# Patient Record
Sex: Male | Born: 1935 | Race: White | Hispanic: No | Marital: Married | Smoking: Former smoker
Health system: Southern US, Community
[De-identification: ages and names within clinical notes are randomized; demographics above are authoritative.]

## PROBLEM LIST (undated history)

## (undated) DIAGNOSIS — G4730 Sleep apnea, unspecified: Secondary | ICD-10-CM

## (undated) DIAGNOSIS — I219 Acute myocardial infarction, unspecified: Secondary | ICD-10-CM

## (undated) DIAGNOSIS — M1990 Unspecified osteoarthritis, unspecified site: Secondary | ICD-10-CM

## (undated) DIAGNOSIS — E039 Hypothyroidism, unspecified: Secondary | ICD-10-CM

## (undated) DIAGNOSIS — J9610 Chronic respiratory failure, unspecified whether with hypoxia or hypercapnia: Secondary | ICD-10-CM

## (undated) DIAGNOSIS — I5042 Chronic combined systolic (congestive) and diastolic (congestive) heart failure: Secondary | ICD-10-CM

## (undated) DIAGNOSIS — I447 Left bundle-branch block, unspecified: Secondary | ICD-10-CM

## (undated) DIAGNOSIS — Z8719 Personal history of other diseases of the digestive system: Secondary | ICD-10-CM

## (undated) DIAGNOSIS — J45909 Unspecified asthma, uncomplicated: Secondary | ICD-10-CM

## (undated) DIAGNOSIS — E785 Hyperlipidemia, unspecified: Secondary | ICD-10-CM

## (undated) DIAGNOSIS — I2510 Atherosclerotic heart disease of native coronary artery without angina pectoris: Secondary | ICD-10-CM

## (undated) DIAGNOSIS — I472 Ventricular tachycardia: Secondary | ICD-10-CM

## (undated) DIAGNOSIS — J449 Chronic obstructive pulmonary disease, unspecified: Secondary | ICD-10-CM

## (undated) DIAGNOSIS — J439 Emphysema, unspecified: Secondary | ICD-10-CM

## (undated) DIAGNOSIS — I255 Ischemic cardiomyopathy: Secondary | ICD-10-CM

## (undated) DIAGNOSIS — K529 Noninfective gastroenteritis and colitis, unspecified: Secondary | ICD-10-CM

## (undated) DIAGNOSIS — I10 Essential (primary) hypertension: Secondary | ICD-10-CM

## (undated) HISTORY — DX: Unspecified asthma, uncomplicated: J45.909

## (undated) HISTORY — DX: Sleep apnea, unspecified: G47.30

## (undated) HISTORY — PX: CATARACT EXTRACTION: SUR2

## (undated) HISTORY — PX: INSERT / REPLACE / REMOVE PACEMAKER: SUR710

## (undated) HISTORY — DX: Ischemic cardiomyopathy: I25.5

## (undated) HISTORY — DX: Unspecified osteoarthritis, unspecified site: M19.90

## (undated) HISTORY — DX: Hyperlipidemia, unspecified: E78.5

## (undated) HISTORY — DX: Emphysema, unspecified: J43.9

## (undated) HISTORY — PX: CARDIAC CATHETERIZATION: SHX172

## (undated) HISTORY — DX: Acute myocardial infarction, unspecified: I21.9

## (undated) HISTORY — DX: Chronic obstructive pulmonary disease, unspecified: J44.9

## (undated) HISTORY — DX: Essential (primary) hypertension: I10

## (undated) HISTORY — DX: Left bundle-branch block, unspecified: I44.7

---

## 1993-01-17 DIAGNOSIS — I219 Acute myocardial infarction, unspecified: Secondary | ICD-10-CM

## 1993-01-17 HISTORY — DX: Acute myocardial infarction, unspecified: I21.9

## 1993-01-17 HISTORY — PX: CORONARY ARTERY BYPASS GRAFT: SHX141

## 2000-01-18 HISTORY — PX: PACEMAKER INSERTION: SHX728

## 2007-01-18 HISTORY — PX: TOTAL KNEE ARTHROPLASTY: SHX125

## 2011-01-18 HISTORY — PX: REVERSE SHOULDER ARTHROPLASTY: SHX5054

## 2012-01-18 HISTORY — PX: REVERSE TOTAL SHOULDER ARTHROPLASTY: SHX2344

## 2014-06-05 LAB — CBC AND DIFFERENTIAL
HCT: 30 % — AB (ref 41–53)
Hemoglobin: 9.9 g/dL — AB (ref 13.5–17.5)
NEUTROS ABS: 4 /uL
Platelets: 192 10*3/uL (ref 150–399)
WBC: 6.8 10^3/mL

## 2014-06-23 LAB — CBC AND DIFFERENTIAL
HEMATOCRIT: 34 % — AB (ref 41–53)
HEMOGLOBIN: 11.1 g/dL — AB (ref 13.5–17.5)
Neutrophils Absolute: 5 /uL
PLATELETS: 204 10*3/uL (ref 150–399)
WBC: 7.2 10*3/mL

## 2014-08-29 LAB — BASIC METABOLIC PANEL
BUN: 31 mg/dL — AB (ref 4–21)
Creatinine: 1.4 mg/dL — AB (ref 0.6–1.3)
Glucose: 88 mg/dL
Potassium: 4.2 mmol/L (ref 3.4–5.3)
Sodium: 134 mmol/L — AB (ref 137–147)

## 2014-08-29 LAB — CBC AND DIFFERENTIAL
HEMATOCRIT: 28 % — AB (ref 41–53)
HEMOGLOBIN: 9.1 g/dL — AB (ref 13.5–17.5)
Platelets: 123 10*3/uL — AB (ref 150–399)

## 2014-09-05 LAB — BASIC METABOLIC PANEL
BUN: 31 mg/dL — AB (ref 4–21)
CREATININE: 1.4 mg/dL — AB (ref 0.6–1.3)
GLUCOSE: 88 mg/dL
POTASSIUM: 4.2 mmol/L (ref 3.4–5.3)
SODIUM: 134 mmol/L — AB (ref 137–147)

## 2014-10-20 ENCOUNTER — Encounter: Payer: Self-pay | Admitting: Family Medicine

## 2014-10-20 ENCOUNTER — Ambulatory Visit (INDEPENDENT_AMBULATORY_CARE_PROVIDER_SITE_OTHER): Payer: Medicare Other | Admitting: Family Medicine

## 2014-10-20 VITALS — BP 102/60 | HR 75 | Temp 98.0°F | Ht 65.75 in | Wt 165.5 lb

## 2014-10-20 DIAGNOSIS — M069 Rheumatoid arthritis, unspecified: Secondary | ICD-10-CM

## 2014-10-20 DIAGNOSIS — E039 Hypothyroidism, unspecified: Secondary | ICD-10-CM

## 2014-10-20 DIAGNOSIS — K625 Hemorrhage of anus and rectum: Secondary | ICD-10-CM | POA: Diagnosis not present

## 2014-10-20 DIAGNOSIS — Z23 Encounter for immunization: Secondary | ICD-10-CM

## 2014-10-20 DIAGNOSIS — M810 Age-related osteoporosis without current pathological fracture: Secondary | ICD-10-CM

## 2014-10-20 DIAGNOSIS — I10 Essential (primary) hypertension: Secondary | ICD-10-CM

## 2014-10-20 DIAGNOSIS — Z0000 Encounter for general adult medical examination without abnormal findings: Secondary | ICD-10-CM

## 2014-10-20 DIAGNOSIS — G4733 Obstructive sleep apnea (adult) (pediatric): Secondary | ICD-10-CM

## 2014-10-20 DIAGNOSIS — K219 Gastro-esophageal reflux disease without esophagitis: Secondary | ICD-10-CM

## 2014-10-20 DIAGNOSIS — E785 Hyperlipidemia, unspecified: Secondary | ICD-10-CM | POA: Diagnosis not present

## 2014-10-20 DIAGNOSIS — N183 Chronic kidney disease, stage 3 unspecified: Secondary | ICD-10-CM

## 2014-10-20 DIAGNOSIS — I2510 Atherosclerotic heart disease of native coronary artery without angina pectoris: Secondary | ICD-10-CM

## 2014-10-20 DIAGNOSIS — J449 Chronic obstructive pulmonary disease, unspecified: Secondary | ICD-10-CM

## 2014-10-20 DIAGNOSIS — M1000 Idiopathic gout, unspecified site: Secondary | ICD-10-CM

## 2014-10-20 DIAGNOSIS — E663 Overweight: Secondary | ICD-10-CM

## 2014-10-20 LAB — LIPID PANEL
CHOLESTEROL: 91 mg/dL (ref 0–200)
HDL: 40 mg/dL (ref >39.00)
LDL CALC: 28 mg/dL (ref 0–99)
NonHDL: 50.61
TRIGLYCERIDES: 112 mg/dL (ref 0.0–149.0)
Total CHOL/HDL Ratio: 2
VLDL: 22.4 mg/dL (ref 0.0–40.0)

## 2014-10-20 LAB — COMPREHENSIVE METABOLIC PANEL
ALBUMIN: 3.8 g/dL (ref 3.5–5.2)
ALK PHOS: 52 U/L (ref 39–117)
ALT: 23 U/L (ref 0–53)
AST: 29 U/L (ref 0–37)
BUN: 34 mg/dL — ABNORMAL HIGH (ref 6–23)
CO2: 29 mEq/L (ref 19–32)
Calcium: 9.8 mg/dL (ref 8.4–10.5)
Chloride: 101 mEq/L (ref 96–112)
Creatinine, Ser: 1.45 mg/dL (ref 0.40–1.50)
GFR: 49.77 mL/min — AB (ref >60.00)
Glucose, Bld: 106 mg/dL — ABNORMAL HIGH (ref 70–99)
POTASSIUM: 4.4 meq/L (ref 3.5–5.1)
Sodium: 139 mEq/L (ref 135–145)
TOTAL PROTEIN: 7 g/dL (ref 6.0–8.3)
Total Bilirubin: 0.4 mg/dL (ref 0.2–1.2)

## 2014-10-20 LAB — HEMOGLOBIN A1C: HEMOGLOBIN A1C: 5.5 % (ref 4.6–6.5)

## 2014-10-20 LAB — CBC
HCT: 31.3 % — ABNORMAL LOW (ref 39.0–52.0)
Hemoglobin: 10.1 g/dL — ABNORMAL LOW (ref 13.0–17.0)
MCHC: 32.4 g/dL (ref 30.0–36.0)
MCV: 96.7 fl (ref 78.0–100.0)
Platelets: 243 10*3/uL (ref 150.0–400.0)
RBC: 3.24 Mil/uL — AB (ref 4.22–5.81)
RDW: 15 % (ref 11.5–15.5)
WBC: 7.3 10*3/uL (ref 4.0–10.5)

## 2014-10-20 MED ORDER — FLUTICASONE-SALMETEROL 250-50 MCG/DOSE IN AEPB: 1.0000 | INHALATION_SPRAY | Freq: Two times a day (BID) | RESPIRATORY_TRACT | Status: DC

## 2014-10-20 NOTE — Progress Notes (Signed)
Pre visit review using our clinic review tool, if applicable. No additional management support is needed unless otherwise documented below in the visit note. 

## 2014-10-20 NOTE — Patient Instructions (Addendum)
It was nice to see you today.  We will be in contact regarding a referral to pulmonology and cardiology.  Please continue your current medications other than the Flovent. I have changed this to Advair.  Follow up in ~ 1-3 months.  Take care  Dr. Adriana Simas

## 2014-10-21 ENCOUNTER — Encounter: Payer: Self-pay | Admitting: *Deleted

## 2014-10-21 ENCOUNTER — Ambulatory Visit
Admission: RE | Admit: 2014-10-21 | Discharge: 2014-10-21 | Disposition: A | Discharge status: Home / Self Care | Payer: Medicare Other | Source: Ambulatory Visit | Attending: Cardiovascular Disease | Admitting: Cardiovascular Disease

## 2014-10-21 ENCOUNTER — Encounter: Payer: Self-pay | Admitting: Cardiovascular Disease

## 2014-10-21 ENCOUNTER — Ambulatory Visit (INDEPENDENT_AMBULATORY_CARE_PROVIDER_SITE_OTHER): Payer: Medicare Other | Admitting: Cardiovascular Disease

## 2014-10-21 VITALS — BP 84/54 | HR 70 | Ht 66.0 in | Wt 167.5 lb

## 2014-10-21 DIAGNOSIS — Z0000 Encounter for general adult medical examination without abnormal findings: Secondary | ICD-10-CM | POA: Insufficient documentation

## 2014-10-21 DIAGNOSIS — I2510 Atherosclerotic heart disease of native coronary artery without angina pectoris: Secondary | ICD-10-CM | POA: Diagnosis not present

## 2014-10-21 DIAGNOSIS — N183 Chronic kidney disease, stage 3 unspecified: Secondary | ICD-10-CM | POA: Insufficient documentation

## 2014-10-21 DIAGNOSIS — R0602 Shortness of breath: Secondary | ICD-10-CM | POA: Insufficient documentation

## 2014-10-21 DIAGNOSIS — I509 Heart failure, unspecified: Secondary | ICD-10-CM | POA: Insufficient documentation

## 2014-10-21 DIAGNOSIS — I255 Ischemic cardiomyopathy: Secondary | ICD-10-CM | POA: Insufficient documentation

## 2014-10-21 DIAGNOSIS — E785 Hyperlipidemia, unspecified: Secondary | ICD-10-CM

## 2014-10-21 DIAGNOSIS — I10 Essential (primary) hypertension: Secondary | ICD-10-CM | POA: Diagnosis not present

## 2014-10-21 DIAGNOSIS — K219 Gastro-esophageal reflux disease without esophagitis: Secondary | ICD-10-CM | POA: Insufficient documentation

## 2014-10-21 DIAGNOSIS — K625 Hemorrhage of anus and rectum: Secondary | ICD-10-CM | POA: Insufficient documentation

## 2014-10-21 DIAGNOSIS — J449 Chronic obstructive pulmonary disease, unspecified: Secondary | ICD-10-CM

## 2014-10-21 DIAGNOSIS — D500 Iron deficiency anemia secondary to blood loss (chronic): Secondary | ICD-10-CM

## 2014-10-21 DIAGNOSIS — M069 Rheumatoid arthritis, unspecified: Secondary | ICD-10-CM | POA: Insufficient documentation

## 2014-10-21 DIAGNOSIS — E039 Hypothyroidism, unspecified: Secondary | ICD-10-CM | POA: Insufficient documentation

## 2014-10-21 DIAGNOSIS — M109 Gout, unspecified: Secondary | ICD-10-CM | POA: Insufficient documentation

## 2014-10-21 DIAGNOSIS — M810 Age-related osteoporosis without current pathological fracture: Secondary | ICD-10-CM | POA: Insufficient documentation

## 2014-10-21 DIAGNOSIS — I951 Orthostatic hypotension: Secondary | ICD-10-CM

## 2014-10-21 NOTE — Assessment & Plan Note (Addendum)
Lipid Panel     Component Value Date/Time   CHOL 91 10/20/2014 1448   TRIG 112.0 10/20/2014 1448   HDL 40.00 10/20/2014 1448   CHOLHDL 2 10/20/2014 1448   VLDL 22.4 10/20/2014 1448   LDLCALC 28 10/20/2014 1448   Will continue current therapy: Simvastatin, Zetia. Will discuss the escalating therapy at follow-up visit if cholesterol continues to be low.

## 2014-10-21 NOTE — Assessment & Plan Note (Signed)
Patient has had recent hospitalization and workup on 09/25/14. He had flex sig which revealed internal & external hemorrhoids. No other abnormalities.  He did have a bleeding internal hemorrhoid which was cauterized.  Hemoglobin was stable. He was discharged home in stable condition.  He reports he's had one episode of rectal bleeding on Saturday which quickly resolved. CBC was obtained and hemoglobin was stable at 10.1 (was 10.3 per daughter while in Hospital).

## 2014-10-21 NOTE — Assessment & Plan Note (Signed)
Suggested he talk with Dr. Adriana Simas. He may want to start iron supplementation given his chronic blood loss from hemorrhoids. Bleeding has been notable over the past week. Suspect his anemia may be contributing to some of his weakness, shortness of breath

## 2014-10-21 NOTE — Assessment & Plan Note (Signed)
Patient with known COPD. Reports a one-year history of increasing dyspnea particularly with exertion. He appears euvolemic on exam today making underlying heart failure less likely etiology. Advised him to continue Spiriva and Singulair. Changing Flovent to Advair.

## 2014-10-21 NOTE — Progress Notes (Signed)
Patient ID: Tom Nguyen, male    DOB: 29-Jan-1932, 81 y.o.   MRN: [id number]  HPI Comments: Tom Nguyen is a 81 year old gentleman with long history of smoking starting at age 35, stopped in 1995, coronary artery disease, bypass surgery in 1995, ischemic cardiomyopathy, sleep apnea with CPAP, COPD, ICD 3 leads, underlying left bundle branch block with ejection fraction 28% who presents to establish care in the [facility].  He presents with wife and daughter on today's visit. They report he has had increasing shortness of breath since the end of August, beginning of September 79. There report he has no energy to go walking as he used to do. He reports having more shortness of breath, general weakness, fatigue. Also with anorexia, weight loss. Recent problems with hemorrhoids, most recent hematocrit 31. He was placed in the [place] in [place] and had hemorrhoids cauterized. He reports bleeding is back for the past month. He reports bowel movements are generally soft, takes Benefiber. He does report recent symptoms of near syncope while on the toilet having a bowel movement.  He has a [employer], political party. Notes provided shows ejection fraction by echocardiogram October 2010 was 35%. Ejection fraction in 2007 was 40% Stress test July 2006 showed inferior posterior lateral infarction with peri-infarct ischemia, ejection fraction 28%. Akinesis of the inferior myocardial segment and hypokinesis of the inferolateral segment. Prior study July 2004, no change  Notes indicate creatinine 1.65 in November 2015 with GFR 39 BNP 07/28/2014 was 200 EKG on today's visit shows a sense, V pace at 70 bpm  Other past medical history He does report that he was previously on warfarin. He does not take aspirin Family is unaware why he was on the warfarin in the past. They deny any atrial fibrillation or other arrhythmia. He had significant bruising, bleeding. Warfarin was held He reports he has not been  on aspirin for a long time but does not know why. Possibly from bleeding. Notes also indicate GI bleeding dates back to 2011. Perhaps this is why his anticoagulation was held      No Known Allergies  Current Outpatient Prescriptions on File Prior to Visit  Medication Sig Dispense Refill  . acetaminophen (TYLENOL) 500 MG tablet Take 500 mg by mouth every 6 (six) hours as needed.    Marland Kitchen alendronate (FOSAMAX) 70 MG tablet Take 500 mg by mouth.     Marland Kitchen allopurinol (ZYLOPRIM) 100 MG tablet Take 100 mg by mouth daily.     . carvedilol (COREG) 25 MG tablet Take 25 mg by mouth 2 (two) times daily with a meal.     . eplerenone (INSPRA) 50 MG tablet Take 50 mg by mouth daily.     . Fluticasone-Salmeterol (ADVAIR DISKUS) 250-50 MCG/DOSE AEPB Inhale 1 puff into the lungs 2 (two) times daily. 60 each 3  . folic acid (FOLVITE) 1 MG tablet Take 1 mg by mouth daily.     . furosemide (LASIX) 80 MG tablet Take 80 mg by mouth daily.     . hydroxychloroquine (PLAQUENIL) 200 MG tablet Take 200 mg by mouth 2 (two) times daily.     Marland Kitchen leflunomide (ARAVA) 20 MG tablet Take 20 mg by mouth daily.     Marland Kitchen levothyroxine (SYNTHROID, LEVOTHROID) 75 MCG tablet Take 75 mcg by mouth daily before breakfast.     . lisinopril (PRINIVIL,ZESTRIL) 40 MG tablet Take 40 mg by mouth daily.     . montelukast (SINGULAIR) 10 MG tablet Take 10 mg by mouth  at bedtime.     . Multiple Vitamins-Minerals (MULTIVITAMIN ADULT PO) Take by mouth daily.     . nitroGLYCERIN (NITROLINGUAL) 0.4 MG/SPRAY spray Place 1 spray under the tongue every 5 (five) minutes x 3 doses as needed for chest pain.    Marland Kitchen omeprazole (PRILOSEC) 20 MG capsule Take 20 mg by mouth daily.     . simvastatin (ZOCOR) 40 MG tablet Take 40 mg by mouth daily.     Marland Kitchen SPIRIVA HANDIHALER 18 MCG inhalation capsule Place 18 mcg into inhaler and inhale daily.     Marland Kitchen ZETIA 10 MG tablet Take 10 mg by mouth daily.      No current facility-administered medications on file prior to visit.     Past Medical History  Diagnosis Date  . Asthma   . Arthritis   . Blood in stool   . Emphysema of lung (HCC)   . Hypertension   . Hyperlipidemia   . Heart disease   . Thyroid disease   . Ischemic cardiomyopathy   . CHF (congestive heart failure) (HCC)   . LBBB (left bundle branch block)   . COPD (chronic obstructive pulmonary disease) (HCC)   . Sleep apnea   . MI (myocardial infarction) (HCC) 1995    Past Surgical History  Procedure Laterality Date  . Coronary artery bypass graft  1995    5 vessels  . Pacemaker insertion  2002  . Reverse shoulder arthroplasty Right 2013  . Reverse total shoulder arthroplasty Left 2014  . Total knee arthroplasty  2009    both knees  . Cardiac catheterization      Social History  reports that he quit smoking about 21 years ago. He has never used smokeless tobacco. He reports that he drinks alcohol. He reports that he does not use illicit drugs.  Family History family history includes Hyperlipidemia in his father; Hypertension in his father; Kidney disease in his father; Stroke in his father.   Review of Systems  Constitutional: Positive for fatigue and unexpected weight change.  Respiratory: Positive for shortness of breath.   Cardiovascular: Negative.   Gastrointestinal: Negative.   Musculoskeletal: Positive for gait problem.  Neurological: Positive for weakness.  Hematological: Negative.   Psychiatric/Behavioral: Negative.   All other systems reviewed and are negative.   BP 84/54 mmHg  Pulse 70  Ht 5\' 6"  (1.676 m)  Wt 167 lb 8 oz (75.978 kg)  BMI 27.05 kg/m2  SpO2 93%  Physical Exam  Constitutional: He is oriented to person, place, and time. He appears well-developed and well-nourished.  Pale-appearing gentleman in no distress, breathing comfortably  HENT:  Head: Normocephalic.  Nose: Nose normal.  Mouth/Throat: Oropharynx is clear and moist.  Eyes: Conjunctivae are normal. Pupils are equal, round, and reactive to  light.  Neck: Normal range of motion. Neck supple. No JVD present.  Cardiovascular: Normal rate, regular rhythm, normal heart sounds and intact distal pulses.  Exam reveals no gallop and no friction rub.   No murmur heard. Pulmonary/Chest: Effort normal. No respiratory distress. He has decreased breath sounds. He has no wheezes. He has no rales. He exhibits no tenderness.  Abdominal: Soft. Bowel sounds are normal. He exhibits no distension. There is no tenderness.  Musculoskeletal: Normal range of motion. He exhibits no edema or tenderness.  Lymphadenopathy:    He has no cervical adenopathy.  Neurological: He is alert and oriented to person, place, and time. Coordination normal.  Skin: Skin is warm and dry. No rash noted. No  erythema.  Psychiatric: He has a normal mood and affect. His behavior is normal. Judgment and thought content normal.

## 2014-10-21 NOTE — Assessment & Plan Note (Signed)
T dap and flu given today

## 2014-10-21 NOTE — Assessment & Plan Note (Signed)
Bleeding over the past week likely exacerbating his anemia. Mild to moderately low at 31 Will defer management to primary care. Unclear if he needs to be seen by GI for repeat banding Notes indicate GI bleeding has been a chronic issue dating back to 2011 at which time he had EGDs in the hospital

## 2014-10-21 NOTE — Assessment & Plan Note (Signed)
Etiology of his shortness of breath is unclear, likely multifactorial. Symptoms will be made worse by anemia, most recent hematocrit 31 in the setting of hemorrhoids and rectal bleeding over the past week. In the setting of underlying severe COPD, he'll be more symptomatic. Given elevated BUN and creatinine, clinical exam with no edema or abdominal bloating, less likely acute on chronic systolic CHF. With that said, if symptoms get worse, we have recommended he take extra Lasix after lunch on a periodic basis. We have ordered a chest x-ray today to rule out pleural effusion (some concern on the left base on clinical exam).  Recommended follow-up with pulmonology or with Dr. Adriana Simas to consider pulse dose prednisone.  We did ambulate him around the clinic today and he maintained oxygen saturation 93% or greater.   Last a we did discuss his underlying severe coronary artery disease. If symptoms get worse, or do not improve, stress testing could be considered, pharmacological Myoview. Echocardiogram could also  be repeated to confirm ejection fraction in the high 20-30 range.

## 2014-10-21 NOTE — Assessment & Plan Note (Signed)
Well controlled. Continue Coreg, Lisinopril.

## 2014-10-21 NOTE — Assessment & Plan Note (Signed)
Last stress test in 2006? Will review records again but would likely have a low threshold for repeat perfusion imaging to rule out ischemia if symptoms of shortness of breath persist. Blood pressure is low, we'll decrease lisinopril down to 20 mg daily. Recent symptoms of orthostasis while on the toilet. Systolic pressure on my check was 88 in a sitting position

## 2014-10-21 NOTE — Assessment & Plan Note (Addendum)
Recommended he stay on his current Lasix dose. BUN and creatinine above normal.  This would likely worsen with overdiuresis

## 2014-10-21 NOTE — Assessment & Plan Note (Signed)
No desaturations with walking. Maintain 93% on room air. Suggested he stay on his inhalers. Family wondering about prednisone for his shortness of breath. Will defer this to primary care and pulmonary.

## 2014-10-21 NOTE — Patient Instructions (Addendum)
Consider holding zetia (cholesterol is low) Add iron pill 1 to 2 a day, continue laxative  Please decrease the lisinopril down to 20 mg daily, at dinner  Monitor blood pressure Goal pressure >95 on the top number  For acute severe shortness of breath, Take an additional lasix/furosemide  CXR today or tomorrow  We will check oxygen with walking today  We will set up an appt with Dr. Graciela Husbands for new patient evaluation  Please call us if you have new issues that need to be addressed before your next appt.  Your physician wants you to follow-up in: 3 months.  You will receive a reminder letter in the mail two months in advance. If you don't receive a letter, please call our office to schedule the follow-up appointment.

## 2014-10-21 NOTE — Assessment & Plan Note (Signed)
He reports having some episodes of near syncope, particularly when on the toilet having a bowel movement. We'll decrease lisinopril as above. Recommended he call us if he continues to have symptoms. One option would be to decrease the carvedilol down to 12.5 mg twice a day. He has lost weight recently from anorexia which may have resulted in lower blood pressure.

## 2014-10-21 NOTE — Assessment & Plan Note (Signed)
Tolerating Fosamax. Will obtain records.

## 2014-10-21 NOTE — Assessment & Plan Note (Signed)
Cholesterol is very low on his current regimen. Likely exacerbated by anorexia. Recommended he hold the Zetia for now. This could be restarted at a later date once his appetite improves

## 2014-10-21 NOTE — Assessment & Plan Note (Signed)
Patient needs to see cardiology. Referral placed. Needed pain records regarding EF. Patient has pacemaker/ICD in place. He is tolerating current therapy well: Coreg, eplerenone, Lasix, lisinopril.

## 2014-10-21 NOTE — Progress Notes (Signed)
Subjective:  Patient ID: Tom Nguyen, male    DOB: [birth year]  Age: 81 y.o. MRN: [id number]  CC: Establish care  HPI Tom Nguyen is a 81 y.o. male presents to the clinic today to establish care.  Rectal bleeding  Patient reports he had rectal bleeding this past Saturday.  He reports that it is now resolved.  He was recently hospitalized for this was found to have internal and external hemorrhoids. Cauterization of internal hemorrhoid was done.  Remainder of flexible sigmoidoscopy was negative.  No current abdominal pain. No current bleeding. Patient declines rectal exam today as he tends to bleed following exam.  Shortness of breath  Patient has a history of tobacco abuse. See below.  Patient has known COPD.  He reports he's had shortness of breath particularly with exertion that is been worse for the past year.  No exacerbating factors. Relieved by rest.  No associated chest pain. Is compliant with current treatment regarding his CAD.  He endorses compliance with his inhaler medications: Spiriva, Flovent.  PMH, Surgical Hx, Family Hx, Social History reviewed and updated as below. Past Medical History  Diagnosis Date  . Asthma   . Arthritis   . Blood in stool   . Emphysema of lung (HCC)   . Hypertension   . Hyperlipidemia   . Heart disease   . Thyroid disease     Past Surgical History  Procedure Laterality Date  . Coronary artery bypass graft  1995    5 vessels  . Pacemaker insertion  2002  . Reverse shoulder arthroplasty Right 2013  . Reverse total shoulder arthroplasty Left 2014  . Total knee arthroplasty  2009    both knees    Family History  Problem Relation Age of Onset  . Hyperlipidemia Father   . Stroke Father   . Hypertension Father   . Kidney disease Father     Social History  Substance Use Topics  . Smoking status: Former Smoker    Quit date: 02/15/1993  . Smokeless tobacco: Never Used  . Alcohol Use: 0.0 - 0.6 oz/week    0-1  Standard drinks or equivalent per week    Review of Systems  Respiratory: Positive for shortness of breath.   Gastrointestinal: Positive for blood in stool.  Genitourinary:       Sexual difficulty.   All other systems negative. Objective:   Today's Vitals: BP 102/60 mmHg  Pulse 75  Temp(Src) 98 F (36.7 C) (Oral)  Ht 5' 5.75" (1.67 m)  Wt 165 lb 8 oz (75.07 kg)  BMI 26.92 kg/m2  SpO2 99%  Physical Exam  Constitutional: He is oriented to person, place, and time.  Tom Nguyen male in no acute distress.  HENT:  Head: Normocephalic and atraumatic.  Nose: Nose normal.  Mouth/Throat: Oropharynx is clear and moist. No oropharyngeal exudate.  Normal TM's bilaterally.   Eyes: Conjunctivae are normal. No scleral icterus.  Neck: Neck supple. No thyromegaly present.  Cardiovascular: Normal rate and regular rhythm.   Pulmonary/Chest: Effort normal and breath sounds normal. He has no wheezes. He has no rales.  Abdominal: Soft. He exhibits no distension. There is no tenderness. There is no rebound and no guarding.  Lymphadenopathy:    He has no cervical adenopathy.  Neurological: He is alert and oriented to person, place, and time.  Skin: Skin is warm and dry. No rash noted.  Psychiatric: He has a normal mood and affect.  Vitals reviewed.  Assessment & Plan:   Problem List  Items Addressed This Visit    Preventative health care    Tdap and flu given today.       HLD (hyperlipidemia)    Lipid Panel     Component Value Date/Time   CHOL 91 10/20/2014 1448   TRIG 112.0 10/20/2014 1448   HDL 40.00 10/20/2014 1448   CHOLHDL 2 10/20/2014 1448   VLDL 22.4 10/20/2014 1448   LDLCALC 28 10/20/2014 1448   Will continue current therapy: Simvastatin, Zetia. Will discuss the escalating therapy at follow-up visit if cholesterol continues to be low.        Relevant Medications   carvedilol (COREG) 25 MG tablet   eplerenone (INSPRA) 50 MG tablet   ZETIA 10 MG tablet   furosemide (LASIX)  80 MG tablet   lisinopril (PRINIVIL,ZESTRIL) 40 MG tablet   simvastatin (ZOCOR) 40 MG tablet   nitroGLYCERIN (NITROLINGUAL) 0.4 MG/SPRAY spray   HTN (hypertension)    Well controlled. Continue Coreg, Lisinopril.      Relevant Medications   carvedilol (COREG) 25 MG tablet   eplerenone (INSPRA) 50 MG tablet   ZETIA 10 MG tablet   furosemide (LASIX) 80 MG tablet   lisinopril (PRINIVIL,ZESTRIL) 40 MG tablet   simvastatin (ZOCOR) 40 MG tablet   nitroGLYCERIN (NITROLINGUAL) 0.4 MG/SPRAY spray   Other Relevant Orders   Comprehensive metabolic panel (Completed)   Hemoglobin A1c (Completed)   CKD (chronic kidney disease) stage 3, GFR 30-59 ml/min   GERD (gastroesophageal reflux disease)   Relevant Medications   omeprazole (PRILOSEC) 20 MG capsule   Osteoporosis    Tolerating Fosamax. Will obtain records.      Relevant Medications   alendronate (FOSAMAX) 70 MG tablet   CAD (coronary artery disease)    Patient needs to see cardiology. Referral placed. Needed pain records regarding EF. Patient has pacemaker/ICD in place. He is tolerating current therapy well: Coreg, eplerenone, Lasix, lisinopril.      Relevant Medications   carvedilol (COREG) 25 MG tablet   eplerenone (INSPRA) 50 MG tablet   ZETIA 10 MG tablet   furosemide (LASIX) 80 MG tablet   lisinopril (PRINIVIL,ZESTRIL) 40 MG tablet   simvastatin (ZOCOR) 40 MG tablet   nitroGLYCERIN (NITROLINGUAL) 0.4 MG/SPRAY spray   Other Relevant Orders   Ambulatory referral to Cardiology   COPD (chronic obstructive pulmonary disease) (HCC)    Patient with known COPD. Reports a one-year history of increasing dyspnea particularly with exertion. He appears euvolemic on exam today making underlying heart failure less likely etiology. Advised him to continue Spiriva and Singulair. Changing Flovent to Advair.      Relevant Medications   montelukast (SINGULAIR) 10 MG tablet   SPIRIVA HANDIHALER 18 MCG inhalation capsule    Fluticasone-Salmeterol (ADVAIR DISKUS) 250-50 MCG/DOSE AEPB   Other Relevant Orders   Ambulatory referral to Pulmonology   Rheumatoid arthritis (HCC)   Relevant Medications   allopurinol (ZYLOPRIM) 100 MG tablet   hydroxychloroquine (PLAQUENIL) 200 MG tablet   leflunomide (ARAVA) 20 MG tablet   acetaminophen (TYLENOL) 500 MG tablet   Rectal bleeding - Primary    Patient has had recent hospitalization and workup on 09/25/14. He had flex sig which revealed internal & external hemorrhoids. No other abnormalities.  He did have a bleeding internal hemorrhoid which was cauterized.  Hemoglobin was stable. He was discharged home in stable condition.  He reports he's had one episode of rectal bleeding on Saturday which quickly resolved. CBC was obtained and hemoglobin was stable at 10.1 (was 10.3 per  daughter while in Hospital).        Relevant Orders   CBC (Completed)    Other Visit Diagnoses    Hyperlipidemia        Relevant Medications    carvedilol (COREG) 25 MG tablet    eplerenone (INSPRA) 50 MG tablet    ZETIA 10 MG tablet    furosemide (LASIX) 80 MG tablet    lisinopril (PRINIVIL,ZESTRIL) 40 MG tablet    simvastatin (ZOCOR) 40 MG tablet    nitroGLYCERIN (NITROLINGUAL) 0.4 MG/SPRAY spray    Other Relevant Orders    Lipid panel (Completed)    Hemoglobin A1c (Completed)    Overweight (BMI 25.0-29.9)        Relevant Orders    Hemoglobin A1c (Completed)    Need for prophylactic vaccination with combined diphtheria-tetanus-pertussis (DTP) vaccine        Relevant Orders    Tdap vaccine greater than or equal to 7yo IM (Completed)    Encounter for immunization           Outpatient Encounter Prescriptions as of 10/20/2014  Medication Sig  . acetaminophen (TYLENOL) 500 MG tablet Take 500 mg by mouth every 6 (six) hours as needed.  Marland Kitchen alendronate (FOSAMAX) 70 MG tablet 500 mg.   . allopurinol (ZYLOPRIM) 100 MG tablet   . carvedilol (COREG) 25 MG tablet   . eplerenone (INSPRA) 50 MG  tablet   . Fluticasone-Salmeterol (ADVAIR DISKUS) 250-50 MCG/DOSE AEPB Inhale 1 puff into the lungs 2 (two) times daily.  . folic acid (FOLVITE) 1 MG tablet   . furosemide (LASIX) 80 MG tablet   . hydroxychloroquine (PLAQUENIL) 200 MG tablet   . leflunomide (ARAVA) 20 MG tablet   . levothyroxine (SYNTHROID, LEVOTHROID) 75 MCG tablet   . lisinopril (PRINIVIL,ZESTRIL) 40 MG tablet   . montelukast (SINGULAIR) 10 MG tablet   . Multiple Vitamins-Minerals (MULTIVITAMIN ADULT PO) Take by mouth.  . nitroGLYCERIN (NITROLINGUAL) 0.4 MG/SPRAY spray Place 1 spray under the tongue every 5 (five) minutes x 3 doses as needed for chest pain.  Marland Kitchen omeprazole (PRILOSEC) 20 MG capsule   . simvastatin (ZOCOR) 40 MG tablet   . SPIRIVA HANDIHALER 18 MCG inhalation capsule   . ZETIA 10 MG tablet   . [DISCONTINUED] FLOVENT HFA 220 MCG/ACT inhaler    No facility-administered encounter medications on file as of 10/20/2014.    Follow-up: 1-3 months.    [name] DO

## 2014-10-22 ENCOUNTER — Ambulatory Visit: Payer: Medicare Other | Admitting: Family Medicine

## 2014-10-22 ENCOUNTER — Telehealth: Payer: Self-pay

## 2014-10-22 DIAGNOSIS — R0602 Shortness of breath: Secondary | ICD-10-CM

## 2014-10-22 DIAGNOSIS — Z951 Presence of aortocoronary bypass graft: Secondary | ICD-10-CM

## 2014-10-22 DIAGNOSIS — R531 Weakness: Secondary | ICD-10-CM

## 2014-10-22 DIAGNOSIS — Z4502 Encounter for adjustment and management of automatic implantable cardiac defibrillator: Secondary | ICD-10-CM

## 2014-10-22 DIAGNOSIS — I25700 Atherosclerosis of coronary artery bypass graft(s), unspecified, with unstable angina pectoris: Secondary | ICD-10-CM

## 2014-10-22 NOTE — Telephone Encounter (Signed)
Spoke w/ Tom Nguyen.  Advised him that Dr. Mariah Milling sent me a note that PCP wants him to have an ECHO and Lexiscan myoview. Tom Nguyen is agreeable to scheduling these.   Advised him that I will set up his Lexi when he comes in for ECHO.

## 2014-10-23 ENCOUNTER — Ambulatory Visit (INDEPENDENT_AMBULATORY_CARE_PROVIDER_SITE_OTHER): Payer: Medicare Other | Admitting: Family Medicine

## 2014-10-23 ENCOUNTER — Ambulatory Visit
Admission: RE | Admit: 2014-10-23 | Discharge: 2014-10-23 | Disposition: A | Discharge status: Home / Self Care | Payer: Medicare Other | Source: Ambulatory Visit | Attending: Family Medicine | Admitting: Family Medicine

## 2014-10-23 ENCOUNTER — Encounter: Payer: Self-pay | Admitting: Family Medicine

## 2014-10-23 VITALS — BP 110/62 | HR 77 | Temp 97.9°F | Ht 66.0 in | Wt 168.2 lb

## 2014-10-23 DIAGNOSIS — M5134 Other intervertebral disc degeneration, thoracic region: Secondary | ICD-10-CM | POA: Insufficient documentation

## 2014-10-23 DIAGNOSIS — M546 Pain in thoracic spine: Secondary | ICD-10-CM | POA: Insufficient documentation

## 2014-10-23 DIAGNOSIS — D649 Anemia, unspecified: Secondary | ICD-10-CM

## 2014-10-23 DIAGNOSIS — D500 Iron deficiency anemia secondary to blood loss (chronic): Secondary | ICD-10-CM | POA: Diagnosis not present

## 2014-10-23 DIAGNOSIS — M549 Dorsalgia, unspecified: Secondary | ICD-10-CM | POA: Insufficient documentation

## 2014-10-23 DIAGNOSIS — I255 Ischemic cardiomyopathy: Secondary | ICD-10-CM

## 2014-10-23 DIAGNOSIS — R0602 Shortness of breath: Secondary | ICD-10-CM

## 2014-10-23 LAB — FERRITIN: FERRITIN: 282.5 ng/mL (ref 22.0–322.0)

## 2014-10-23 LAB — IRON AND TIBC
%SAT: 12 % — AB (ref 15–60)
Iron: 32 ug/dL — ABNORMAL LOW (ref 50–180)
TIBC: 276 ug/dL (ref 250–425)
UIBC: 244 ug/dL (ref 125–400)

## 2014-10-23 MED ORDER — FERROUS SULFATE 325 (65 FE) MG PO TABS: 325.0000 mg | ORAL_TABLET | Freq: Three times a day (TID) | ORAL | Status: DC

## 2014-10-23 MED ORDER — TRAMADOL HCL 50 MG PO TABS
50.0000 mg | ORAL_TABLET | Freq: Three times a day (TID) | ORAL | Status: DC | PRN
Start: 2014-10-23 — End: 2015-04-13

## 2014-10-23 MED ORDER — PREDNISONE 50 MG PO TABS: 50.0000 mg | ORAL_TABLET | Freq: Every day | ORAL | Status: DC

## 2014-10-23 NOTE — Assessment & Plan Note (Signed)
I agree with cardiology that this is likely multifactorial: COPD, Anemia, Ischemic cardiomyopathy. Patient dyspneic today. Starting on burst of prednisone and escalating COPD therapy (changing Flovent to Advair). Patient has appt with Pulmonary later this month. Spoke with Dr. Mariah Milling, cardiology, today. He will arranged stress and echo for ischemic evaluation.

## 2014-10-23 NOTE — Progress Notes (Signed)
Pre visit review using our clinic review tool, if applicable. No additional management support is needed unless otherwise documented below in the visit note. 

## 2014-10-23 NOTE — Assessment & Plan Note (Signed)
Xray obtained today. I personally reviewed the images.  No evidence of compression fracture.  Mild degenerative changes noted. PRN Tramadol for pain.

## 2014-10-23 NOTE — Assessment & Plan Note (Signed)
Checking iron studies today. Starting on Iron therapy. Will refer to GI if bleeding returns. He has had no further bleeding episodes.

## 2014-10-23 NOTE — Progress Notes (Signed)
Subjective:  Patient ID: Tom Nguyen, male    DOB: 10-15-1932  Age: 81 y.o. MRN: [id number]  CC: Back pain   HPI: 81 year male with a PMH of Ischemic cardiomyopathy, CKD, COPD, HTN, HLD presents with complaints of back pain.  He also presents for follow up of SOB (he has recently seen cardiology; and Dr. Mariah Milling wanted me to follow up his SOB).   1) Back pain  Started approximately 1 month ago.  Located at a single location of the lower thoracic spine.  No  recent fall, trauma, injury.  Improves with rest and worsens with activity.  No interventions tried.  No other associated symptoms.   2) SOB  Patient continues to have dyspnea and fatigue.  He was recently seen by Cardiology.   Better at rest. Worsens with exertion.  He was able to maintain oxygent saturation with activity at the cardiology office.   No reported cough/sputum production.  No fever.  Social Hx   Social History   Social History  . Marital Status: Married    Spouse Name: N/A  . Number of Children: N/A  . Years of Education: N/A   Social History Main Topics  . Smoking status: Former Smoker    Quit date: 02/15/1993  . Smokeless tobacco: Never Used  . Alcohol Use: 0.0 - 0.6 oz/week    0-1 Standard drinks or equivalent per week  . Drug Use: No  . Sexual Activity: Not Asked   Other Topics Concern  . None   Social History Narrative   Review of Systems  Respiratory: Positive for shortness of breath.   Musculoskeletal: Positive for back pain.   Objective:  BP 110/62 mmHg  Pulse 77  Temp(Src) 97.9 F (36.6 C) (Oral)  Ht 5\' 6"  (1.676 m)  Wt 168 lb 4 oz (76.318 kg)  BMI 27.17 kg/m2  SpO2 94%  BP/Weight 10/23/2014 10/21/2014 10/20/2014  Systolic BP 110 84 102  Diastolic BP 62 54 60  Wt. (Lbs) 168.25 167.5 165.5  BMI 27.17 27.05 26.92   Physical Exam  Constitutional:  Chronically ill appearing male; Pale; appears to be slightly dyspneic but in no acute distress.  Cardiovascular:  Normal rate and regular rhythm.   Pulmonary/Chest: Effort normal.  Coarse breath sounds noted.   Abdominal: Soft. He exhibits no distension. There is no tenderness.  Musculoskeletal:  Back: Thoracic kyphosis noted.  Slightly tender to palpation over the spinous process ~ T5-6.   Neurological: He is alert.  Psychiatric: He has a normal mood and affect.  Vitals reviewed.  Lab Results  Component Value Date   WBC 7.3 10/20/2014   HGB 10.1* 10/20/2014   HCT 31.3* 10/20/2014   PLT 243.0 10/20/2014   GLUCOSE 106* 10/20/2014   CHOL 91 10/20/2014   TRIG 112.0 10/20/2014   HDL 40.00 10/20/2014   LDLCALC 28 10/20/2014   ALT 23 10/20/2014   AST 29 10/20/2014   NA 139 10/20/2014   K 4.4 10/20/2014   CL 101 10/20/2014   CREATININE 1.45 10/20/2014   BUN 34* 10/20/2014   CO2 29 10/20/2014   HGBA1C 5.5 10/20/2014   Assessment & Plan:   Problem List Items Addressed This Visit    Back pain    Xray obtained today. I personally reviewed the images.  No evidence of compression fracture.  Mild degenerative changes noted. PRN Tramadol for pain.      Relevant Medications   traMADol (ULTRAM) 50 MG tablet   predniSONE (DELTASONE) 50 MG tablet  Other Relevant Orders   DG Thoracic Spine 2 View (Completed)   Blood loss anemia    Checking iron studies today. Starting on Iron therapy. Will refer to GI if bleeding returns. He has had no further bleeding episodes.      Relevant Medications   ferrous sulfate 325 (65 FE) MG tablet   Shortness of breath    I agree with cardiology that this is likely multifactorial: COPD, Anemia, Ischemic cardiomyopathy. Patient dyspneic today. Starting on burst of prednisone and escalating COPD therapy (changing Flovent to Advair). Patient has appt with Pulmonary later this month. Spoke with Dr. Mariah Milling, cardiology, today. He will arranged stress and echo for ischemic evaluation.       Other Visit Diagnoses    Anemia, unspecified anemia type    -  Primary      Relevant Medications    ferrous sulfate 325 (65 FE) MG tablet    Other Relevant Orders    Iron and TIBC    Ferritin (Completed)       Meds ordered this encounter  Medications  . ferrous sulfate 325 (65 FE) MG tablet    Sig: Take 1 tablet (325 mg total) by mouth 3 (three) times daily.    Dispense:  90 tablet    Refill:  2  . traMADol (ULTRAM) 50 MG tablet    Sig: Take 1 tablet (50 mg total) by mouth every 8 (eight) hours as needed.    Dispense:  60 tablet    Refill:  1  . predniSONE (DELTASONE) 50 MG tablet    Sig: Take 1 tablet (50 mg total) by mouth daily.    Dispense:  5 tablet    Refill:  0    Follow-up: 1 week  Everlene Other, DO

## 2014-10-23 NOTE — Patient Instructions (Signed)
Take the prednisone daily for 5 days.  Stop flovent and start Advair.  Take the iron 3 times daily.  Follow up with me in ~ 1 month.  Be sure to see pulmonology.  We will call with your xray results.  Use the Tramadol as needed.  Take care  Dr. Adriana Simas

## 2014-10-27 ENCOUNTER — Telehealth: Payer: Self-pay

## 2014-10-27 NOTE — Telephone Encounter (Signed)
Spoke w/ pt.  He is sched for Lexi Monday, Oct 17 @ 8:00, he understands to arrive @ 7:45. Reviewed instructions w/ him, her understands to be NPO after midnite, no caffeine for 24 hrs, and to hold coreg the night before and am of procedure, hold lisinopril that am.  Advised him that I am mailing him these instructions, as well.  He is appreciative and will call back w/ any questions or concerns.

## 2014-10-27 NOTE — Telephone Encounter (Signed)
Left message for pt that Dr. Mariah Milling would like to go ahead and schedule lexi before his ECHO.  Asked him to call back to set this up.

## 2014-11-03 ENCOUNTER — Telehealth: Payer: Self-pay | Admitting: *Deleted

## 2014-11-03 NOTE — Telephone Encounter (Signed)
Pt calling stating he fell yesterday and will not be able to make his stress test this morning.

## 2014-11-03 NOTE — Telephone Encounter (Signed)
Patient stopped by to state that he needs a Rx for his leflunomide 20mg , and for his lisinopril he should be taking 20 mg instead of 40 mg. He also need a Rx for Advair

## 2014-11-03 NOTE — Telephone Encounter (Signed)
Left message for pt to call back  °

## 2014-11-04 ENCOUNTER — Other Ambulatory Visit: Payer: Self-pay

## 2014-11-04 ENCOUNTER — Telehealth: Payer: Self-pay

## 2014-11-04 MED ORDER — LISINOPRIL 20 MG PO TABS: 20.0000 mg | ORAL_TABLET | Freq: Every day | ORAL | Status: DC

## 2014-11-04 MED ORDER — FLUTICASONE-SALMETEROL 250-50 MCG/DOSE IN AEPB: 1.0000 | INHALATION_SPRAY | Freq: Two times a day (BID) | RESPIRATORY_TRACT | Status: DC

## 2014-11-04 NOTE — Telephone Encounter (Signed)
Spoke w/ pt.  He states that he cut his eyebrow when he fell, but is otherwise ok. Asked if he feels ready to resched his lexi, he states that he does not have his calendar w/ him and will call back when he is ready.

## 2014-11-04 NOTE — Telephone Encounter (Signed)
advair was refilled. Pt needs to talk to cardiologist about his leflunomide.

## 2014-11-04 NOTE — Telephone Encounter (Signed)
Left a message for patient to follow up with cardiology for refills on Lisinopril and Leflunomide.  Refilled Advair.

## 2014-11-04 NOTE — Telephone Encounter (Signed)
Pt was told his lisinopril was okay to refill and the doctor will be refilling medication.

## 2014-11-04 NOTE — Telephone Encounter (Signed)
Pt is also on inspra. A warning came up when refilling lisinopril. Please advise?

## 2014-11-07 ENCOUNTER — Encounter: Payer: Self-pay | Admitting: Pulmonary Disease

## 2014-11-07 ENCOUNTER — Ambulatory Visit (INDEPENDENT_AMBULATORY_CARE_PROVIDER_SITE_OTHER): Payer: Medicare Other | Admitting: Pulmonary Disease

## 2014-11-07 VITALS — BP 90/52 | HR 74 | Wt 165.0 lb

## 2014-11-07 DIAGNOSIS — I25119 Atherosclerotic heart disease of native coronary artery with unspecified angina pectoris: Secondary | ICD-10-CM

## 2014-11-07 DIAGNOSIS — M40294 Other kyphosis, thoracic region: Secondary | ICD-10-CM | POA: Diagnosis not present

## 2014-11-07 DIAGNOSIS — R0600 Dyspnea, unspecified: Secondary | ICD-10-CM | POA: Diagnosis not present

## 2014-11-07 DIAGNOSIS — R5381 Other malaise: Secondary | ICD-10-CM | POA: Diagnosis not present

## 2014-11-07 DIAGNOSIS — Z87891 Personal history of nicotine dependence: Secondary | ICD-10-CM | POA: Diagnosis not present

## 2014-11-07 DIAGNOSIS — I255 Ischemic cardiomyopathy: Secondary | ICD-10-CM | POA: Diagnosis not present

## 2014-11-07 MED ORDER — UMECLIDINIUM-VILANTEROL 62.5-25 MCG/INH IN AEPB: 1.0000 | INHALATION_SPRAY | Freq: Every day | RESPIRATORY_TRACT | Status: DC

## 2014-11-07 NOTE — Patient Instructions (Signed)
You have shortness of breath that is probably due to multiple factors including possible COPD/emphysema Stop Flovent, Spiriva and Singulair Trial of Anoro inhaler Follow up in 3-4 weeks and we will make a measurement of your lung function ath that time

## 2014-11-07 NOTE — Progress Notes (Signed)
PULMONARY CONSULT NOTE  Date of consult: 11/07/14 Reason for consultation: Dyspnea    HPI:  81 yr old M with severe and progressive DOE since 1995, the time of his CABG. His SOB has little day to day variability. He denies cough, sputum production, hemoptysis, CP, LE edema and calf tenderness. At present, he is limited to alking approx 20-30 feet before needing to rest. His medciations include multiple inhaler medications. He does not believe that any of these have made any difference  Past Medical History  Diagnosis Date  . Asthma   . Arthritis   . Blood in stool   . Emphysema of lung (HCC)   . Hypertension   . Hyperlipidemia   . Heart disease   . Thyroid disease   . Ischemic cardiomyopathy   . CHF (congestive heart failure) (HCC)   . LBBB (left bundle branch block)   . COPD (chronic obstructive pulmonary disease) (HCC)   . Sleep apnea   . MI (myocardial infarction) (HCC) 1995   Past Surgical History  Procedure Laterality Date  . Coronary artery bypass graft  1995    5 vessels  . Pacemaker insertion  2002  . Reverse shoulder arthroplasty Right 2013  . Reverse total shoulder arthroplasty Left 2014  . Total knee arthroplasty  2009    both knees  . Cardiac catheterization    History of traumatic hemothorax (after fall) approx 5 yrs ago  MEDICATIONS:   Current outpatient prescriptions:  .  acetaminophen (TYLENOL) 500 MG tablet, Take 500 mg by mouth every 6 (six) hours as needed., Disp: , Rfl:  .  alendronate (FOSAMAX) 70 MG tablet, Take 500 mg by mouth. , Disp: , Rfl:  .  allopurinol (ZYLOPRIM) 100 MG tablet, Take 100 mg by mouth daily. , Disp: , Rfl:  .  carvedilol (COREG) 25 MG tablet, Take 25 mg by mouth 2 (two) times daily with a meal. , Disp: , Rfl:  .  eplerenone (INSPRA) 50 MG tablet, Take 50 mg by mouth daily. , Disp: , Rfl:  .  ferrous sulfate 325 (65 FE) MG tablet, Take 1 tablet (325 mg total) by mouth 3 (three) times daily., Disp: 90 tablet, Rfl: 2 .   fluticasone (FLOVENT HFA) 220 MCG/ACT inhaler, Inhale 1 puff into the lungs as needed., Disp: , Rfl:  .  Fluticasone-Salmeterol (ADVAIR DISKUS) 250-50 MCG/DOSE AEPB, Inhale 1 puff into the lungs 2 (two) times daily., Disp: 60 each, Rfl: 3 .  folic acid (FOLVITE) 1 MG tablet, Take 1 mg by mouth daily. , Disp: , Rfl:  .  furosemide (LASIX) 80 MG tablet, Take 80 mg by mouth daily. , Disp: , Rfl:  .  hydroxychloroquine (PLAQUENIL) 200 MG tablet, Take 200 mg by mouth 2 (two) times daily. , Disp: , Rfl:  .  leflunomide (ARAVA) 20 MG tablet, Take 20 mg by mouth daily. , Disp: , Rfl:  .  levothyroxine (SYNTHROID, LEVOTHROID) 75 MCG tablet, Take 75 mcg by mouth daily before breakfast. , Disp: , Rfl:  .  lisinopril (PRINIVIL,ZESTRIL) 20 MG tablet, Take 1 tablet (20 mg total) by mouth daily., Disp: 90 tablet, Rfl: 1 .  montelukast (SINGULAIR) 10 MG tablet, Take 10 mg by mouth at bedtime. , Disp: , Rfl:  .  Multiple Vitamins-Minerals (MULTIVITAMIN ADULT PO), Take by mouth daily. , Disp: , Rfl:  .  nitroGLYCERIN (NITROLINGUAL) 0.4 MG/SPRAY spray, Place 1 spray under the tongue every 5 (five) minutes x 3 doses as needed for chest pain.,  Disp: , Rfl:  .  omeprazole (PRILOSEC) 20 MG capsule, Take 20 mg by mouth daily. , Disp: , Rfl:  .  simvastatin (ZOCOR) 40 MG tablet, Take 40 mg by mouth daily. , Disp: , Rfl:  .  SPIRIVA HANDIHALER 18 MCG inhalation capsule, Place 18 mcg into inhaler and inhale daily. , Disp: , Rfl:  .  traMADol (ULTRAM) 50 MG tablet, Take 1 tablet (50 mg total) by mouth every 8 (eight) hours as needed., Disp: 60 tablet, Rfl: 1 .  Umeclidinium-Vilanterol (ANORO ELLIPTA) 62.5-25 MCG/INH AEPB, Inhale 1 puff into the lungs daily., Disp: 1 each, Rfl: 11 .  Umeclidinium-Vilanterol 62.5-25 MCG/INH AEPB, Inhale 1 puff into the lungs daily., Disp: 14 each, Rfl: 0  Social History   Social History  . Marital Status: Married    Spouse Name: N/A  . Number of Children: N/A  . Years of Education: N/A    Occupational History  . Not on file.   Social History Main Topics  . Smoking status: Former Smoker    Quit date: 02/15/1993  . Smokeless tobacco: Never Used  . Alcohol Use: 0.0 - 0.6 oz/week    0-1 Standard drinks or equivalent per week  . Drug Use: No  . Sexual Activity: Not on file   Other Topics Concern  . Not on file   Social History Narrative    Family History  Problem Relation Age of Onset  . Hyperlipidemia Father   . Stroke Father   . Hypertension Father   . Kidney disease Father     Review of Systems  Constitutional: Negative for fever, chills, weight loss, malaise/fatigue and diaphoresis.  HENT: Negative.   Eyes: Negative.   Respiratory: Negative for cough, hemoptysis, sputum production and wheezing.   Cardiovascular: Negative for chest pain, palpitations, orthopnea, leg swelling and PND.  Gastrointestinal: Negative.   Musculoskeletal: Positive for falls.  Skin: Negative.   Neurological: Positive for weakness. Negative for dizziness, focal weakness and loss of consciousness.     Filed Vitals:   11/07/14 1026  BP: 90/52  Pulse: 74  Weight: 74.844 kg (165 lb)  SpO2: 97%    EXAM:  Gen: frail, NAD, difficulty stepping up to exam table HEENT: All WNL Neck: No LAN, no TM Lungs: normal percussion note throughout, no wheezes Cardiovascular: reg, no M Abdomen: NABS, soft Ext: no edema Neuro: no focal deficits, diffusely weak  DATA:  CXR (10/21/14): moderate thoracic kyphosis. AICD in place. NACPD  IMPRESSION:   Dyspnea -  etiology is unclear and likely multifactorial. There might be a component of COPD given prior smoking history. More likely, this is cardiac and/or profound deconditioning Former smoker CAD, S/P CABG 1995 Thoracic kyphosis Physical deconditioning  I do not see a recent ehcocardiogram  PLAN:  Stop Flovent, Spiriva and Singulair Trial of Anoro inhaler Follow up in 3-4 weeks. Spirometry @ that time Would consider re-eval of LV  function with Echocardiogram   Billy Fischer, MD PCCM service Mobile [phone] Pager [phone]

## 2014-11-10 ENCOUNTER — Encounter: Payer: Self-pay | Admitting: Family Medicine

## 2014-11-11 ENCOUNTER — Encounter: Payer: Self-pay | Admitting: Family Medicine

## 2014-11-13 ENCOUNTER — Telehealth: Payer: Self-pay | Admitting: Pulmonary Disease

## 2014-11-13 MED ORDER — UMECLIDINIUM-VILANTEROL 62.5-25 MCG/INH IN AEPB: 1.0000 | INHALATION_SPRAY | Freq: Every day | RESPIRATORY_TRACT | Status: DC

## 2014-11-13 NOTE — Telephone Encounter (Signed)
Pt informed rx sent to [facility] [address]. Nothing further needed.

## 2014-11-14 ENCOUNTER — Ambulatory Visit (INDEPENDENT_AMBULATORY_CARE_PROVIDER_SITE_OTHER): Payer: Medicare Other

## 2014-11-14 ENCOUNTER — Telehealth: Payer: Self-pay | Admitting: *Deleted

## 2014-11-14 ENCOUNTER — Other Ambulatory Visit: Payer: Self-pay

## 2014-11-14 DIAGNOSIS — Z0389 Encounter for observation for other suspected diseases and conditions ruled out: Secondary | ICD-10-CM

## 2014-11-14 DIAGNOSIS — Z951 Presence of aortocoronary bypass graft: Secondary | ICD-10-CM | POA: Diagnosis not present

## 2014-11-14 DIAGNOSIS — R531 Weakness: Secondary | ICD-10-CM | POA: Diagnosis not present

## 2014-11-14 DIAGNOSIS — Z4502 Encounter for adjustment and management of automatic implantable cardiac defibrillator: Secondary | ICD-10-CM

## 2014-11-14 DIAGNOSIS — I25700 Atherosclerosis of coronary artery bypass graft(s), unspecified, with unstable angina pectoris: Secondary | ICD-10-CM | POA: Diagnosis not present

## 2014-11-14 DIAGNOSIS — R0602 Shortness of breath: Secondary | ICD-10-CM | POA: Diagnosis not present

## 2014-11-14 NOTE — Telephone Encounter (Signed)
Pt came in today for Echo. He wanted to rsch his stress test he had to cancel when he fell. Please call back.

## 2014-11-14 NOTE — Telephone Encounter (Signed)
Left message for pt to call back  °

## 2014-11-17 NOTE — Telephone Encounter (Signed)
Spoke w/ pt.  He is sched for Lexi 11/24/14 @ 7:30. Pt verbalizes understanding [facility] 7:[facility], and to be NPO after midnite, no caffeine for 24 hrs, and to hold coreg the night before and am of procedure, hold lisinopril that am.  Advised him that these instructions were mailed to him on 10/27/14, he states that he is sure he can find them.  Asked him to call back if he will be unable to keep this appt.

## 2014-11-20 ENCOUNTER — Encounter: Payer: Self-pay | Admitting: Family Medicine

## 2014-11-20 ENCOUNTER — Ambulatory Visit (INDEPENDENT_AMBULATORY_CARE_PROVIDER_SITE_OTHER): Payer: Medicare Other | Admitting: Family Medicine

## 2014-11-20 VITALS — BP 124/64 | HR 71 | Temp 97.7°F | Ht 66.0 in | Wt 167.3 lb

## 2014-11-20 DIAGNOSIS — R0600 Dyspnea, unspecified: Secondary | ICD-10-CM

## 2014-11-20 DIAGNOSIS — I255 Ischemic cardiomyopathy: Secondary | ICD-10-CM | POA: Diagnosis not present

## 2014-11-20 DIAGNOSIS — I10 Essential (primary) hypertension: Secondary | ICD-10-CM

## 2014-11-20 NOTE — Progress Notes (Signed)
Pre visit review using our clinic review tool, if applicable. No additional management support is needed unless otherwise documented below in the visit note. 

## 2014-11-20 NOTE — Patient Instructions (Signed)
Continue your current medications.  Be sure to get your breathing testing as well as your stress test.  Follow up in Jan.  Take care  Dr. Adriana Simas

## 2014-11-21 DIAGNOSIS — R0600 Dyspnea, unspecified: Secondary | ICD-10-CM | POA: Insufficient documentation

## 2014-11-21 NOTE — Assessment & Plan Note (Signed)
Recent improvement with switch to Anoro. Recent echo on 10/28 revealed EF of 45-50%. Advised close follow up with cardiology for stress test. Has upcoming PFT's as well. Will continue to monitor closely. I am concerned about worsening CAD given PMH.

## 2014-11-21 NOTE — Assessment & Plan Note (Signed)
Stable. Continue current meds.   

## 2014-11-21 NOTE — Progress Notes (Signed)
   Subjective:  Patient ID: Tom Nguyen, male    DOB: 1932/01/22  Age: 81 y.o. MRN: [id number]  CC: Follow up  HPI:  81 year old male accompanied past medical history presents for follow-up.  Dyspnea  Patient presents for follow-up regarding dyspnea.   Patient is now seen pulmonology Dr. Sung Amabile and his cardiologist.  He has had recent echocardiogram and is scheduled for stress test.  Pulmonology has discontinued Flovent, Singulair, and spiriva. He is now on Anoro.  He states that since the change she's had some slight improvement in his shortness of breath.  No reports of cough or sputum production.  No reports of chest pain.  HTN  Patient has had some recent hypotension.  BP stable today.  Social Hx   Social History   Social History  . Marital Status: Married    Spouse Name: N/A  . Number of Children: N/A  . Years of Education: N/A   Social History Main Topics  . Smoking status: Former Smoker    Quit date: 02/15/1993  . Smokeless tobacco: Never Used  . Alcohol Use: 0.0 - 0.6 oz/week    0-1 Standard drinks or equivalent per week  . Drug Use: No  . Sexual Activity: Not Asked   Other Topics Concern  . None   Social History Narrative   Review of Systems  Constitutional: Negative for fever.  Respiratory: Positive for shortness of breath.     Objective:  BP 124/64 mmHg  Pulse 71  Temp(Src) 97.7 F (36.5 C) (Oral)  Ht 5\' 6"  (1.676 m)  Wt 167 lb 5 oz (75.892 kg)  BMI 27.02 kg/m2  SpO2 90%  BP/Weight 11/20/2014 11/07/2014 10/23/2014  Systolic BP 124 90 110  Diastolic BP 64 52 62  Wt. (Lbs) 167.31 165 168.25  BMI 27.02 26.64 27.17    Physical Exam  Constitutional: No distress.  Cardiovascular: Normal rate and regular rhythm.   Pulmonary/Chest:  Bibasilar rales noted.   Neurological: He is alert.  Psychiatric: He has a normal mood and affect.  Vitals reviewed.  Lab Results  Component Value Date   WBC 7.3 10/20/2014   HGB 10.1* 10/20/2014   HCT 31.3* 10/20/2014   PLT 243.0 10/20/2014   GLUCOSE 106* 10/20/2014   CHOL 91 10/20/2014   TRIG 112.0 10/20/2014   HDL 40.00 10/20/2014   LDLCALC 28 10/20/2014   ALT 23 10/20/2014   AST 29 10/20/2014   NA 139 10/20/2014   K 4.4 10/20/2014   CL 101 10/20/2014   CREATININE 1.45 10/20/2014   BUN 34* 10/20/2014   CO2 29 10/20/2014   HGBA1C 5.5 10/20/2014    Assessment & Plan:   Problem List Items Addressed This Visit    HTN (hypertension)    Stable. Continue current meds.      Dyspnea - Primary    Recent improvement with switch to Anoro. Recent echo on 10/28 revealed EF of 45-50%. Advised close follow up with cardiology for stress test. Has upcoming PFT's as well. Will continue to monitor closely. I am concerned about worsening CAD given PMH.         Follow-up: 11/28, DO

## 2014-11-24 ENCOUNTER — Ambulatory Visit: Payer: Medicare Other | Admitting: Pulmonary Disease

## 2014-11-24 ENCOUNTER — Other Ambulatory Visit: Payer: Self-pay | Admitting: Pulmonary Disease

## 2014-11-24 ENCOUNTER — Ambulatory Visit: Payer: Medicare Other

## 2014-11-24 DIAGNOSIS — R0600 Dyspnea, unspecified: Secondary | ICD-10-CM

## 2014-11-25 ENCOUNTER — Ambulatory Visit: Payer: Medicare Other | Admitting: Pulmonary Disease

## 2014-11-26 ENCOUNTER — Encounter: Payer: Self-pay | Admitting: Internal Medicine

## 2014-11-26 ENCOUNTER — Ambulatory Visit (INDEPENDENT_AMBULATORY_CARE_PROVIDER_SITE_OTHER): Payer: Medicare Other | Admitting: Internal Medicine

## 2014-11-26 VITALS — BP 120/62 | HR 70 | Ht 69.0 in | Wt 166.5 lb

## 2014-11-26 DIAGNOSIS — Z95810 Presence of automatic (implantable) cardiac defibrillator: Secondary | ICD-10-CM | POA: Diagnosis not present

## 2014-11-26 DIAGNOSIS — I255 Ischemic cardiomyopathy: Secondary | ICD-10-CM

## 2014-11-26 NOTE — Patient Instructions (Signed)
Medication Instructions: - no changes  Labwork: - none  Procedures/Testing: - none  Follow-Up: - Your physician wants you to follow-up in: September 2017 with Dr. Graciela Husbands. You will receive a reminder letter in the mail two months in advance. If you don't receive a letter, please call our office to schedule the follow-up appointment.  Any Additional Special Instructions Will Be Listed Below (If Applicable). - none

## 2014-11-26 NOTE — Progress Notes (Signed)
ELECTROPHYSIOLOGY CONSULT NOTE  Patient ID: Tom Nguyen, MRN: [id number], DOB/AGE: [birth year] 81 y.o. Admit date: (Not on file) Date of Consult: 11/26/2014  Primary Physician: Everlene Other, DO Primary Cardiologist: TG Chief Complaint: To establish   HPI Tom Nguyen is a 81 y.o. male  Seen to establish CRT-D follow-up.  He has a history of ischemic heart disease and underwent bypass surgery 1995. Echocardiogram 2010 showed an ejection fraction of 35%. When he underwent ICD implantation and received shock therapy for question appropriate versus inappropriate arrhythmia. He subsequently underwent CRT upgrade 2011 which is been associated with significant interval improvement in functional status.  He has had some shortness of breath. He denies chest pain. No peripheral edema. He's had no syncope.  Echocardiogram done 10/16 demonstrated an ejection fraction of 45-50%. Remote Myoview was reviewed and was treated inferolateral infarction with an ejection fraction of 28% area this is apparently unchanged from 2004.  Myoview scanning is anticipated.    Past Medical History  Diagnosis Date  . Asthma   . Arthritis   . Blood in stool   . Emphysema of lung (HCC)   . Hypertension   . Hyperlipidemia   . Heart disease   . Thyroid disease   . Ischemic cardiomyopathy   . CHF (congestive heart failure) (HCC)   . LBBB (left bundle branch block)   . COPD (chronic obstructive pulmonary disease) (HCC)   . Sleep apnea   . MI (myocardial infarction) [facility]) 1995      Surgical History:  Past Surgical History  Procedure Laterality Date  . Coronary artery bypass graft  1995    5 vessels  . Pacemaker insertion  2002  . Reverse shoulder arthroplasty Right 2013  . Reverse total shoulder arthroplasty Left 2014  . Total knee arthroplasty  2009    both knees  . Cardiac catheterization    . Insert / replace / remove pacemaker      [employer], political party      Home Meds: Prior to  Admission medications   Medication Sig Start Date End Date Taking? Authorizing Provider  acetaminophen (TYLENOL) 500 MG tablet Take 500 mg by mouth every 6 (six) hours as needed.    Historical Provider, MD  alendronate (FOSAMAX) 70 MG tablet Take 500 mg by mouth.  08/25/14   Historical Provider, MD  allopurinol (ZYLOPRIM) 100 MG tablet Take 100 mg by mouth daily.  08/25/14   Historical Provider, MD  carvedilol (COREG) 25 MG tablet Take 25 mg by mouth 2 (two) times daily with a meal.  09/26/14   Historical Provider, MD  eplerenone (INSPRA) 50 MG tablet Take 50 mg by mouth daily.  08/12/14   Historical Provider, MD  ferrous sulfate 325 (65 FE) MG tablet Take 1 tablet (325 mg total) by mouth 3 (three) times daily. 10/23/14   [name], DO  folic acid (FOLVITE) 1 MG tablet Take 1 mg by mouth daily.  08/25/14   Historical Provider, MD  furosemide (LASIX) 80 MG tablet Take 80 mg by mouth daily.  09/17/14   Historical Provider, MD  hydroxychloroquine (PLAQUENIL) 200 MG tablet Take 200 mg by mouth 2 (two) times daily.  09/26/14   Historical Provider, MD  leflunomide (ARAVA) 20 MG tablet Take 20 mg by mouth daily.  08/25/14   Historical Provider, MD  levothyroxine (SYNTHROID, LEVOTHROID) 75 MCG tablet Take 75 mcg by mouth daily before breakfast.  09/17/14   Historical Provider, MD  lisinopril (PRINIVIL,ZESTRIL) 20 MG  tablet Take 1 tablet (20 mg total) by mouth daily. 11/04/14   [name], DO  Multiple Vitamins-Minerals (MULTIVITAMIN ADULT PO) Take by mouth daily.     Historical Provider, MD  nitroGLYCERIN (NITROLINGUAL) 0.4 MG/SPRAY spray Place 1 spray under the tongue every 5 (five) minutes x 3 doses as needed for chest pain.    Historical Provider, MD  omeprazole (PRILOSEC) 20 MG capsule Take 20 mg by mouth daily.  09/17/14   Historical Provider, MD  predniSONE (DELTASONE) 50 MG tablet Take 1 tablet (50 mg total) by mouth daily. 10/23/14   [name], DO  simvastatin (ZOCOR) 40 MG tablet Take 40 mg by mouth daily.   10/02/14   Historical Provider, MD  traMADol (ULTRAM) 50 MG tablet Take 1 tablet (50 mg total) by mouth every 8 (eight) hours as needed. 10/23/14   [name], DO  Umeclidinium-Vilanterol (ANORO ELLIPTA) 62.5-25 MCG/INH AEPB Inhale 1 puff into the lungs daily. 11/07/14   Merwyn Katos, MD  Umeclidinium-Vilanterol 62.5-25 MCG/INH AEPB Inhale 1 puff into the lungs daily. 11/07/14   Merwyn Katos, MD  Umeclidinium-Vilanterol 62.5-25 MCG/INH AEPB Inhale 1 puff into the lungs daily. 11/13/14   Merwyn Katos, MD    Allergies: No Known Allergies  Social History   Social History  . Marital Status: Married    Spouse Name: N/A  . Number of Children: N/A  . Years of Education: N/A   Occupational History  . Not on file.   Social History Main Topics  . Smoking status: Former Smoker    Quit date: 02/15/1993  . Smokeless tobacco: Never Used  . Alcohol Use: 0.0 - 0.6 oz/week    0-1 Standard drinks or equivalent per week  . Drug Use: No  . Sexual Activity: Not on file   Other Topics Concern  . Not on file   Social History Narrative     Family History  Problem Relation Age of Onset  . Hyperlipidemia Father   . Stroke Father   . Hypertension Father   . Kidney disease Father      ROS:  Please see the history of present illness.     All other systems reviewed and negative.    Physical Exam: Blood pressure 120/62, pulse 70, height 5\' 9"  (1.753 m), weight 166 lb 8 oz (75.524 kg). General: Well developed, well nourished male in no acute distress. Head: Normocephalic, atraumatic, sclera non-icteric, no xanthomas, nares are without discharge. EENT: normal  Lymph Nodes:  none Neck: Negative for carotid bruits. JVD not elevated. Back:without scoliosis kyphosis  Lungs: Clear bilaterally to auscultation without wheezes, rales, or rhonchi. Breathing is unlabored. Device pocket well healed; without hematoma or erythema.  There is no tethering  Heart: RRR with S1 S2. No  murmur . No  rubs, or gallops appreciated. Abdomen: Soft, non-tender, non-distended with normoactive bowel sounds. No hepatomegaly. No rebound/guarding. No obvious abdominal masses. Msk:  Strength and tone appear normal for age. Extremities: No clubbing or cyanosis. No edema.  Distal pedal pulses are 2+ and equal bilaterally. Skin: Warm and Dry Neuro: Alert and oriented X 3. CN III-XII intact Grossly normal sensory and motor function . Psych:  Responds to questions appropriately with a normal affect.      Labs: Cardiac Enzymes No results for input(s): CKTOTAL, CKMB, TROPONINI in the last 72 hours. CBC Lab Results  Component Value Date   WBC 7.3 10/20/2014   HGB 10.1* 10/20/2014   HCT 31.3* 10/20/2014  MCV 96.7 10/20/2014   PLT 243.0 10/20/2014   PROTIME: No results for input(s): LABPROT, INR in the last 72 hours. Chemistry No results for input(s): NA, K, CL, CO2, BUN, CREATININE, CALCIUM, PROT, BILITOT, ALKPHOS, ALT, AST, GLUCOSE in the last 168 hours.  Invalid input(s): LABALBU Lipids Lab Results  Component Value Date   CHOL 91 10/20/2014   HDL 40.00 10/20/2014   LDLCALC 28 10/20/2014   TRIG 112.0 10/20/2014   BNP No results found for: PROBNP Thyroid Function Tests: No results for input(s): TSH, T4TOTAL, T3FREE, THYROIDAB in the last 72 hours.  Invalid input(s): FREET3 Miscellaneous No results found for: DDIMER  Radiology/Studies:  No results found.  EKG: 10/16 P synchronous pacing with frequent ventricular ectopy and a QRS morphology negative V1 and positive lead 1 ECG  AV pacing 16.15.50 QRS upright V1 and neg 1  Assessment and Plan:  Ischemic cardiomyopathy  S/p CABG   CHF  Chronic mixed  CRT-D [employer]  COPD  The patient has ICD implantation. AV delays were long with inadequate ventricular pacing. We shortened the AV delays at least by electrocardiogram, resynchronization is much improved.  Continue curent meds  Without symptoms of  ischemia        Sherryl Manges

## 2014-11-28 ENCOUNTER — Ambulatory Visit (INDEPENDENT_AMBULATORY_CARE_PROVIDER_SITE_OTHER): Payer: Medicare Other | Admitting: Pulmonary Disease

## 2014-11-28 DIAGNOSIS — R0600 Dyspnea, unspecified: Secondary | ICD-10-CM

## 2014-11-28 LAB — PULMONARY FUNCTION TEST
DL/VA % pred: 64 %
DL/VA: 2.92 ml/min/mmHg/L
DLCO unc % pred: 39 %
DLCO unc: 12.28 ml/min/mmHg
FEF 25-75 Post: 0.42 L/sec
FEF 25-75 Pre: 0.58 L/sec
FEF2575-%CHANGE-POST: -27 %
FEF2575-%Pred-Post: 22 %
FEF2575-%Pred-Pre: 30 %
FEV1-%Change-Post: -14 %
FEV1-%PRED-POST: 42 %
FEV1-%Pred-Pre: 49 %
FEV1-POST: 1.16 L
FEV1-PRE: 1.35 L
FEV1FVC-%CHANGE-POST: -6 %
FEV1FVC-%Pred-Pre: 77 %
FEV6-%CHANGE-POST: -7 %
FEV6-%PRED-POST: 60 %
FEV6-%PRED-PRE: 65 %
FEV6-POST: 2.16 L
FEV6-Pre: 2.34 L
FEV6FVC-%Change-Post: -1 %
FEV6FVC-%PRED-PRE: 106 %
FEV6FVC-%Pred-Post: 105 %
FVC-%CHANGE-POST: -8 %
FVC-%PRED-PRE: 62 %
FVC-%Pred-Post: 57 %
FVC-POST: 2.21 L
FVC-Pre: 2.42 L
POST FEV6/FVC RATIO: 98 %
PRE FEV1/FVC RATIO: 56 %
Post FEV1/FVC ratio: 52 %
Pre FEV6/FVC Ratio: 99 %
RV % PRED: 87 %
RV: 2.28 L
TLC % pred: 72 %
TLC: 4.98 L

## 2014-11-28 NOTE — Progress Notes (Signed)
PFT performed today. 

## 2014-12-01 LAB — CUP PACEART INCLINIC DEVICE CHECK
Battery Remaining Longevity: 60 mo
Brady Statistic RA Percent Paced: 46 %
Brady Statistic RV Percent Paced: 90 %
Date Time Interrogation Session: 20161114162855
HIGH POWER IMPEDANCE MEASURED VALUE: 57 Ohm
Implantable Lead Implant Date: 20110519
Implantable Lead Implant Date: 20110519
Implantable Lead Location: 753862
Implantable Lead Location: 753862
Implantable Lead Model: 154
Implantable Lead Serial Number: 353235
Lead Channel Impedance Value: 534 Ohm
Lead Channel Pacing Threshold Amplitude: 0.5 V
Lead Channel Pacing Threshold Amplitude: 1.4 V
Lead Channel Pacing Threshold Pulse Width: 0.5 ms
Lead Channel Pacing Threshold Pulse Width: 0.5 ms
Lead Channel Sensing Intrinsic Amplitude: 4.9 mV
Lead Channel Setting Pacing Amplitude: 2 V
Lead Channel Setting Pacing Amplitude: 2 V
Lead Channel Setting Pacing Pulse Width: 0.5 ms
MDC IDC LEAD IMPLANT DT: 20110519
MDC IDC LEAD LOCATION: 753862
MDC IDC LEAD MODEL: 4087
MDC IDC LEAD MODEL: 4525
MDC IDC LEAD SERIAL: 109634
MDC IDC LEAD SERIAL: 160386
MDC IDC MSMT LEADCHNL LV IMPEDANCE VALUE: 450 Ohm
MDC IDC MSMT LEADCHNL LV PACING THRESHOLD PULSEWIDTH: 1 ms
MDC IDC MSMT LEADCHNL LV SENSING INTR AMPL: 13.6 mV
MDC IDC MSMT LEADCHNL RA IMPEDANCE VALUE: 454 Ohm
MDC IDC MSMT LEADCHNL RA PACING THRESHOLD AMPLITUDE: 0.5 V
MDC IDC MSMT LEADCHNL RA SENSING INTR AMPL: 5 mV
MDC IDC PG SERIAL: 588213
MDC IDC SET LEADCHNL LV PACING AMPLITUDE: 2 V
MDC IDC SET LEADCHNL LV PACING PULSEWIDTH: 1 ms

## 2014-12-04 ENCOUNTER — Ambulatory Visit (INDEPENDENT_AMBULATORY_CARE_PROVIDER_SITE_OTHER): Payer: Medicare Other | Admitting: Pulmonary Disease

## 2014-12-04 ENCOUNTER — Encounter: Payer: Self-pay | Admitting: Pulmonary Disease

## 2014-12-04 VITALS — BP 110/58 | HR 72 | Wt 171.0 lb

## 2014-12-04 DIAGNOSIS — I255 Ischemic cardiomyopathy: Secondary | ICD-10-CM

## 2014-12-04 DIAGNOSIS — J449 Chronic obstructive pulmonary disease, unspecified: Secondary | ICD-10-CM | POA: Diagnosis not present

## 2014-12-04 DIAGNOSIS — R0600 Dyspnea, unspecified: Secondary | ICD-10-CM | POA: Diagnosis not present

## 2014-12-04 MED ORDER — UMECLIDINIUM-VILANTEROL 62.5-25 MCG/INH IN AEPB: 1.0000 | INHALATION_SPRAY | Freq: Every day | RESPIRATORY_TRACT | Status: DC

## 2014-12-04 NOTE — Patient Instructions (Signed)
Continue Anoro Cont albuterol as needed  Follow up in 3-4 months or as needed

## 2014-12-05 NOTE — Progress Notes (Signed)
PULMONARY OFFICE FOLLOW UP  PROBLEMS: COPD Multifactorial dyspnea  SUBJ: Here to review response to Anoro and review PFTs. He has been seen by Cardiology in interim and underwent TTE 10/28. He feels that he has benefited from [facility] and is less SOB. No new respiratory or chest symptoms. Continues to deny CP, fever, cough, sputum, hemoptysis, LE edema and calf tenderness  OBJ: Filed Vitals:   12/04/14 1028  BP: 110/58  Pulse: 72  Weight: 171 lb (77.565 kg)  SpO2: 96%    Gen: WDWN in NAD HEENT: All WNL Neck: NO LAN, no JVD noted Lungs: mildly diminished BS, normal percussion note throughout, no wheezes Cardiovascular: Reg rate, normal rhythm, no M noted Abdomen: Soft, NT +BS Ext: no C/C/E Neuro: CNs intact, motor/sens grossly intact Skin: No lesions noted   DATA: 10/28 TTE: LVEF 45-50%. Post-inf HK. PA syst pressure est 47 mmHg 11/11 PFTs: Mod-severe obstruction Fev1 1.16 L. 42% pred, TLC 72% pred, DlCO 39% pred   IMPRESSION: Moderate to severe COPD. This appears to be mostly fixed obstruction with little reversibility and little day to day variation. Nonetheless, he seems to hae responded favorably to long-acting bronchodilator therapy (Anoro)  Multifactorial dyspnea - I now suspect COPD is the major contributor  PLAN: We discussed the diagnosis of COPD and its implications. I emphasized the important things that he can do for himself including focusing on proper health habits of good nutrition, regular exercise and weight management. In addition, we discussed avoidance of infections with pneumococcal vaccine (he is up to date) and annual influenza vaccine (he has received one this year). In addition, we discussed limiting exposure to others with respiratory infections.   Cont Anoro  Follow up in 4 months or sooner as needed   Merwyn Katos, MD [facility]

## 2014-12-09 ENCOUNTER — Ambulatory Visit: Payer: Medicare Other | Admitting: Internal Medicine

## 2014-12-16 ENCOUNTER — Ambulatory Visit
Admission: RE | Admit: 2014-12-16 | Discharge: 2014-12-16 | Disposition: A | Discharge status: Home / Self Care | Payer: Medicare Other | Source: Ambulatory Visit | Attending: Family Medicine | Admitting: Family Medicine

## 2014-12-16 ENCOUNTER — Encounter: Payer: Self-pay | Admitting: Family Medicine

## 2014-12-16 ENCOUNTER — Ambulatory Visit (INDEPENDENT_AMBULATORY_CARE_PROVIDER_SITE_OTHER): Payer: Medicare Other | Admitting: Family Medicine

## 2014-12-16 VITALS — BP 120/80 | HR 102 | Temp 97.8°F | Ht 69.0 in | Wt 161.2 lb

## 2014-12-16 DIAGNOSIS — J441 Chronic obstructive pulmonary disease with (acute) exacerbation: Secondary | ICD-10-CM | POA: Diagnosis not present

## 2014-12-16 DIAGNOSIS — Z95810 Presence of automatic (implantable) cardiac defibrillator: Secondary | ICD-10-CM | POA: Insufficient documentation

## 2014-12-16 DIAGNOSIS — R918 Other nonspecific abnormal finding of lung field: Secondary | ICD-10-CM | POA: Diagnosis not present

## 2014-12-16 DIAGNOSIS — I517 Cardiomegaly: Secondary | ICD-10-CM | POA: Insufficient documentation

## 2014-12-16 DIAGNOSIS — I255 Ischemic cardiomyopathy: Secondary | ICD-10-CM | POA: Diagnosis not present

## 2014-12-16 DIAGNOSIS — Z951 Presence of aortocoronary bypass graft: Secondary | ICD-10-CM | POA: Insufficient documentation

## 2014-12-16 MED ORDER — DOXYCYCLINE HYCLATE 100 MG PO TABS: 100.0000 mg | ORAL_TABLET | Freq: Two times a day (BID) | ORAL | Status: DC

## 2014-12-16 MED ORDER — PREDNISONE 50 MG PO TABS: 50.0000 mg | ORAL_TABLET | Freq: Every day | ORAL | Status: DC

## 2014-12-16 NOTE — Progress Notes (Signed)
Pre visit review using our clinic review tool, if applicable. No additional management support is needed unless otherwise documented below in the visit note. 

## 2014-12-16 NOTE — Patient Instructions (Signed)
Take the antibiotic twice daily as prescribed.  Continue your inhalers.  Take the steroid daily for 5 days.  Call me if you worsen or fail to improve.  Take care  Dr. Adriana Simas

## 2014-12-17 DIAGNOSIS — J441 Chronic obstructive pulmonary disease with (acute) exacerbation: Secondary | ICD-10-CM | POA: Insufficient documentation

## 2014-12-17 NOTE — Progress Notes (Signed)
Subjective:  Patient ID: Tom Nguyen, male    DOB: 1932/01/19  Age: 81 y.o. MRN: [id number]  CC: Cough  HPI:  81 year old male with a complicated past medical history including CAD, ischemic cardiac myopathy, COPD, CKD, hypertension, hyperlipidemia resents with complaints of cough.  Cough  Patient reports a three-day history of cough.  He states the cough is productive of yellow mucus.  He states that his breathing is stable but he does have some shortness of breath.  No exacerbating or relieving factors.   No associated fever, chills.  Social Hx   Social History   Social History  . Marital Status: Married    Spouse Name: N/A  . Number of Children: N/A  . Years of Education: N/A   Social History Main Topics  . Smoking status: Former Smoker    Quit date: 02/15/1993  . Smokeless tobacco: Never Used  . Alcohol Use: 0.0 - 0.6 oz/week    0-1 Standard drinks or equivalent per week  . Drug Use: No  . Sexual Activity: Not Asked   Other Topics Concern  . None   Social History Narrative   Review of Systems  Constitutional: Negative for fever.  Respiratory: Positive for cough and shortness of breath.    Objective:  BP 120/80 mmHg  Pulse 102  Temp(Src) 97.8 F (36.6 C) (Oral)  Ht 5\' 9"  (1.753 m)  Wt 161 lb 4 oz (73.143 kg)  BMI 23.80 kg/m2  SpO2 98%  BP/Weight 12/16/2014 12/04/2014 11/26/2014  Systolic BP 120 110 120  Diastolic BP 80 58 62  Wt. (Lbs) 161.25 171 166.5  BMI 23.8 25.24 24.58    Physical Exam  Constitutional: He appears well-developed. No distress.  HENT:  Head: Normocephalic and atraumatic.  Mouth/Throat: Oropharynx is clear and moist.  Normal TM's bilaterally.   Cardiovascular: Normal rate and regular rhythm.   Pulmonary/Chest: Effort normal.  Decreased breath sounds throughout.  Neurological: He is alert.  Psychiatric: He has a normal mood and affect.  Vitals reviewed.  Lab Results  Component Value Date   WBC 7.3 10/20/2014   HGB  10.1* 10/20/2014   HCT 31.3* 10/20/2014   PLT 243.0 10/20/2014   GLUCOSE 106* 10/20/2014   CHOL 91 10/20/2014   TRIG 112.0 10/20/2014   HDL 40.00 10/20/2014   LDLCALC 28 10/20/2014   ALT 23 10/20/2014   AST 29 10/20/2014   NA 139 10/20/2014   K 4.4 10/20/2014   CL 101 10/20/2014   CREATININE 1.45 10/20/2014   BUN 34* 10/20/2014   CO2 29 10/20/2014   HGBA1C 5.5 10/20/2014   Dg Chest 2 View  12/17/2014  CLINICAL DATA:  Productive cough.  Shortness of breath. EXAM: CHEST  2 VIEW COMPARISON:  10/21/2014. FINDINGS: Mediastinum and hilar structures normal. Prior CABG. AICD in stable position. Stable cardiomegaly with normal pulmonary vascularity . Lung volumes with mild bibasilar atelectasis. No pleural effusion or pneumothorax. Bilateral shoulder replacements. IMPRESSION: 1. Prior CABG.  AICD in stable position.  Stable cardiomegaly. 2. Low lung volumes with mild bibasilar atelectasis. Electronically Signed   By: 12/21/2014  Register   On: 12/17/2014 07:47   Assessment & Plan:   Problem List Items Addressed This Visit    COPD exacerbation (HCC) - Primary    History consistent with COPD exacerbation. Given comorbidities I obtained a chest x-ray to rule out underlying infiltrate. I independently reviewed the film. No acute infiltrate was noted. Treating with doxycycline and prednisone. Advised him to call/return if he fails to  improve or worsens.      Relevant Medications   predniSONE (DELTASONE) 50 MG tablet   Other Relevant Orders   DG Chest 2 View (Completed)      Meds ordered this encounter  Medications  . doxycycline (VIBRA-TABS) 100 MG tablet    Sig: Take 1 tablet (100 mg total) by mouth 2 (two) times daily.    Dispense:  14 tablet    Refill:  0  . predniSONE (DELTASONE) 50 MG tablet    Sig: Take 1 tablet (50 mg total) by mouth daily.    Dispense:  5 tablet    Refill:  0    Follow-up: Return if symptoms worsen or fail to improve.  Everlene Other DO [facility]

## 2014-12-17 NOTE — Assessment & Plan Note (Addendum)
History consistent with COPD exacerbation. Given comorbidities I obtained a chest x-ray to rule out underlying infiltrate. I independently reviewed the film. No acute infiltrate was noted. Treating with doxycycline and prednisone. Advised him to call/return if he fails to improve or worsens.

## 2014-12-22 ENCOUNTER — Ambulatory Visit (INDEPENDENT_AMBULATORY_CARE_PROVIDER_SITE_OTHER): Payer: Medicare Other | Admitting: Cardiovascular Disease

## 2014-12-22 ENCOUNTER — Encounter: Payer: Self-pay | Admitting: Cardiovascular Disease

## 2014-12-22 VITALS — BP 100/64 | HR 70 | Ht 69.0 in | Wt 166.2 lb

## 2014-12-22 DIAGNOSIS — I10 Essential (primary) hypertension: Secondary | ICD-10-CM | POA: Diagnosis not present

## 2014-12-22 DIAGNOSIS — I255 Ischemic cardiomyopathy: Secondary | ICD-10-CM | POA: Diagnosis not present

## 2014-12-22 DIAGNOSIS — R0602 Shortness of breath: Secondary | ICD-10-CM

## 2014-12-22 DIAGNOSIS — I2510 Atherosclerotic heart disease of native coronary artery without angina pectoris: Secondary | ICD-10-CM | POA: Diagnosis not present

## 2014-12-22 DIAGNOSIS — J441 Chronic obstructive pulmonary disease with (acute) exacerbation: Secondary | ICD-10-CM

## 2014-12-22 DIAGNOSIS — E785 Hyperlipidemia, unspecified: Secondary | ICD-10-CM

## 2014-12-22 MED ORDER — PREDNISONE 20 MG PO TABS: 20.0000 mg | ORAL_TABLET | Freq: Every day | ORAL | Status: DC

## 2014-12-22 NOTE — Assessment & Plan Note (Signed)
Blood pressure is well controlled on today's visit. No changes made to the medications. 

## 2014-12-22 NOTE — Assessment & Plan Note (Signed)
He appears relatively euvolemic on today's visit. No changes to his medications 

## 2014-12-22 NOTE — Assessment & Plan Note (Signed)
We have offered him a stress test on his last clinic visit And again today. He has declined at this time Denies any active chest pain. He will call to reschedule his stress test if symptoms recur

## 2014-12-22 NOTE — Progress Notes (Signed)
Patient ID: Tom Nguyen, male    DOB: January 16, 1933, 81 y.o.   MRN: [id number]  HPI Comments: Mr. Krapf is a 22 -year-old gentleman with long history of smoking starting at age 58, stopped in 1995, coronary artery disease, bypass surgery in 1995, ischemic cardiomyopathy, sleep apnea with CPAP, COPD, ICD 3 leads, underlying left bundle branch block with ejection fraction 28% who presents for routine follow-up of his coronary artery disease  On his last clinic visit we recommended a stress test. He did not schedule this as he developed bronchitis, COPD exacerbation. He has completed his doxycycline and prednisone. Wife reports that he continues to have significant cough, chest tightness, wheezing. She is strongly urging more treatment for his symptoms. She does report that he no longer looks great, now has pink in his cheeks. He does report having significant coughing, less green coming up than before in the past day or 2. Denies having any significant chest pain. Last dose of prednisone 50 mg was yesterday He's been taking iron daily, past hematocrit 31 Denies having significant GI bleed but stool is dark on the iron  Other past medical history On his last clinic visit he reported  increasing shortness of breath since the end of August, beginning of September 2016.  [place] and had hemorrhoids cauterized.   He has a Research officer, political party. Notes provided shows ejection fraction by echocardiogram October 2010 was 35%. Ejection fraction in 2007 was 40% Stress test July 2006 showed inferior posterior lateral infarction with peri-infarct ischemia, ejection fraction 28%. Akinesis of the inferior myocardial segment and hypokinesis of the inferolateral segment. Prior study July 2004, no change  Notes indicate creatinine 1.65 in November 2015 with GFR 39 BNP 07/28/2014 was 200 EKG on today's visit shows a sense, V pace at 70 bpm  He does report that he was previously on warfarin.  He does not take aspirin Family is unaware why he was on the warfarin in the past. They deny any atrial fibrillation or other arrhythmia. He had significant bruising, bleeding. Warfarin was held He reports he has not been on aspirin for a long time but does not know why. Possibly from bleeding. Notes also indicate GI bleeding dates back to 2011. Perhaps this is why his anticoagulation was held      No Known Allergies  Current Outpatient Prescriptions on File Prior to Visit  Medication Sig Dispense Refill  . acetaminophen (TYLENOL) 500 MG tablet Take 500 mg by mouth every 6 (six) hours as needed.    Marland Kitchen alendronate (FOSAMAX) 70 MG tablet Take 500 mg by mouth.     Marland Kitchen allopurinol (ZYLOPRIM) 100 MG tablet Take 100 mg by mouth daily.     . carvedilol (COREG) 25 MG tablet Take 25 mg by mouth 2 (two) times daily with a meal.     . doxycycline (VIBRA-TABS) 100 MG tablet Take 1 tablet (100 mg total) by mouth 2 (two) times daily. 14 tablet 0  . eplerenone (INSPRA) 50 MG tablet Take 50 mg by mouth daily.     . ferrous sulfate 325 (65 FE) MG tablet Take 1 tablet (325 mg total) by mouth 3 (three) times daily. 90 tablet 2  . folic acid (FOLVITE) 1 MG tablet Take 1 mg by mouth daily.     . furosemide (LASIX) 80 MG tablet Take 80 mg by mouth daily.     . hydroxychloroquine (PLAQUENIL) 200 MG tablet Take 200 mg by mouth 2 (two) times daily.     Marland Kitchen  leflunomide (ARAVA) 20 MG tablet Take 20 mg by mouth daily.     Marland Kitchen levothyroxine (SYNTHROID, LEVOTHROID) 75 MCG tablet Take 75 mcg by mouth daily before breakfast.     . lisinopril (PRINIVIL,ZESTRIL) 20 MG tablet Take 1 tablet (20 mg total) by mouth daily. 90 tablet 1  . Multiple Vitamins-Minerals (MULTIVITAMIN ADULT PO) Take by mouth daily.     . nitroGLYCERIN (NITROLINGUAL) 0.4 MG/SPRAY spray Place 1 spray under the tongue every 5 (five) minutes x 3 doses as needed for chest pain.    Marland Kitchen omeprazole (PRILOSEC) 20 MG capsule Take 20 mg by mouth daily.     .  simvastatin (ZOCOR) 40 MG tablet Take 40 mg by mouth daily.     . traMADol (ULTRAM) 50 MG tablet Take 1 tablet (50 mg total) by mouth every 8 (eight) hours as needed. 60 tablet 1  . Umeclidinium-Vilanterol (ANORO ELLIPTA) 62.5-25 MCG/INH AEPB Inhale 1 puff into the lungs daily. 180 each 3   No current facility-administered medications on file prior to visit.    Past Medical History  Diagnosis Date  . Asthma   . Arthritis   . Blood in stool   . Emphysema of lung (HCC)   . Hypertension   . Hyperlipidemia   . Heart disease   . Thyroid disease   . Ischemic cardiomyopathy   . CHF (congestive heart failure) (HCC)   . LBBB (left bundle branch block)   . COPD (chronic obstructive pulmonary disease) (HCC)   . Sleep apnea   . MI (myocardial infarction) (HCC) 1995    Past Surgical History  Procedure Laterality Date  . Coronary artery bypass graft  1995    5 vessels  . Pacemaker insertion  2002  . Reverse shoulder arthroplasty Right 2013  . Reverse total shoulder arthroplasty Left 2014  . Total knee arthroplasty  2009    both knees  . Cardiac catheterization    . Insert / replace / remove pacemaker      [employer]     Social History  reports that he quit smoking about 21 years ago. He has never used smokeless tobacco. He reports that he drinks alcohol. He reports that he does not use illicit drugs.  Family History family history includes Hyperlipidemia in his father; Hypertension in his father; Kidney disease in his father; Stroke in his father.   Review of Systems  Respiratory: Positive for cough and shortness of breath.   Cardiovascular: Negative.   Gastrointestinal: Negative.   Musculoskeletal: Positive for gait problem.  Neurological: Positive for weakness.  Hematological: Negative.   Psychiatric/Behavioral: Negative.   All other systems reviewed and are negative.   BP 100/64 mmHg  Pulse 70  Ht 5\' 9"  (1.753 m)  Wt 166 lb 4 oz (75.411 kg)  BMI 24.54  kg/m2  Physical Exam  Constitutional: He is oriented to person, place, and time. He appears well-developed and well-nourished.  Pale-appearing gentleman in no distress, breathing comfortably  HENT:  Head: Normocephalic.  Nose: Nose normal.  Mouth/Throat: Oropharynx is clear and moist.  Eyes: Conjunctivae are normal. Pupils are equal, round, and reactive to light.  Neck: Normal range of motion. Neck supple. No JVD present.  Cardiovascular: Normal rate, regular rhythm, normal heart sounds and intact distal pulses.  Exam reveals no gallop and no friction rub.   No murmur heard. Pulmonary/Chest: Effort normal. No respiratory distress. He has decreased breath sounds. He has no wheezes. He has rales. He exhibits no tenderness.  Abdominal: Soft. Bowel sounds are normal. He exhibits no distension. There is no tenderness.  Musculoskeletal: Normal range of motion. He exhibits no edema or tenderness.  Lymphadenopathy:    He has no cervical adenopathy.  Neurological: He is alert and oriented to person, place, and time. Coordination normal.  Skin: Skin is warm and dry. No rash noted. No erythema.  Psychiatric: He has a normal mood and affect. His behavior is normal. Judgment and thought content normal.

## 2014-12-22 NOTE — Assessment & Plan Note (Signed)
Cholesterol is at goal on the current lipid regimen. No changes to the medications were made.  

## 2014-12-22 NOTE — Patient Instructions (Signed)
Please start prednisone 40 mg for three days Then 30 mg for 3 days Then 20 mg for three days Then 10 mg for three days  Please call us if you have new issues that need to be addressed before your next appt.  Your physician wants you to follow-up in: 6 months.  You will receive a reminder letter in the mail two months in advance. If you don't receive a letter, please call our office to schedule the follow-up appointment.

## 2014-12-22 NOTE — Assessment & Plan Note (Signed)
Shortness of breath likely multifactorial including underlying COPD, cardiomyopathy, basic deconditioning/obesity. If symptoms get worse need to consider ischemia. We did offer stress test, he has declined at this time

## 2014-12-22 NOTE — Assessment & Plan Note (Signed)
Wife is concerned about continued symptoms, cough and requesting more medication. Sputum is improving but baseline shortness of breath persists. We will provide a short prednisone taper coming off the recent 50 mg doses provided by Dr. Adriana Simas. 40 mg for 3 days, 30 mg for 3 days, 20 mg for 3 days followed by 10 mg for 3 days. If he continues to have symptoms after this, recommended he call Dr. Adriana Simas or our office

## 2015-01-21 ENCOUNTER — Other Ambulatory Visit: Payer: Self-pay | Admitting: Family Medicine

## 2015-01-21 ENCOUNTER — Telehealth: Payer: Self-pay | Admitting: Family Medicine

## 2015-01-21 DIAGNOSIS — M069 Rheumatoid arthritis, unspecified: Secondary | ICD-10-CM

## 2015-01-21 NOTE — Telephone Encounter (Signed)
Pt's daughter called and stated that Anterio needs a referral to see Dr. Jaci Lazier at [facility] for. She states that he is on plaquenil. he also needs a referral to an opthamologist. Please enter referrals.thanks

## 2015-02-04 ENCOUNTER — Ambulatory Visit: Payer: Medicare Other | Admitting: Family Medicine

## 2015-03-04 ENCOUNTER — Telehealth: Payer: Self-pay

## 2015-03-04 NOTE — Telephone Encounter (Signed)
Started last week with hard time breathing. Face is red. Denies any fever. Prod cough w/yellow and chest tightness. SOB worse than usual. Wife states pt started taking Prednisone 20mg  yesterday not sure who prescribed. Gave pt appt with KK on Thursday am and informed pt if SOB got worse he needs to go to the ER. Pt verbalized understanding. Nothing further needed.

## 2015-03-04 NOTE — Telephone Encounter (Signed)
Pt wife calling, states pt is having difficulty breathing. States this has been going on since last week. States he has been using his inhaler, and "his breather" at night. States these things do not help. Please call.

## 2015-03-05 ENCOUNTER — Encounter: Payer: Self-pay | Admitting: Internal Medicine

## 2015-03-05 ENCOUNTER — Ambulatory Visit (INDEPENDENT_AMBULATORY_CARE_PROVIDER_SITE_OTHER): Payer: Medicare Other | Admitting: Internal Medicine

## 2015-03-05 VITALS — HR 96 | Ht 68.0 in | Wt 171.2 lb

## 2015-03-05 DIAGNOSIS — J449 Chronic obstructive pulmonary disease, unspecified: Secondary | ICD-10-CM

## 2015-03-05 MED ORDER — ALBUTEROL SULFATE (2.5 MG/3ML) 0.083% IN NEBU
2.5000 mg | INHALATION_SOLUTION | Freq: Once | RESPIRATORY_TRACT | Status: AC
  Administered 2015-03-05: 2.5 mg via RESPIRATORY_TRACT

## 2015-03-05 MED ORDER — ALBUTEROL SULFATE HFA 108 (90 BASE) MCG/ACT IN AERS: 2.0000 | INHALATION_SPRAY | RESPIRATORY_TRACT | Status: DC | PRN

## 2015-03-05 MED ORDER — ALBUTEROL SULFATE (2.5 MG/3ML) 0.083% IN NEBU: 2.5000 mg | INHALATION_SOLUTION | RESPIRATORY_TRACT | Status: DC

## 2015-03-05 NOTE — Progress Notes (Signed)
PULMONARY OFFICE FOLLOW UP  PROBLEMS/CC Follow up for COPD  SUBJ/HPI  Patient with chronic SOb and worsening DOE No signs of infection at this time There are times patient looks dusky and sometimes red in face with exertion  OBJ: Filed Vitals:   03/05/15 1128  Pulse: 96  Height: 5\' 8"  (1.727 m)  Weight: 171 lb 3.2 oz (77.656 kg)  SpO2: 91%    Gen:  ill appearing, slight increased WOB, no wheezes HEENT: All WNL Neck: NO LAN, no JVD noted Lungs: mildly diminished BS, normal percussion note throughout, no wheezes Cardiovascular: Reg rate, normal rhythm, no M noted Abdomen: Soft, NT +BS Ext: no C/C/E Neuro: CNs intact, motor/sens grossly intact Skin: No lesions noted   DATA: 10/28 TTE: LVEF 45-50%. Post-inf HK. PA syst pressure est 47 mmHg 11/11 PFTs: Mod-severe obstruction Fev1 1.16 L. 42% pred, TLC 72% pred, DlCO 39% pred   IMPRESSION: 82 yo white male with Moderate to severe COPD with chronic  SOB and DOE, patient will need to be assessed for oxygen needs at this time  1.will check ambulatory oxygen levels-will need 6 MWT 2.check ONO 3.albuterol as needed 4.continue Anoro and FLovent 5. Albuterol treatment x 1 now  Follow up 1 months   His resp status is very tenious, resp function is poor.  The Patient requires high complexity decision making for assessment and support, frequent evaluation and titration of therapies. Patient/Family are satisfied with Plan of action and management. All questions answered  96, M.D.  [facility]  Medical Director [facility] Medical Director [facility] Cardio-Pulmonary Department

## 2015-03-05 NOTE — Addendum Note (Signed)
Addended by: Meyer Cory R on: 03/05/2015 12:16 PM   Modules accepted: Orders

## 2015-03-06 ENCOUNTER — Telehealth: Payer: Self-pay | Admitting: *Deleted

## 2015-03-06 NOTE — Telephone Encounter (Signed)
Pt needed number for [facility]PS to reschedule a time to get machine for ONO. Number given. Nothing further needed.

## 2015-03-06 NOTE — Telephone Encounter (Signed)
Please call patient as he has an appt on Monday 03/09/15 that he needs to reschedule. He isn't sure what it is for (sleep study ?) they are coming to his house at noon but he has another appt. Thanks

## 2015-03-09 ENCOUNTER — Ambulatory Visit (INDEPENDENT_AMBULATORY_CARE_PROVIDER_SITE_OTHER): Payer: Medicare Other | Admitting: *Deleted

## 2015-03-09 ENCOUNTER — Encounter: Payer: Self-pay | Admitting: Internal Medicine

## 2015-03-09 DIAGNOSIS — R0600 Dyspnea, unspecified: Secondary | ICD-10-CM

## 2015-03-09 NOTE — Progress Notes (Signed)
SMW performed today. 

## 2015-03-12 ENCOUNTER — Telehealth: Payer: Self-pay | Admitting: *Deleted

## 2015-03-12 DIAGNOSIS — J449 Chronic obstructive pulmonary disease, unspecified: Secondary | ICD-10-CM

## 2015-03-12 NOTE — Telephone Encounter (Signed)
Pt informed of the need of O2 @ 2L with exertion and 2L QHS. Order placed.

## 2015-03-26 ENCOUNTER — Telehealth: Payer: Self-pay | Admitting: Pulmonary Disease

## 2015-03-26 NOTE — Telephone Encounter (Signed)
lmtcb for pt.  

## 2015-03-26 NOTE — Telephone Encounter (Signed)
Pt c/o Shortness Of Breath: STAT if SOB developed within the last 24 hours or pt is noticeably SOB on the phone  PER WIFE   1. Are you currently SOB (can you hear that pt is SOB on the phone)?  yes  2. How long have you been experiencing SOB? 2 weeks or longer  3. Are you SOB when sitting or when up moving around? Moving around calms slightly with rest and o2  4. Are you currently experiencing any other symptoms? Yes   No appetite coughing up sputum   Patient wants sooner appt with Dr. Sung Amabile only .  Patient is scheduled to see him on 03-28 and this is the first available on [place] schedule.   Patient wants to know if we can work him in.  Please call to discuss.

## 2015-03-27 ENCOUNTER — Ambulatory Visit (INDEPENDENT_AMBULATORY_CARE_PROVIDER_SITE_OTHER): Payer: Medicare Other | Admitting: Internal Medicine

## 2015-03-27 ENCOUNTER — Encounter: Payer: Self-pay | Admitting: Internal Medicine

## 2015-03-27 VITALS — BP 110/68 | HR 72 | Resp 98 | Ht 68.0 in | Wt 176.4 lb

## 2015-03-27 DIAGNOSIS — J438 Other emphysema: Secondary | ICD-10-CM

## 2015-03-27 DIAGNOSIS — K921 Melena: Secondary | ICD-10-CM

## 2015-03-27 MED ORDER — PREDNISONE 10 MG PO TABS: ORAL_TABLET | ORAL | Status: DC

## 2015-03-27 MED ORDER — DOXYCYCLINE HYCLATE 100 MG PO TABS: 100.0000 mg | ORAL_TABLET | Freq: Two times a day (BID) | ORAL | Status: DC

## 2015-03-27 NOTE — Telephone Encounter (Signed)
Attempted to contact patient, no answer, could not leave message. Sending message to [facility] for follow up today.. On [place]ick[place] list

## 2015-03-27 NOTE — Addendum Note (Signed)
Addended by: Meyer Cory R on: 03/27/2015 01:00 PM   Modules accepted: Orders

## 2015-03-27 NOTE — Patient Instructions (Addendum)
--  Doxycycline 100 mg bid for 5 days.  --Prednisone taper 10 mg tabs times 42, take 6 tabs for 2 days, then 5 for 2 days, then 4-3-2-1 tab for 2 days each.   Call back early next week with progress.   Will need to find out from daughter what dose of prednisone he is currently taking. If he is currently taking prednisone regularly, then this taper should be in addition to any prednisone he is currently taking, and that prednisone dose should be continued after the taper.

## 2015-03-27 NOTE — Telephone Encounter (Signed)
[phone] pt calling back

## 2015-03-27 NOTE — Progress Notes (Addendum)
[facility]    The patient is an 82 year old male with severe COPD on Anoro and ?Flovent, he comes in today for sick visit.   Assessment and Plan:  Acute exacerbation of COPD due to acute bronchitis. -We'll treat with a 12 day taper of steroids. The patient is uncertain if he is currently on a chronic dose of prednisone, we will need to find out from the patient's daughter, who administers his medications, regarding his regular prednisone dose. The taper should be in addition to any Baseline prednisone. He is taking. -Course of doxycycline for 5 days, avoided azithromycin as the patient is currently having some diarrhea.   Severe emphysema. -Continue Anoro inhaler, continue steroid inhaler, and nebulizers. -Uncertain as to whether his decline is due to worsening COPD, versus acute exacerbation. -As his respiratory status improves, we can consider pulmonary rehabilitation.  Acute on chronic respiratory failure. Debility, deconditioning. -I suspect that his emphysema is at or near end stage. We discussed some end-of-life issues, including CODE STATUS and living will. -He expressed that his quality of life has deteriorated as such that he would not want to be kept alive on artificial life support, his wife feels that he can improve and that he should strive to live longer. She does not appear to have a full grasp of how sick he is, as she does not appear to understand things like what COPD is, and she appears to have a misunderstanding that the oxygen was going to make his life better. Clearly we will need to discuss this further, I also discussed with the patient's wife at some point she may need to make these decisions for him, I encouraged her to discuss this with him and to respect his wishes.  Chest pain. -The patient has been taking some Lomotil, nitroglycerin over the last 1 week, I asked that he contact his cardiologist to see if any further workup is  necessary.  Diarrhea. -I did not see anything on his med list that could be causing this. However, given the presence of diarrhea. We'll use doxycycline instead of azithromycin. -I asked that he contact his primary care provider to see if any further workup is necessary.  ADDENDUM: While walking out of the office. The wife commented that agent has been having some blood in his stools, he also appeared pale today. We'll send him for a CBC, to rule this out as a cause of his dyspnea.  Date: 03/27/2015  MRN# [id number] Tom Nguyen 04/19/1932   Tom Nguyen is a 82 y.o. old male seen in follow up for chief complaint of  Chief Complaint  Patient presents with  . Acute Visit    DS pt. pt. c/o increased SOB, prod cough white to brown in color, occ wheezing, chest feels heavy. chills X 2wk. denies fever or sweats.     HPI:  Patient presents with his wife today, due to an approximately two-week history of dyspnea and cough. The patient notes that the symptoms were insidious in onset, there was no preceding chest infection or flulike illness. The symptoms have been persistent but nonprogressive since that time, there has been no improvement over the last 2 weeks.  He is taking anoro once daily, he has significant confusion about what medications he is on. He is on a nebulizer 3 to 4 times per day, he is unsure of the medications in them.  He may be taking flovent, but is not certain. He was recently started on albuterol inhaler  which he uses 3 times per day, he does not feel that it helps.  His wife notes that has used SL NTG on about 3 times because of dyspnea and chest pain. He did not call his cardiologist.   He has prednisone on his medication list, his daughter administers his meds so he is not sure if he is taking this.   His oxygen sat was 95% on 2L and HR 74; after walking 300 feet sat was 92% and HR 74. He had severe dyspnea, he walks slowly, he appeared steady.  Review of recent PFT  tracings and raw data from 11/28/14: Review of spirometry shows severe obstructive lung disease, with an FEV1 of 49%, there is no reversibility with bronchodilator therapy. Review of lung volume shows significant air trapping, in addition, there is severely reduced effusion capacity at 39%.  Interpretation: These appear to be consistent with  severe emphysema.  Chest x-ray images from 12/16/2014 reviewed, showed hyperinflation consistent with severe emphysema.  SIX MIN WALK 03/09/2015 03/05/2015  Medications pt states all breakfast meds. Daughter puts them out for him -  Supplimental Oxygen during Test? (L/min) No No  Laps 2 -  Partial Lap (in Meters) 24 -  Baseline BP (sitting) 118/76 -  Baseline Heartrate 67 -  Baseline Dyspnea (Borg Scale) 2 -  Baseline Fatigue (Borg Scale) 0 -  Baseline SPO2 97 -  BP (sitting) 126/72 -  Heartrate 82 -  Dyspnea (Borg Scale) 4 -  Fatigue (Borg Scale) 0 -  SPO2 84 -  BP (sitting) 120/72 -  Heartrate 82 -  SPO2 98 -  Stopped or Paused before Six Minutes Yes -  Other Symptoms at end of Exercise Stopped at 3 minutes due to O2 desat and chest tightness. -  Distance Completed 120 -  Tech Comments: pt walked at moderate pace till chest tightness and SOB -    Pulmonary Functions Testing Results:  TLC  Date Value Ref Range Status  11/28/2014 4.98 L Preliminary     Medication:   Outpatient Encounter Prescriptions as of 03/27/2015  Medication Sig  . acetaminophen (TYLENOL) 500 MG tablet Take 500 mg by mouth every 6 (six) hours as needed.  Marland Kitchen albuterol (PROVENTIL HFA;VENTOLIN HFA) 108 (90 Base) MCG/ACT inhaler Inhale 2 puffs into the lungs every 4 (four) hours as needed for wheezing or shortness of breath.  Marland Kitchen albuterol (PROVENTIL) (2.5 MG/3ML) 0.083% nebulizer solution Take 3 mLs (2.5 mg total) by nebulization every 4 (four) hours. For wheezing/SOB  . alendronate (FOSAMAX) 70 MG tablet Take 500 mg by mouth.   Marland Kitchen allopurinol (ZYLOPRIM) 100 MG tablet  Take 100 mg by mouth daily.   . carvedilol (COREG) 25 MG tablet Take 25 mg by mouth 2 (two) times daily with a meal.   . eplerenone (INSPRA) 50 MG tablet Take 50 mg by mouth daily.   . ferrous sulfate 325 (65 FE) MG tablet Take 1 tablet (325 mg total) by mouth 3 (three) times daily.  . folic acid (FOLVITE) 1 MG tablet Take 1 mg by mouth daily.   . furosemide (LASIX) 80 MG tablet Take 80 mg by mouth daily.   . hydroxychloroquine (PLAQUENIL) 200 MG tablet Take 200 mg by mouth 2 (two) times daily.   Marland Kitchen leflunomide (ARAVA) 20 MG tablet Take 20 mg by mouth daily.   Marland Kitchen levothyroxine (SYNTHROID, LEVOTHROID) 75 MCG tablet Take 75 mcg by mouth daily before breakfast.   . lisinopril (PRINIVIL,ZESTRIL) 20 MG tablet Take 1 tablet (20  mg total) by mouth daily.  . montelukast (SINGULAIR) 10 MG tablet   . Multiple Vitamins-Minerals (MULTIVITAMIN ADULT PO) Take by mouth daily.   . nitroGLYCERIN (NITROLINGUAL) 0.4 MG/SPRAY spray Place 1 spray under the tongue every 5 (five) minutes x 3 doses as needed for chest pain.  Marland Kitchen omeprazole (PRILOSEC) 20 MG capsule Take 20 mg by mouth daily.   . predniSONE (DELTASONE) 20 MG tablet Take 1 tablet (20 mg total) by mouth daily with breakfast.  . simvastatin (ZOCOR) 40 MG tablet Take 40 mg by mouth daily.   . traMADol (ULTRAM) 50 MG tablet Take 1 tablet (50 mg total) by mouth every 8 (eight) hours as needed.  Marland Kitchen Umeclidinium-Vilanterol (ANORO ELLIPTA) 62.5-25 MCG/INH AEPB Inhale 1 puff into the lungs daily.   No facility-administered encounter medications on file as of 03/27/2015.     Allergies:  Review of patient's allergies indicates no known allergies.  Review of Systems: Gen:  Denies  fever, sweats. HEENT: Denies blurred vision. Cvc:  No dizziness, chest  heaviness Resp:   Denies  sputum porduction. Gi: Denies swallowing difficulty, stomach pain. He does have loose stools. Gu:  Denies bladder incontinence, burning urine Ext:   No Joint pain, stiffness. Skin: No  skin rash, easy bruising. Endoc:  No polyuria, polydipsia. Psych: No depression, insomnia. Other:  All other systems were reviewed and found to be negative other than what is mentioned in the HPI.   Physical Examination:   VS: BP 110/68 mmHg  Pulse 72  Resp 98  Ht 5\' 8"  (1.727 m)  Wt 176 lb 6.4 oz (80.015 kg)  BMI 26.83 kg/m2  SpO2 96%  General Appearance: No distress  Neuro:without focal findings,  speech normal,  HEENT: PERRLA, EOM intact. Pulmonary: normal breath sounds, No wheezing.  Decreased bilaterally. CardiovascularNormal S1,S2.  No m/r/g.   Abdomen: Benign, Soft, non-tender. Renal:  No costovertebral tenderness  GU:  Not performed at this time. Endoc: No evident thyromegaly, no signs of acromegaly. Skin:   warm, no rash. Extremities: normal, no cyanosis, clubbing.   LABORATORY PANEL:   CBC No results for input(s): WBC, HGB, HCT, PLT in the last 168 hours. ------------------------------------------------------------------------------------------------------------------  Chemistries  No results for input(s): NA, K, CL, CO2, GLUCOSE, BUN, CREATININE, CALCIUM, MG, AST, ALT, ALKPHOS, BILITOT in the last 168 hours.  Invalid input(s): GFRCGP ------------------------------------------------------------------------------------------------------------------  Cardiac Enzymes No results for input(s): TROPONINI in the last 168 hours. ------------------------------------------------------------  RADIOLOGY:   No results found for this or any previous visit. Results for orders placed during the hospital encounter of 12/16/14  DG Chest 2 View   Narrative CLINICAL DATA:  Productive cough.  Shortness of breath.  EXAM: CHEST  2 VIEW  COMPARISON:  10/21/2014.  FINDINGS: Mediastinum and hilar structures normal. Prior CABG. AICD in stable position. Stable cardiomegaly with normal pulmonary vascularity . Lung volumes with mild bibasilar atelectasis. No pleural effusion  or pneumothorax. Bilateral shoulder replacements.  IMPRESSION: 1. Prior CABG.  AICD in stable position.  Stable cardiomegaly. 2. Low lung volumes with mild bibasilar atelectasis.   Electronically Signed   By: Maisie Fus  Register   On: 12/17/2014 07:47    ------------------------------------------------------------------------------------------------------------------  Thank  you for allowing [facility] to assist in the care of your patient. Our recommendations are noted above.  Please contact us if we can be of further service.   Wells Guiles, MD.  [facility]  Santiago Glad, M.D.  Stephanie Acre, M.D.  Billy Fischer, M.D  03/27/2015  

## 2015-03-27 NOTE — Telephone Encounter (Signed)
Spoke with pt and scheduled for 11:20am today. Nothing further needed.

## 2015-03-28 ENCOUNTER — Other Ambulatory Visit
Admission: AD | Admit: 2015-03-28 | Discharge: 2015-03-28 | Disposition: A | Discharge status: Home / Self Care | Payer: Medicare Other | Source: Ambulatory Visit | Attending: Internal Medicine | Admitting: Internal Medicine

## 2015-03-28 DIAGNOSIS — K921 Melena: Secondary | ICD-10-CM | POA: Diagnosis present

## 2015-03-28 LAB — CBC WITH DIFFERENTIAL/PLATELET
BASOS ABS: 0 10*3/uL (ref 0–0.1)
BASOS PCT: 1 %
EOS PCT: 0 %
Eosinophils Absolute: 0 10*3/uL (ref 0–0.7)
HCT: 31.7 % — ABNORMAL LOW (ref 40.0–52.0)
Hemoglobin: 10.5 g/dL — ABNORMAL LOW (ref 13.0–18.0)
Lymphocytes Relative: 3 %
Lymphs Abs: 0.1 10*3/uL — ABNORMAL LOW (ref 1.0–3.6)
MCH: 30.6 pg (ref 26.0–34.0)
MCHC: 33.1 g/dL (ref 32.0–36.0)
MCV: 92.4 fL (ref 80.0–100.0)
MONO ABS: 0.3 10*3/uL (ref 0.2–1.0)
Monocytes Relative: 6 %
Neutro Abs: 4.7 10*3/uL (ref 1.4–6.5)
Neutrophils Relative %: 90 %
PLATELETS: 155 10*3/uL (ref 150–440)
RBC: 3.42 MIL/uL — ABNORMAL LOW (ref 4.40–5.90)
RDW: 15.7 % — AB (ref 11.5–14.5)
WBC: 5.2 10*3/uL (ref 3.8–10.6)

## 2015-03-30 ENCOUNTER — Telehealth: Payer: Self-pay | Admitting: Internal Medicine

## 2015-03-30 NOTE — Telephone Encounter (Signed)
DR, Lorain Childes in regards to pt. Thanks

## 2015-03-30 NOTE — Telephone Encounter (Signed)
Pt states he is not taking a steriod, and he had his labwork done on Saturday 3/11.

## 2015-04-06 ENCOUNTER — Ambulatory Visit (INDEPENDENT_AMBULATORY_CARE_PROVIDER_SITE_OTHER): Payer: Medicare Other | Admitting: *Deleted

## 2015-04-06 ENCOUNTER — Telehealth: Payer: Self-pay | Admitting: Internal Medicine

## 2015-04-06 ENCOUNTER — Ambulatory Visit: Payer: Medicare Other | Admitting: Internal Medicine

## 2015-04-06 DIAGNOSIS — I255 Ischemic cardiomyopathy: Secondary | ICD-10-CM

## 2015-04-06 NOTE — Telephone Encounter (Signed)
Pt calling stating he would like to get a script for his oxygen machine  He said he called last time but provider was out Please advise

## 2015-04-06 NOTE — Telephone Encounter (Signed)
Paper signed by provider for the [employer] machine. Will fax to company. Spoke with pt and informed him paper being faxed. Nothing further needed.

## 2015-04-06 NOTE — Telephone Encounter (Signed)
Patient calling to get a prescription for a POC that he saw in an ad in the Sunday paper.  He said that his daughter, Judeth Cornfield, spoke with someone regarding the tank that patient needs, but he did not know who she spoke with.  Called patient's daughter and left a message for her to call Misty back with details about the POC he is requesting.  DME: [facility]

## 2015-04-07 NOTE — Progress Notes (Signed)
Remote ICD transmission.   

## 2015-04-13 ENCOUNTER — Encounter: Payer: Self-pay | Admitting: Emergency Medicine

## 2015-04-13 ENCOUNTER — Emergency Department: Payer: Medicare Other

## 2015-04-13 ENCOUNTER — Inpatient Hospital Stay
Admission: EM | Admit: 2015-04-13 | Discharge: 2015-04-16 | DRG: 291 | Disposition: A | Discharge status: Home Health | Payer: Medicare Other | Attending: Internal Medicine | Admitting: Internal Medicine

## 2015-04-13 DIAGNOSIS — Z87891 Personal history of nicotine dependence: Secondary | ICD-10-CM | POA: Diagnosis not present

## 2015-04-13 DIAGNOSIS — I252 Old myocardial infarction: Secondary | ICD-10-CM

## 2015-04-13 DIAGNOSIS — M1990 Unspecified osteoarthritis, unspecified site: Secondary | ICD-10-CM | POA: Diagnosis present

## 2015-04-13 DIAGNOSIS — D500 Iron deficiency anemia secondary to blood loss (chronic): Secondary | ICD-10-CM | POA: Diagnosis present

## 2015-04-13 DIAGNOSIS — I447 Left bundle-branch block, unspecified: Secondary | ICD-10-CM | POA: Diagnosis present

## 2015-04-13 DIAGNOSIS — I5023 Acute on chronic systolic (congestive) heart failure: Secondary | ICD-10-CM | POA: Diagnosis present

## 2015-04-13 DIAGNOSIS — M810 Age-related osteoporosis without current pathological fracture: Secondary | ICD-10-CM | POA: Diagnosis present

## 2015-04-13 DIAGNOSIS — Z96612 Presence of left artificial shoulder joint: Secondary | ICD-10-CM | POA: Diagnosis present

## 2015-04-13 DIAGNOSIS — Z7951 Long term (current) use of inhaled steroids: Secondary | ICD-10-CM

## 2015-04-13 DIAGNOSIS — K219 Gastro-esophageal reflux disease without esophagitis: Secondary | ICD-10-CM | POA: Diagnosis present

## 2015-04-13 DIAGNOSIS — Z9981 Dependence on supplemental oxygen: Secondary | ICD-10-CM

## 2015-04-13 DIAGNOSIS — Z823 Family history of stroke: Secondary | ICD-10-CM | POA: Diagnosis not present

## 2015-04-13 DIAGNOSIS — I509 Heart failure, unspecified: Secondary | ICD-10-CM

## 2015-04-13 DIAGNOSIS — Z841 Family history of disorders of kidney and ureter: Secondary | ICD-10-CM | POA: Diagnosis not present

## 2015-04-13 DIAGNOSIS — D696 Thrombocytopenia, unspecified: Secondary | ICD-10-CM | POA: Diagnosis present

## 2015-04-13 DIAGNOSIS — E039 Hypothyroidism, unspecified: Secondary | ICD-10-CM | POA: Diagnosis present

## 2015-04-13 DIAGNOSIS — J441 Chronic obstructive pulmonary disease with (acute) exacerbation: Secondary | ICD-10-CM | POA: Diagnosis present

## 2015-04-13 DIAGNOSIS — Z66 Do not resuscitate: Secondary | ICD-10-CM | POA: Diagnosis present

## 2015-04-13 DIAGNOSIS — Z7952 Long term (current) use of systemic steroids: Secondary | ICD-10-CM

## 2015-04-13 DIAGNOSIS — Z950 Presence of cardiac pacemaker: Secondary | ICD-10-CM

## 2015-04-13 DIAGNOSIS — J9601 Acute respiratory failure with hypoxia: Secondary | ICD-10-CM | POA: Diagnosis present

## 2015-04-13 DIAGNOSIS — Z96611 Presence of right artificial shoulder joint: Secondary | ICD-10-CM | POA: Diagnosis present

## 2015-04-13 DIAGNOSIS — N183 Chronic kidney disease, stage 3 (moderate): Secondary | ICD-10-CM | POA: Diagnosis present

## 2015-04-13 DIAGNOSIS — Z8249 Family history of ischemic heart disease and other diseases of the circulatory system: Secondary | ICD-10-CM | POA: Diagnosis not present

## 2015-04-13 DIAGNOSIS — E785 Hyperlipidemia, unspecified: Secondary | ICD-10-CM | POA: Diagnosis present

## 2015-04-13 DIAGNOSIS — I255 Ischemic cardiomyopathy: Secondary | ICD-10-CM | POA: Diagnosis present

## 2015-04-13 DIAGNOSIS — J45909 Unspecified asthma, uncomplicated: Secondary | ICD-10-CM | POA: Diagnosis present

## 2015-04-13 DIAGNOSIS — M069 Rheumatoid arthritis, unspecified: Secondary | ICD-10-CM | POA: Diagnosis present

## 2015-04-13 DIAGNOSIS — Z96653 Presence of artificial knee joint, bilateral: Secondary | ICD-10-CM | POA: Diagnosis present

## 2015-04-13 DIAGNOSIS — M109 Gout, unspecified: Secondary | ICD-10-CM | POA: Diagnosis present

## 2015-04-13 DIAGNOSIS — G4733 Obstructive sleep apnea (adult) (pediatric): Secondary | ICD-10-CM | POA: Diagnosis present

## 2015-04-13 DIAGNOSIS — I248 Other forms of acute ischemic heart disease: Secondary | ICD-10-CM | POA: Diagnosis present

## 2015-04-13 DIAGNOSIS — Z79899 Other long term (current) drug therapy: Secondary | ICD-10-CM | POA: Diagnosis not present

## 2015-04-13 DIAGNOSIS — I2510 Atherosclerotic heart disease of native coronary artery without angina pectoris: Secondary | ICD-10-CM | POA: Diagnosis present

## 2015-04-13 DIAGNOSIS — I214 Non-ST elevation (NSTEMI) myocardial infarction: Secondary | ICD-10-CM

## 2015-04-13 DIAGNOSIS — I130 Hypertensive heart and chronic kidney disease with heart failure and stage 1 through stage 4 chronic kidney disease, or unspecified chronic kidney disease: Principal | ICD-10-CM | POA: Diagnosis present

## 2015-04-13 DIAGNOSIS — Z951 Presence of aortocoronary bypass graft: Secondary | ICD-10-CM | POA: Diagnosis not present

## 2015-04-13 DIAGNOSIS — J9690 Respiratory failure, unspecified, unspecified whether with hypoxia or hypercapnia: Secondary | ICD-10-CM | POA: Diagnosis present

## 2015-04-13 LAB — COMPREHENSIVE METABOLIC PANEL
ALBUMIN: 3.6 g/dL (ref 3.5–5.0)
ALK PHOS: 38 U/L (ref 38–126)
ALT: 26 U/L (ref 17–63)
ANION GAP: 10 (ref 5–15)
AST: 26 U/L (ref 15–41)
BILIRUBIN TOTAL: 0.8 mg/dL (ref 0.3–1.2)
BUN: 25 mg/dL — AB (ref 6–20)
CALCIUM: 8.1 mg/dL — AB (ref 8.9–10.3)
CO2: 27 mmol/L (ref 22–32)
Chloride: 100 mmol/L — ABNORMAL LOW (ref 101–111)
Creatinine, Ser: 1.02 mg/dL (ref 0.61–1.24)
GFR calc Af Amer: >60 mL/min (ref >60)
GFR calc non Af Amer: >60 mL/min (ref >60)
GLUCOSE: 121 mg/dL — AB (ref 65–99)
POTASSIUM: 4 mmol/L (ref 3.5–5.1)
SODIUM: 137 mmol/L (ref 135–145)
Total Protein: 5.8 g/dL — ABNORMAL LOW (ref 6.5–8.1)

## 2015-04-13 LAB — CBC WITH DIFFERENTIAL/PLATELET
BASOS ABS: 0 10*3/uL (ref 0–0.1)
BASOS PCT: 0 %
EOS ABS: 0 10*3/uL (ref 0–0.7)
Eosinophils Relative: 0 %
HEMATOCRIT: 33.4 % — AB (ref 40.0–52.0)
HEMOGLOBIN: 11.1 g/dL — AB (ref 13.0–18.0)
Lymphocytes Relative: 7 %
Lymphs Abs: 0.5 10*3/uL — ABNORMAL LOW (ref 1.0–3.6)
MCH: 30.8 pg (ref 26.0–34.0)
MCHC: 33.2 g/dL (ref 32.0–36.0)
MCV: 92.8 fL (ref 80.0–100.0)
MONOS PCT: 12 %
Monocytes Absolute: 0.9 10*3/uL (ref 0.2–1.0)
NEUTROS ABS: 5.7 10*3/uL (ref 1.4–6.5)
NEUTROS PCT: 81 %
Platelets: 111 10*3/uL — ABNORMAL LOW (ref 150–440)
RBC: 3.6 MIL/uL — AB (ref 4.40–5.90)
RDW: 16.8 % — ABNORMAL HIGH (ref 11.5–14.5)
WBC: 7.2 10*3/uL (ref 3.8–10.6)

## 2015-04-13 LAB — CBC
HCT: 33.4 % — ABNORMAL LOW (ref 40.0–52.0)
HEMOGLOBIN: 11.1 g/dL — AB (ref 13.0–18.0)
MCH: 31.2 pg (ref 26.0–34.0)
MCHC: 33.1 g/dL (ref 32.0–36.0)
MCV: 94.1 fL (ref 80.0–100.0)
PLATELETS: 93 10*3/uL — AB (ref 150–440)
RBC: 3.55 MIL/uL — ABNORMAL LOW (ref 4.40–5.90)
RDW: 17.2 % — AB (ref 11.5–14.5)
WBC: 4.3 10*3/uL (ref 3.8–10.6)

## 2015-04-13 LAB — CREATININE, SERUM
CREATININE: 1.09 mg/dL (ref 0.61–1.24)
GFR calc Af Amer: >60 mL/min (ref >60)

## 2015-04-13 LAB — BRAIN NATRIURETIC PEPTIDE: B Natriuretic Peptide: >4500 pg/mL — ABNORMAL HIGH (ref 0.0–100.0)

## 2015-04-13 LAB — TROPONIN I
Troponin I: 0.07 ng/mL — ABNORMAL HIGH (ref <0.031)
Troponin I: 0.05 ng/mL — ABNORMAL HIGH (ref <0.031)
Troponin I: 0.03 ng/mL (ref <0.031)

## 2015-04-13 MED ORDER — SODIUM CHLORIDE 0.9% FLUSH
3.0000 mL | Freq: Two times a day (BID) | INTRAVENOUS | Status: DC
  Administered 2015-04-13 – 2015-04-16 (×7): 3 mL via INTRAVENOUS

## 2015-04-13 MED ORDER — IPRATROPIUM-ALBUTEROL 0.5-2.5 (3) MG/3ML IN SOLN
3.0000 mL | Freq: Once | RESPIRATORY_TRACT | Status: AC
  Administered 2015-04-13: 3 mL via RESPIRATORY_TRACT
  Filled 2015-04-13: qty 3

## 2015-04-13 MED ORDER — SODIUM CHLORIDE 0.9 % IV SOLN: 250.0000 mL | INTRAVENOUS | Status: DC | PRN

## 2015-04-13 MED ORDER — LEVOFLOXACIN IN D5W 750 MG/150ML IV SOLN
750.0000 mg | Freq: Once | INTRAVENOUS | Status: AC
  Administered 2015-04-13: 750 mg via INTRAVENOUS
  Filled 2015-04-13: qty 150

## 2015-04-13 MED ORDER — ADULT MULTIVITAMIN W/MINERALS CH
ORAL_TABLET | Freq: Every day | ORAL | Status: DC
  Administered 2015-04-13 – 2015-04-16 (×4): 1 via ORAL
  Filled 2015-04-13 (×4): qty 1

## 2015-04-13 MED ORDER — IPRATROPIUM-ALBUTEROL 0.5-2.5 (3) MG/3ML IN SOLN
RESPIRATORY_TRACT | Status: AC
  Administered 2015-04-13: 3 mL via RESPIRATORY_TRACT
  Filled 2015-04-13: qty 3

## 2015-04-13 MED ORDER — LEVOFLOXACIN IN D5W 750 MG/150ML IV SOLN
750.0000 mg | INTRAVENOUS | Status: DC
  Filled 2015-04-13: qty 150

## 2015-04-13 MED ORDER — IPRATROPIUM-ALBUTEROL 0.5-2.5 (3) MG/3ML IN SOLN
3.0000 mL | Freq: Once | RESPIRATORY_TRACT | Status: AC
  Administered 2015-04-13 (×2): 3 mL via RESPIRATORY_TRACT
  Filled 2015-04-13: qty 3

## 2015-04-13 MED ORDER — ACETAMINOPHEN 650 MG RE SUPP: 650.0000 mg | Freq: Four times a day (QID) | RECTAL | Status: DC | PRN

## 2015-04-13 MED ORDER — NITROGLYCERIN 0.4 MG SL SUBL: 0.4000 mg | SUBLINGUAL_TABLET | SUBLINGUAL | Status: DC | PRN

## 2015-04-13 MED ORDER — SODIUM CHLORIDE 0.9% FLUSH
3.0000 mL | Freq: Two times a day (BID) | INTRAVENOUS | Status: DC
  Administered 2015-04-13 – 2015-04-16 (×6): 3 mL via INTRAVENOUS

## 2015-04-13 MED ORDER — LEFLUNOMIDE 20 MG PO TABS
20.0000 mg | ORAL_TABLET | Freq: Every day | ORAL | Status: DC
  Administered 2015-04-13 – 2015-04-16 (×4): 20 mg via ORAL
  Filled 2015-04-13 (×5): qty 1

## 2015-04-13 MED ORDER — SIMVASTATIN 40 MG PO TABS
40.0000 mg | ORAL_TABLET | Freq: Every day | ORAL | Status: DC
  Administered 2015-04-13 – 2015-04-16 (×4): 40 mg via ORAL
  Filled 2015-04-13 (×4): qty 1

## 2015-04-13 MED ORDER — UMECLIDINIUM-VILANTEROL 62.5-25 MCG/INH IN AEPB
1.0000 | INHALATION_SPRAY | Freq: Every day | RESPIRATORY_TRACT | Status: DC
  Administered 2015-04-13 – 2015-04-16 (×4): 1 via RESPIRATORY_TRACT
  Filled 2015-04-13: qty 14

## 2015-04-13 MED ORDER — FERROUS SULFATE 325 (65 FE) MG PO TABS
325.0000 mg | ORAL_TABLET | Freq: Three times a day (TID) | ORAL | Status: DC
  Administered 2015-04-13 – 2015-04-15 (×2): 325 mg via ORAL
  Filled 2015-04-13 (×5): qty 1

## 2015-04-13 MED ORDER — LEVOTHYROXINE SODIUM 75 MCG PO TABS
75.0000 ug | ORAL_TABLET | Freq: Every day | ORAL | Status: DC
  Administered 2015-04-14 – 2015-04-16 (×3): 75 ug via ORAL
  Filled 2015-04-13 (×3): qty 1

## 2015-04-13 MED ORDER — METHYLPREDNISOLONE SODIUM SUCC 125 MG IJ SOLR
INTRAMUSCULAR | Status: AC
  Administered 2015-04-13: 125 mg
  Filled 2015-04-13: qty 2

## 2015-04-13 MED ORDER — FUROSEMIDE 10 MG/ML IJ SOLN
20.0000 mg | Freq: Two times a day (BID) | INTRAMUSCULAR | Status: DC
  Administered 2015-04-13 – 2015-04-15 (×4): 20 mg via INTRAVENOUS
  Filled 2015-04-13 (×5): qty 2

## 2015-04-13 MED ORDER — ACETAMINOPHEN 325 MG PO TABS: 650.0000 mg | ORAL_TABLET | Freq: Four times a day (QID) | ORAL | Status: DC | PRN

## 2015-04-13 MED ORDER — HYDROXYCHLOROQUINE SULFATE 200 MG PO TABS
200.0000 mg | ORAL_TABLET | Freq: Two times a day (BID) | ORAL | Status: DC
  Administered 2015-04-13 – 2015-04-16 (×7): 200 mg via ORAL
  Filled 2015-04-13 (×7): qty 1

## 2015-04-13 MED ORDER — ALLOPURINOL 100 MG PO TABS
100.0000 mg | ORAL_TABLET | Freq: Every day | ORAL | Status: DC
  Administered 2015-04-13 – 2015-04-16 (×4): 100 mg via ORAL
  Filled 2015-04-13 (×4): qty 1

## 2015-04-13 MED ORDER — ENOXAPARIN SODIUM 40 MG/0.4ML ~~LOC~~ SOLN
40.0000 mg | SUBCUTANEOUS | Status: DC
  Administered 2015-04-13: 40 mg via SUBCUTANEOUS
  Filled 2015-04-13: qty 0.4

## 2015-04-13 MED ORDER — NITROGLYCERIN 0.4 MG/SPRAY TL SOLN: 1.0000 | Status: DC | PRN

## 2015-04-13 MED ORDER — ONDANSETRON HCL 4 MG/2ML IJ SOLN: 4.0000 mg | Freq: Four times a day (QID) | INTRAMUSCULAR | Status: DC | PRN

## 2015-04-13 MED ORDER — FOLIC ACID 1 MG PO TABS
1.0000 mg | ORAL_TABLET | Freq: Every day | ORAL | Status: DC
  Administered 2015-04-13 – 2015-04-16 (×4): 1 mg via ORAL
  Filled 2015-04-13 (×4): qty 1

## 2015-04-13 MED ORDER — CARVEDILOL 12.5 MG PO TABS
25.0000 mg | ORAL_TABLET | Freq: Two times a day (BID) | ORAL | Status: DC
  Administered 2015-04-13 – 2015-04-15 (×4): 25 mg via ORAL
  Filled 2015-04-13 (×5): qty 2

## 2015-04-13 MED ORDER — SENNOSIDES-DOCUSATE SODIUM 8.6-50 MG PO TABS
1.0000 | ORAL_TABLET | Freq: Every evening | ORAL | Status: DC | PRN
  Administered 2015-04-15: 1 via ORAL
  Filled 2015-04-13: qty 1

## 2015-04-13 MED ORDER — ASPIRIN 81 MG PO CHEW
324.0000 mg | CHEWABLE_TABLET | Freq: Once | ORAL | Status: AC
  Administered 2015-04-13: 324 mg via ORAL
  Filled 2015-04-13: qty 4

## 2015-04-13 MED ORDER — MONTELUKAST SODIUM 10 MG PO TABS
10.0000 mg | ORAL_TABLET | Freq: Every day | ORAL | Status: DC
  Administered 2015-04-13 – 2015-04-15 (×3): 10 mg via ORAL
  Filled 2015-04-13 (×3): qty 1

## 2015-04-13 MED ORDER — ONDANSETRON HCL 4 MG PO TABS: 4.0000 mg | ORAL_TABLET | Freq: Four times a day (QID) | ORAL | Status: DC | PRN

## 2015-04-13 MED ORDER — PANTOPRAZOLE SODIUM 40 MG PO TBEC
40.0000 mg | DELAYED_RELEASE_TABLET | Freq: Every day | ORAL | Status: DC
  Administered 2015-04-13 – 2015-04-16 (×4): 40 mg via ORAL
  Filled 2015-04-13 (×5): qty 1

## 2015-04-13 MED ORDER — IPRATROPIUM-ALBUTEROL 0.5-2.5 (3) MG/3ML IN SOLN
3.0000 mL | RESPIRATORY_TRACT | Status: DC
  Administered 2015-04-13 – 2015-04-16 (×13): 3 mL via RESPIRATORY_TRACT
  Filled 2015-04-13 (×15): qty 3

## 2015-04-13 MED ORDER — SODIUM CHLORIDE 0.9% FLUSH: 3.0000 mL | INTRAVENOUS | Status: DC | PRN

## 2015-04-13 MED ORDER — SPIRONOLACTONE 25 MG PO TABS
25.0000 mg | ORAL_TABLET | Freq: Every day | ORAL | Status: DC
  Administered 2015-04-13 – 2015-04-16 (×4): 25 mg via ORAL
  Filled 2015-04-13 (×4): qty 1

## 2015-04-13 MED ORDER — LISINOPRIL 20 MG PO TABS
20.0000 mg | ORAL_TABLET | Freq: Every day | ORAL | Status: DC
  Administered 2015-04-13 – 2015-04-16 (×4): 20 mg via ORAL
  Filled 2015-04-13 (×4): qty 1

## 2015-04-13 MED ORDER — METHYLPREDNISOLONE SODIUM SUCC 125 MG IJ SOLR
60.0000 mg | Freq: Three times a day (TID) | INTRAMUSCULAR | Status: DC
  Administered 2015-04-13 – 2015-04-14 (×3): 60 mg via INTRAVENOUS
  Filled 2015-04-13 (×3): qty 2

## 2015-04-13 NOTE — ED Notes (Signed)
Pt seen by his pulmonologist 0n 03/27/15 and started on him home O2 and doxycycline and steroids. This morning pt was having increasing difficulty breathing and his daughter checked his O2 sats were 82 - 85%. P5t is working to breath with use of accessory muscles noted. 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

## 2015-04-13 NOTE — H&P (Addendum)
[facility] [place] at [facility]   PATIENT NAME: Tom Nguyen    MR#:  [id number]  DATE OF BIRTH:  10-21-1932  DATE OF ADMISSION:  04/13/2015  PRIMARY CARE PHYSICIAN: [name], DO   REQUESTING/REFERRING PHYSICIAN:  Dr Inocencio Homes  CHIEF COMPLAINT:   SOB, cough For past several days HISTORY OF PRESENT ILLNESS:  Tom Nguyen  is a 82 y.o. male with a known history of COPD and ischemic cardiomyopathy EF 35% who presents with above complaint. Patient reports over the past 3-4 days he's had increasing shortness of breath and cough. He is also noted to have wheezing. In the emergency room history treated for COPD exacerbation with DuoNeb's and IV steroids. He continues to have hypoxia and feelings of tightness in his chest. He denies chest pain. He was started on doxycycline and steroids by his primary care physician a few weeks ago.  PAST MEDICAL HISTORY:   Past Medical History  Diagnosis Date  . Asthma   . Arthritis   . Blood in stool   . Emphysema of lung (HCC)   . Hypertension   . Hyperlipidemia   . Heart disease   . Thyroid disease   . Ischemic cardiomyopathy   . CHF (congestive heart failure) (HCC)   . LBBB (left bundle branch block)   . COPD (chronic obstructive pulmonary disease) (HCC)   . Sleep apnea   . MI (myocardial infarction) (HCC) 1995    PAST SURGICAL HISTORY:   Past Surgical History  Procedure Laterality Date  . Coronary artery bypass graft  1995    5 vessels  . Pacemaker insertion  2002  . Reverse shoulder arthroplasty Right 2013  . Reverse total shoulder arthroplasty Left 2014  . Total knee arthroplasty  2009    both knees  . Cardiac catheterization    . Insert / replace / remove pacemaker      Research officer, political party     SOCIAL HISTORY:   Social History  Substance Use Topics  . Smoking status: Former Smoker    Quit date: 02/15/1993  . Smokeless tobacco: Never Used  . Alcohol Use: 0.0 - 0.6 oz/week    0-1 Standard  drinks or equivalent per week    FAMILY HISTORY:   Family History  Problem Relation Age of Onset  . Hyperlipidemia Father   . Stroke Father   . Hypertension Father   . Kidney disease Father     DRUG ALLERGIES:  No Known Allergies   REVIEW OF SYSTEMS:  CONSTITUTIONAL: No fever, fatigue or weakness.  EYES: No blurred or double vision.  EARS, NOSE, AND THROAT: No tinnitus or ear pain.  RESPIRATORY: Positive cough, shortness of breath, wheezing no hemoptysis.  CARDIOVASCULAR: No chest pain, orthopnea, positive lower extremity edema.  GASTROINTESTINAL: No nausea, vomiting, diarrhea or abdominal pain.  GENITOURINARY: No dysuria, hematuria.  ENDOCRINE: No polyuria, nocturia,  HEMATOLOGY: No anemia, easy bruising or bleeding SKIN: No rash or lesion. MUSCULOSKELETAL: No joint pain or arthritis.   NEUROLOGIC: No tingling, numbness, weakness.  PSYCHIATRY: No anxiety or depression.   MEDICATIONS AT HOME:   Prior to Admission medications   Medication Sig Start Date End Date Taking? Authorizing Provider  acetaminophen (TYLENOL) 500 MG tablet Take 500 mg by mouth every 6 (six) hours as needed.    Historical Provider, MD  albuterol (PROVENTIL HFA;VENTOLIN HFA) 108 (90 Base) MCG/ACT inhaler Inhale 2 puffs into the lungs every 4 (four) hours as needed for wheezing or shortness of  breath. 03/05/15   Erin Fulling, MD  albuterol (PROVENTIL) (2.5 MG/3ML) 0.083% nebulizer solution Take 3 mLs (2.5 mg total) by nebulization every 4 (four) hours. For wheezing/SOB 03/05/15   Erin Fulling, MD  alendronate (FOSAMAX) 70 MG tablet Take 500 mg by mouth.  08/25/14   Historical Provider, MD  allopurinol (ZYLOPRIM) 100 MG tablet Take 100 mg by mouth daily.  08/25/14   Historical Provider, MD  carvedilol (COREG) 25 MG tablet Take 25 mg by mouth 2 (two) times daily with a meal.  09/26/14   Historical Provider, MD  doxycycline (VIBRA-TABS) 100 MG tablet Take 1 tablet (100 mg total) by mouth 2 (two) times daily. X 5 days  03/27/15   Shane Crutch, MD  eplerenone (INSPRA) 50 MG tablet Take 50 mg by mouth daily.  08/12/14   Historical Provider, MD  ferrous sulfate 325 (65 FE) MG tablet Take 1 tablet (325 mg total) by mouth 3 (three) times daily. 10/23/14   [name], DO  folic acid (FOLVITE) 1 MG tablet Take 1 mg by mouth daily.  08/25/14   Historical Provider, MD  furosemide (LASIX) 80 MG tablet Take 80 mg by mouth daily.  09/17/14   Historical Provider, MD  hydroxychloroquine (PLAQUENIL) 200 MG tablet Take 200 mg by mouth 2 (two) times daily.  09/26/14   Historical Provider, MD  leflunomide (ARAVA) 20 MG tablet Take 20 mg by mouth daily.  08/25/14   Historical Provider, MD  levothyroxine (SYNTHROID, LEVOTHROID) 75 MCG tablet Take 75 mcg by mouth daily before breakfast.  09/17/14   Historical Provider, MD  lisinopril (PRINIVIL,ZESTRIL) 20 MG tablet Take 1 tablet (20 mg total) by mouth daily. 11/04/14   [name], DO  montelukast (SINGULAIR) 10 MG tablet  01/25/15   Historical Provider, MD  Multiple Vitamins-Minerals (MULTIVITAMIN ADULT PO) Take by mouth daily.     Historical Provider, MD  nitroGLYCERIN (NITROLINGUAL) 0.4 MG/SPRAY spray Place 1 spray under the tongue every 5 (five) minutes x 3 doses as needed for chest pain.    Historical Provider, MD  omeprazole (PRILOSEC) 20 MG capsule Take 20 mg by mouth daily.  09/17/14   Historical Provider, MD  predniSONE (DELTASONE) 10 MG tablet Take 6 tabs x 2 days, 5 tabs x 2 days, 4 tabs x 2 days, 3 tabs x 2 days, 2 tabs x 2 days, 1 tab x 2 days 03/27/15   Shane Crutch, MD  predniSONE (DELTASONE) 20 MG tablet Take 1 tablet (20 mg total) by mouth daily with breakfast. Patient not taking: Reported on 03/27/2015 12/22/14   Antonieta Iba, MD  simvastatin (ZOCOR) 40 MG tablet Take 40 mg by mouth daily.  10/02/14   Historical Provider, MD  traMADol (ULTRAM) 50 MG tablet Take 1 tablet (50 mg total) by mouth every 8 (eight) hours as needed. 10/23/14   [name], DO   Umeclidinium-Vilanterol (ANORO ELLIPTA) 62.5-25 MCG/INH AEPB Inhale 1 puff into the lungs daily. 12/04/14   Merwyn Katos, MD      VITAL SIGNS:  Blood pressure 138/92, pulse 81, temperature 97.9 F (36.6 C), temperature source Oral, resp. rate 22, height 5\' 8"  (1.727 m), weight 74.844 kg (165 lb), SpO2 99 %.  PHYSICAL EXAMINATION:  GENERAL:  82 y.o.-year-old patient lying in the bed with no acute distress.  EYES: Pupils equal, round, reactive to light and accommodation. No scleral icterus. Extraocular muscles intact.  HEENT: Head atraumatic, normocephalic. Oropharynx and nasopharynx clear.  NECK:  Supple, no jugular  venous distention. No thyroid enlargement, no tenderness.  LUNGS: Patient sounds very tight with decreased air movement throughout. no wheezing, rales,rhonchi or crepitation. No use of accessory muscles of respiration.  CARDIOVASCULAR: S1, S2 normal. No murmurs, rubs, or gallops.  ABDOMEN: Soft, nontender, nondistended. Bowel sounds present. No organomegaly or mass.  EXTREMITIES: 2+ pedal edema, no cyanosis, or clubbing.  NEUROLOGIC: Cranial nerves II through XII are grossly intact. No focal deficits. PSYCHIATRIC: The patient is alert and oriented x 3.  SKIN: No obvious rash, lesion, or ulcer.   LABORATORY PANEL:   CBC  Recent Labs Lab 04/13/15 0747  WBC 7.2  HGB 11.1*  HCT 33.4*  PLT 111*   ------------------------------------------------------------------------------------------------------------------  Chemistries   Recent Labs Lab 04/13/15 0747  NA 137  K 4.0  CL 100*  CO2 27  GLUCOSE 121*  BUN 25*  CREATININE 1.02  CALCIUM 8.1*  AST 26  ALT 26  ALKPHOS 38  BILITOT 0.8   ------------------------------------------------------------------------------------------------------------------  Cardiac Enzymes  Recent Labs Lab 04/13/15 0747  TROPONINI 0.07*    ------------------------------------------------------------------------------------------------------------------  RADIOLOGY:  Dg Chest 2 View  04/13/2015  CLINICAL DATA:  Shortness of breath for few days EXAM: CHEST  2 VIEW COMPARISON:  12/16/2014 FINDINGS: Cardiac shadow is mildly enlarged but stable. A defibrillator is again seen in stable position. The lungs are well aerated bilaterally. Mild thickening of the minor fissure on the right is seen. No focal infiltrate or sizable effusion is noted. IMPRESSION: No acute abnormality noted. Electronically Signed   By: Alcide Clever M.D.   On: 04/13/2015 08:06    EKG:   Ventricular paced  IMPRESSION AND PLAN:   82 year old male with a history of COPD and ischemic cardiomyopathy EF of 35% to presents with acute hypoxic respiratory failure.  1. Acute hypoxic respiratory failure: This is a combination of CHF exacerbation and COPD exacerbation. Plan as outlined below.  2. Acute on chronic systolic heart failure: Patient presented shortness of breath and elevated BNP consistent with CHF exacerbation. Start IV Lasix and monitor input and output. Continue Coreg and lisinopril.  3. Acute COPD exacerbation: Continue IV steroids, nebulizer and outpatient inhaler. Continue Levaquin. 4. Hypothyroidism: Continue Synthroid.  5. Hyperlipidemia: Continue Zocor. 6. Elevated troponin: Continue to echo cardiac enzymes to rule out non-ST elevation MI and patient needs telemetry monitoring.   7. OSA; Needs CPAP at night with O2.   All the records are reviewed and case discussed with ED provider. Management plans discussed with the patient and he is in agreement.  CODE STATUS: DNR  TOTAL TIME TAKING CARE OF THIS PATIENT: 50 minutes.   D/w daughter  Nyja Westbrook M.D on 04/13/2015 at 11:32 AM  Between 7am to 6pm - Pager - [phone] After 6pm go to [email or web address] - password [email or web address] [facility]  [place] [facility]  Office   [phone]  CC: Primary care physician; [name], DO

## 2015-04-13 NOTE — ED Notes (Signed)
Pt given turkey sandwich tray 

## 2015-04-13 NOTE — ED Provider Notes (Signed)
[facility] Emergency Department Provider Note  ____________________________________________  Time seen: Approximately 7:21 AM  I have reviewed the triage vital signs and the nursing notes.   HISTORY  Chief Complaint Shortness of Breath    HPI Tom Nguyen is a 82 y.o. male with history of end-stage COPD with 2 L intermittent home oxygen requirement, coronary artery disease as post CABG, ischemic myopathy with EF of 28% status post pacemaker who presents for evaluation of worsening shortness of breath over the past 1-2 weeks, severe today, constant since onset, worse with exertion. Patient reports these been seen by his primary care doctor for worsening shortness of breath, started on doxycycline and steroids which helped minimally. Today he was somewhat lethargic at home and his daughter checked his oxygen saturation which was 85% on his home 2 L oxygen requirement. He has had productive cough and chills but no fevers. He is complaining of chest pain. No vomiting or diarrhea. Dysuria.   Past Medical History  Diagnosis Date  . Asthma   . Arthritis   . Blood in stool   . Emphysema of lung (HCC)   . Hypertension   . Hyperlipidemia   . Heart disease   . Thyroid disease   . Ischemic cardiomyopathy   . CHF (congestive heart failure) (HCC)   . LBBB (left bundle branch block)   . COPD (chronic obstructive pulmonary disease) (HCC)   . Sleep apnea   . MI (myocardial infarction) [facility]) 1995    Patient Active Problem List   Diagnosis Date Noted  . COPD exacerbation (HCC) 12/17/2014  . Dyspnea 11/21/2014  . Back pain 10/23/2014  . Preventative health care 10/21/2014  . HLD (hyperlipidemia) 10/21/2014  . HTN (hypertension) 10/21/2014  . CKD (chronic kidney disease) stage 3, GFR 30-59 ml/min 10/21/2014  . GERD (gastroesophageal reflux disease) 10/21/2014  . Osteoporosis 10/21/2014  . CAD (coronary artery disease) 10/21/2014  . COPD (chronic obstructive  pulmonary disease) (HCC) 10/21/2014  . Rheumatoid arthritis (HCC) 10/21/2014  . Rectal bleeding 10/21/2014  . Hypothyroidism 10/21/2014  . Gout 10/21/2014  . Shortness of breath 10/21/2014  . Blood loss anemia 10/21/2014  . Cardiomyopathy, ischemic 10/21/2014  . Orthostatic hypotension 10/21/2014    Past Surgical History  Procedure Laterality Date  . Coronary artery bypass graft  1995    5 vessels  . Pacemaker insertion  2002  . Reverse shoulder arthroplasty Right 2013  . Reverse total shoulder arthroplasty Left 2014  . Total knee arthroplasty  2009    both knees  . Cardiac catheterization    . Insert / replace / remove pacemaker      Research officer, political party     Current Outpatient Rx  Name  Route  Sig  Dispense  Refill  . acetaminophen (TYLENOL) 500 MG tablet   Oral   Take 500 mg by mouth every 6 (six) hours as needed.         Marland Kitchen albuterol (PROVENTIL HFA;VENTOLIN HFA) 108 (90 Base) MCG/ACT inhaler   Inhalation   Inhale 2 puffs into the lungs every 4 (four) hours as needed for wheezing or shortness of breath.   1 Inhaler   6   . albuterol (PROVENTIL) (2.5 MG/3ML) 0.083% nebulizer solution   Nebulization   Take 3 mLs (2.5 mg total) by nebulization every 4 (four) hours. For wheezing/SOB   120 mL   12   . alendronate (FOSAMAX) 70 MG tablet   Oral   Take 500 mg by mouth.          Marland Kitchen  allopurinol (ZYLOPRIM) 100 MG tablet   Oral   Take 100 mg by mouth daily.          . carvedilol (COREG) 25 MG tablet   Oral   Take 25 mg by mouth 2 (two) times daily with a meal.          . doxycycline (VIBRA-TABS) 100 MG tablet   Oral   Take 1 tablet (100 mg total) by mouth 2 (two) times daily. X 5 days   10 tablet   0   . eplerenone (INSPRA) 50 MG tablet   Oral   Take 50 mg by mouth daily.          . ferrous sulfate 325 (65 FE) MG tablet   Oral   Take 1 tablet (325 mg total) by mouth 3 (three) times daily.   90 tablet   2   . folic acid (FOLVITE) 1 MG tablet    Oral   Take 1 mg by mouth daily.          . furosemide (LASIX) 80 MG tablet   Oral   Take 80 mg by mouth daily.          . hydroxychloroquine (PLAQUENIL) 200 MG tablet   Oral   Take 200 mg by mouth 2 (two) times daily.          Marland Kitchen leflunomide (ARAVA) 20 MG tablet   Oral   Take 20 mg by mouth daily.          Marland Kitchen levothyroxine (SYNTHROID, LEVOTHROID) 75 MCG tablet   Oral   Take 75 mcg by mouth daily before breakfast.          . lisinopril (PRINIVIL,ZESTRIL) 20 MG tablet   Oral   Take 1 tablet (20 mg total) by mouth daily.   90 tablet   1     Let patient know to take 0.5 of the 40 mg tablet.   . montelukast (SINGULAIR) 10 MG tablet               . Multiple Vitamins-Minerals (MULTIVITAMIN ADULT PO)   Oral   Take by mouth daily.          . nitroGLYCERIN (NITROLINGUAL) 0.4 MG/SPRAY spray   Sublingual   Place 1 spray under the tongue every 5 (five) minutes x 3 doses as needed for chest pain.         Marland Kitchen omeprazole (PRILOSEC) 20 MG capsule   Oral   Take 20 mg by mouth daily.          . predniSONE (DELTASONE) 10 MG tablet      Take 6 tabs x 2 days, 5 tabs x 2 days, 4 tabs x 2 days, 3 tabs x 2 days, 2 tabs x 2 days, 1 tab x 2 days   42 tablet   0   . predniSONE (DELTASONE) 20 MG tablet   Oral   Take 1 tablet (20 mg total) by mouth daily with breakfast. Patient not taking: Reported on 03/27/2015   16 tablet   0   . simvastatin (ZOCOR) 40 MG tablet   Oral   Take 40 mg by mouth daily.          . traMADol (ULTRAM) 50 MG tablet   Oral   Take 1 tablet (50 mg total) by mouth every 8 (eight) hours as needed.   60 tablet   1   . Umeclidinium-Vilanterol (ANORO ELLIPTA) 62.5-25 MCG/INH AEPB   Inhalation  Inhale 1 puff into the lungs daily.   180 each   3     Allergies Review of patient's allergies indicates no known allergies.  Family History  Problem Relation Age of Onset  . Hyperlipidemia Father   . Stroke Father   . Hypertension Father    . Kidney disease Father     Social History Social History  Substance Use Topics  . Smoking status: Former Smoker    Quit date: 02/15/1993  . Smokeless tobacco: Never Used  . Alcohol Use: 0.0 - 0.6 oz/week    0-1 Standard drinks or equivalent per week    Review of Systems Constitutional: No fever, +chills Eyes: No visual changes. ENT: No sore throat. Cardiovascular: +chest pain. Respiratory: + shortness of breath. Gastrointestinal: No abdominal pain.  No nausea, no vomiting.  No diarrhea.  No constipation. Genitourinary: Negative for dysuria. Musculoskeletal: Negative for back pain. Skin: Negative for rash. Neurological: Negative for headaches, focal weakness or numbness.  10-point ROS otherwise negative.  ____________________________________________   PHYSICAL EXAM:  VITAL SIGNS: ED Triage Vitals  Enc Vitals Group     BP 04/13/15 0702 133/82 mmHg     Pulse Rate 04/13/15 0702 82     Resp 04/13/15 0702 24     Temp 04/13/15 0702 97.9 F (36.6 C)     Temp Source 04/13/15 0702 Oral     SpO2 04/13/15 0702 88 %     Weight 04/13/15 0702 165 lb (74.844 kg)     Height 04/13/15 0702 5\' 8"  (1.727 m)     Head Cir --      Peak Flow --      Pain Score 04/13/15 0704 8     Pain Loc --      Pain Edu? --      Excl. in GC? --     Constitutional: Alert and oriented. Chronically ill-appearing with mildly increased work of breathing. Eyes: Conjunctivae are normal. PERRL. EOMI. Head: Atraumatic. Nose: No congestion/rhinnorhea. Mouth/Throat: Mucous membranes are moist.  Oropharynx non-erythematous. Neck: No stridor. Supple without meningismus. Cardiovascular: Normal rate, regular rhythm. Grossly normal heart sounds.  Good peripheral circulation. Respiratory: Mildly increased work of breathing, tachypnea, diffuse expiratory wheeze with moderate air movement.Prolonged expiratory phase. Gastrointestinal: Soft and nontender. No distention.  No CVA tenderness. Genitourinary:  deferred Musculoskeletal: 1+ pitting edema bilateral lower extremity. No joint effusions. Neurologic:  Normal speech and language. No gross focal neurologic deficits are appreciated. No gait instability. Skin:  Skin is warm, dry and intact. No rash noted. Psychiatric: Mood and affect are normal. Speech and behavior are normal.  ____________________________________________   LABS (all labs ordered are listed, but only abnormal results are displayed)  Labs Reviewed  CBC WITH DIFFERENTIAL/PLATELET - Abnormal; Notable for the following:    RBC 3.60 (*)    Hemoglobin 11.1 (*)    HCT 33.4 (*)    RDW 16.8 (*)    Platelets 111 (*)    Lymphs Abs 0.5 (*)    All other components within normal limits  COMPREHENSIVE METABOLIC PANEL - Abnormal; Notable for the following:    Chloride 100 (*)    Glucose, Bld 121 (*)    BUN 25 (*)    Calcium 8.1 (*)    Total Protein 5.8 (*)    All other components within normal limits  TROPONIN I - Abnormal; Notable for the following:    Troponin I 0.07 (*)    All other components within normal limits  CULTURE, BLOOD (ROUTINE  X 2)  CULTURE, BLOOD (ROUTINE X 2)  BRAIN NATRIURETIC PEPTIDE   ____________________________________________  EKG  ED ECG REPORT I, Gayla Doss, the attending physician, personally viewed and interpreted this ECG.   Date: 04/13/2015  EKG Time: 07:02  Rate: 80  Rhythm: Ventricular paced rhythm   ____________________________________________  RADIOLOGY  CXR  IMPRESSION: No acute abnormality noted.  ____________________________________________   PROCEDURES  Procedure(s) performed: None  Critical Care performed: No  ____________________________________________   INITIAL IMPRESSION / ASSESSMENT AND PLAN / ED COURSE  Pertinent labs & imaging results that were available during my care of the patient were reviewed by me and considered in my medical decision making (see chart for details).  Tom Nguyen is a 82  y.o. male with history of end-stage COPD with 2 L intermittent home oxygen requirement, coronary artery disease as post CABG, ischemic myopathy with EF of 28% status post pacemaker who presents for evaluation of worsening shortness of breath over the past 1-2 weeks. Exam is consistent with acute COPD exacerbation with slightly increasing oxygen requirement, currently O2 sat is 100% on 3 L. Patient with mild improvement of his wheezing with improvement of his aeration after 3 DuoNeb treatments as well as Solu-Medrol. We'll give Levaquin. EKG shows paced rhythm. Troponin is mildly elevated at 0.07. Concern for NSTEMI versus demand ischemia. Labs otherwise notable for mild anemia. We'll give aspirin. Case discussed with hospitalist, Dr. Juliene Pina, for admission 9:25 AM. ____________________________________________   FINAL CLINICAL IMPRESSION(S) / ED DIAGNOSES  Final diagnoses:  Chronic obstructive pulmonary disease with acute exacerbation (HCC)  NSTEMI (non-ST elevated myocardial infarction) (HCC)      Gayla Doss, MD 04/13/15 [phone]

## 2015-04-13 NOTE — Progress Notes (Signed)
Pt admitted to room 260 via stretcher. Wife and daughter with the pt.  Skin assessment done and recorded.  Pt displays SOB.  O2 at 95% with 2l [place].  Family states pt sleeps with bipap at night.  Pt is alert and oriented x4.  HOB elevated for pt comfort.  Pt alert and oriented x4, no complaints of pain or discomfort.  Bed in low position, call bell within reach.  Tele applied and verified with the monitor tech.  Bed  alarms on and functioning.  Assessment done and charted.  Will continue to monitor and do hourly rounding throughout the shift

## 2015-04-14 ENCOUNTER — Ambulatory Visit: Payer: Medicare Other | Admitting: Pulmonary Disease

## 2015-04-14 ENCOUNTER — Inpatient Hospital Stay: Payer: Medicare Other

## 2015-04-14 LAB — BASIC METABOLIC PANEL
ANION GAP: 8 (ref 5–15)
BUN: 26 mg/dL — ABNORMAL HIGH (ref 6–20)
CALCIUM: 7.9 mg/dL — AB (ref 8.9–10.3)
CO2: 30 mmol/L (ref 22–32)
Chloride: 99 mmol/L — ABNORMAL LOW (ref 101–111)
Creatinine, Ser: 1.21 mg/dL (ref 0.61–1.24)
GFR, EST NON AFRICAN AMERICAN: 55 mL/min — AB (ref >60)
Glucose, Bld: 145 mg/dL — ABNORMAL HIGH (ref 65–99)
Potassium: 3.9 mmol/L (ref 3.5–5.1)
Sodium: 137 mmol/L (ref 135–145)

## 2015-04-14 LAB — CBC
HCT: 32.4 % — ABNORMAL LOW (ref 40.0–52.0)
HEMOGLOBIN: 10.7 g/dL — AB (ref 13.0–18.0)
MCH: 30.5 pg (ref 26.0–34.0)
MCHC: 33 g/dL (ref 32.0–36.0)
MCV: 92.4 fL (ref 80.0–100.0)
Platelets: 88 10*3/uL — ABNORMAL LOW (ref 150–440)
RBC: 3.51 MIL/uL — AB (ref 4.40–5.90)
RDW: 17.4 % — ABNORMAL HIGH (ref 11.5–14.5)
WBC: 4.3 10*3/uL (ref 3.8–10.6)

## 2015-04-14 LAB — TROPONIN I: TROPONIN I: 0.03 ng/mL (ref <0.031)

## 2015-04-14 MED ORDER — MENTHOL 3 MG MT LOZG
1.0000 | LOZENGE | OROMUCOSAL | Status: DC | PRN
  Filled 2015-04-14: qty 9

## 2015-04-14 MED ORDER — METHYLPREDNISOLONE SODIUM SUCC 40 MG IJ SOLR
40.0000 mg | Freq: Three times a day (TID) | INTRAMUSCULAR | Status: DC
  Administered 2015-04-14 – 2015-04-15 (×3): 40 mg via INTRAVENOUS
  Filled 2015-04-14 (×3): qty 1

## 2015-04-14 MED ORDER — HYDROCOD POLST-CPM POLST ER 10-8 MG/5ML PO SUER
5.0000 mL | Freq: Two times a day (BID) | ORAL | Status: DC
  Administered 2015-04-14 – 2015-04-16 (×4): 5 mL via ORAL
  Filled 2015-04-14 (×5): qty 5

## 2015-04-14 MED ORDER — LEVOFLOXACIN IN D5W 750 MG/150ML IV SOLN
750.0000 mg | INTRAVENOUS | Status: DC
  Administered 2015-04-15: 750 mg via INTRAVENOUS
  Filled 2015-04-14: qty 150

## 2015-04-14 NOTE — Progress Notes (Signed)
Patient resting in bed at this time, culture send to lab to r/o strep, will continue to monitor

## 2015-04-14 NOTE — Progress Notes (Signed)
Patient remains sitting in the chair at thjis, no respiratory distress  noted

## 2015-04-14 NOTE — Progress Notes (Signed)
Pharmacy Antibiotic Note  Tom Nguyen is a 82 y.o. male admitted on 04/13/2015 with pneumonia.  Pharmacy has been consulted for levofloxacin dosing.  Plan: The dose of levofloxacin will be adjusted to 750 mg IV q48h based on renal function.  Height: 5\' 8"  (172.7 cm) Weight: 164 lb 11.2 oz (74.707 kg) (admission) IBW/kg (Calculated) : 68.4  Temp (24hrs), Avg:97.4 F (36.3 C), Min:97.3 F (36.3 C), Max:97.6 F (36.4 C)   Recent Labs Lab 04/13/15 0747 04/13/15 1546 04/14/15 0328  WBC 7.2 4.3 4.3  CREATININE 1.02 1.09 1.21    Estimated Creatinine Clearance: 47.1 mL/min (by C-G formula based on Cr of 1.21).    No Known Allergies  Antimicrobials this admission: Anti-infectives    Start     Dose/Rate Route Frequency Ordered Stop   04/15/15 1000  levofloxacin (LEVAQUIN) IVPB 750 mg     750 mg 100 mL/hr over 90 Minutes Intravenous Every 48 hours 04/14/15 0733     04/14/15 1000  levofloxacin (LEVAQUIN) IVPB 750 mg  Status:  Discontinued     750 mg 100 mL/hr over 90 Minutes Intravenous Every 24 hours 04/13/15 1518 04/14/15 0733   04/13/15 1530  hydroxychloroquine (PLAQUENIL) tablet 200 mg     200 mg Oral 2 times daily 04/13/15 1518     04/13/15 0915  levofloxacin (LEVAQUIN) IVPB 750 mg     750 mg 100 mL/hr over 90 Minutes Intravenous  Once 04/13/15 0912 04/13/15 1128      Dose adjustments this admission: Renally adjust levofloxacin to 750 mg IV q48h based on CrCl~47 mL/min  Microbiology results: No results found for this or any previous visit.  Thank you for allowing pharmacy to be a part of this patient's care.  Issa Kosmicki G 04/14/2015 7:35 AM

## 2015-04-14 NOTE — Progress Notes (Signed)
[facility] - [place] at [facility]   PATIENT NAME: Tom Nguyen    MR#:  [id number]  DATE OF BIRTH:  February 14, 1932  SUBJECTIVE:   Patient has sore throat  Improved shortness of breath. Still has cough. REVIEW OF SYSTEMS:    Review of Systems  Constitutional: Negative for fever, chills and malaise/fatigue.  HENT: Positive for sore throat. Negative for ear discharge, ear pain, hearing loss and nosebleeds.   Eyes: Negative for blurred vision and pain.  Respiratory: Positive for cough, sputum production, shortness of breath and wheezing. Negative for hemoptysis.   Cardiovascular: Negative for chest pain, palpitations and leg swelling.  Gastrointestinal: Negative for nausea, vomiting, abdominal pain, diarrhea and blood in stool.  Genitourinary: Negative for dysuria.  Musculoskeletal: Negative for back pain.  Neurological: Negative for dizziness, tremors, speech change, focal weakness, seizures, weakness and headaches.  Endo/Heme/Allergies: Does not bruise/bleed easily.  Psychiatric/Behavioral: Negative for depression, suicidal ideas and hallucinations.    Tolerating Diet: Yes      DRUG ALLERGIES:  No Known Allergies  VITALS:  Blood pressure 130/94, pulse 78, temperature 97.3 F (36.3 C), temperature source Oral, resp. rate 20, height 5\' 8"  (1.727 m), weight 74.707 kg (164 lb 11.2 oz), SpO2 94 %.  PHYSICAL EXAMINATION:   Physical Exam  Constitutional: He is oriented to person, place, and time and well-developed, well-nourished, and in no distress. No distress.  HENT:  Head: Normocephalic.  Eyes: No scleral icterus.  Neck: Normal range of motion. Neck supple. No JVD present. No tracheal deviation present.  Cardiovascular: Normal rate, regular rhythm and normal heart sounds.  Exam reveals no gallop and no friction rub.   No murmur heard. Pulmonary/Chest: Effort normal. No respiratory distress. He has wheezes. He has no rales. He exhibits no tenderness.   Abdominal: Soft. Bowel sounds are normal. He exhibits no distension and no mass. There is no tenderness. There is no rebound and no guarding.  Musculoskeletal: Normal range of motion. He exhibits no edema.  Lymphadenopathy:    He has cervical adenopathy (left).  Neurological: He is alert and oriented to person, place, and time.  Skin: Skin is warm. No rash noted. No erythema.  Psychiatric: Affect and judgment normal.      LABORATORY PANEL:   CBC  Recent Labs Lab 04/14/15 0328  WBC 4.3  HGB 10.7*  HCT 32.4*  PLT 88*   ------------------------------------------------------------------------------------------------------------------  Chemistries   Recent Labs Lab 04/13/15 0747  04/14/15 0328  NA 137  --  137  K 4.0  --  3.9  CL 100*  --  99*  CO2 27  --  30  GLUCOSE 121*  --  145*  BUN 25*  --  26*  CREATININE 1.02  < > 1.21  CALCIUM 8.1*  --  7.9*  AST 26  --   --   ALT 26  --   --   ALKPHOS 38  --   --   BILITOT 0.8  --   --   < > = values in this interval not displayed. ------------------------------------------------------------------------------------------------------------------  Cardiac Enzymes  Recent Labs Lab 04/13/15 1546 04/13/15 2105 04/14/15 0328  TROPONINI 0.05* 0.03 0.03   ------------------------------------------------------------------------------------------------------------------  RADIOLOGY:  Dg Chest 2 View  04/13/2015  CLINICAL DATA:  Shortness of breath for few days EXAM: CHEST  2 VIEW COMPARISON:  12/16/2014 FINDINGS: Cardiac shadow is mildly enlarged but stable. A defibrillator is again seen in stable position. The lungs are well aerated bilaterally. Mild thickening of  the minor fissure on the right is seen. No focal infiltrate or sizable effusion is noted. IMPRESSION: No acute abnormality noted. Electronically Signed   By: Alcide Clever M.D.   On: 04/13/2015 08:06     ASSESSMENT AND PLAN:   82 year old male with a history of  COPD and ischemic cardiomyopathy EF of 45% to presents with acute hypoxic respiratory failure.  1. Acute hypoxic respiratory failure: This is a combination of CHF exacerbation and COPD exacerbation.  He is showing some improvement  2. Acute on chronic systolic heart failure EF 45%: BNP greater than 4500 on admission  Diuresing well with IV Lasix. Continue to monitor input and output . Currently only -200  Continue Coreg and lisinopril.  3. Acute COPD exacerbation: Wean  IV steroids. Continue  nebulizer and outpatient inhaler. Continue Levaquin, no evidence of pneumonia on initial chest x-ray  Repeat chest x-ray today. Continue oxygen therapy  4. Hypothyroidism: Continue Synthroid.  5. Hyperlipidemia: Continue Zocor. 6. Elevated troponin: this is due to demand ischemia and not ACS. Troponins are flat. 7. OSA: Continue  CPAP at night with  2 L O2.   8. Low platelets: Stop Lovenox and follow platelet count.   Management plans discussed with the patient and he is in agreement.  CODE STATUS: DNR  TOTAL TIME TAKING CARE OF THIS PATIENT: 33 minutes.     POSSIBLE D/C 1-2 days, DEPENDING ON CLINICAL CONDITION.   Karl Erway M.D on 04/14/2015 at 10:11 AM  Between 7am to 6pm - Pager - [phone] After 6pm go to [email or web address] - password [email or web address] [facility]  [place] [facility]  Office  [phone]  CC: Primary care physician; [name], DO  Note: This dictation was prepared with Dragon dictation along with smaller phrase technology. Any transcriptional errors that result from this process are unintentional.

## 2015-04-14 NOTE — Progress Notes (Signed)
Patient refuses his iron tablet, education provided. Patient also refuses the bed alarm, education provided will continue to monitor.

## 2015-04-14 NOTE — Evaluation (Signed)
Physical Therapy Evaluation Patient Details Name: Tom Nguyen MRN: [id number] DOB: [birth year] Today's Date: 04/14/2015   History of Present Illness  82 yo M presented to ED for increased difficulty breating and low oxygen saturation. He was found to have respiratory failure and COPD exacerbation. PMH includes HTN, CHF, LBBB, COPD, MI, CABG x5 and pacemaker.  Clinical Impression  Pt demonstrated generalized weakness and difficulty walking. He performed bed mobility with min guard with use of rail. Transfers and ambulation of 120 ft with FWW requires min guard and cues for safety. Pt becomes SOB with ambulation but SpO2 remained at 98% on 3L. He demonstrated generally steady gait with no LOB, with cues given for increased BOS to improve stability. Pt required therapeutic rest breaks for energy conservation, requiring increased time to complete tasks. HHPT is recommended to progress towards PLOF and assess home safety. PT recommended PT use FWW at this time for safety. Pt will benefit from skilled PT services to increase functional I and mobility for safe discharge.     Follow Up Recommendations Home health PT;Supervision - Intermittent    Equipment Recommendations  None recommended by PT (pt has recommended FWW)    Recommendations for Other Services       Precautions / Restrictions Precautions Precautions: Fall;ICD/Pacemaker Restrictions Weight Bearing Restrictions: No      Mobility  Bed Mobility Overal bed mobility: Needs Assistance Bed Mobility: Supine to Sit     Supine to sit: Min guard;HOB elevated     General bed mobility comments: uses rail  Transfers Overall transfer level: Needs assistance Equipment used: Rolling walker (2 wheeled) Transfers: Sit to/from [employer] Sit to Stand: Min guard Stand pivot transfers: Min guard       General transfer comment: pt steady with no LOB; cues for hand placement and safety  Ambulation/Gait Ambulation/Gait  assistance: Min guard Ambulation Distance (Feet): 120 Feet Assistive device: Rolling walker (2 wheeled) Gait Pattern/deviations: Decreased stride length;Narrow base of support Gait velocity: reduced   General Gait Details: Demonstrated steady gait with no LOB. Cues given to increase BOS for improved stability.   Stairs            Wheelchair Mobility    Modified Rankin (Stroke Patients Only)       Balance Overall balance assessment: Needs assistance Sitting-balance support: Feet supported Sitting balance-Leahy Scale: Good     Standing balance support: Bilateral upper extremity supported Standing balance-Leahy Scale: Fair Standing balance comment: maintains balance with UE support                             Pertinent Vitals/Pain Pain Assessment: No/denies pain    Home Living Family/patient expects to be discharged to:: Private residence Living Arrangements: Spouse/significant other;Children Available Help at Discharge: Family Type of Home: House Home Access: Stairs to enter Entrance Stairs-Rails: None Entrance Stairs-Number of Steps: 2 Home Layout: Two level;Able to live on main level with bedroom/bathroom (pt and wife live on first floor, daughter and husband on 2nd) Home Equipment: Walker - 2 wheels;Walker - 4 wheels;Cane - single point      Prior Function Level of Independence: Independent with assistive device(s)         Comments: Pt reports most recently using a SPC for ambulation, I with ADLs and driving prior to hospitalization. He was recently placed on home oxygen of 2L.     Hand Dominance        Extremity/Trunk Assessment  Upper Extremity Assessment: Generalized weakness           Lower Extremity Assessment: Generalized weakness (grossly 4/5)         Communication   Communication: No difficulties  Cognition Arousal/Alertness: Awake/alert Behavior During Therapy: WFL for tasks assessed/performed Overall Cognitive  Status: Within Functional Limits for tasks assessed                      General Comments      Exercises Other Exercises Other Exercises: B LE therex: ankle pumps, heel slides, QS, hip abd slides; seated: LAQs x10 each. Cues for proper technique. Monitored vitals frequently. Therapeutic rest breaks for energy conservation.      Assessment/Plan    PT Assessment Patient needs continued PT services  PT Diagnosis Difficulty walking;Generalized weakness   PT Problem List Decreased strength;Decreased activity tolerance;Decreased balance;Decreased mobility;Decreased knowledge of use of DME;Decreased safety awareness;Cardiopulmonary status limiting activity  PT Treatment Interventions DME instruction;Gait training;Stair training;Therapeutic activities;Therapeutic exercise;Balance training;Patient/family education   PT Goals (Current goals can be found in the Care Plan section) Acute Rehab PT Goals Patient Stated Goal: to get better PT Goal Formulation: With patient Time For Goal Achievement: 04/28/15 Potential to Achieve Goals: Good    Frequency Min 2X/week   Barriers to discharge Inaccessible home environment;Decreased caregiver support steps to enter; wife isn't in good health; daughter works    Co-evaluation               End of Session Equipment Utilized During Treatment: Gait belt;Oxygen Activity Tolerance: Patient tolerated treatment well Patient left: in chair;with call bell/phone within reach;with chair alarm set;with nursing/sitter in room Nurse Communication: Mobility status         Time: 4709-2957 PT Time Calculation (min) (ACUTE ONLY): 26 min   Charges:   PT Evaluation $PT Eval Moderate Complexity: 1 Procedure PT Treatments $Therapeutic Exercise: 8-22 mins   PT G Codes:        Adelene Idler, PT, DPT  04/14/2015, 1:05 PM [phone]

## 2015-04-15 MED ORDER — METHYLPREDNISOLONE SODIUM SUCC 40 MG IJ SOLR
40.0000 mg | Freq: Every day | INTRAMUSCULAR | Status: DC
  Administered 2015-04-16: 40 mg via INTRAVENOUS
  Filled 2015-04-15: qty 1

## 2015-04-15 MED ORDER — AZITHROMYCIN 250 MG PO TABS: 250.0000 mg | ORAL_TABLET | Freq: Every day | ORAL | Status: DC

## 2015-04-15 MED ORDER — TRAMADOL HCL 50 MG PO TABS
50.0000 mg | ORAL_TABLET | Freq: Two times a day (BID) | ORAL | Status: DC
  Administered 2015-04-15 – 2015-04-16 (×3): 50 mg via ORAL
  Filled 2015-04-15 (×3): qty 1

## 2015-04-15 MED ORDER — FUROSEMIDE 20 MG PO TABS
20.0000 mg | ORAL_TABLET | Freq: Two times a day (BID) | ORAL | Status: DC
  Administered 2015-04-15 – 2015-04-16 (×2): 20 mg via ORAL
  Filled 2015-04-15 (×2): qty 1

## 2015-04-15 MED ORDER — LEVOFLOXACIN 750 MG PO TABS: 750.0000 mg | ORAL_TABLET | ORAL | Status: DC

## 2015-04-15 MED ORDER — BENZONATATE 100 MG PO CAPS
200.0000 mg | ORAL_CAPSULE | Freq: Three times a day (TID) | ORAL | Status: DC
  Administered 2015-04-15 – 2015-04-16 (×3): 200 mg via ORAL
  Filled 2015-04-15 (×3): qty 2

## 2015-04-15 MED ORDER — AZITHROMYCIN 250 MG PO TABS: 500.0000 mg | ORAL_TABLET | Freq: Every day | ORAL | Status: DC

## 2015-04-15 MED ORDER — CARVEDILOL 12.5 MG PO TABS
12.5000 mg | ORAL_TABLET | Freq: Two times a day (BID) | ORAL | Status: DC
  Administered 2015-04-15 – 2015-04-16 (×2): 12.5 mg via ORAL
  Filled 2015-04-15 (×2): qty 1

## 2015-04-15 NOTE — Progress Notes (Signed)
Patient up to chair with assistance refused bed alarm and chair alarm patient will call for help.

## 2015-04-15 NOTE — Progress Notes (Addendum)
[facility] Physicians -  at [facility]   PATIENT NAME: Tom Nguyen    MR#:  [id number]  DATE OF BIRTH:  1932/11/01  SUBJECTIVE:   Improved breathing, on 3-4L o2 now- chronically on 2l o2 - still complains of cough.  REVIEW OF SYSTEMS:    Review of Systems  Constitutional: Negative for fever, chills and malaise/fatigue.  HENT: Positive for sore throat. Negative for ear discharge, ear pain, hearing loss and nosebleeds.   Eyes: Negative for blurred vision and pain.  Respiratory: Positive for cough, sputum production, shortness of breath and wheezing. Negative for hemoptysis.   Cardiovascular: Negative for chest pain, palpitations and leg swelling.  Gastrointestinal: Negative for nausea, vomiting, abdominal pain, diarrhea and blood in stool.  Genitourinary: Negative for dysuria.  Musculoskeletal: Negative for back pain.  Neurological: Negative for dizziness, tremors, speech change, focal weakness, seizures, weakness and headaches.  Endo/Heme/Allergies: Does not bruise/bleed easily.  Psychiatric/Behavioral: Negative for depression, suicidal ideas and hallucinations.    Tolerating Diet: Yes   DRUG ALLERGIES:  No Known Allergies  VITALS:  Blood pressure 98/53, pulse 72, temperature 97.7 F (36.5 C), temperature source Oral, resp. rate 18, height 5\' 8"  (1.727 m), weight 74.707 kg (164 lb 11.2 oz), SpO2 98 %.  PHYSICAL EXAMINATION:   Physical Exam  Constitutional: He is oriented to person, place, and time and well-developed, well-nourished, and in no distress. No distress.  HENT:  Head: Normocephalic.  Eyes: No scleral icterus.  Neck: Normal range of motion. Neck supple. No JVD present. No tracheal deviation present.  Cardiovascular: Normal rate, regular rhythm and normal heart sounds.  Exam reveals no gallop and no friction rub.   No murmur heard. Pulmonary/Chest: Effort normal. No respiratory distress. He has wheezes. He has no rales. He exhibits no  tenderness.  Abdominal: Soft. Bowel sounds are normal. He exhibits no distension and no mass. There is no tenderness. There is no rebound and no guarding.  Musculoskeletal: Normal range of motion. He exhibits no edema.  Lymphadenopathy:    He has cervical adenopathy (left).  Neurological: He is alert and oriented to person, place, and time.  Skin: Skin is warm. No rash noted. No erythema.  Psychiatric: Affect and judgment normal.      LABORATORY PANEL:   CBC  Recent Labs Lab 04/14/15 0328  WBC 4.3  HGB 10.7*  HCT 32.4*  PLT 88*   ------------------------------------------------------------------------------------------------------------------  Chemistries   Recent Labs Lab 04/13/15 0747  04/14/15 0328  NA 137  --  137  K 4.0  --  3.9  CL 100*  --  99*  CO2 27  --  30  GLUCOSE 121*  --  145*  BUN 25*  --  26*  CREATININE 1.02  < > 1.21  CALCIUM 8.1*  --  7.9*  AST 26  --   --   ALT 26  --   --   ALKPHOS 38  --   --   BILITOT 0.8  --   --   < > = values in this interval not displayed. ------------------------------------------------------------------------------------------------------------------  Cardiac Enzymes  Recent Labs Lab 04/13/15 1546 04/13/15 2105 04/14/15 0328  TROPONINI 0.05* 0.03 0.03   ------------------------------------------------------------------------------------------------------------------  RADIOLOGY:  Dg Chest 1 View  04/14/2015  CLINICAL DATA:  CHF. EXAM: CHEST 1 VIEW COMPARISON:  04/13/2015 FINDINGS: Left-sided pacemaker unchanged. Sternotomy wires unchanged. Lungs are adequately inflated without focal consolidation. Stable mild blunting of the right costophrenic angle. There is stable cardiomegaly. Calcified plaque over the  aortic arch. Bilateral shoulder prostheses unchanged. Remainder of the exam unchanged. IMPRESSION: Stable minimal blunting of the right costophrenic angle suggesting a small amount of pleural fluid. Stable  cardiomegaly. Electronically Signed   By: Elberta Fortis M.D.   On: 04/14/2015 11:13     ASSESSMENT AND PLAN:   82 year old male with a history of COPD and ischemic cardiomyopathy EF of 45% to presents with acute hypoxic respiratory failure.  1. Acute hypoxic respiratory failure: This is a combination of CHF exacerbation and COPD exacerbation.  -Improving slowly. Repeat chest x-ray with minimal right pleural effusion but no pulmonary edema or pneumonia  2. Acute on chronic systolic heart failure EF 45%:   Diuresing well with IV Lasix. Change to oral today  Continue to monitor input and output .  Continue Coreg and lisinopril.  3. Acute COPD exacerbation: Much improved wheezing. Chronically on 2 L home oxygen. Currently on 3-4 L. Wean as tolerated. Continue  nebulizer and outpatient inhaler. Continue Levaquin, likely has underlying bronchitis. -Cough medications ordered.  -Decrease steroids dose today. Continue Singulair. Continue oxygen therapy -Cough meds added  4. Hypothyroidism: Continue Synthroid.  5. Hyperlipidemia: Continue Zocor.  6. Elevated troponin: this is due to demand ischemia and not ACS. Troponins are flat.  7. OSA: Continue  CPAP at night with  2 L O2.   8. Thrombocytopenia-appears low but stable. Lovenox has been held  Physical therapy consult and possible discharge tomorrow if stable.   Management plans discussed with the patient and he is in agreement.  CODE STATUS: DNR  TOTAL TIME TAKING CARE OF THIS PATIENT: 33 minutes.     POSSIBLE D/C 1-2 days, DEPENDING ON CLINICAL CONDITION.   Enid Baas M.D on 04/15/2015 at 2:53 PM  Between 7am to 6pm - Pager - [phone]  After 6pm go to [email or web address] - password [email or web address] [facility]  [facility]  Office  [phone]  CC: Primary care physician; [name], DO  Note: This dictation was prepared with Dragon dictation along with smaller phrase technology. Any transcriptional errors  that result from this process are unintentional.

## 2015-04-15 NOTE — Progress Notes (Signed)
PHARMACIST - PHYSICIAN COMMUNICATION DR:   Kalisetti CONCERNING: Antibiotic IV to Oral Route Change Policy  RECOMMENDATION: This patient is receiving levofloxacin by the intravenous route.  Based on criteria approved by the Pharmacy and Therapeutics Committee, the antibiotic(s) is/are being converted to the equivalent oral dose form(s).   DESCRIPTION: These criteria include:  Patient being treated for a respiratory tract infection, urinary tract infection, cellulitis or clostridium difficile associated diarrhea if on metronidazole  The patient is not neutropenic and does not exhibit a GI malabsorption state  The patient is eating (either orally or via tube) and/or has been taking other orally administered medications for a least 24 hours  The patient is improving clinically and has a Tmax < 100.5  If you have questions about this conversion, please contact the Pharmacy Department  []  ( [phone] )  [place] [x]  ( [phone] )   [facility] []  ( [phone] )  [place] []  ( [phone] )  [facility] []  ( [phone] )  [facility]   

## 2015-04-15 NOTE — Care Management (Signed)
Patient lives with his wife, their daughter and husband.  Prior to this episode of illness, patient was able to ambulate but slowly and perform his personal care.  He has chronic home 02 through [place].  He is agreeable to home health services  (SN and PT)  and does not have agency preference.  He may benefit from occupational therapy for energy conservation.  Referral for nursing and physical therapy called to [facility] and referral accepted.  Patient is current with PCP and confirmed address and phone numbers.

## 2015-04-16 MED ORDER — POTASSIUM CHLORIDE ER 20 MEQ PO TBCR: 20.0000 meq | EXTENDED_RELEASE_TABLET | Freq: Every day | ORAL | Status: DC

## 2015-04-16 MED ORDER — TRAMADOL HCL 50 MG PO TABS: 50.0000 mg | ORAL_TABLET | Freq: Two times a day (BID) | ORAL | Status: DC

## 2015-04-16 MED ORDER — CARVEDILOL 12.5 MG PO TABS: 12.5000 mg | ORAL_TABLET | Freq: Two times a day (BID) | ORAL | Status: DC

## 2015-04-16 MED ORDER — PREDNISONE 10 MG (21) PO TBPK: 10.0000 mg | ORAL_TABLET | Freq: Every day | ORAL | Status: DC

## 2015-04-16 MED ORDER — HYDROCOD POLST-CPM POLST ER 10-8 MG/5ML PO SUER: 5.0000 mL | Freq: Two times a day (BID) | ORAL | Status: DC

## 2015-04-16 MED ORDER — LEVOFLOXACIN 500 MG PO TABS: 500.0000 mg | ORAL_TABLET | Freq: Every day | ORAL | Status: DC

## 2015-04-16 MED ORDER — BENZONATATE 200 MG PO CAPS: 200.0000 mg | ORAL_CAPSULE | Freq: Three times a day (TID) | ORAL | Status: DC

## 2015-04-16 MED ORDER — LISINOPRIL 20 MG PO TABS: 20.0000 mg | ORAL_TABLET | Freq: Every day | ORAL | Status: DC

## 2015-04-16 NOTE — Discharge Summary (Signed)
[facility] - [place] at [facility]   PATIENT NAME: Tom Nguyen    MR#:  [id number]  DATE OF BIRTH:  1932-08-31  DATE OF ADMISSION:  04/13/2015 ADMITTING PHYSICIAN: Adrian Saran, MD  DATE OF DISCHARGE: 04/16/2015  3:10 PM  PRIMARY CARE PHYSICIAN: [name], DO    ADMISSION DIAGNOSIS:  NSTEMI (non-ST elevated myocardial infarction) (HCC) [I21.4] Chronic obstructive pulmonary disease with acute exacerbation (HCC) [J44.1]  DISCHARGE DIAGNOSIS:  Active Problems:   Respiratory failure (HCC)   SECONDARY DIAGNOSIS:   Past Medical History  Diagnosis Date  . Asthma   . Arthritis   . Blood in stool   . Emphysema of lung (HCC)   . Hypertension   . Hyperlipidemia   . Heart disease   . Thyroid disease   . Ischemic cardiomyopathy   . CHF (congestive heart failure) (HCC)   . LBBB (left bundle branch block)   . COPD (chronic obstructive pulmonary disease) (HCC)   . Sleep apnea   . MI (myocardial infarction) [facility]) 1995    HOSPITAL COURSE:   82 year old male with a history of COPD and ischemic cardiomyopathy EF of 45% to presents with acute hypoxic respiratory failure.  1. Acute hypoxic respiratory failure: This is a combination of CHF exacerbation and COPD exacerbation.  -Improving. Repeat chest x-ray with minimal right pleural effusion but no pulmonary edema or pneumonia - back to 2l o2 which is his baseline  2. Acute on chronic systolic heart failure EF 45%:  Diuresed well with IV Lasix. Discharged on oral lasix Low salt diet advised Continue Coreg and lisinopril.  3. Acute COPD exacerbation: Much improved wheezing. Chronically on 2 L home oxygen.  -Continue nebulizer and outpatient inhalers. Continue Levaquin, as has underlying bronchitis. -Cough medications ordered.  -discharge on steroid taper. Continue Singulair. Continue oxygen therapy   4. Hypothyroidism: Continue Synthroid.  5. Hyperlipidemia: Continue Zocor.  6. Elevated  troponin: this is due to demand ischemia and not ACS. Troponins are flat.  7. OSA: Continue CPAP at night with 2 L O2.  8. Thrombocytopenia-appears low but stable.  Physical therapy consulted and home health set up at discharge  DISCHARGE CONDITIONS:   Guarded  CONSULTS OBTAINED:   None  DRUG ALLERGIES:  No Known Allergies  DISCHARGE MEDICATIONS:   Discharge Medication List as of 04/16/2015  2:28 PM    START taking these medications   Details  benzonatate (TESSALON) 200 MG capsule Take 1 capsule (200 mg total) by mouth 3 (three) times daily., Starting 04/16/2015, Until Discontinued, Normal    chlorpheniramine-HYDROcodone (TUSSIONEX) 10-8 MG/5ML SUER Take 5 mLs by mouth every 12 (twelve) hours. X 7 days, Starting 04/16/2015, Until Discontinued, Normal    levofloxacin (LEVAQUIN) 500 MG tablet Take 1 tablet (500 mg total) by mouth daily. X 5 more days, Starting 04/17/2015, Until Discontinued, Normal    potassium chloride 20 MEQ TBCR Take 20 mEq by mouth daily., Starting 04/16/2015, Until Discontinued, Normal    predniSONE (STERAPRED UNI-PAK 21 TAB) 10 MG (21) TBPK tablet Take 1 tablet (10 mg total) by mouth daily. 6 tabs PO x 1 day 5 tabs PO x 1 day 4 tabs PO x 1 day 3 tabs PO x 1 day 2 tabs PO x 1 day 1 tab PO x 1 day and stop, Starting 04/16/2015, Until Discontinued, Normal    traMADol (ULTRAM) 50 MG tablet Take 1 tablet (50 mg total) by mouth 2 (two) times daily. X 7 days, Starting 04/16/2015, Until Discontinued, Print  CONTINUE these medications which have CHANGED   Details  carvedilol (COREG) 12.5 MG tablet Take 1 tablet (12.5 mg total) by mouth 2 (two) times daily with a meal., Starting 04/16/2015, Until Discontinued, Normal    lisinopril (PRINIVIL,ZESTRIL) 20 MG tablet Take 1 tablet (20 mg total) by mouth daily., Starting 04/16/2015, Until Discontinued, Normal      CONTINUE these medications which have NOT CHANGED   Details  acetaminophen (TYLENOL) 500 MG tablet  Take 500 mg by mouth every 6 (six) hours as needed., Until Discontinued, Historical Med    albuterol (PROVENTIL HFA;VENTOLIN HFA) 108 (90 Base) MCG/ACT inhaler Inhale 2 puffs into the lungs every 4 (four) hours as needed for wheezing or shortness of breath., Starting 03/05/2015, Until Discontinued, Normal    albuterol (PROVENTIL) (2.5 MG/3ML) 0.083% nebulizer solution Take 3 mLs (2.5 mg total) by nebulization every 4 (four) hours. For wheezing/SOB, Starting 03/05/2015, Until Discontinued, Print    alendronate (FOSAMAX) 70 MG tablet Take 70 mg by mouth once a week. , Starting 04/08/2015, Until Discontinued, Historical Med    allopurinol (ZYLOPRIM) 100 MG tablet Take 100 mg by mouth daily. , Starting 08/25/2014, Until Discontinued, Historical Med    eplerenone (INSPRA) 50 MG tablet Take 50 mg by mouth daily. , Starting 08/12/2014, Until Discontinued, Historical Med    folic acid (FOLVITE) 1 MG tablet Take 1 mg by mouth daily. , Starting 08/25/2014, Until Discontinued, Historical Med    furosemide (LASIX) 80 MG tablet Take 80 mg by mouth daily. , Starting 09/17/2014, Until Discontinued, Historical Med    hydroxychloroquine (PLAQUENIL) 200 MG tablet Take 200 mg by mouth 2 (two) times daily. , Starting 09/26/2014, Until Discontinued, Historical Med    leflunomide (ARAVA) 20 MG tablet Take 20 mg by mouth daily. , Starting 08/25/2014, Until Discontinued, Historical Med    levothyroxine (SYNTHROID, LEVOTHROID) 75 MCG tablet Take 75 mcg by mouth daily before breakfast. , Starting 09/17/2014, Until Discontinued, Historical Med    montelukast (SINGULAIR) 10 MG tablet Take 10 mg by mouth daily. , Starting 01/25/2015, Until Discontinued, Historical Med    Multiple Vitamins-Minerals (MULTIVITAMIN ADULT PO) Take by mouth daily. , Until Discontinued, Historical Med    nitroGLYCERIN (NITROLINGUAL) 0.4 MG/SPRAY spray Place 1 spray under the tongue every 5 (five) minutes x 3 doses as needed for chest pain., Until  Discontinued, Historical Med    omeprazole (PRILOSEC) 20 MG capsule Take 20 mg by mouth daily. , Starting 09/17/2014, Until Discontinued, Historical Med    simvastatin (ZOCOR) 40 MG tablet Take 40 mg by mouth daily. , Starting 10/02/2014, Until Discontinued, Historical Med    Umeclidinium-Vilanterol (ANORO ELLIPTA) 62.5-25 MCG/INH AEPB Inhale 1 puff into the lungs daily., Starting 12/04/2014, Until Discontinued, Normal      STOP taking these medications     ferrous sulfate 325 (65 FE) MG tablet          DISCHARGE INSTRUCTIONS:   1. PCP f/u in 1-2 weeks 2. Home health  If you experience worsening of your admission symptoms, develop shortness of breath, life threatening emergency, suicidal or homicidal thoughts you must seek medical attention immediately by calling 911 or calling your MD immediately  if symptoms less severe.  You Must read complete instructions/literature along with all the possible adverse reactions/side effects for all the Medicines you take and that have been prescribed to you. Take any new Medicines after you have completely understood and accept all the possible adverse reactions/side effects.   Please note  You were cared  for by a hospitalist during your hospital stay. If you have any questions about your discharge medications or the care you received while you were in the hospital after you are discharged, you can call the unit and asked to speak with the hospitalist on call if the hospitalist that took care of you is not available. Once you are discharged, your primary care physician will handle any further medical issues. Please note that NO REFILLS for any discharge medications will be authorized once you are discharged, as it is imperative that you return to your primary care physician (or establish a relationship with a primary care physician if you do not have one) for your aftercare needs so that they can reassess your need for medications and monitor your lab  values.    Today   CHIEF COMPLAINT:   Chief Complaint  Patient presents with  . Shortness of Breath    VITAL SIGNS:  Blood pressure 93/60, pulse 74, temperature 98.3 F (36.8 C), temperature source Oral, resp. rate 18, height 5\' 8"  (1.727 m), weight 74.707 kg (164 lb 11.2 oz), SpO2 98 %.  I/O:   Intake/Output Summary (Last 24 hours) at 04/16/15 1540 Last data filed at 04/16/15 1309  Gross per 24 hour  Intake    480 ml  Output    575 ml  Net    -95 ml    PHYSICAL EXAMINATION:   Physical Exam  Constitutional: He is oriented to person, place, and time and well-developed, well-nourished, and in no distress. No distress.  HENT:  Head: Normocephalic.  Eyes: No scleral icterus.  Neck: Normal range of motion. Neck supple. No JVD present. No tracheal deviation present.  Cardiovascular: Normal rate, regular rhythm and normal heart sounds. Exam reveals no gallop and no friction rub.  No murmur heard. Pulmonary/Chest: Effort normal. No respiratory distress. He has scattered wheezes. He has no rales. He exhibits no tenderness. Decreased bibasilar breath sounds. Abdominal: Soft. Bowel sounds are normal. He exhibits no distension and no mass. There is no tenderness. There is no rebound and no guarding.  Musculoskeletal: Normal range of motion. He exhibits no edema.   Neurological: He is alert and oriented to person, place, and time.  Skin: Skin is warm. No rash noted. No erythema.  Psychiatric: Affect and judgment normal.   DATA REVIEW:   CBC  Recent Labs Lab 04/14/15 0328  WBC 4.3  HGB 10.7*  HCT 32.4*  PLT 88*    Chemistries   Recent Labs Lab 04/13/15 0747  04/14/15 0328  NA 137  --  137  K 4.0  --  3.9  CL 100*  --  99*  CO2 27  --  30  GLUCOSE 121*  --  145*  BUN 25*  --  26*  CREATININE 1.02  < > 1.21  CALCIUM 8.1*  --  7.9*  AST 26  --   --   ALT 26  --   --   ALKPHOS 38  --   --   BILITOT 0.8  --   --   < > = values in this interval not  displayed.  Cardiac Enzymes  Recent Labs Lab 04/14/15 0328  TROPONINI 0.03    Microbiology Results  Results for orders placed or performed during the hospital encounter of 04/13/15  Blood culture (routine x 2)     Status: None (Preliminary result)   Collection Time: 04/13/15  7:47 AM  Result Value Ref Range Status   Specimen Description BLOOD  L ARM  Final   Special Requests   Final    BOTTLES DRAWN AEROBIC AND ANAEROBIC AER ANA   Culture NO GROWTH 3 DAYS  Final   Report Status PENDING  Incomplete  Blood culture (routine x 2)     Status: None (Preliminary result)   Collection Time: 04/13/15 11:04 AM  Result Value Ref Range Status   Specimen Description BLOOD LEFT WRIST  Final   Special Requests BOTTLES DRAWN AEROBIC AND ANAEROBIC  3CC  Final   Culture NO GROWTH 3 DAYS  Final   Report Status PENDING  Incomplete    RADIOLOGY:  No results found.  EKG:   Orders placed or performed during the hospital encounter of 04/13/15  . EKG 12-Lead  . EKG 12-Lead  . ED EKG  . ED EKG      Management plans discussed with the patient, family and they are in agreement.  CODE STATUS:     Code Status Orders        Start     Ordered   04/13/15 1519  Do not attempt resuscitation (DNR)   Continuous    Question Answer Comment  In the event of cardiac or respiratory ARREST Do not call a "code blue"   In the event of cardiac or respiratory ARREST Do not perform Intubation, CPR, defibrillation or ACLS   In the event of cardiac or respiratory ARREST Use medication by any route, position, wound care, and other measures to relive pain and suffering. May use oxygen, suction and manual treatment of airway obstruction as needed for comfort.      04/13/15 1518    Code Status History    Date Active Date Inactive Code Status Order ID Comments User Context   This patient has a current code status but no historical code status.      TOTAL TIME TAKING CARE OF THIS PATIENT: 37  minutes.    Mikylah Ackroyd M.D on 04/16/2015 at 3:40 PM  Between 7am to 6pm - Pager - [phone]  After 6pm go to [email or web address] - password [email or web address] [facility]  [place]  Hospitalists  Office  [phone]  CC: Primary care physician; [name], DO

## 2015-04-16 NOTE — Care Management Important Message (Signed)
Important Message  Patient Details  Name: Carthel Castille MRN: [id number] Date of Birth: Nov 02, 1932   Medicare Important Message Given:  Yes    Chapman Fitch, RN 04/16/2015, 11:37 AM

## 2015-04-16 NOTE — Care Management (Signed)
Notified [facility] of discharge and faxed orders and discharge summary.  Instructed patient and daughter that Aldine Contes would make initial visit 3/31.

## 2015-04-16 NOTE — Progress Notes (Signed)
Patient discharged home with home health as per order, discharged instruction provided, iv removed tele removed, prescription given to patient

## 2015-04-16 NOTE — Progress Notes (Signed)
Physical Therapy Treatment Patient Details Name: Tom Nguyen MRN: [id number] DOB: 07-18-1932 Today's Date: 04/16/2015    History of Present Illness 82 yo M presented to ED for increased difficulty breating and low oxygen saturation. He was found to have respiratory failure and COPD exacerbation. PMH includes HTN, CHF, LBBB, COPD, MI, CABG x5 and pacemaker.    PT Comments    Pt progressing well towards functional goals. He was able to increase ambulation distance with maintained oxygen saturation. Pt navigated stairs with FWW and min assist for safe technique. He is occasionally impulsive and requires cues for safety awareness. SpO2 ranged 94 to 100% on 2L. Pt will benefit from continued skilled PT to increase functional I and return to PLOF.  Follow Up Recommendations  Home health PT;Supervision - Intermittent     Equipment Recommendations  None recommended by PT    Recommendations for Other Services       Precautions / Restrictions Precautions Precautions: Fall;ICD/Pacemaker Restrictions Weight Bearing Restrictions: No    Mobility  Bed Mobility               General bed mobility comments: up in recliner, NT  Transfers Overall transfer level: Needs assistance Equipment used: Rolling walker (2 wheeled) Transfers: Sit to/from [employer] Transfers Sit to Stand: Supervision Stand pivot transfers: Supervision       General transfer comment: pt steady with no LOB; cues for hand placement and safety  Ambulation/Gait Ambulation/Gait assistance: Min guard Ambulation Distance (Feet): 250 Feet Assistive device: Rolling walker (2 wheeled) Gait Pattern/deviations: Decreased stride length;Trunk flexed;Narrow base of support     General Gait Details: Demonstrated steady gait with no LOB. Cues given to increase BOS for improved stability. Often slides feet on the floor, with cues for increased foot clearance.   Stairs Stairs: Yes Stairs assistance: Min  assist Stair Management: With walker Number of Stairs: 6 General stair comments: cues for placement of FWW and safe technique; one posterior LOB without self correction; pt cues for anterior weight shifting to prevent posterior LOB  Wheelchair Mobility    Modified Rankin (Stroke Patients Only)       Balance Overall balance assessment: Needs assistance Sitting-balance support: No upper extremity supported Sitting balance-Leahy Scale: Good     Standing balance support: No upper extremity supported Standing balance-Leahy Scale: Fair Standing balance comment: maintains with no LOB                    Cognition Arousal/Alertness: Awake/alert Behavior During Therapy: WFL for tasks assessed/performed Overall Cognitive Status: Within Functional Limits for tasks assessed                      Exercises Other Exercises Other Exercises: B LE therex: ankle pumps, LAQs, marching, hip add squeezes, hip abd with manual resistance x10 each. Cues for proper technique. Monitored vitals frequently. Therapeutic rest breaks for energy conservation. Other Exercises: Pt ambulated 250  and 200 ft with FWW and min guard with steady gait. Cues provided for improved gait pattern and safety. See ambulation section.     General Comments        Pertinent Vitals/Pain Pain Assessment: No/denies pain    Home Living                      Prior Function            PT Goals (current goals can now be found in the care plan section) Acute Rehab  PT Goals Patient Stated Goal: to get better PT Goal Formulation: With patient Time For Goal Achievement: 04/28/15 Potential to Achieve Goals: Good Progress towards PT goals: Progressing toward goals    Frequency  Min 2X/week    PT Plan Current plan remains appropriate    Co-evaluation             End of Session Equipment Utilized During Treatment: Gait belt;Oxygen Activity Tolerance: Patient tolerated treatment  well Patient left: in chair;with call bell/phone within reach;with chair alarm set;with nursing/sitter in room     Time: 2440-1027 PT Time Calculation (min) (ACUTE ONLY): 24 min  Charges:  $Gait Training: 8-22 mins $Therapeutic Exercise: 8-22 mins                    G Codes:      Adelene Idler, PT, DPT  04/16/2015, 1:32 PM [phone]

## 2015-04-17 ENCOUNTER — Telehealth: Payer: Self-pay

## 2015-04-17 NOTE — Telephone Encounter (Signed)
Unable to reach patient.  No answer.  Not able to leave a message.  Will continue to follow as appropriate with transitional care management.

## 2015-04-18 LAB — CULTURE, BLOOD (ROUTINE X 2)
CULTURE: NO GROWTH
CULTURE: NO GROWTH

## 2015-04-20 ENCOUNTER — Telehealth: Payer: Self-pay

## 2015-04-20 NOTE — Telephone Encounter (Signed)
Unable to reach patient.  Will continue to follow as appropriate for transitional care management. 

## 2015-04-21 ENCOUNTER — Telehealth: Payer: Self-pay

## 2015-04-21 NOTE — Telephone Encounter (Signed)
Transition Care Management Follow-up Telephone Call   Date discharged? 04/16/15   How have you been since you were released from the hospital? Doing better. Still coughing.  Little to no SOB. No chest pain.  Eating/drinking/voiding without issues.  No N/V.   Do you understand why you were in the hospital? YES, COPD and CHF flared up.    Do you understand the discharge instructions? YES, increasing activity slowly, using home oxygen set at 2L and CPAP at night.  Participating with PT.   Where were you discharged to? Home.   Items Reviewed:  Medications reviewed: YES, started taking Tessalon 200mg , Tussionex 35mL, Levaquin 500mg , Potassium Chloride 4m and Prednisone taper without problems.  Taking all other scheduled medications without issues  Allergies reviewed: YES,  no changes.  Dietary changes reviewed: YES, low salt diet, no problems.  Referrals reviewed: YES, HH in progress and follow up scheduled with PCP.   Functional Questionnaire:   Activities of Daily Living (ADLs):   He states they are independent in the following: Toileting, self feeding, grooming. States they require assistance with the following: Ambulates (uses walker/cane), bathing, dressing, meal prep.  Home health assists.    Any transportation issues/concerns?:  NO.   Any patient concerns? Not at this time.   Confirmed importance and date/time of follow-up visits scheduled YES, appointment scheduled 04/23/15 at 10:00.  Provider Appointment booked with Dr. (PCP).  Confirmed with patient if condition begins to worsen call PCP or go to the ER.  Patient was given the office number and encouraged to call back with question or concerns.  : Yes, patient verbalized understanding.

## 2015-04-23 ENCOUNTER — Ambulatory Visit (INDEPENDENT_AMBULATORY_CARE_PROVIDER_SITE_OTHER): Payer: Medicare Other | Admitting: Family Medicine

## 2015-04-23 ENCOUNTER — Encounter: Payer: Self-pay | Admitting: Family Medicine

## 2015-04-23 VITALS — BP 130/86 | HR 82 | Temp 97.7°F | Ht 68.0 in | Wt 160.1 lb

## 2015-04-23 DIAGNOSIS — I255 Ischemic cardiomyopathy: Secondary | ICD-10-CM | POA: Diagnosis not present

## 2015-04-23 DIAGNOSIS — J9601 Acute respiratory failure with hypoxia: Secondary | ICD-10-CM

## 2015-04-23 DIAGNOSIS — J441 Chronic obstructive pulmonary disease with (acute) exacerbation: Secondary | ICD-10-CM | POA: Diagnosis not present

## 2015-04-23 DIAGNOSIS — R634 Abnormal weight loss: Secondary | ICD-10-CM | POA: Diagnosis not present

## 2015-04-23 MED ORDER — MIRTAZAPINE 7.5 MG PO TABS: 7.5000 mg | ORAL_TABLET | Freq: Every day | ORAL | Status: DC

## 2015-04-23 NOTE — Patient Instructions (Signed)
Take the remeron as prescribed.  Schedule follow up with Cardiology and Pulmonology.  Return the stool cards.  Restart the iron (twice daily).  Follow up in 2 weeks - 1 month.  Take care  Dr. Adriana Simas

## 2015-04-24 DIAGNOSIS — R634 Abnormal weight loss: Secondary | ICD-10-CM | POA: Insufficient documentation

## 2015-04-24 NOTE — Progress Notes (Signed)
Subjective:  Patient ID: Tom Nguyen, male    DOB: 07/26/1932  Age: 82 y.o. MRN: [id number]  CC: Hospital followup  HPI:  82 year old male with a comp located past medical history including CAD, ischemic cardiomyopathy, COPD, CKD presents for hospital follow-up.  Patient was recently admitted from 3/27 - 3/30.  Hospital course and discharge summary was reviewed and is summarized as below.  Patient presented with shortness of breath and cough. Subsequently admitted for hypoxic respiratory failure secondary to COPD exacerbation and heart failure exacerbation (BNP was greater than 4000). Troponin was elevated likely from demand ischemia. He was treated with IV antibiotics, steroids, and diuresis. He did well during hospitalization and discharge home in stable condition.  He presents today reporting that he feels okay. He is feeling better today than he has in the past several days. Continues to have shortness of breath and is currently on oxygen. Appetite remains poor but is slightly increasing. He has once again lost some weight. His wife is concerned about this. Wife also concerned about depressed mood as he's quite agitated often. No recent chest pain. No other complaints this time.  Social Hx   Social History   Social History  . Marital Status: Married    Spouse Name: N/A  . Number of Children: N/A  . Years of Education: N/A   Social History Main Topics  . Smoking status: Former Smoker    Quit date: 02/15/1993  . Smokeless tobacco: Never Used  . Alcohol Use: 0.0 - 0.6 oz/week    0-1 Standard drinks or equivalent per week  . Drug Use: No  . Sexual Activity: Not Asked   Other Topics Concern  . None   Social History Narrative   Review of Systems  Constitutional: Positive for appetite change and unexpected weight change.  Respiratory: Positive for cough and shortness of breath.   Cardiovascular: Negative for chest pain.  Neurological: Positive for weakness.   Objective:    BP 130/86 mmHg  Pulse 82  Temp(Src) 97.7 F (36.5 C) (Oral)  Ht 5\' 8"  (1.727 m)  Wt 160 lb 2 oz (72.632 kg)  BMI 24.35 kg/m2  SpO2 93%  BP/Weight 04/23/2015 04/16/2015 04/13/2015  Systolic BP 130 93 -  Diastolic BP 86 60 -  Wt. (Lbs) 160.13 - 164.7  BMI 24.35 - 25.05   Physical Exam  Constitutional:  Chronically ill-appearing, nasal cannula oxygen place, appears slightly dyspneic.  Eyes: Conjunctivae are normal. No scleral icterus.  Cardiovascular: Normal rate and regular rhythm.   Pulmonary/Chest: Effort normal.  Coarse breath sounds.   Abdominal: Soft. He exhibits no distension.  Neurological: He is alert.  Psychiatric:  Flat affect.   Vitals reviewed.  Lab Results  Component Value Date   WBC 4.3 04/14/2015   HGB 10.7* 04/14/2015   HCT 32.4* 04/14/2015   PLT 88* 04/14/2015   GLUCOSE 145* 04/14/2015   CHOL 91 10/20/2014   TRIG 112.0 10/20/2014   HDL 40.00 10/20/2014   LDLCALC 28 10/20/2014   ALT 26 04/13/2015   AST 26 04/13/2015   NA 137 04/14/2015   K 3.9 04/14/2015   CL 99* 04/14/2015   CREATININE 1.21 04/14/2015   BUN 26* 04/14/2015   CO2 30 04/14/2015   HGBA1C 5.5 10/20/2014    Assessment & Plan:   Problem List Items Addressed This Visit    Cardiomyopathy, ischemic    Advised close cardiology follow-up.      COPD exacerbation (HCC)    Patient finishing course of steroids  and antibiotics. Advised him to follow-up closely with pulmonology.      Respiratory failure (HCC) - Primary    New acute problem. Hospital course and discharge summary reviewed and summarized in history of present illness. Medications reviewed today. Patient stable currently appears to be doing okay following hospital admission.      Weight loss    Weight slowly declining with decline in status (worsening COPD, SOB). Wife with some reports of mood issues as well. Starting Remeron.        Meds ordered this encounter  Medications  . mirtazapine (REMERON) 7.5 MG tablet     Sig: Take 1 tablet (7.5 mg total) by mouth at bedtime.    Dispense:  90 tablet    Refill:  0   Follow-up: 2 weeks - 1 month.  Everlene Other DO [facility]

## 2015-04-24 NOTE — Assessment & Plan Note (Signed)
New acute problem. Hospital course and discharge summary reviewed and summarized in history of present illness. Medications reviewed today. Patient stable currently appears to be doing okay following hospital admission.

## 2015-04-24 NOTE — Assessment & Plan Note (Signed)
Weight slowly declining with decline in status (worsening COPD, SOB). Wife with some reports of mood issues as well. Starting Remeron.

## 2015-04-24 NOTE — Assessment & Plan Note (Signed)
Patient finishing course of steroids and antibiotics. Advised him to follow-up closely with pulmonology.

## 2015-04-24 NOTE — Assessment & Plan Note (Signed)
Advised close cardiology follow-up.

## 2015-04-27 ENCOUNTER — Telehealth: Payer: Self-pay

## 2015-04-27 NOTE — Telephone Encounter (Signed)
Lawson Fiscal from [facility] called. She was at patients home and he status has changed since we last saw the patient.  He has remarkable changes with his vitals, BP was 108/48, O2 on room air was 93-94% when she arrived, he put his Oxygen back on and it rose to 95-96% and his color was slightly better.  Patients heart rate is 56 with a lot of irregularities noted.  No fever present.  He has been unsteady for the past 1-2 days and it seems like it is getting worse.  Patient has generalized weakness, numbness in his right hand under the middle finger that is going up his arm to under his armpit.  His appetite is unchanged, no changes in BM, Urination .  He has increased his fluid intake so his urine is yellow in color per the patient.  He has not followed up with is cardiologist or the pulmonologist yet.  A nurse is scheduled to see him tomorrow.  Lawson Fiscal will be back out on Wednesday to see him.  Please advise. Thanks

## 2015-04-27 NOTE — Telephone Encounter (Signed)
This is similar to when I saw him. If he is declining further, he needs to be seen. May need to go to the hospital.

## 2015-04-28 ENCOUNTER — Encounter: Payer: Self-pay | Admitting: Family Medicine

## 2015-04-28 ENCOUNTER — Ambulatory Visit (INDEPENDENT_AMBULATORY_CARE_PROVIDER_SITE_OTHER): Payer: Medicare Other | Admitting: Family Medicine

## 2015-04-28 VITALS — BP 124/74 | HR 70 | Temp 98.1°F | Ht 68.0 in | Wt 158.5 lb

## 2015-04-28 DIAGNOSIS — I255 Ischemic cardiomyopathy: Secondary | ICD-10-CM | POA: Diagnosis not present

## 2015-04-28 DIAGNOSIS — J449 Chronic obstructive pulmonary disease, unspecified: Secondary | ICD-10-CM | POA: Diagnosis not present

## 2015-04-28 NOTE — Progress Notes (Signed)
Pre visit review using our clinic review tool, if applicable. No additional management support is needed unless otherwise documented below in the visit note. 

## 2015-04-28 NOTE — Telephone Encounter (Signed)
Home Health nurse Chana Bode called today.  Patient's vitals are again all over the place.  His BP was 120/60, heart rate 93, 96% on oxygen today and temp was 95.2.  I have scheduled him to see you today at 430.  Patient is happy with that outcome.  Thanks

## 2015-04-28 NOTE — Telephone Encounter (Signed)
FYI: patient on schedule for 430 today.

## 2015-04-29 NOTE — Progress Notes (Signed)
Subjective:  Patient ID: Tom Nguyen, male    DOB: 1932-09-06  Age: 82 y.o. MRN: [id number]  CC: Change in status/vitals per home health  HPI:  82 year old male with a complicated PMH including severe COPD, CAD, ischemic cardiomyopathy presents with the above issue.  Our office has recently been contacted by his home health nurse who has been expressing concern about his status. Per the nurse his vitals have been fluctuating, particularly his O2 sat. Additionally, she felt that his status was declining - weakness, poor appetite, etc.   Patient states that he feels like his is doing fine.  He states that nothing has changed since our last visit. He is using O2 with activity. No increasing/worsening SOB. No chest pain. Appetite is poor, per wife (I recently started him on Remeron to aid this). He does not LE edema.  He has scheduled follow up with his specialists.   Social Hx   Social History   Social History  . Marital Status: Married    Spouse Name: N/A  . Number of Children: N/A  . Years of Education: N/A   Social History Main Topics  . Smoking status: Former Smoker    Quit date: 02/15/1993  . Smokeless tobacco: Never Used  . Alcohol Use: 0.0 - 0.6 oz/week    0-1 Standard drinks or equivalent per week  . Drug Use: No  . Sexual Activity: Not Asked   Other Topics Concern  . None   Social History Narrative   Review of Systems  Constitutional: Positive for appetite change and fatigue.  Respiratory:       SOB stable.  Cardiovascular: Positive for leg swelling.   Objective:  BP 124/74 mmHg  Pulse 70  Temp(Src) 98.1 F (36.7 C) (Oral)  Ht 5\' 8"  (1.727 m)  Wt 158 lb 8 oz (71.895 kg)  BMI 24.11 kg/m2  SpO2 97%  BP/Weight 04/28/2015 04/23/2015 04/16/2015  Systolic BP 124 130 93  Diastolic BP 74 86 60  Wt. (Lbs) 158.5 160.13 -  BMI 24.11 24.35 -   Physical Exam  Constitutional:  Chronically ill appearing male, [place] O2 in place. No acute distress.  HENT:  Head:  Normocephalic and atraumatic.  Eyes: Conjunctivae are normal. No scleral icterus.  Cardiovascular: Normal rate and regular rhythm.   1+ LE edema.  Pulmonary/Chest: Effort normal.  Crackles/coarse breath sounds throughout.  Neurological: He is alert.  Vitals reviewed.  Lab Results  Component Value Date   WBC 4.3 04/14/2015   HGB 10.7* 04/14/2015   HCT 32.4* 04/14/2015   PLT 88* 04/14/2015   GLUCOSE 145* 04/14/2015   CHOL 91 10/20/2014   TRIG 112.0 10/20/2014   HDL 40.00 10/20/2014   LDLCALC 28 10/20/2014   ALT 26 04/13/2015   AST 26 04/13/2015   NA 137 04/14/2015   K 3.9 04/14/2015   CL 99* 04/14/2015   CREATININE 1.21 04/14/2015   BUN 26* 04/14/2015   CO2 30 04/14/2015   HGBA1C 5.5 10/20/2014    Assessment & Plan:   Problem List Items Addressed This Visit    COPD (chronic obstructive pulmonary disease) (HCC) - Primary    Home health nurse concerned about patient particularly O2 sat. Patient's COPD is near end stage per pulmonology.  His status is slowly declining and there is little to do at this point. Patient appears stable today. Continue home medications and O2.         Follow-up: As scheduled  12/20/2014 DO [facility]  [employer]

## 2015-04-29 NOTE — Assessment & Plan Note (Signed)
Home health nurse concerned about patient particularly O2 sat. Patient's COPD is near end stage per pulmonology.  His status is slowly declining and there is little to do at this point. Patient appears stable today. Continue home medications and O2.

## 2015-04-30 ENCOUNTER — Other Ambulatory Visit: Payer: Self-pay

## 2015-04-30 ENCOUNTER — Other Ambulatory Visit (INDEPENDENT_AMBULATORY_CARE_PROVIDER_SITE_OTHER): Payer: Medicare Other

## 2015-04-30 DIAGNOSIS — R634 Abnormal weight loss: Secondary | ICD-10-CM | POA: Diagnosis not present

## 2015-04-30 LAB — FECAL OCCULT BLOOD, IMMUNOCHEMICAL: FECAL OCCULT BLD: POSITIVE — AB

## 2015-05-05 ENCOUNTER — Other Ambulatory Visit: Payer: Self-pay | Admitting: Family Medicine

## 2015-05-05 DIAGNOSIS — R195 Other fecal abnormalities: Secondary | ICD-10-CM

## 2015-05-06 ENCOUNTER — Ambulatory Visit: Payer: Medicare Other | Admitting: Nurse Practitioner

## 2015-05-07 ENCOUNTER — Ambulatory Visit: Payer: Medicare Other | Admitting: Family Medicine

## 2015-05-15 ENCOUNTER — Other Ambulatory Visit: Payer: Self-pay | Admitting: Family Medicine

## 2015-05-15 LAB — CUP PACEART REMOTE DEVICE CHECK
Battery Remaining Percentage: 81 %
HighPow Impedance: 49 Ohm
Implantable Lead Implant Date: 20110519
Implantable Lead Location: 753860
Implantable Lead Model: 154
Implantable Lead Serial Number: 160386
Implantable Lead Serial Number: 353235
Lead Channel Impedance Value: 442 Ohm
Lead Channel Sensing Intrinsic Amplitude: 15.7 mV
Lead Channel Sensing Intrinsic Amplitude: 4.9 mV
Lead Channel Setting Pacing Amplitude: 2 V
Lead Channel Setting Pacing Amplitude: 2 V
Lead Channel Setting Pacing Pulse Width: 0.5 ms
Lead Channel Setting Sensing Sensitivity: 1 mV
MDC IDC LEAD IMPLANT DT: 20110519
MDC IDC LEAD IMPLANT DT: 20110519
MDC IDC LEAD LOCATION: 753858
MDC IDC LEAD LOCATION: 753859
MDC IDC LEAD SERIAL: 109634
MDC IDC MSMT BATTERY REMAINING LONGEVITY: 54 mo
MDC IDC MSMT LEADCHNL LV IMPEDANCE VALUE: 384 Ohm
MDC IDC MSMT LEADCHNL LV SENSING INTR AMPL: 11.2 mV
MDC IDC MSMT LEADCHNL RV IMPEDANCE VALUE: 413 Ohm
MDC IDC PG SERIAL: 588213
MDC IDC SESS DTM: 20170320094300
MDC IDC SET LEADCHNL LV PACING PULSEWIDTH: 1 ms
MDC IDC SET LEADCHNL RV PACING AMPLITUDE: 2 V
MDC IDC SET LEADCHNL RV SENSING SENSITIVITY: 0.6 mV
MDC IDC STAT BRADY RA PERCENT PACED: 19 %
MDC IDC STAT BRADY RV PERCENT PACED: 83 %

## 2015-05-27 ENCOUNTER — Telehealth: Payer: Self-pay | Admitting: *Deleted

## 2015-05-27 NOTE — Telephone Encounter (Signed)
FYI

## 2015-05-27 NOTE — Telephone Encounter (Signed)
[facility] stated that pt will be out of town until 05/15. They will follow up with pt.when he returns.

## 2015-06-09 ENCOUNTER — Encounter: Payer: Self-pay | Admitting: Cardiology

## 2015-06-10 ENCOUNTER — Telehealth: Payer: Self-pay | Admitting: *Deleted

## 2015-06-10 NOTE — Telephone Encounter (Signed)
Noted  

## 2015-06-10 NOTE — Telephone Encounter (Signed)
FYI Patient's family member, stated that discharged from [facility] home care on today, he's doing well. He's scheduled for a follow up appt wit Dr. Adriana Simas on 05/31

## 2015-06-12 ENCOUNTER — Telehealth: Payer: Self-pay | Admitting: Family Medicine

## 2015-06-12 MED ORDER — LEVOTHYROXINE SODIUM 75 MCG PO TABS: 75.0000 ug | ORAL_TABLET | Freq: Every day | ORAL | Status: DC

## 2015-06-12 NOTE — Telephone Encounter (Signed)
Refilled

## 2015-06-12 NOTE — Telephone Encounter (Signed)
Pt called back with address and phone number for [facility] [address] [address] [place] , [place]  [address]. Phone number [phone]

## 2015-06-12 NOTE — Telephone Encounter (Signed)
Pt called about needing a refill for levothyroxine (SYNTHROID, LEVOTHROID) 75 MCG tablet.   Pharmacy is [facility]  Call pt @ [phone]. Thank you!

## 2015-06-16 ENCOUNTER — Ambulatory Visit: Payer: Medicare Other | Admitting: Pulmonary Disease

## 2015-06-16 ENCOUNTER — Encounter: Payer: Self-pay | Admitting: Pulmonary Disease

## 2015-06-17 ENCOUNTER — Encounter: Payer: Self-pay | Admitting: Family Medicine

## 2015-06-17 ENCOUNTER — Telehealth: Payer: Self-pay | Admitting: Family Medicine

## 2015-06-17 ENCOUNTER — Ambulatory Visit (INDEPENDENT_AMBULATORY_CARE_PROVIDER_SITE_OTHER): Payer: Medicare Other | Admitting: Family Medicine

## 2015-06-17 VITALS — BP 106/62 | HR 83 | Temp 98.1°F | Ht 68.0 in | Wt 163.2 lb

## 2015-06-17 DIAGNOSIS — R634 Abnormal weight loss: Secondary | ICD-10-CM

## 2015-06-17 DIAGNOSIS — I2510 Atherosclerotic heart disease of native coronary artery without angina pectoris: Secondary | ICD-10-CM

## 2015-06-17 DIAGNOSIS — J449 Chronic obstructive pulmonary disease, unspecified: Secondary | ICD-10-CM | POA: Diagnosis not present

## 2015-06-17 DIAGNOSIS — I255 Ischemic cardiomyopathy: Secondary | ICD-10-CM

## 2015-06-17 DIAGNOSIS — I10 Essential (primary) hypertension: Secondary | ICD-10-CM

## 2015-06-17 DIAGNOSIS — D649 Anemia, unspecified: Secondary | ICD-10-CM | POA: Diagnosis not present

## 2015-06-17 MED ORDER — POTASSIUM CHLORIDE ER 20 MEQ PO TBCR: 20.0000 meq | EXTENDED_RELEASE_TABLET | Freq: Every day | ORAL | Status: DC

## 2015-06-17 MED ORDER — CARVEDILOL 12.5 MG PO TABS: 12.5000 mg | ORAL_TABLET | Freq: Two times a day (BID) | ORAL | Status: DC

## 2015-06-17 MED ORDER — LISINOPRIL 20 MG PO TABS: 20.0000 mg | ORAL_TABLET | Freq: Every day | ORAL | Status: DC

## 2015-06-17 MED ORDER — OMEPRAZOLE 20 MG PO CPDR: 20.0000 mg | DELAYED_RELEASE_CAPSULE | Freq: Every day | ORAL | Status: AC

## 2015-06-17 NOTE — Assessment & Plan Note (Signed)
Likely anemia of chronic disease. Has seen GI. Will continue to monitor.

## 2015-06-17 NOTE — Progress Notes (Signed)
Pre visit review using our clinic review tool, if applicable. No additional management support is needed unless otherwise documented below in the visit note. 

## 2015-06-17 NOTE — Telephone Encounter (Signed)
Pt needs refills on the following meds sent to [facility] [place], [place] omeprazole (PRILOSEC) 20 MG capsule lisinopril (PRINIVIL,ZESTRIL) 20 MG tablet potassium chloride 20 MEQ TBCR carvedilol (COREG) 12.5 MG tablet

## 2015-06-17 NOTE — Assessment & Plan Note (Signed)
Stable. Continue current meds.   

## 2015-06-17 NOTE — Assessment & Plan Note (Signed)
Improving following Remeron. Will continue. Will monitor closely.

## 2015-06-17 NOTE — Progress Notes (Signed)
Subjective:  Patient ID: Tom Nguyen, male    DOB: 30-Oct-1932  Age: 82 y.o. MRN: [id number]  CC: Follow up  HPI:  82 year old male with a complicated past medical history including advanced COPD, CKD, ischemic cardiomyopathy, CAD, HTN, HLD presents for follow up.  Weight loss  Has gained weight since being on Remeron.  Weight up today.  COPD  Advanced.  Stable currently on Anoro and supplemental oxygen.  CAD  Stable.  No recent chest pain.  Has significant DOE. Multifactorial and predominantly from advanced COPD.  Anemia  Has had problems with rectal bleeding/hemorrhoids. FOBT +.  Following with GI.  Anemia improved.  HTN  Stable on Coreg, Lisinopril, Inspra, Lasix.  Social Hx   Social History   Social History  . Marital Status: Married    Spouse Name: N/A  . Number of Children: N/A  . Years of Education: N/A   Social History Main Topics  . Smoking status: Former Smoker    Quit date: 02/15/1993  . Smokeless tobacco: Never Used  . Alcohol Use: 0.0 - 0.6 oz/week    0-1 Standard drinks or equivalent per week  . Drug Use: No  . Sexual Activity: Not Asked   Other Topics Concern  . None   Social History Narrative   Review of Systems  Constitutional: Positive for fatigue.  Respiratory: Positive for shortness of breath.   Cardiovascular: Negative for chest pain.  Endocrine:       Feels cold often.   Objective:  BP 106/62 mmHg  Pulse 83  Temp(Src) 98.1 F (36.7 C) (Oral)  Ht 5\' 8"  (1.727 m)  Wt 163 lb 3 oz (74.021 kg)  BMI 24.82 kg/m2  SpO2 94%  BP/Weight 06/17/2015 04/28/2015 04/23/2015  Systolic BP 106 124 130  Diastolic BP 62 74 86  Wt. (Lbs) 163.19 158.5 160.13  BMI 24.82 24.11 24.35   Physical Exam  Constitutional: He is oriented to person, place, and time.  Chronically ill appearing male, [place] O2 in place.  Cardiovascular: Normal rate and regular rhythm.   Systolic murmur.  Pulmonary/Chest:  Mild increased WOB, particularly with  speech. Diminished lung sounds. Otherwise clear.  Abdominal: Soft. He exhibits no distension. There is no tenderness. There is no rebound and no guarding.  Neurological: He is alert and oriented to person, place, and time.  Psychiatric: He has a normal mood and affect.  Vitals reviewed.  Lab Results  Component Value Date   WBC 4.3 04/14/2015   HGB 10.7* 04/14/2015   HCT 32.4* 04/14/2015   PLT 88* 04/14/2015   GLUCOSE 145* 04/14/2015   CHOL 91 10/20/2014   TRIG 112.0 10/20/2014   HDL 40.00 10/20/2014   LDLCALC 28 10/20/2014   ALT 26 04/13/2015   AST 26 04/13/2015   NA 137 04/14/2015   K 3.9 04/14/2015   CL 99* 04/14/2015   CREATININE 1.21 04/14/2015   BUN 26* 04/14/2015   CO2 30 04/14/2015   HGBA1C 5.5 10/20/2014    Assessment & Plan:   Problem List Items Addressed This Visit    Weight loss - Primary    Improving following Remeron. Will continue. Will monitor closely.      HTN (hypertension)    Stable. Continue current meds.      COPD (chronic obstructive pulmonary disease) (HCC)    End stage per pulm. Stable at this time but his status has been slowly declining. Continue Anoro and supplemental oxygen.      CAD (coronary artery disease)  Stable. Continue current meds.      Anemia    Likely anemia of chronic disease. Has seen GI. Will continue to monitor.        Follow-up: 3 months.  Everlene Other DO [facility]

## 2015-06-17 NOTE — Assessment & Plan Note (Signed)
End stage per pulm. Stable at this time but his status has been slowly declining. Continue Anoro and supplemental oxygen.

## 2015-06-17 NOTE — Patient Instructions (Signed)
Continue your current medications.  Be sure to follow up closely with your specialists  I'll see you in 3 months.  Take care  Dr. Adriana Simas

## 2015-07-01 ENCOUNTER — Other Ambulatory Visit: Payer: Self-pay

## 2015-07-01 MED ORDER — SIMVASTATIN 40 MG PO TABS: 40.0000 mg | ORAL_TABLET | Freq: Every day | ORAL | Status: DC

## 2015-07-06 ENCOUNTER — Ambulatory Visit (INDEPENDENT_AMBULATORY_CARE_PROVIDER_SITE_OTHER): Payer: Medicare Other | Admitting: *Deleted

## 2015-07-06 DIAGNOSIS — I255 Ischemic cardiomyopathy: Secondary | ICD-10-CM | POA: Diagnosis not present

## 2015-07-06 NOTE — Progress Notes (Signed)
Remote ICD transmission.   

## 2015-07-07 LAB — CUP PACEART REMOTE DEVICE CHECK
Battery Remaining Longevity: 48 mo
Battery Remaining Percentage: 74 %
Brady Statistic RA Percent Paced: 16 %
Brady Statistic RV Percent Paced: 84 %
HighPow Impedance: 51 Ohm
Implantable Lead Implant Date: 20110519
Implantable Lead Location: 753858
Implantable Lead Location: 753860
Implantable Lead Model: 4525
Implantable Lead Serial Number: 109634
Implantable Lead Serial Number: 353235
Lead Channel Impedance Value: 410 Ohm
Lead Channel Impedance Value: 442 Ohm
Lead Channel Pacing Threshold Amplitude: 1.3 V
Lead Channel Pacing Threshold Amplitude: 1.4 V
Lead Channel Pacing Threshold Pulse Width: 1 ms
Lead Channel Setting Pacing Amplitude: 2 V
Lead Channel Setting Sensing Sensitivity: 0.6 mV
Lead Channel Setting Sensing Sensitivity: 1 mV
MDC IDC LEAD IMPLANT DT: 20110519
MDC IDC LEAD IMPLANT DT: 20110519
MDC IDC LEAD LOCATION: 753859
MDC IDC LEAD SERIAL: 160386
MDC IDC MSMT LEADCHNL RA PACING THRESHOLD PULSEWIDTH: 0.5 ms
MDC IDC MSMT LEADCHNL RV IMPEDANCE VALUE: 393 Ohm
MDC IDC MSMT LEADCHNL RV PACING THRESHOLD AMPLITUDE: 0.5 V
MDC IDC MSMT LEADCHNL RV PACING THRESHOLD PULSEWIDTH: 0.5 ms
MDC IDC PG SERIAL: 588213
MDC IDC SESS DTM: 20170619091200
MDC IDC SET LEADCHNL LV PACING AMPLITUDE: 2 V
MDC IDC SET LEADCHNL LV PACING PULSEWIDTH: 1 ms
MDC IDC SET LEADCHNL RA PACING AMPLITUDE: 2 V
MDC IDC SET LEADCHNL RV PACING PULSEWIDTH: 0.5 ms

## 2015-07-10 ENCOUNTER — Encounter: Payer: Self-pay | Admitting: Cardiology

## 2015-07-13 ENCOUNTER — Encounter: Payer: Self-pay | Admitting: Internal Medicine

## 2015-07-16 ENCOUNTER — Other Ambulatory Visit: Payer: Self-pay | Admitting: Family Medicine

## 2015-07-17 NOTE — Telephone Encounter (Signed)
Last OV 5/17 ok to fill Remeron?

## 2015-08-05 ENCOUNTER — Telehealth: Payer: Self-pay | Admitting: Family Medicine

## 2015-08-05 ENCOUNTER — Telehealth: Payer: Self-pay | Admitting: *Deleted

## 2015-08-05 NOTE — Telephone Encounter (Signed)
Here is the team health note for the same patient, sent additional telephone note to you. Please advise, thanks

## 2015-08-05 NOTE — Telephone Encounter (Signed)
Spoke with the patient, he has no car to get here at this point, will call me if that changes, scheduled for tomorrow am.

## 2015-08-05 NOTE — Telephone Encounter (Signed)
No available appts at our office today, please advise as he is refusing to go to ED. thanks

## 2015-08-05 NOTE — Telephone Encounter (Signed)
The nurse line stated that patient has emphazema, he has a little more SOB, he wanted to be seen by Dr. Adriana Simas only today. He refused going to the emergency room, or another provide. Nurse Note in chart

## 2015-08-05 NOTE — Telephone Encounter (Signed)
You can make a slot for him.

## 2015-08-05 NOTE — Telephone Encounter (Signed)
Patient Name: Tom Nguyen  DOB: 08-Dec-1932    Initial Comment Caller says wants to make an appt for her dad, he looks pale, he has no appetite, shortness of breath which is not new, depressed for over a week    Nurse Assessment  Nurse: Stefano Gaul, RN, Dwana Curd Date/Time (Eastern Time): 08/05/2015 11:48:53 AM  Confirm and document reason for call. If symptomatic, describe symptoms. You must click the next button to save text entered. ---Caller states he needs an appt. he does not have an appt. He is having trouble breathing. He is using his oxygen. Breathing has gotten worse the past 3 days. He has been coughing some. Coughing up phlegm. He has tingling in his feet which he has had for a while. no fever. No chest pain.  Has the patient traveled out of the country within the last 30 days? ---Not Applicable  Does the patient have any new or worsening symptoms? ---Yes  Will a triage be completed? ---Yes  Related visit to physician within the last 2 weeks? ---No  Does the PT have any chronic conditions? (i.e. diabetes, asthma, etc.) ---Yes  List chronic conditions. ---COPD; quadruple bypass  Is this a behavioral health or substance abuse call? ---No     Guidelines    Guideline Title Affirmed Question Affirmed Notes  Cough - Chronic [1] Increasing difficulty breathing AND [2] always has some difficulty breathing    Final Disposition User   Go to ED Now (or PCP triage) Stefano Gaul, RN, Vera    Comments  called primary number and left message. Will try secondary number  attempted to call daughter back and left message.  no appts available with Dr. Adriana Simas and pt does not want to see another doctor.  Called back line and spoke to [place] and gave report that pt has emphysema, wears oxygen but states he has been more SOB the past 3 days with triage . Has no appetite. Daughter had called initially but left message for her but spoke to pt directly. Daughter states he has been depressed.  triage outcome was to go  to ER (or PCP triage). Dr. Adriana Simas has no appts and pt does not want to go to ER or see another doctor.   Referrals  GO TO FACILITY REFUSED   Disagree/Comply: Disagree  Disagree/Comply Reason: Disagree with instructions

## 2015-08-06 ENCOUNTER — Emergency Department: Payer: Medicare Other

## 2015-08-06 ENCOUNTER — Inpatient Hospital Stay (HOSPITAL_COMMUNITY)
Admit: 2015-08-06 | Discharge: 2015-08-06 | Disposition: A | Discharge status: Home / Self Care | Payer: Medicare Other | Attending: Internal Medicine | Admitting: Internal Medicine

## 2015-08-06 ENCOUNTER — Encounter: Payer: Self-pay | Admitting: Emergency Medicine

## 2015-08-06 ENCOUNTER — Inpatient Hospital Stay
Admission: EM | Admit: 2015-08-06 | Discharge: 2015-08-18 | DRG: 286 | Disposition: A | Discharge status: Home Health | Payer: Medicare Other | Attending: Specialist | Admitting: Specialist

## 2015-08-06 ENCOUNTER — Ambulatory Visit (INDEPENDENT_AMBULATORY_CARE_PROVIDER_SITE_OTHER): Payer: Medicare Other | Admitting: Family Medicine

## 2015-08-06 ENCOUNTER — Encounter: Payer: Self-pay | Admitting: Family Medicine

## 2015-08-06 ENCOUNTER — Inpatient Hospital Stay: Admit: 2015-08-06 | Payer: Medicare Other

## 2015-08-06 VITALS — BP 98/70 | HR 79 | Wt 170.0 lb

## 2015-08-06 DIAGNOSIS — R7989 Other specified abnormal findings of blood chemistry: Secondary | ICD-10-CM

## 2015-08-06 DIAGNOSIS — J441 Chronic obstructive pulmonary disease with (acute) exacerbation: Secondary | ICD-10-CM | POA: Diagnosis present

## 2015-08-06 DIAGNOSIS — I25119 Atherosclerotic heart disease of native coronary artery with unspecified angina pectoris: Secondary | ICD-10-CM | POA: Diagnosis present

## 2015-08-06 DIAGNOSIS — I5023 Acute on chronic systolic (congestive) heart failure: Secondary | ICD-10-CM | POA: Diagnosis not present

## 2015-08-06 DIAGNOSIS — I4891 Unspecified atrial fibrillation: Secondary | ICD-10-CM | POA: Diagnosis present

## 2015-08-06 DIAGNOSIS — I272 Other secondary pulmonary hypertension: Secondary | ICD-10-CM | POA: Diagnosis present

## 2015-08-06 DIAGNOSIS — N183 Chronic kidney disease, stage 3 (moderate): Secondary | ICD-10-CM | POA: Diagnosis present

## 2015-08-06 DIAGNOSIS — M069 Rheumatoid arthritis, unspecified: Secondary | ICD-10-CM | POA: Diagnosis present

## 2015-08-06 DIAGNOSIS — I25810 Atherosclerosis of coronary artery bypass graft(s) without angina pectoris: Secondary | ICD-10-CM | POA: Diagnosis present

## 2015-08-06 DIAGNOSIS — I447 Left bundle-branch block, unspecified: Secondary | ICD-10-CM | POA: Diagnosis present

## 2015-08-06 DIAGNOSIS — K219 Gastro-esophageal reflux disease without esophagitis: Secondary | ICD-10-CM | POA: Diagnosis present

## 2015-08-06 DIAGNOSIS — I509 Heart failure, unspecified: Secondary | ICD-10-CM

## 2015-08-06 DIAGNOSIS — N179 Acute kidney failure, unspecified: Secondary | ICD-10-CM

## 2015-08-06 DIAGNOSIS — I5043 Acute on chronic combined systolic (congestive) and diastolic (congestive) heart failure: Secondary | ICD-10-CM | POA: Diagnosis present

## 2015-08-06 DIAGNOSIS — I472 Ventricular tachycardia: Secondary | ICD-10-CM | POA: Diagnosis present

## 2015-08-06 DIAGNOSIS — I255 Ischemic cardiomyopathy: Secondary | ICD-10-CM | POA: Diagnosis present

## 2015-08-06 DIAGNOSIS — R531 Weakness: Secondary | ICD-10-CM

## 2015-08-06 DIAGNOSIS — N189 Chronic kidney disease, unspecified: Secondary | ICD-10-CM

## 2015-08-06 DIAGNOSIS — Z823 Family history of stroke: Secondary | ICD-10-CM

## 2015-08-06 DIAGNOSIS — Z7983 Long term (current) use of bisphosphonates: Secondary | ICD-10-CM

## 2015-08-06 DIAGNOSIS — N170 Acute kidney failure with tubular necrosis: Secondary | ICD-10-CM | POA: Diagnosis not present

## 2015-08-06 DIAGNOSIS — J432 Centrilobular emphysema: Secondary | ICD-10-CM | POA: Diagnosis present

## 2015-08-06 DIAGNOSIS — E785 Hyperlipidemia, unspecified: Secondary | ICD-10-CM | POA: Diagnosis present

## 2015-08-06 DIAGNOSIS — J9601 Acute respiratory failure with hypoxia: Secondary | ICD-10-CM | POA: Diagnosis not present

## 2015-08-06 DIAGNOSIS — E860 Dehydration: Secondary | ICD-10-CM | POA: Diagnosis present

## 2015-08-06 DIAGNOSIS — J9621 Acute and chronic respiratory failure with hypoxia: Secondary | ICD-10-CM | POA: Diagnosis not present

## 2015-08-06 DIAGNOSIS — K529 Noninfective gastroenteritis and colitis, unspecified: Secondary | ICD-10-CM | POA: Diagnosis present

## 2015-08-06 DIAGNOSIS — E039 Hypothyroidism, unspecified: Secondary | ICD-10-CM | POA: Diagnosis present

## 2015-08-06 DIAGNOSIS — Z95810 Presence of automatic (implantable) cardiac defibrillator: Secondary | ICD-10-CM

## 2015-08-06 DIAGNOSIS — Z87891 Personal history of nicotine dependence: Secondary | ICD-10-CM

## 2015-08-06 DIAGNOSIS — Z9981 Dependence on supplemental oxygen: Secondary | ICD-10-CM | POA: Diagnosis not present

## 2015-08-06 DIAGNOSIS — I1310 Hypertensive heart and chronic kidney disease without heart failure, with stage 1 through stage 4 chronic kidney disease, or unspecified chronic kidney disease: Secondary | ICD-10-CM | POA: Diagnosis not present

## 2015-08-06 DIAGNOSIS — Z79899 Other long term (current) drug therapy: Secondary | ICD-10-CM

## 2015-08-06 DIAGNOSIS — I5040 Unspecified combined systolic (congestive) and diastolic (congestive) heart failure: Secondary | ICD-10-CM | POA: Insufficient documentation

## 2015-08-06 DIAGNOSIS — R0600 Dyspnea, unspecified: Secondary | ICD-10-CM

## 2015-08-06 DIAGNOSIS — Z841 Family history of disorders of kidney and ureter: Secondary | ICD-10-CM

## 2015-08-06 DIAGNOSIS — I130 Hypertensive heart and chronic kidney disease with heart failure and stage 1 through stage 4 chronic kidney disease, or unspecified chronic kidney disease: Secondary | ICD-10-CM | POA: Diagnosis present

## 2015-08-06 DIAGNOSIS — Z515 Encounter for palliative care: Secondary | ICD-10-CM

## 2015-08-06 DIAGNOSIS — I248 Other forms of acute ischemic heart disease: Secondary | ICD-10-CM | POA: Diagnosis present

## 2015-08-06 DIAGNOSIS — Z7189 Other specified counseling: Secondary | ICD-10-CM | POA: Diagnosis not present

## 2015-08-06 DIAGNOSIS — R079 Chest pain, unspecified: Secondary | ICD-10-CM

## 2015-08-06 DIAGNOSIS — I252 Old myocardial infarction: Secondary | ICD-10-CM

## 2015-08-06 DIAGNOSIS — Z87442 Personal history of urinary calculi: Secondary | ICD-10-CM

## 2015-08-06 DIAGNOSIS — R197 Diarrhea, unspecified: Secondary | ICD-10-CM | POA: Diagnosis not present

## 2015-08-06 DIAGNOSIS — R778 Other specified abnormalities of plasma proteins: Secondary | ICD-10-CM | POA: Insufficient documentation

## 2015-08-06 DIAGNOSIS — G4730 Sleep apnea, unspecified: Secondary | ICD-10-CM | POA: Diagnosis present

## 2015-08-06 DIAGNOSIS — Z96611 Presence of right artificial shoulder joint: Secondary | ICD-10-CM | POA: Diagnosis present

## 2015-08-06 DIAGNOSIS — Z951 Presence of aortocoronary bypass graft: Secondary | ICD-10-CM

## 2015-08-06 DIAGNOSIS — Z8249 Family history of ischemic heart disease and other diseases of the circulatory system: Secondary | ICD-10-CM

## 2015-08-06 DIAGNOSIS — Z96612 Presence of left artificial shoulder joint: Secondary | ICD-10-CM | POA: Diagnosis present

## 2015-08-06 DIAGNOSIS — Z96653 Presence of artificial knee joint, bilateral: Secondary | ICD-10-CM | POA: Diagnosis present

## 2015-08-06 HISTORY — DX: Atherosclerotic heart disease of native coronary artery without angina pectoris: I25.10

## 2015-08-06 HISTORY — DX: Chronic respiratory failure, unspecified whether with hypoxia or hypercapnia: J96.10

## 2015-08-06 HISTORY — DX: Noninfective gastroenteritis and colitis, unspecified: K52.9

## 2015-08-06 HISTORY — DX: Chronic combined systolic (congestive) and diastolic (congestive) heart failure: I50.42

## 2015-08-06 HISTORY — DX: Personal history of other diseases of the digestive system: Z87.19

## 2015-08-06 HISTORY — DX: Hypothyroidism, unspecified: E03.9

## 2015-08-06 LAB — BRAIN NATRIURETIC PEPTIDE: B NATRIURETIC PEPTIDE 5: 2004 pg/mL — AB (ref 0.0–100.0)

## 2015-08-06 LAB — ECHOCARDIOGRAM COMPLETE: WEIGHTICAEL: 2656 [oz_av]

## 2015-08-06 LAB — BASIC METABOLIC PANEL
ANION GAP: 10 (ref 5–15)
BUN: 40 mg/dL — AB (ref 6–20)
CHLORIDE: 102 mmol/L (ref 101–111)
CO2: 26 mmol/L (ref 22–32)
Calcium: 8.4 mg/dL — ABNORMAL LOW (ref 8.9–10.3)
Creatinine, Ser: 1.99 mg/dL — ABNORMAL HIGH (ref 0.61–1.24)
GFR calc Af Amer: 35 mL/min — ABNORMAL LOW (ref >60)
GFR, EST NON AFRICAN AMERICAN: 30 mL/min — AB (ref >60)
GLUCOSE: 103 mg/dL — AB (ref 65–99)
POTASSIUM: 4.1 mmol/L (ref 3.5–5.1)
Sodium: 138 mmol/L (ref 135–145)

## 2015-08-06 LAB — LACTIC ACID, PLASMA
LACTIC ACID, VENOUS: 1.6 mmol/L (ref 0.5–1.9)
LACTIC ACID, VENOUS: 1.1 mmol/L (ref 0.5–1.9)

## 2015-08-06 LAB — CBC
HEMATOCRIT: 32.9 % — AB (ref 40.0–52.0)
HEMOGLOBIN: 11.2 g/dL — AB (ref 13.0–18.0)
MCH: 32.7 pg (ref 26.0–34.0)
MCHC: 34 g/dL (ref 32.0–36.0)
MCV: 96.4 fL (ref 80.0–100.0)
Platelets: 130 10*3/uL — ABNORMAL LOW (ref 150–440)
RBC: 3.41 MIL/uL — ABNORMAL LOW (ref 4.40–5.90)
RDW: 18.8 % — ABNORMAL HIGH (ref 11.5–14.5)
WBC: 7.8 10*3/uL (ref 3.8–10.6)

## 2015-08-06 LAB — HEPATIC FUNCTION PANEL
ALBUMIN: 3.6 g/dL (ref 3.5–5.0)
ALK PHOS: 109 U/L (ref 38–126)
ALT: 125 U/L — ABNORMAL HIGH (ref 17–63)
AST: 86 U/L — AB (ref 15–41)
Bilirubin, Direct: 0.6 mg/dL — ABNORMAL HIGH (ref 0.1–0.5)
Indirect Bilirubin: 0.7 mg/dL (ref 0.3–0.9)
TOTAL PROTEIN: 6.4 g/dL — AB (ref 6.5–8.1)
Total Bilirubin: 1.3 mg/dL — ABNORMAL HIGH (ref 0.3–1.2)

## 2015-08-06 LAB — TROPONIN I
Troponin I: 0.05 ng/mL (ref <0.03)
Troponin I: 0.04 ng/mL (ref <0.03)
Troponin I: 0.04 ng/mL (ref <0.03)

## 2015-08-06 MED ORDER — ONDANSETRON HCL 4 MG/2ML IJ SOLN: 4.0000 mg | Freq: Four times a day (QID) | INTRAMUSCULAR | Status: DC | PRN

## 2015-08-06 MED ORDER — FUROSEMIDE 10 MG/ML IJ SOLN
4.0000 mg/h | INTRAVENOUS | Status: DC
  Administered 2015-08-06 – 2015-08-08 (×2): 4 mg/h via INTRAVENOUS
  Filled 2015-08-06 (×2): qty 25

## 2015-08-06 MED ORDER — FUROSEMIDE 10 MG/ML IJ SOLN
40.0000 mg | INTRAMUSCULAR | Status: DC
  Filled 2015-08-06: qty 4

## 2015-08-06 MED ORDER — POTASSIUM CHLORIDE CRYS ER 20 MEQ PO TBCR
20.0000 meq | EXTENDED_RELEASE_TABLET | Freq: Every day | ORAL | Status: DC
  Administered 2015-08-06 – 2015-08-12 (×7): 20 meq via ORAL
  Filled 2015-08-06 (×7): qty 1

## 2015-08-06 MED ORDER — PANTOPRAZOLE SODIUM 40 MG PO TBEC
40.0000 mg | DELAYED_RELEASE_TABLET | Freq: Every day | ORAL | Status: DC
  Administered 2015-08-06 – 2015-08-18 (×13): 40 mg via ORAL
  Filled 2015-08-06 (×13): qty 1

## 2015-08-06 MED ORDER — LEFLUNOMIDE 20 MG PO TABS
20.0000 mg | ORAL_TABLET | Freq: Every day | ORAL | Status: DC
  Administered 2015-08-07 – 2015-08-18 (×10): 20 mg via ORAL
  Filled 2015-08-06 (×13): qty 1

## 2015-08-06 MED ORDER — ADULT MULTIVITAMIN W/MINERALS CH
ORAL_TABLET | Freq: Every day | ORAL | Status: DC
  Administered 2015-08-06 – 2015-08-18 (×13): 1 via ORAL
  Filled 2015-08-06 (×13): qty 1

## 2015-08-06 MED ORDER — ONDANSETRON HCL 4 MG PO TABS: 4.0000 mg | ORAL_TABLET | Freq: Four times a day (QID) | ORAL | Status: DC | PRN

## 2015-08-06 MED ORDER — ACETAMINOPHEN 650 MG RE SUPP
650.0000 mg | Freq: Four times a day (QID) | RECTAL | Status: DC | PRN
Start: 2015-08-06 — End: 2015-08-18

## 2015-08-06 MED ORDER — HEPARIN SODIUM (PORCINE) 5000 UNIT/ML IJ SOLN
5000.0000 [IU] | Freq: Three times a day (TID) | INTRAMUSCULAR | Status: DC
  Administered 2015-08-06 – 2015-08-10 (×11): 5000 [IU] via SUBCUTANEOUS
  Filled 2015-08-06 (×11): qty 1

## 2015-08-06 MED ORDER — IPRATROPIUM-ALBUTEROL 0.5-2.5 (3) MG/3ML IN SOLN
3.0000 mL | Freq: Four times a day (QID) | RESPIRATORY_TRACT | Status: DC | PRN
  Administered 2015-08-08 – 2015-08-15 (×3): 3 mL via RESPIRATORY_TRACT
  Filled 2015-08-06 (×3): qty 3

## 2015-08-06 MED ORDER — MOMETASONE FURO-FORMOTEROL FUM 200-5 MCG/ACT IN AERO
2.0000 | INHALATION_SPRAY | Freq: Two times a day (BID) | RESPIRATORY_TRACT | Status: DC
  Administered 2015-08-06 – 2015-08-18 (×25): 2 via RESPIRATORY_TRACT
  Filled 2015-08-06 (×2): qty 8.8

## 2015-08-06 MED ORDER — NITROGLYCERIN 0.4 MG SL SUBL: 0.4000 mg | SUBLINGUAL_TABLET | SUBLINGUAL | Status: DC | PRN

## 2015-08-06 MED ORDER — NITROGLYCERIN 0.4 MG/SPRAY TL SOLN: 1.0000 | Status: DC | PRN

## 2015-08-06 MED ORDER — ACETAMINOPHEN 325 MG PO TABS
650.0000 mg | ORAL_TABLET | Freq: Four times a day (QID) | ORAL | Status: DC | PRN
  Administered 2015-08-07 – 2015-08-17 (×11): 650 mg via ORAL
  Filled 2015-08-06 (×11): qty 2

## 2015-08-06 MED ORDER — SODIUM CHLORIDE 0.9 % IV SOLN
Freq: Once | INTRAVENOUS | Status: AC
  Administered 2015-08-06: 12:00:00 via INTRAVENOUS

## 2015-08-06 MED ORDER — SODIUM CHLORIDE 0.9% FLUSH
3.0000 mL | Freq: Two times a day (BID) | INTRAVENOUS | Status: DC
  Administered 2015-08-07 – 2015-08-18 (×17): 3 mL via INTRAVENOUS

## 2015-08-06 MED ORDER — HYDROXYCHLOROQUINE SULFATE 200 MG PO TABS
200.0000 mg | ORAL_TABLET | Freq: Two times a day (BID) | ORAL | Status: DC
  Administered 2015-08-06 – 2015-08-18 (×25): 200 mg via ORAL
  Filled 2015-08-06 (×27): qty 1

## 2015-08-06 MED ORDER — FOLIC ACID 1 MG PO TABS
1.0000 mg | ORAL_TABLET | Freq: Every day | ORAL | Status: DC
  Administered 2015-08-06 – 2015-08-18 (×13): 1 mg via ORAL
  Filled 2015-08-06 (×13): qty 1

## 2015-08-06 MED ORDER — PHENOL 1.4 % MT LIQD
1.0000 | OROMUCOSAL | Status: DC | PRN
  Administered 2015-08-07: 1 via OROMUCOSAL
  Filled 2015-08-06: qty 177

## 2015-08-06 MED ORDER — MONTELUKAST SODIUM 10 MG PO TABS
10.0000 mg | ORAL_TABLET | Freq: Every day | ORAL | Status: DC
  Administered 2015-08-06 – 2015-08-18 (×13): 10 mg via ORAL
  Filled 2015-08-06 (×13): qty 1

## 2015-08-06 MED ORDER — LEVOTHYROXINE SODIUM 75 MCG PO TABS
75.0000 ug | ORAL_TABLET | Freq: Every day | ORAL | Status: DC
  Administered 2015-08-07 – 2015-08-18 (×12): 75 ug via ORAL
  Filled 2015-08-06 (×12): qty 1

## 2015-08-06 MED ORDER — MIRTAZAPINE 15 MG PO TABS
7.5000 mg | ORAL_TABLET | Freq: Every day | ORAL | Status: DC
  Administered 2015-08-06 – 2015-08-17 (×12): 7.5 mg via ORAL
  Filled 2015-08-06 (×4): qty 1
  Filled 2015-08-06: qty 2
  Filled 2015-08-06 (×2): qty 1
  Filled 2015-08-06: qty 2
  Filled 2015-08-06 (×4): qty 1

## 2015-08-06 MED ORDER — POLYETHYLENE GLYCOL 3350 17 G PO PACK: 17.0000 g | PACK | Freq: Every day | ORAL | Status: DC | PRN

## 2015-08-06 MED ORDER — CARVEDILOL 3.125 MG PO TABS
3.1250 mg | ORAL_TABLET | Freq: Two times a day (BID) | ORAL | Status: DC
  Administered 2015-08-08 – 2015-08-11 (×6): 3.125 mg via ORAL
  Filled 2015-08-06 (×8): qty 1

## 2015-08-06 MED ORDER — FUROSEMIDE 10 MG/ML IJ SOLN: 40.0000 mg | Freq: Two times a day (BID) | INTRAMUSCULAR | Status: DC

## 2015-08-06 MED ORDER — SIMVASTATIN 40 MG PO TABS
40.0000 mg | ORAL_TABLET | Freq: Every day | ORAL | Status: DC
  Administered 2015-08-06 – 2015-08-17 (×12): 40 mg via ORAL
  Filled 2015-08-06: qty 1
  Filled 2015-08-06: qty 2
  Filled 2015-08-06 (×2): qty 1
  Filled 2015-08-06: qty 2
  Filled 2015-08-06: qty 1
  Filled 2015-08-06: qty 2
  Filled 2015-08-06: qty 1
  Filled 2015-08-06 (×2): qty 2
  Filled 2015-08-06 (×2): qty 1

## 2015-08-06 NOTE — Assessment & Plan Note (Addendum)
Patient presents with worsening dyspnea. Patient has no core morbidities including end-stage COPD, cardiomyopathy/CHF. Both of these entities are likely contributing to his worsening dyspnea. Patient visibly dyspneic and appears ill. Given comorbidities and presentation, patient in need of urgent workup and evaluation. Will likely be admitted to the hospital. Sent to the ED via ambulance.

## 2015-08-06 NOTE — Consult Note (Signed)
Cardiology Consultation Note  Patient ID: Tom Nguyen, MRN: [id number], DOB/AGE: [id number] 80 y.o. Admit date: 08/06/2015   Date of Consult: 08/06/2015 Primary Physician: [name], DO Primary Cardiologist: Dossie Arbour  Chief Complaint: Shortness of breath Reason for Consult: Acute on chronic systolic and diastolic CHF Consult placed by Dr. Nemiah Commander  HPI: 82 y.o. male with long history of smoking starting at age 54, stopped in 1995, coronary artery disease, bypass surgery in 1995, ischemic cardiomyopathy, sleep apnea with CPAP, COPD, ICD 3 leads, underlying left bundle branch block with ejection fraction 28% (up to 40% on select echos) who presents with worsening shortness of breath Most recent hospitalization March 2017 for COPD exacerbation, non-STEMI, respiratory failure (he required nebulizers, steroids, Levaquin, IV Lasix)  He reports having worsening shortness of breath on exertion, night cough with yellow phlegm periodically, Saw his primary care physician today and was sent to the emergency room as he was visibly short of breath Weight typically 165 pounds, though notes indicating weight up to 170 today He does report worsening ankle edema over the past several weeks He has been compliant with Lasix 80 mg daily,  Lab work on arrival with creatinine up to 1.99, Baseline creatinine 1.2 with BUN 26 BNP 2000 (was greater than 4500 months ago) Reports not drinking much in the past week or 2, having chronic diarrhea 2-3 episodes daily Hospitalist has started Lasix IV twice a day Echocardiogram has been ordered  Last seen in clinic December 2016, did not complete a stress test as he developed bronchitis, COPD exacerbation. In the office we had to give him prednisone taper for continued symptoms, he declined stress testing  Other past medical history  increasing shortness of breath since the end of August 2016, beginning of September 2016.  He has a Audiological scientist. Notes provided shows ejection fraction by echocardiogram October 2010 was 35%. Ejection fraction in 2007 was 40% Stress test July 2006 showed inferior posterior lateral infarction with peri-infarct ischemia, ejection fraction 28%. Akinesis of the inferior myocardial segment and hypokinesis of the inferolateral segment. Prior study July 2004, no change  Notes indicate creatinine 1.65 in November 2015 with GFR 39 BNP 07/28/2014 was 200 EKG on today's visit shows a sense, V pace at 70 bpm  He does report that he was previously on warfarin. He does not take aspirin Family is unaware why he was on the warfarin in the past. They deny any atrial fibrillation or other arrhythmia. He had significant bruising, bleeding. Warfarin was held He reports he has not been on aspirin for a long time but does not know why. Possibly from bleeding. Notes also indicate GI bleeding dates back to 2011. Perhaps this is why his anticoagulation was held     Past Medical History  Diagnosis Date  . Asthma   . Arthritis   . History of GI bleed   . Emphysema of lung (HCC)   . Hypertension   . Hyperlipidemia   . Hypothyroid   . Ischemic cardiomyopathy   . Chronic combined systolic and diastolic CHF, NYHA class 3 (HCC)     a. 2007 EF 40%; b. 10/2008: EF 35% by echo; c. echo 10/16: EF 45-50%, sev inf/post wall HK, conduction abnormality, mild MR, mod dilated LA, PASP 47 mm Hg  . LBBB (left bundle branch block)   . COPD (chronic obstructive pulmonary disease) (HCC)     on 2L o2 at home  . Sleep apnea     a. on  CPAP  . MI (myocardial infarction) (HCC) 1995  . CAD (coronary artery disease)     a. s/p CABG 1995; b. stress test 2006: inf post lat infarction w/ peri-infarct ischemia, AK inferior wall & HK inferolat wall, EF 28%. unchanged from prior study 07/2002.  Marland Kitchen Chronic respiratory failure (HCC)   . Chronic diarrhea       Most Recent Cardiac Studies: Echocardiogram pending    Surgical History:  Past  Surgical History  Procedure Laterality Date  . Coronary artery bypass graft  1995    5 vessels  . Pacemaker insertion  2002  . Reverse shoulder arthroplasty Right 2013  . Reverse total shoulder arthroplasty Left 2014  . Total knee arthroplasty  2009    both knees  . Cardiac catheterization    . Insert / replace / remove pacemaker      Research officer, political party   . Cataract extraction       Home Meds: Prior to Admission medications   Medication Sig Start Date End Date Taking? Authorizing Provider  acetaminophen (TYLENOL) 500 MG tablet Take 500 mg by mouth every 6 (six) hours as needed.    Historical Provider, MD  albuterol (PROVENTIL HFA;VENTOLIN HFA) 108 (90 Base) MCG/ACT inhaler Inhale 2 puffs into the lungs every 4 (four) hours as needed for wheezing or shortness of breath. 03/05/15   Erin Fulling, MD  albuterol (PROVENTIL) (2.5 MG/3ML) 0.083% nebulizer solution Take 3 mLs (2.5 mg total) by nebulization every 4 (four) hours. For wheezing/SOB 03/05/15   Erin Fulling, MD  alendronate (FOSAMAX) 70 MG tablet Take 70 mg by mouth once a week.  04/08/15   Historical Provider, MD  allopurinol (ZYLOPRIM) 100 MG tablet Take 100 mg by mouth daily.  08/25/14   Historical Provider, MD  carvedilol (COREG) 12.5 MG tablet Take 1 tablet (12.5 mg total) by mouth 2 (two) times daily with a meal. 06/17/15   [name], DO  eplerenone (INSPRA) 50 MG tablet Take 50 mg by mouth daily.  08/12/14   Historical Provider, MD  folic acid (FOLVITE) 1 MG tablet Take 1 mg by mouth daily.  08/25/14   Historical Provider, MD  furosemide (LASIX) 80 MG tablet Take 80 mg by mouth daily.  09/17/14   Historical Provider, MD  hydroxychloroquine (PLAQUENIL) 200 MG tablet Take 200 mg by mouth 2 (two) times daily.  09/26/14   Historical Provider, MD  leflunomide (ARAVA) 20 MG tablet Take 20 mg by mouth daily.  08/25/14   Historical Provider, MD  levothyroxine (SYNTHROID, LEVOTHROID) 75 MCG tablet Take 1 tablet (75 mcg total) by mouth daily  before breakfast. 06/12/15   [name], DO  lisinopril (PRINIVIL,ZESTRIL) 20 MG tablet Take 1 tablet (20 mg total) by mouth daily. 06/17/15   [name], DO  mirtazapine (REMERON) 7.5 MG tablet TAKE 1 TABLET(7.5 MG) BY MOUTH AT BEDTIME 05/18/15   [name], DO  mirtazapine (REMERON) 7.5 MG tablet TAKE 1 TABLET(7.5 MG) BY MOUTH AT BEDTIME 07/17/15   Jayce G Cook, DO  montelukast (SINGULAIR) 10 MG tablet Take 10 mg by mouth daily.  01/25/15   Historical Provider, MD  Multiple Vitamins-Minerals (MULTIVITAMIN ADULT PO) Take by mouth daily.     Historical Provider, MD  nitroGLYCERIN (NITROLINGUAL) 0.4 MG/SPRAY spray Place 1 spray under the tongue every 5 (five) minutes x 3 doses as needed for chest pain.    Historical Provider, MD  omeprazole (PRILOSEC) 20 MG capsule Take 1 capsule (20 mg total)  by mouth daily. 06/17/15   [name], DO  Potassium Chloride ER 20 MEQ TBCR Take 20 mEq by mouth daily. 06/17/15   [name], DO  simvastatin (ZOCOR) 40 MG tablet Take 1 tablet (40 mg total) by mouth daily. 07/01/15   [name], DO  traMADol (ULTRAM) 50 MG tablet Take 1 tablet (50 mg total) by mouth 2 (two) times daily. X 7 days 04/16/15   Enid Baas, MD  Umeclidinium-Vilanterol [facility] ELLIPTA) 62.5-25 MCG/INH AEPB Inhale 1 puff into the lungs daily. 12/04/14   Merwyn Katos, MD    Inpatient Medications:  . carvedilol  3.125 mg Oral BID WC  . folic acid  1 mg Oral Daily  . heparin  5,000 Units Subcutaneous Q8H  . hydroxychloroquine  200 mg Oral BID  . leflunomide  20 mg Oral Daily  . [START ON 08/07/2015] levothyroxine  75 mcg Oral QAC breakfast  . mirtazapine  7.5 mg Oral QHS  . mometasone-formoterol  2 puff Inhalation BID  . montelukast  10 mg Oral Daily  . multivitamin with minerals   Oral Daily  . pantoprazole  40 mg Oral Daily  . potassium chloride  20 mEq Oral Daily  . simvastatin  40 mg Oral Daily  . sodium chloride flush  3 mL Intravenous Q12H   . furosemide (LASIX) infusion        Allergies: No Known Allergies  Social History   Social History  . Marital Status: Married    Spouse Name: N/A  . Number of Children: N/A  . Years of Education: N/A   Occupational History  . Not on file.   Social History Main Topics  . Smoking status: Former Smoker    Quit date: 02/15/1993  . Smokeless tobacco: Never Used     Comment: Quit in 1995  . Alcohol Use: 0.0 - 0.6 oz/week    0-1 Standard drinks or equivalent per week     Comment: occasional  . Drug Use: No  . Sexual Activity: Not on file   Other Topics Concern  . Not on file   Social History Narrative   Lives at home with wife, has both cane and a walker.   Mobility limited due to breathing/dyspnea     Family History  Problem Relation Age of Onset  . Hyperlipidemia Father   . Stroke Father   . Hypertension Father   . Kidney disease Father   . Kidney failure Mother     Was on dialysis     Review of Systems: Review of Systems  Constitutional: Positive for malaise/fatigue.  Respiratory: Positive for cough and shortness of breath.   Cardiovascular: Positive for leg swelling.  Gastrointestinal: Negative.   Musculoskeletal: Negative.   Neurological: Positive for weakness.  Psychiatric/Behavioral: Negative.   All other systems reviewed and are negative.   Labs:  Recent Labs  08/06/15 1031 08/06/15 1655  TROPONINI 0.05* 0.04*   Lab Results  Component Value Date   WBC 7.8 08/06/2015   HGB 11.2* 08/06/2015   HCT 32.9* 08/06/2015   MCV 96.4 08/06/2015   PLT 130* 08/06/2015    Recent Labs Lab 08/06/15 1031  NA 138  K 4.1  CL 102  CO2 26  BUN 40*  CREATININE 1.99*  CALCIUM 8.4*  PROT 6.4*  BILITOT 1.3*  ALKPHOS 109  ALT 125*  AST 86*  GLUCOSE 103*   Lab Results  Component Value Date   CHOL 91 10/20/2014   HDL 40.00 10/20/2014  LDLCALC 28 10/20/2014   TRIG 112.0 10/20/2014   No results found for: DDIMER  Radiology/Studies:  Dg Chest Portable 1 View  08/06/2015   CLINICAL DATA:  Shortness of breath for the last week, worsening today. EXAM: PORTABLE CHEST 1 VIEW COMPARISON:  04/14/2015 FINDINGS: Pacemaker defibrillator remains in place. Previous median sternotomy. Chronic cardiomegaly. Chronic aortic atherosclerosis. Small amount of pleural fluid. Venous hypertension with question early interstitial edema. IMPRESSION: Cardiomegaly. Question venous hypertension with early interstitial edema. Electronically Signed   By: Paulina Fusi M.D.   On: 08/06/2015 12:56    EKG: EKG showing normal sinus rhythm with left bundle branch block  Weights: Filed Weights   08/06/15 1435  Weight: 166 lb (75.297 kg)     Physical Exam: Blood pressure 99/53, pulse 71, temperature 98.1 F (36.7 C), resp. rate 22, weight 166 lb (75.297 kg), SpO2 96 %. Body mass index is 25.25 kg/(m^2). General: Well developed, wAppears short of breath, tachypnea Head: Normocephalic, atraumatic, sclera non-icteric, no xanthomas, nares are without discharge.  Neck: Negative for carotid bruits. JVD 10 cm Lungs: Clear bilaterally to auscultation without wheezes, scant rails at bases Breathing is labored. Heart: RRR with S1 S2. No murmurs, rubs, or gallops appreciated. Abdomen: Soft, non-tender, non-distended with normoactive bowel sounds. No hepatomegaly. No rebound/guarding. No obvious abdominal masses. Msk:  Strength and tone appear normal for age. Extremities: No clubbing or cyanosis. No edema.  Distal pedal pulses are 2+ and equal bilaterally. Neuro: Alert and oriented X 3. No facial asymmetry. No focal deficit. Moves all extremities spontaneously. Psych:  Responds to questions appropriately with a normal affect.    Assessment and Plan:   1. Acute respiratory distress Mild weight gain of 5 pounds, (has not been eating much at home given diarrhea so weight gain from fluid may be more than estimated) Echocardiogram showing depressed ejection fraction down to 25%, previously 40% Elevated  right heart pressures, moderately high Picture suggestive of acute on chronic systolic CHF Seen by nephrology, started on insulin infusion  Unable to exclude ischemia -  he does have severe underlying COPD. Does not appear to have bronchitis symptoms If no improvement in symptoms with diuresis, may benefit from prednisone taper. He has required present taper on numerous other occasions though typically in the setting of acute bronchitis  2) ischemic cardiopathy Drop in ejection fraction compared to previous echocardiogram Unable to exclude ischemia Currently not in a position to do cardiac catheterization given inability to lay flat, renal failure Would consider ischemia workup early next week if renal function improves, could consider catheterization to evaluate grafts  3) cad/cabg Drop in ejection fraction, heart failure symptoms Unable to exclude ischemia, Initial cardiac enzymes negative 2 Consider catheterization early next week only if renal function dramatically improves  4) acute on chronic systolic CHF Elevated BNP 2000, though interestingly was greater than 4500 back in March Possible cardiorenal syndrome in the setting of severely depressed ejection fraction Started on Lasix infusion, would monitor renal function closely  5) diarrhea Etiology unclear, has been happening for at least 2 weeks, possibly more Having accidents, anorexia, weight loss  Long discussion with family, answered all questions Echocardiogram discussed with family, Discussed case with Dr. Thedore Mins    Total encounter time more than 110 minutes  Greater than 50% was spent in counseling and coordination of care with the patient   Signed, Dossie Arbour, MD. Ph.D [facility] 08/06/2015, 6:12 PM

## 2015-08-06 NOTE — H&P (Signed)
[facility] [place] at [facility]   PATIENT NAME: Tom Nguyen    MR#:  [id number]  DATE OF BIRTH:  [birth year]  DATE OF ADMISSION:  08/06/2015  PRIMARY CARE PHYSICIAN: [name], DO   REQUESTING/REFERRING PHYSICIAN: Dr. Dorothea Glassman  CHIEF COMPLAINT:   Chief Complaint  Patient presents with  . Shortness of Breath    HISTORY OF PRESENT ILLNESS:  Tom Nguyen  is a 82 y.o. male with a known history of CAD status post CABG, ischemic cardiomyopathy with ejection fraction of 45% from 2016, severe emphysema on 2 L home oxygen, chronic diarrhea, hypothyroidism, known left bundle branch block, sleep apnea presents to the hospital secondary to worsening shortness of breath. Patient has been seeing his pulmonologist and cardiologist for acute on chronic dyspnea for the last few months. He has been on prednisone tapers and antibiotics. He says for the last 1 week his dyspnea has gotten so much worse, he can lay flat or sitting still without feeling short of breath. But as soon as he starts moving around he gets acutely dyspneic. Denies any chest pain. No fevers or chills. No nausea or vomiting. Complains of night cough with yellowish phlegm occasionally. He went to see his PCP today for the same concern and was sent to the ER as he was visibly dyspneic. He says his weight usually is around 165 pounds, but now it is currently at 170 pounds. His ankle edema has been worsening over the last several weeks. He denies any increased intake of fluids or salt at home. He is currently on 80 mg oral dose of Lasix. Labs also indicate acute renal failure. He complains of diarrhea when he came in, however according to gastroenterology notes from last year, he does have chronic diarrhea with 2-3 loose stools with some fecal incontinence. Patient denies any worsening of those episodes.  PAST MEDICAL HISTORY:   Past Medical History  Diagnosis Date  . Asthma   . Arthritis   . Blood in  stool   . Emphysema of lung (HCC)   . Hypertension   . Hyperlipidemia   . Heart disease   . Thyroid disease   . Ischemic cardiomyopathy   . CHF (congestive heart failure) (HCC)     systolic CHF, EF 74%  . LBBB (left bundle branch block)   . COPD (chronic obstructive pulmonary disease) (HCC)     on 2L o2 at home  . Sleep apnea   . MI (myocardial infarction) (HCC) 1995  . CAD (coronary artery disease)     s/p CABG    PAST SURGICAL HISTORY:   Past Surgical History  Procedure Laterality Date  . Coronary artery bypass graft  1995    5 vessels  . Pacemaker insertion  2002  . Reverse shoulder arthroplasty Right 2013  . Reverse total shoulder arthroplasty Left 2014  . Total knee arthroplasty  2009    both knees  . Cardiac catheterization    . Insert / replace / remove pacemaker      [employer], political [employer]   . Cataract extraction      SOCIAL HISTORY:   Social History  Substance Use Topics  . Smoking status: Former Smoker    Quit date: 02/15/1993  . Smokeless tobacco: Never Used     Comment: Quit in 1995  . Alcohol Use: 0.0 - 0.6 oz/week    0-1 Standard drinks or equivalent per week     Comment: occasional    FAMILY  HISTORY:   Family History  Problem Relation Age of Onset  . Hyperlipidemia Father   . Stroke Father   . Hypertension Father   . Kidney disease Father   . Kidney failure Mother     Was on dialysis    DRUG ALLERGIES:  No Known Allergies  REVIEW OF SYSTEMS:   Review of Systems  Constitutional: Positive for chills and malaise/fatigue. Negative for fever and weight loss.  HENT: Negative for ear discharge, ear pain, nosebleeds and tinnitus.   Eyes: Negative for blurred vision, double vision and photophobia.  Respiratory: Positive for cough and shortness of breath. Negative for hemoptysis and wheezing.   Cardiovascular: Positive for leg swelling. Negative for chest pain, palpitations and orthopnea.  Gastrointestinal: Negative for heartburn, nausea,  vomiting, abdominal pain, diarrhea, constipation and melena.  Genitourinary: Negative for dysuria, urgency and frequency.       Urinary hesitance noted.  Musculoskeletal: Negative for myalgias, back pain and neck pain.  Skin: Negative for rash.  Neurological: Positive for dizziness and weakness. Negative for tingling, tremors, sensory change, speech change, focal weakness and headaches.  Endo/Heme/Allergies: Does not bruise/bleed easily.  Psychiatric/Behavioral: Negative for depression.    MEDICATIONS AT HOME:   Prior to Admission medications   Medication Sig Start Date End Date Taking? Authorizing Provider  acetaminophen (TYLENOL) 500 MG tablet Take 500 mg by mouth every 6 (six) hours as needed.    Historical Provider, MD  albuterol (PROVENTIL HFA;VENTOLIN HFA) 108 (90 Base) MCG/ACT inhaler Inhale 2 puffs into the lungs every 4 (four) hours as needed for wheezing or shortness of breath. 03/05/15   Erin Fulling, MD  albuterol (PROVENTIL) (2.5 MG/3ML) 0.083% nebulizer solution Take 3 mLs (2.5 mg total) by nebulization every 4 (four) hours. For wheezing/SOB 03/05/15   Erin Fulling, MD  alendronate (FOSAMAX) 70 MG tablet Take 70 mg by mouth once a week.  04/08/15   Historical Provider, MD  allopurinol (ZYLOPRIM) 100 MG tablet Take 100 mg by mouth daily.  08/25/14   Historical Provider, MD  carvedilol (COREG) 12.5 MG tablet Take 1 tablet (12.5 mg total) by mouth 2 (two) times daily with a meal. 06/17/15   [name], DO  eplerenone (INSPRA) 50 MG tablet Take 50 mg by mouth daily.  08/12/14   Historical Provider, MD  folic acid (FOLVITE) 1 MG tablet Take 1 mg by mouth daily.  08/25/14   Historical Provider, MD  furosemide (LASIX) 80 MG tablet Take 80 mg by mouth daily.  09/17/14   Historical Provider, MD  hydroxychloroquine (PLAQUENIL) 200 MG tablet Take 200 mg by mouth 2 (two) times daily.  09/26/14   Historical Provider, MD  leflunomide (ARAVA) 20 MG tablet Take 20 mg by mouth daily.  08/25/14   Historical  Provider, MD  levothyroxine (SYNTHROID, LEVOTHROID) 75 MCG tablet Take 1 tablet (75 mcg total) by mouth daily before breakfast. 06/12/15   [name], DO  lisinopril (PRINIVIL,ZESTRIL) 20 MG tablet Take 1 tablet (20 mg total) by mouth daily. 06/17/15   [name], DO  mirtazapine (REMERON) 7.5 MG tablet TAKE 1 TABLET(7.5 MG) BY MOUTH AT BEDTIME 05/18/15   [name], DO  mirtazapine (REMERON) 7.5 MG tablet TAKE 1 TABLET(7.5 MG) BY MOUTH AT BEDTIME 07/17/15   Jayce G Cook, DO  montelukast (SINGULAIR) 10 MG tablet Take 10 mg by mouth daily.  01/25/15   Historical Provider, MD  Multiple Vitamins-Minerals (MULTIVITAMIN ADULT PO) Take by mouth daily.     Historical  Provider, MD  nitroGLYCERIN (NITROLINGUAL) 0.4 MG/SPRAY spray Place 1 spray under the tongue every 5 (five) minutes x 3 doses as needed for chest pain.    Historical Provider, MD  omeprazole (PRILOSEC) 20 MG capsule Take 1 capsule (20 mg total) by mouth daily. 06/17/15   [name], DO  Potassium Chloride ER 20 MEQ TBCR Take 20 mEq by mouth daily. 06/17/15   [name], DO  simvastatin (ZOCOR) 40 MG tablet Take 1 tablet (40 mg total) by mouth daily. 07/01/15   [name], DO  traMADol (ULTRAM) 50 MG tablet Take 1 tablet (50 mg total) by mouth 2 (two) times daily. X 7 days 04/16/15   Enid Baas, MD  Umeclidinium-Vilanterol [facility] ELLIPTA) 62.5-25 MCG/INH AEPB Inhale 1 puff into the lungs daily. 12/04/14   Merwyn Katos, MD      VITAL SIGNS:  Blood pressure 109/71, pulse 71, resp. rate 22, SpO2 100 %.  PHYSICAL EXAMINATION:   Physical Exam  GENERAL:  82 y.o.-year-old elderly patient lying in the bed with no acute distress.  EYES: Pupils equal, round, reactive to light and accommodation. No scleral icterus. Extraocular muscles intact. Pale appearing HEENT: Head atraumatic, normocephalic. Oropharynx and nasopharynx clear.  NECK:  Supple, no jugular venous distention. No thyroid enlargement, no tenderness.  LUNGS: Appears  dyspneic,  Moving air bilaterally, no wheezing,rhonchi or crepitation. Fine bibasilar crackles present. No use of accessory muscles of respiration.  CARDIOVASCULAR: S1, S2 normal. No rubs, or gallops. Soft systolic murmur heard ABDOMEN: Soft, nontender, nondistended. Bowel sounds present. No organomegaly or mass.  EXTREMITIES: No cyanosis, or clubbing. 2+ ankle edema present NEUROLOGIC: Cranial nerves II through XII are intact. Muscle strength 5/5 in all extremities. Sensation intact. Gait not checked. Global weakness noted.  PSYCHIATRIC: The patient is alert and oriented x 3.  SKIN: No obvious rash, lesion, or ulcer.   LABORATORY PANEL:   CBC  Recent Labs Lab 08/06/15 1031  WBC 7.8  HGB 11.2*  HCT 32.9*  PLT 130*   ------------------------------------------------------------------------------------------------------------------  Chemistries   Recent Labs Lab 08/06/15 1031  NA 138  K 4.1  CL 102  CO2 26  GLUCOSE 103*  BUN 40*  CREATININE 1.99*  CALCIUM 8.4*  AST 86*  ALT 125*  ALKPHOS 109  BILITOT 1.3*   ------------------------------------------------------------------------------------------------------------------  Cardiac Enzymes  Recent Labs Lab 08/06/15 1031  TROPONINI 0.05*   ------------------------------------------------------------------------------------------------------------------  RADIOLOGY:  Dg Chest Portable 1 View  08/06/2015  CLINICAL DATA:  Shortness of breath for the last week, worsening today. EXAM: PORTABLE CHEST 1 VIEW COMPARISON:  04/14/2015 FINDINGS: Pacemaker defibrillator remains in place. Previous median sternotomy. Chronic cardiomegaly. Chronic aortic atherosclerosis. Small amount of pleural fluid. Venous hypertension with question early interstitial edema. IMPRESSION: Cardiomegaly. Question venous hypertension with early interstitial edema. Electronically Signed   By: Paulina Fusi M.D.   On: 08/06/2015 12:56    EKG:   Orders  placed or performed during the hospital encounter of 08/06/15  . ED EKG  . ED EKG    IMPRESSION AND PLAN:   Finnick Perrilloux  is a 82 y.o. male with a known history of CAD status post CABG, ischemic cardiomyopathy with ejection fraction of 45% from 2016, severe emphysema on 2 L home oxygen, chronic diarrhea, hypothyroidism, known left bundle branch block, sleep apnea presents to the hospital secondary to worsening shortness of breath.  #1 acute on chronic systolic CHF exacerbation-presenting as worsening dyspnea. -Admit to telemetry, recycle troponins. The initial elevation of troponin  seems to be demand ischemia. -Started Lasix IV twice a day. -Repeat echocardiogram. Cardiology consult for the same. -Daily weights and strict intake and output. -CHF vest readings  #2 acute renal failure-has CK D stage II at baseline. Creatinine baseline is between 1 to1.2  -Creatinine at 1.9 with BUN of 40. -On exam does not appear to be dehydrated. Could be cardiorenal syndrome from his CHF. -Start IV Lasix, discontinue IV fluids -Renal ultrasound and nephrology consult  #3 chronic diarrhea-following up with GI as outpatient. Main concern is fecal incontinence and 2-3 loose stools per day. -Continue to monitor at this time. -Use antidiarrheals for the same  #4 CAD status post CABG-follow-up echo and cardiology consult. -Denies any chest pain. Continue cardiac medications if blood pressure allows. Patient on Coreg, lisinopril, statin. -Not on aspirin due to recent rectal bleeds  #5 COPD with severe emphysema-on 2 L home oxygen chronically. -Follows up as an outpatient with Dr. Darrol Angel. -Continue home inhalers. Duo nebs as needed.  #6 GERD-on PPI  #7 DVT prophylaxis-subcutaneous heparin  Physical therapy consult requested. -Once family is present, consider CODE STATUS discussion    All the records are reviewed and case discussed with ED provider. Management plans discussed with the patient,  family and they are in agreement.  CODE STATUS: Full Code  TOTAL TIME TAKING CARE OF THIS PATIENT: 50  minutes.    Bellina Tokarczyk M.D on 08/06/2015 at 1:43 PM  Between 7am to 6pm - Pager - [phone]  After 6pm go to [email or web address] - password [email or web address]  [facility]  Office  [phone]  CC: Primary care physician; [name], DO

## 2015-08-06 NOTE — Progress Notes (Signed)
   Subjective:  Patient ID: Tom Nguyen, male    DOB: 03/30/1932  Age: 82 y.o. MRN: [id number]  CC: SOB  HPI:  82 year old male with a complicated past medical history including advanced COPD, CKD, ischemic cardiomyopathy, CAD, HTN, HLD presents for an acute visit with the above complaint.  Patient reports increasing shortness of breath over the last week. He reports he's had a mild cough as well which has been slightly productive of discolored sputum. No associated fevers or chills. Shortness breath worse with activity. No reports of chest pain currently. No known relieving factors. He endorses compliance with his home medications. No other complaints this time.  Social Hx   Social History   Social History  . Marital Status: Married    Spouse Name: N/A  . Number of Children: N/A  . Years of Education: N/A   Social History Main Topics  . Smoking status: Former Smoker    Quit date: 02/15/1993  . Smokeless tobacco: Never Used  . Alcohol Use: 0.0 - 0.6 oz/week    0-1 Standard drinks or equivalent per week  . Drug Use: No  . Sexual Activity: Not Asked   Other Topics Concern  . None   Social History Narrative   Review of Systems  Constitutional: Positive for fatigue.  Respiratory: Positive for shortness of breath.    Objective:  BP 98/70 mmHg  Pulse 79  Wt 170 lb (77.111 kg)  SpO2 98%  BP/Weight 08/06/2015 08/06/2015 06/17/2015  Systolic BP 92 98 106  Diastolic BP 56 70 62  Wt. (Lbs) - 170 163.19  BMI - 25.85 24.82   Physical Exam  Constitutional: He is oriented to person, place, and time.  Chronically ill-appearing male. Visibly dyspneic. Nasal cannula O2 in place.  HENT:  Head: Normocephalic and atraumatic.  Eyes: No scleral icterus.  Cardiovascular: Normal rate and regular rhythm.   1+ pitting lower extremity edema.  Pulmonary/Chest:  Increased work of breathing. Bibasilar crackles and decreased breath sounds throughout.  Neurological: He is alert and oriented  to person, place, and time.  Skin:  Pale.  Psychiatric:  Flat affect/depressed mood.  Vitals reviewed.  Lab Results  Component Value Date   WBC 7.8 08/06/2015   HGB 11.2* 08/06/2015   HCT 32.9* 08/06/2015   PLT 130* 08/06/2015   GLUCOSE 103* 08/06/2015   CHOL 91 10/20/2014   TRIG 112.0 10/20/2014   HDL 40.00 10/20/2014   LDLCALC 28 10/20/2014   ALT 125* 08/06/2015   AST 86* 08/06/2015   NA 138 08/06/2015   K 4.1 08/06/2015   CL 102 08/06/2015   CREATININE 1.99* 08/06/2015   BUN 40* 08/06/2015   CO2 26 08/06/2015   HGBA1C 5.5 10/20/2014   Assessment & Plan:   Problem List Items Addressed This Visit    Dyspnea - Primary    Patient presents with worsening dyspnea. Patient has no core morbidities including end-stage COPD, cardiomyopathy/CHF. Both of these entities are likely contributing to his worsening dyspnea. Patient visibly dyspneic and appears ill. Given comorbidities and presentation, patient in need of urgent workup and evaluation. Will likely be admitted to the hospital. Sent to the ED via ambulance.        Follow-up: PRN  Everlene Other DO [facility]

## 2015-08-06 NOTE — Consult Note (Signed)
Date: 08/06/2015                  Patient Name:  Tom Nguyen  MRN: [id number]  DOB: [birth year]  Age / Sex: 82 y.o., male         PCP: [name], DO                 Service Requesting Consult: Internal Medicine                 Reason for Consult: ARF            History of Present Illness: Patient is a 82 y.o. male with medical problems of COPD requiring home oxygen, rheumatoid arthritis, hypertension, severe coronary disease with five-vessel CABG in 1995, congestive heart failure with EF 35-40%, pacemaker, AICD, history of goiter, history of renal stones in the remote past, who was admitted to [facility] on 08/06/2015 for evaluation of shortness of breath that started about a week ago. Prior to that, he was mildly short of breath with exertion. He uses home oxygen at baseline for COPD. Normally, he is able to walk with a walker and cane. Patient also reports diarrhea with multiple stools that has been going on for a week. He has poor appetite which has been worsening over the week. No blood in the stool. No blood in the urine. He doesn't have lower extremity edema.   Baseline creatinine appears to be 1.09/216 from March 2017 Admission creatinine is 1.99    Medications: Outpatient medications: Prescriptions prior to admission  Medication Sig Dispense Refill Last Dose  . acetaminophen (TYLENOL) 500 MG tablet Take 500 mg by mouth every 6 (six) hours as needed.   Taking  . albuterol (PROVENTIL HFA;VENTOLIN HFA) 108 (90 Base) MCG/ACT inhaler Inhale 2 puffs into the lungs every 4 (four) hours as needed for wheezing or shortness of breath. 1 Inhaler 6 Taking  . albuterol (PROVENTIL) (2.5 MG/3ML) 0.083% nebulizer solution Take 3 mLs (2.5 mg total) by nebulization every 4 (four) hours. For wheezing/SOB 120 mL 12 Taking  . alendronate (FOSAMAX) 70 MG tablet Take 70 mg by mouth once a week.    Taking  . allopurinol (ZYLOPRIM) 100 MG tablet Take 100 mg by mouth daily.    Taking  . carvedilol (COREG)  12.5 MG tablet Take 1 tablet (12.5 mg total) by mouth 2 (two) times daily with a meal. 180 tablet 0 Taking  . eplerenone (INSPRA) 50 MG tablet Take 50 mg by mouth daily.    Taking  . folic acid (FOLVITE) 1 MG tablet Take 1 mg by mouth daily.    Taking  . furosemide (LASIX) 80 MG tablet Take 80 mg by mouth daily.    Taking  . hydroxychloroquine (PLAQUENIL) 200 MG tablet Take 200 mg by mouth 2 (two) times daily.    Taking  . leflunomide (ARAVA) 20 MG tablet Take 20 mg by mouth daily.    Taking  . levothyroxine (SYNTHROID, LEVOTHROID) 75 MCG tablet Take 1 tablet (75 mcg total) by mouth daily before breakfast. 90 tablet 2 Taking  . lisinopril (PRINIVIL,ZESTRIL) 20 MG tablet Take 1 tablet (20 mg total) by mouth daily. 90 tablet 0 Taking  . mirtazapine (REMERON) 7.5 MG tablet TAKE 1 TABLET(7.5 MG) BY MOUTH AT BEDTIME 90 tablet 3 Taking  . mirtazapine (REMERON) 7.5 MG tablet TAKE 1 TABLET(7.5 MG) BY MOUTH AT BEDTIME 90 tablet 0 Taking  . montelukast (SINGULAIR) 10 MG tablet Take 10 mg by mouth  daily.    Taking  . Multiple Vitamins-Minerals (MULTIVITAMIN ADULT PO) Take by mouth daily.    Taking  . nitroGLYCERIN (NITROLINGUAL) 0.4 MG/SPRAY spray Place 1 spray under the tongue every 5 (five) minutes x 3 doses as needed for chest pain.   Taking  . omeprazole (PRILOSEC) 20 MG capsule Take 1 capsule (20 mg total) by mouth daily. 90 capsule 0 Taking  . Potassium Chloride ER 20 MEQ TBCR Take 20 mEq by mouth daily. 90 tablet 0 Taking  . simvastatin (ZOCOR) 40 MG tablet Take 1 tablet (40 mg total) by mouth daily. 90 tablet 2 Taking  . traMADol (ULTRAM) 50 MG tablet Take 1 tablet (50 mg total) by mouth 2 (two) times daily. X 7 days 15 tablet 0 Taking  . Umeclidinium-Vilanterol (ANORO ELLIPTA) 62.5-25 MCG/INH AEPB Inhale 1 puff into the lungs daily. 180 each 3 Taking    Current medications: Current Facility-Administered Medications  Medication Dose Route Frequency Provider Last Rate Last Dose  . acetaminophen  (TYLENOL) tablet 650 mg  650 mg Oral Q6H PRN Enid Baas, MD       Or  . acetaminophen (TYLENOL) suppository 650 mg  650 mg Rectal Q6H PRN Enid Baas, MD      . carvedilol (COREG) tablet 3.125 mg  3.125 mg Oral BID WC Enid Baas, MD      . folic acid (FOLVITE) tablet 1 mg  1 mg Oral Daily Enid Baas, MD      . furosemide (LASIX) 250 mg in dextrose 5 % 250 mL (1 mg/mL) infusion  4 mg/hr Intravenous Continuous Trudee Chirino, MD      . heparin injection 5,000 Units  5,000 Units Subcutaneous Q8H Enid Baas, MD      . hydroxychloroquine (PLAQUENIL) tablet 200 mg  200 mg Oral BID Enid Baas, MD      . leflunomide (ARAVA) tablet 20 mg  20 mg Oral Daily Enid Baas, MD      . Melene Muller ON 08/07/2015] levothyroxine (SYNTHROID, LEVOTHROID) tablet 75 mcg  75 mcg Oral QAC breakfast Enid Baas, MD      . mirtazapine (REMERON) tablet 7.5 mg  7.5 mg Oral QHS Enid Baas, MD      . montelukast (SINGULAIR) tablet 10 mg  10 mg Oral Daily Enid Baas, MD      . multivitamin with minerals tablet   Oral Daily Enid Baas, MD      . nitroGLYCERIN (NITROSTAT) SL tablet 0.4 mg  0.4 mg Sublingual Q5 min PRN Enid Baas, MD      . ondansetron (ZOFRAN) tablet 4 mg  4 mg Oral Q6H PRN Enid Baas, MD       Or  . ondansetron (ZOFRAN) injection 4 mg  4 mg Intravenous Q6H PRN Enid Baas, MD      . pantoprazole (PROTONIX) EC tablet 40 mg  40 mg Oral Daily Enid Baas, MD      . polyethylene glycol (MIRALAX / GLYCOLAX) packet 17 g  17 g Oral Daily PRN Enid Baas, MD      . potassium chloride SA (K-DUR,KLOR-CON) CR tablet 20 mEq  20 mEq Oral Daily Enid Baas, MD      . simvastatin (ZOCOR) tablet 40 mg  40 mg Oral Daily Enid Baas, MD      . sodium chloride flush (NS) 0.9 % injection 3 mL  3 mL Intravenous Q12H Enid Baas, MD          Allergies: No Known Allergies    Past  Medical History: Past Medical  History  Diagnosis Date  . Asthma   . Arthritis   . Blood in stool   . Emphysema of lung (HCC)   . Hypertension   . Hyperlipidemia   . Heart disease   . Thyroid disease   . Ischemic cardiomyopathy   . CHF (congestive heart failure) (HCC)     systolic CHF, EF 93%  . LBBB (left bundle branch block)   . COPD (chronic obstructive pulmonary disease) (HCC)     on 2L o2 at home  . Sleep apnea   . MI (myocardial infarction) (HCC) 1995  . CAD (coronary artery disease)     s/p CABG     Past Surgical History: Past Surgical History  Procedure Laterality Date  . Coronary artery bypass graft  1995    5 vessels  . Pacemaker insertion  2002  . Reverse shoulder arthroplasty Right 2013  . Reverse total shoulder arthroplasty Left 2014  . Total knee arthroplasty  2009    both knees  . Cardiac catheterization    . Insert / replace / remove pacemaker      Research officer, political party   . Cataract extraction       Family History: Family History  Problem Relation Age of Onset  . Hyperlipidemia Father   . Stroke Father   . Hypertension Father   . Kidney disease Father   . Kidney failure Mother     Was on dialysis     Social History: Social History   Social History  . Marital Status: Married    Spouse Name: N/A  . Number of Children: N/A  . Years of Education: N/A   Occupational History  . Not on file.   Social History Main Topics  . Smoking status: Former Smoker    Quit date: 02/15/1993  . Smokeless tobacco: Never Used     Comment: Quit in 1995  . Alcohol Use: 0.0 - 0.6 oz/week    0-1 Standard drinks or equivalent per week     Comment: occasional  . Drug Use: No  . Sexual Activity: Not on file   Other Topics Concern  . Not on file   Social History Narrative   Lives at home with wife, has both cane and a walker.   Mobility limited due to breathing/dyspnea     Review of Systems: Gen: No fevers or chills HEENT: No vision or hearing problems CV: Significant  dyspnea on exertion and shortness of breath as noted above Resp: Patient reports cough, sputum, baseline COPD, home oxygen GI: Poor appetite, diarrhea, no blood in the stool GU : Decreased frequency and amount of voiding the last 2-3 days, no dysuria MS: Rheumatoid arthritis, able to walk at home with a walker and cane Derm:  No complaints Psych: No complaints Heme: No complaints Neuro: No complaints Endocrine: Goiter  Vital Signs: Blood pressure 105/74, pulse 100, temperature 98.1 F (36.7 C), resp. rate 22, weight 75.297 kg (166 lb), SpO2 96 %.  No intake or output data in the 24 hours ending 08/06/15 1643  Weight trends: Filed Weights   08/06/15 1435  Weight: 75.297 kg (166 lb)    Physical Exam: General:  elderly gentleman, mild distress, laying in the bed   HEENT  anicteric, moist mucous membranes   Neck:  supple   Lungs:  increased respiratory effort, tachypneic, bilateral basilar crackles   Heart::  irregular, tachycardic   Abdomen:  soft, nondistended, nontender   Extremities:  +  pitting edema bilaterally   Neurologic:  alert, oriented   Skin:  no acute rashes              Lab results: Basic Metabolic Panel:  Recent Labs Lab 08/06/15 1031  NA 138  K 4.1  CL 102  CO2 26  GLUCOSE 103*  BUN 40*  CREATININE 1.99*  CALCIUM 8.4*    Liver Function Tests:  Recent Labs Lab 08/06/15 1031  AST 86*  ALT 125*  ALKPHOS 109  BILITOT 1.3*  PROT 6.4*  ALBUMIN 3.6   No results for input(s): LIPASE, AMYLASE in the last 168 hours. No results for input(s): AMMONIA in the last 168 hours.  CBC:  Recent Labs Lab 08/06/15 1031  WBC 7.8  HGB 11.2*  HCT 32.9*  MCV 96.4  PLT 130*    Cardiac Enzymes:  Recent Labs Lab 08/06/15 1031  TROPONINI 0.05*    BNP: Invalid input(s): POCBNP  CBG: No results for input(s): GLUCAP in the last 168 hours.  Microbiology: No results found for this or any previous visit (from the past 720 hour(s)).    Coagulation Studies: No results for input(s): LABPROT, INR in the last 72 hours.  Urinalysis: No results for input(s): COLORURINE, LABSPEC, PHURINE, GLUCOSEU, HGBUR, BILIRUBINUR, KETONESUR, PROTEINUR, UROBILINOGEN, NITRITE, LEUKOCYTESUR in the last 72 hours.  Invalid input(s): APPERANCEUR      Imaging: Dg Chest Portable 1 View  08/06/2015  CLINICAL DATA:  Shortness of breath for the last week, worsening today. EXAM: PORTABLE CHEST 1 VIEW COMPARISON:  04/14/2015 FINDINGS: Pacemaker defibrillator remains in place. Previous median sternotomy. Chronic cardiomegaly. Chronic aortic atherosclerosis. Small amount of pleural fluid. Venous hypertension with question early interstitial edema. IMPRESSION: Cardiomegaly. Question venous hypertension with early interstitial edema. Electronically Signed   By: Paulina Fusi M.D.   On: 08/06/2015 12:56      Assessment & Plan: Pt is a 82 y.o. yo male  with medical problems of COPD requiring home oxygen, rheumatoid arthritis, hypertension, severe coronary disease with five-vessel CABG in 1995, congestive heart failure with EF 35-40%, pacemaker, AICD, history of goiter, history of renal stones in the remote past, who was admitted to [facility] on 08/06/2015 for evaluation of shortness of breath that started about a week ago.   1. Acute renal failure - Likely secondary to abnormal cardiorenal hemodynamics - Agree with renal ultrasound - Agree with holding ACE inhibitor - Avoid nephrotoxic agents, avoid hypotension  2. Shortness of breath  Clinically sounds like pulmonary edema Start patient on IV Lasix infusion Patient has history of severe congestive heart failure, 2-D echo has been ordered  3. COPD Requires Home oxygen

## 2015-08-06 NOTE — ED Notes (Signed)
Informed Rn bed ready  1355

## 2015-08-06 NOTE — ED Notes (Signed)
Per Admitting MD, fluids to be stopped.

## 2015-08-06 NOTE — ED Provider Notes (Signed)
[facility] Emergency Department Provider Note   ____________________________________________  Time seen: Approximately 12:50 PM  I have reviewed the triage vital signs and the nursing notes.   HISTORY  Chief Complaint Shortness of Breath    HPI Tom Nguyen is a 82 y.o. male complains of shortness of breath. He says he's been coughing up small amounts of white to yellowish fluid sputum on occasionally. Seems to do better with inhaler. He is also having diarrhea for the last 2-3 weeks. Had diarrhea 3 times today. He says he gets a little lightheaded when he gets up in the morning but otherwise is not lightheaded. Patient feels very weak he says. Patient's systolic blood pressure is usually below 120 but well above 100.  Past Medical History  Diagnosis Date  . Asthma   . Arthritis   . Blood in stool   . Emphysema of lung (HCC)   . Hypertension   . Hyperlipidemia   . Heart disease   . Thyroid disease   . Ischemic cardiomyopathy   . CHF (congestive heart failure) (HCC)   . LBBB (left bundle branch block)   . COPD (chronic obstructive pulmonary disease) (HCC)   . Sleep apnea   . MI (myocardial infarction) [facility]) 1995    Patient Active Problem List   Diagnosis Date Noted  . Dyspnea 08/06/2015  . Anemia 06/17/2015  . Weight loss 04/24/2015  . Respiratory failure (HCC) 04/13/2015  . Preventative health care 10/21/2014  . HLD (hyperlipidemia) 10/21/2014  . HTN (hypertension) 10/21/2014  . CKD (chronic kidney disease) stage 3, GFR 30-59 ml/min 10/21/2014  . GERD (gastroesophageal reflux disease) 10/21/2014  . Osteoporosis 10/21/2014  . CAD (coronary artery disease) 10/21/2014  . COPD (chronic obstructive pulmonary disease) (HCC) 10/21/2014  . Rheumatoid arthritis (HCC) 10/21/2014  . Rectal bleeding 10/21/2014  . Hypothyroidism 10/21/2014  . Gout 10/21/2014  . Cardiomyopathy, ischemic 10/21/2014    Past Surgical History  Procedure Laterality  Date  . Coronary artery bypass graft  1995    5 vessels  . Pacemaker insertion  2002  . Reverse shoulder arthroplasty Right 2013  . Reverse total shoulder arthroplasty Left 2014  . Total knee arthroplasty  2009    both knees  . Cardiac catheterization    . Insert / replace / remove pacemaker      Research officer, political party     Current Outpatient Rx  Name  Route  Sig  Dispense  Refill  . acetaminophen (TYLENOL) 500 MG tablet   Oral   Take 500 mg by mouth every 6 (six) hours as needed.         Marland Kitchen albuterol (PROVENTIL HFA;VENTOLIN HFA) 108 (90 Base) MCG/ACT inhaler   Inhalation   Inhale 2 puffs into the lungs every 4 (four) hours as needed for wheezing or shortness of breath.   1 Inhaler   6   . albuterol (PROVENTIL) (2.5 MG/3ML) 0.083% nebulizer solution   Nebulization   Take 3 mLs (2.5 mg total) by nebulization every 4 (four) hours. For wheezing/SOB   120 mL   12   . alendronate (FOSAMAX) 70 MG tablet   Oral   Take 70 mg by mouth once a week.          Marland Kitchen allopurinol (ZYLOPRIM) 100 MG tablet   Oral   Take 100 mg by mouth daily.          . carvedilol (COREG) 12.5 MG tablet   Oral   Take 1 tablet (  12.5 mg total) by mouth 2 (two) times daily with a meal.   180 tablet   0   . eplerenone (INSPRA) 50 MG tablet   Oral   Take 50 mg by mouth daily.          . folic acid (FOLVITE) 1 MG tablet   Oral   Take 1 mg by mouth daily.          . furosemide (LASIX) 80 MG tablet   Oral   Take 80 mg by mouth daily.          . hydroxychloroquine (PLAQUENIL) 200 MG tablet   Oral   Take 200 mg by mouth 2 (two) times daily.          Marland Kitchen leflunomide (ARAVA) 20 MG tablet   Oral   Take 20 mg by mouth daily.          Marland Kitchen levothyroxine (SYNTHROID, LEVOTHROID) 75 MCG tablet   Oral   Take 1 tablet (75 mcg total) by mouth daily before breakfast.   90 tablet   2   . lisinopril (PRINIVIL,ZESTRIL) 20 MG tablet   Oral   Take 1 tablet (20 mg total) by mouth daily.   90  tablet   0   . mirtazapine (REMERON) 7.5 MG tablet      TAKE 1 TABLET(7.5 MG) BY MOUTH AT BEDTIME   90 tablet   3   . mirtazapine (REMERON) 7.5 MG tablet      TAKE 1 TABLET(7.5 MG) BY MOUTH AT BEDTIME   90 tablet   0   . montelukast (SINGULAIR) 10 MG tablet   Oral   Take 10 mg by mouth daily.          . Multiple Vitamins-Minerals (MULTIVITAMIN ADULT PO)   Oral   Take by mouth daily.          . nitroGLYCERIN (NITROLINGUAL) 0.4 MG/SPRAY spray   Sublingual   Place 1 spray under the tongue every 5 (five) minutes x 3 doses as needed for chest pain.         Marland Kitchen omeprazole (PRILOSEC) 20 MG capsule   Oral   Take 1 capsule (20 mg total) by mouth daily.   90 capsule   0   . Potassium Chloride ER 20 MEQ TBCR   Oral   Take 20 mEq by mouth daily.   90 tablet   0   . simvastatin (ZOCOR) 40 MG tablet   Oral   Take 1 tablet (40 mg total) by mouth daily.   90 tablet   2   . traMADol (ULTRAM) 50 MG tablet   Oral   Take 1 tablet (50 mg total) by mouth 2 (two) times daily. X 7 days   15 tablet   0   . Umeclidinium-Vilanterol (ANORO ELLIPTA) 62.5-25 MCG/INH AEPB   Inhalation   Inhale 1 puff into the lungs daily.   180 each   3     Allergies Review of patient's allergies indicates no known allergies.  Family History  Problem Relation Age of Onset  . Hyperlipidemia Father   . Stroke Father   . Hypertension Father   . Kidney disease Father     Social History Social History  Substance Use Topics  . Smoking status: Former Smoker    Quit date: 02/15/1993  . Smokeless tobacco: Never Used  . Alcohol Use: 0.0 - 0.6 oz/week    0-1 Standard drinks or equivalent per week    Review  of Systems Constitutional: No fever/chills Eyes: No visual changes. ENT: No sore throat. Cardiovascular: Denies chest pain. Respiratory:shortness of breath. Gastrointestinal: No abdominal pain.  No nausea, no vomiting.  diarrhea.  No constipation. Genitourinary: Negative for  dysuria. Musculoskeletal: Negative for back pain. Skin: Negative for rash. Neurological: Negative for headaches, focal weakness or numbness.  10-point ROS otherwise negative.  ____________________________________________   PHYSICAL EXAM:  VITAL SIGNS: ED Triage Vitals  Enc Vitals Group     BP 08/06/15 1030 93/66 mmHg     Pulse Rate 08/06/15 1030 64     Resp 08/06/15 1030 16     Temp --      Temp src --      SpO2 08/06/15 1024 100 %     Weight --      Height --      Head Cir --      Peak Flow --      Pain Score --      Pain Loc --      Pain Edu? --      Excl. in GC? --     Constitutional: Alert and oriented. Well appearing and in no acute distress. Eyes: Conjunctivae are normal. PERRL. EOMI. Head: Atraumatic. Nose: No congestion/rhinnorhea. Mouth/Throat: Mucous membranes are moist.  Oropharynx non-erythematous.Lips are bluish unit was O2 sat is 100%. Neck: No stridor.  Cardiovascular: Normal rate, regular rhythm. Grossly normal heart sounds.  Good peripheral circulation. Respiratory: Normal respiratory effort.  No retractions. Lungs CTAB. Gastrointestinal: Soft and nontender. No distention. No abdominal bruits. No CVA tenderness. Musculoskeletal: No lower extremity tenderness trace edema  No joint effusions. Neurologic:  Normal speech and language. No gross focal neurologic deficits are appreciated.  Skin:  Skin is warm, dry and intact. No rash noted. Psychiatric: Mood and affect are normal. Speech and behavior are normal.  ____________________________________________   LABS (all labs ordered are listed, but only abnormal results are displayed)  Labs Reviewed  BASIC METABOLIC PANEL - Abnormal; Notable for the following:    Glucose, Bld 103 (*)    BUN 40 (*)    Creatinine, Ser 1.99 (*)    Calcium 8.4 (*)    GFR calc non Af Amer 30 (*)    GFR calc Af Amer 35 (*)    All other components within normal limits  CBC - Abnormal; Notable for the following:    RBC  3.41 (*)    Hemoglobin 11.2 (*)    HCT 32.9 (*)    RDW 18.8 (*)    Platelets 130 (*)    All other components within normal limits  HEPATIC FUNCTION PANEL - Abnormal; Notable for the following:    Total Protein 6.4 (*)    AST 86 (*)    ALT 125 (*)    Total Bilirubin 1.3 (*)    Bilirubin, Direct 0.6 (*)    All other components within normal limits  TROPONIN I - Abnormal; Notable for the following:    Troponin I 0.05 (*)    All other components within normal limits  BRAIN NATRIURETIC PEPTIDE - Abnormal; Notable for the following:    B Natriuretic Peptide 2004.0 (*)    All other components within normal limits  GASTROINTESTINAL PANEL BY PCR, STOOL (REPLACES STOOL CULTURE)  C DIFFICILE QUICK SCREEN W PCR REFLEX  LACTIC ACID, PLASMA  LACTIC ACID, PLASMA  URINALYSIS COMPLETEWITH MICROSCOPIC ([facility] ONLY)   ____________________________________________  EKG  EKG read and interpreted by me shows AV dual paced fully paced  rhythm and something that looks like a PVC the patient spiked in the middle of it no obvious acute changes ____________________________________________  RADIOLOGY  CLINICAL DATA: Shortness of breath for the last week, worsening today.  EXAM: PORTABLE CHEST 1 VIEW  COMPARISON: 04/14/2015  FINDINGS: Pacemaker defibrillator remains in place. Previous median sternotomy. Chronic cardiomegaly. Chronic aortic atherosclerosis. Small amount of pleural fluid. Venous hypertension with question early interstitial edema.  IMPRESSION: Cardiomegaly. Question venous hypertension with early interstitial edema.   Electronically Signed  By: Paulina Fusi M.D.  On: 08/06/2015 12:56 ____________________________________________   PROCEDURES    Procedures    ____________________________________________   INITIAL IMPRESSION / ASSESSMENT AND PLAN / ED COURSE  Pertinent labs & imaging results that were available during my care of the patient were  reviewed by me and considered in my medical decision making (see chart for details).   ____________________________________________   FINAL CLINICAL IMPRESSION(S) / ED DIAGNOSES  Final diagnoses:  Dehydration  Diarrhea, unspecified type  Combined systolic and diastolic congestive heart failure, unspecified congestive heart failure chronicity (HCC)  Elevated troponin      NEW MEDICATIONS STARTED DURING THIS VISIT:  New Prescriptions   No medications on file     Note:  This document was prepared using Dragon voice recognition software and may include unintentional dictation errors.    Arnaldo Natal, MD 08/06/15 1302

## 2015-08-06 NOTE — Care Management (Signed)
Referral sent to CHF portal

## 2015-08-06 NOTE — ED Notes (Signed)
Pt presented to [facility] today to be assessed for increasing SHOB in the last week.  [place] referred pt to the ED.  Pt states SHOB is worse with activity.  Pt wears 2L O2 via [place] at home.  CPAP at night.  Has pacemaker/defibrillator in place.

## 2015-08-07 ENCOUNTER — Inpatient Hospital Stay: Payer: Medicare Other

## 2015-08-07 DIAGNOSIS — I5043 Acute on chronic combined systolic (congestive) and diastolic (congestive) heart failure: Secondary | ICD-10-CM

## 2015-08-07 DIAGNOSIS — I5023 Acute on chronic systolic (congestive) heart failure: Secondary | ICD-10-CM | POA: Diagnosis present

## 2015-08-07 LAB — CBC
HCT: 33.1 % — ABNORMAL LOW (ref 40.0–52.0)
Hemoglobin: 11.1 g/dL — ABNORMAL LOW (ref 13.0–18.0)
MCH: 32.8 pg (ref 26.0–34.0)
MCHC: 33.7 g/dL (ref 32.0–36.0)
MCV: 97.4 fL (ref 80.0–100.0)
PLATELETS: 123 10*3/uL — AB (ref 150–440)
RBC: 3.4 MIL/uL — ABNORMAL LOW (ref 4.40–5.90)
RDW: 19 % — AB (ref 11.5–14.5)
WBC: 7.1 10*3/uL (ref 3.8–10.6)

## 2015-08-07 LAB — BASIC METABOLIC PANEL
Anion gap: 11 (ref 5–15)
BUN: 40 mg/dL — AB (ref 6–20)
CALCIUM: 8.4 mg/dL — AB (ref 8.9–10.3)
CO2: 24 mmol/L (ref 22–32)
CREATININE: 1.68 mg/dL — AB (ref 0.61–1.24)
Chloride: 103 mmol/L (ref 101–111)
GFR calc non Af Amer: 37 mL/min — ABNORMAL LOW (ref >60)
GFR, EST AFRICAN AMERICAN: 43 mL/min — AB (ref >60)
GLUCOSE: 103 mg/dL — AB (ref 65–99)
Potassium: 4.8 mmol/L (ref 3.5–5.1)
Sodium: 138 mmol/L (ref 135–145)

## 2015-08-07 LAB — C DIFFICILE QUICK SCREEN W PCR REFLEX
C DIFFICLE (CDIFF) ANTIGEN: NEGATIVE
C Diff interpretation: NOT DETECTED
C Diff toxin: NEGATIVE

## 2015-08-07 LAB — URINALYSIS COMPLETE WITH MICROSCOPIC (ARMC ONLY)
BACTERIA UA: NONE SEEN
Bilirubin Urine: NEGATIVE
Glucose, UA: NEGATIVE mg/dL
Hgb urine dipstick: NEGATIVE
Ketones, ur: NEGATIVE mg/dL
Leukocytes, UA: NEGATIVE
Nitrite: NEGATIVE
PROTEIN: NEGATIVE mg/dL
Specific Gravity, Urine: 1.009 (ref 1.005–1.030)
pH: 5 (ref 5.0–8.0)

## 2015-08-07 LAB — TROPONIN I: Troponin I: 0.05 ng/mL (ref <0.03)

## 2015-08-07 NOTE — Care Management Important Message (Signed)
Important Message  Patient Details  Name: Tom Nguyen MRN: [id number] Date of Birth: 1932/03/08   Medicare Important Message Given:  Yes    Marily Memos, RN 08/07/2015, 10:07 AM

## 2015-08-07 NOTE — Discharge Instructions (Signed)
[facility] Failure Clinic appointment on August 21 2015 at 12:30pm with Clarisa Kindred, FNP. Please call [phone] to reschedule.

## 2015-08-07 NOTE — Progress Notes (Signed)
Patient: Tom Nguyen / Admit Date: 08/06/2015 / Date of Encounter: 08/07/2015, 7:32 AM   Subjective: Minus only 500 mL for the admission to date. He reports not much UOP. ABle to lay slightly more supine overnight, though not back to baseline. On CPAP this morning. Renal function improving. Echo showed worsening EF from 45% in 10/2014 to 25-30% this admission. Plans for cardiac cath Monday, 7/24.   Review of Systems: Review of Systems  Constitutional: Positive for malaise/fatigue. Negative for fever, chills, weight loss and diaphoresis.  HENT: Negative for congestion.   Eyes: Negative for discharge and redness.  Respiratory: Positive for shortness of breath. Negative for cough, sputum production and wheezing.   Cardiovascular: Positive for leg swelling. Negative for chest pain, palpitations, orthopnea, claudication and PND.  Gastrointestinal: Positive for diarrhea. Negative for heartburn, nausea, vomiting and abdominal pain.  Musculoskeletal: Negative for myalgias and falls.  Skin: Negative for rash.  Neurological: Positive for weakness. Negative for dizziness, tingling, tremors, sensory change, speech change, focal weakness and loss of consciousness.  Endo/Heme/Allergies: Does not bruise/bleed easily.  Psychiatric/Behavioral: Negative for substance abuse. The patient is not nervous/anxious.   All other systems reviewed and are negative.   Objective: Telemetry: Paced, 80's bpm Physical Exam: Blood pressure 113/95, pulse 78, temperature 99 F (37.2 C), temperature source Oral, resp. rate 18, weight 165 lb 8 oz (75.07 kg), SpO2 92 %. Body mass index is 25.17 kg/(m^2). General: Well developed, well nourished, in no acute distress. Head: Normocephalic, atraumatic, sclera non-icteric, no xanthomas, nares are without discharge. Neck: Negative for carotid bruits. JVP not elevated. Lungs: Coarse bilateral breath sounds. Breathing is unlabored. Heart: RRR S1 S2 without murmurs, rubs, or  gallops.  Abdomen: Soft, non-tender, non-distended with normoactive bowel sounds. No rebound/guarding. Extremities: No clubbing or cyanosis. Trace pre-tibial edema. Distal pedal pulses are 2+ and equal bilaterally. Neuro: Alert and oriented X 3. Moves all extremities spontaneously. Psych:  Responds to questions appropriately with a normal affect.   Intake/Output Summary (Last 24 hours) at 08/07/15 0732 Last data filed at 08/07/15 0533  Gross per 24 hour  Intake      0 ml  Output    500 ml  Net   -500 ml    Inpatient Medications:  . carvedilol  3.125 mg Oral BID WC  . folic acid  1 mg Oral Daily  . heparin  5,000 Units Subcutaneous Q8H  . hydroxychloroquine  200 mg Oral BID  . leflunomide  20 mg Oral Daily  . levothyroxine  75 mcg Oral QAC breakfast  . mirtazapine  7.5 mg Oral QHS  . mometasone-formoterol  2 puff Inhalation BID  . montelukast  10 mg Oral Daily  . multivitamin with minerals   Oral Daily  . pantoprazole  40 mg Oral Daily  . potassium chloride  20 mEq Oral Daily  . simvastatin  40 mg Oral Daily  . sodium chloride flush  3 mL Intravenous Q12H   Infusions:  . furosemide (LASIX) infusion 4 mg/hr (08/06/15 1852)    Labs:  Recent Labs  08/06/15 1031 08/07/15 0243  NA 138 138  K 4.1 4.8  CL 102 103  CO2 26 24  GLUCOSE 103* 103*  BUN 40* 40*  CREATININE 1.99* 1.68*  CALCIUM 8.4* 8.4*    Recent Labs  08/06/15 1031  AST 86*  ALT 125*  ALKPHOS 109  BILITOT 1.3*  PROT 6.4*  ALBUMIN 3.6    Recent Labs  08/06/15 1031 08/07/15 0243  WBC  7.8 7.1  HGB 11.2* 11.1*  HCT 32.9* 33.1*  MCV 96.4 97.4  PLT 130* 123*    Recent Labs  08/06/15 1031 08/06/15 1655 08/06/15 2028 08/07/15 0243  TROPONINI 0.05* 0.04* 0.04* 0.05*   Invalid input(s): POCBNP No results for input(s): HGBA1C in the last 72 hours.   Weights: Filed Weights   08/06/15 1435 08/07/15 0531  Weight: 166 lb (75.297 kg) 165 lb 8 oz (75.07 kg)     Radiology/Studies:  Dg  Chest Portable 1 View  08/06/2015  CLINICAL DATA:  Shortness of breath for the last week, worsening today. EXAM: PORTABLE CHEST 1 VIEW COMPARISON:  04/14/2015 FINDINGS: Pacemaker defibrillator remains in place. Previous median sternotomy. Chronic cardiomegaly. Chronic aortic atherosclerosis. Small amount of pleural fluid. Venous hypertension with question early interstitial edema. IMPRESSION: Cardiomegaly. Question venous hypertension with early interstitial edema. Electronically Signed   By: Paulina Fusi M.D.   On: 08/06/2015 12:56     Assessment and Plan  Principal Problem:   Acute on chronic combined systolic and diastolic CHF, NYHA class 1 (HCC) Active Problems:   ARF (acute renal failure) (HCC)   Pulmonary HTN (HCC)   Centrilobular emphysema (HCC)   Diarrhea   Coronary artery disease involving coronary bypass graft of native heart without angina pectoris    1. Acute respiratory distress: -Improving -Back to baseline weight this morning of 165 pounds -Likely multifactorial including acute on chronic combined CHF and COPD exacerbation  -Cannot exclude ischemia at this time  2. Acute on chronic combined CHF/ICM/pulmonary HTN: -EF has worsened to 25-30% this admission from a prior of 40-45% in 10/2014 -BNP 2000 -Started on Lasix gtt on 7/21 with drop in weight and improvement in renal function. However, he is still symptomatic with minimal UOP -Continue Coreg, BP has been soft precluding titration of BB -Not on ACEi given acute renal injury, restart ACEi/ARB/Entresto (if BP allows) as renal function improves  3. CAD s/p CABG: -Worsening EF as above -Will need R/LHC first of the week, not currently able to perform given SOB an unable to lay flat -Not on aspirin 2/2 GI bleed -Troponin mildly elevated, possibly 2/2 the above -BB as above -Change simvastatin to Lipitor   4. HLD: -As above  5. Chronic diarrhea: -C diff is pending -Per IM/GI  6. COPD exacerbation: -May benefit  from steroids -Per IM    Signed, Eula Listen, PA-C [facility] Pager: [phone] 08/07/2015, 7:32 AM

## 2015-08-07 NOTE — Progress Notes (Signed)
[facility] [place] [facility]   PATIENT NAME: Tom Nguyen    MRN#:  [id number]  DATE OF BIRTH:  [birth year]  SUBJECTIVE:  Hospital Day: 1 day Tom Nguyen is a 82 y.o. male presenting with Shortness of Breath .   Overnight events: No overnight events Interval Events: Tired this morning no further complaints, wife at bedside  REVIEW OF SYSTEMS:  CONSTITUTIONAL: No fever, Positive fatigue or weakness.  EYES: No blurred or double vision.  EARS, NOSE, AND THROAT: No tinnitus or ear pain.  RESPIRATORY: No cough, shortness of breath, wheezing or hemoptysis.  CARDIOVASCULAR: No chest pain, orthopnea, edema.  GASTROINTESTINAL: No nausea, vomiting, positive diarrhea denies abdominal pain.  GENITOURINARY: No dysuria, hematuria.  ENDOCRINE: No polyuria, nocturia,  HEMATOLOGY: No anemia, easy bruising or bleeding SKIN: No rash or lesion. MUSCULOSKELETAL: No joint pain or arthritis.   NEUROLOGIC: No tingling, numbness, weakness.  PSYCHIATRY: No anxiety or depression.   DRUG ALLERGIES:  No Known Allergies  VITALS:  Blood pressure 109/81, pulse 86, temperature 99 F (37.2 C), temperature source Oral, resp. rate 18, weight 165 lb 8 oz (75.07 kg), SpO2 92 %.  PHYSICAL EXAMINATION:  VITAL SIGNS: Filed Vitals:   08/07/15 0531 08/07/15 0832  BP: 113/95 109/81  Pulse: 78 86  Temp: 99 F (37.2 C)   Resp: 18    GENERAL:82 y.o.male currently in no acute distress.  HEAD: Normocephalic, atraumatic.  EYES: Pupils equal, round, reactive to light. Extraocular muscles intact. No scleral icterus.  MOUTH: Moist mucosal membrane. Dentition intact. No abscess noted.  EAR, NOSE, THROAT: Clear without exudates. No external lesions.  NECK: Supple. No thyromegaly. No nodules. No JVD.  PULMONARY: Scant basilar crackle without wheeze No use of accessory muscles, Good respiratory effort. good air entry bilaterally CHEST: Nontender to palpation.  CARDIOVASCULAR: S1 and S2.  Regular rate and rhythm. No murmurs, rubs, or gallops. Trace edema. Pedal pulses 2+ bilaterally.  GASTROINTESTINAL: Soft, nontender, nondistended. No masses. Positive bowel sounds. No hepatosplenomegaly.  MUSCULOSKELETAL: No swelling, clubbing, or edema. Range of motion full in all extremities.  NEUROLOGIC: Cranial nerves II through XII are intact. No gross focal neurological deficits. Sensation intact. Reflexes intact.  SKIN: No ulceration, lesions, rashes, or cyanosis. Skin warm and dry. Turgor intact.  PSYCHIATRIC: Mood, affect within normal limits. The patient is awake, alert and oriented x 3. Insight, judgment intact.      LABORATORY PANEL:   CBC  Recent Labs Lab 08/07/15 0243  WBC 7.1  HGB 11.1*  HCT 33.1*  PLT 123*   ------------------------------------------------------------------------------------------------------------------  Chemistries   Recent Labs Lab 08/06/15 1031 08/07/15 0243  NA 138 138  K 4.1 4.8  CL 102 103  CO2 26 24  GLUCOSE 103* 103*  BUN 40* 40*  CREATININE 1.99* 1.68*  CALCIUM 8.4* 8.4*  AST 86*  --   ALT 125*  --   ALKPHOS 109  --   BILITOT 1.3*  --    ------------------------------------------------------------------------------------------------------------------  Cardiac Enzymes  Recent Labs Lab 08/07/15 0243  TROPONINI 0.05*   ------------------------------------------------------------------------------------------------------------------  RADIOLOGY:  US Renal  08/07/2015  CLINICAL DATA:  Acute renal failure. EXAM: RENAL / URINARY TRACT ULTRASOUND COMPLETE COMPARISON:  None. FINDINGS: Right Kidney: Length: 9.3 cm. Slightly increased echogenicity is noted. No mass or hydronephrosis visualized. Left Kidney: Length: 11.5 cm. Multiple cysts are noted, with the largest measuring 2.1 cm in upper pole. Slightly increased echogenicity is noted. No mass or hydronephrosis visualized. Bladder: Appears normal for degree of  bladder  distention. Bilateral ureteral jets are noted. IMPRESSION: Simple left renal cysts. No hydronephrosis or renal obstruction is noted. Slightly increased echogenicity of renal parenchyma bilaterally is noted suggesting medical renal disease. Minimal ascites is noted incidentally. Electronically Signed   By: Lupita Raider, M.D.   On: 08/07/2015 10:53   Dg Chest Portable 1 View  08/06/2015  CLINICAL DATA:  Shortness of breath for the last week, worsening today. EXAM: PORTABLE CHEST 1 VIEW COMPARISON:  04/14/2015 FINDINGS: Pacemaker defibrillator remains in place. Previous median sternotomy. Chronic cardiomegaly. Chronic aortic atherosclerosis. Small amount of pleural fluid. Venous hypertension with question early interstitial edema. IMPRESSION: Cardiomegaly. Question venous hypertension with early interstitial edema. Electronically Signed   By: Paulina Fusi M.D.   On: 08/06/2015 12:56    EKG:   Orders placed or performed during the hospital encounter of 08/06/15  . ED EKG  . ED EKG    ASSESSMENT AND PLAN:   Tom Nguyen is a 82 y.o. male presenting with Shortness of Breath . Admitted 08/06/2015 : Day #: 1 day   #1 acute on chronic systolic CHF:  Appreciate cardiology/nephrology input continue Lasix drip anticipate maybe tomorrow transitioning off  #2 acute renal failure-has CK D stage II at baseline. Creatinine baseline is between 1 to1.2  Once again appreciated consultant input, monitor renal function and urine output  #3 chronic diarrhea-following up with GI as outpatient. Main concern is fecal incontinence and 2-3 loose stools per day. Follow up on stool studies which are pending if negative can use antidiarrheals  #4 CAD status post CABG-follow-up echo and cardiology consult. Patient on Coreg, lisinopril, statin. -Not on aspirin due to recent rectal bleeds  #5 COPD with severe emphysema-on 2 L home oxygen chronically. -Follows up as an outpatient with Dr. Darrol Angel. -Continue home  inhalers. Duo nebs as needed.  #6 GERD-on PPI  #7 DVT prophylaxis-subcutaneous heparin   All the records are reviewed and case discussed with Care Management/Social Workerr. Management plans discussed with the patient, family and they are in agreement.  CODE STATUS: full TOTAL TIME TAKING CARE OF THIS PATIENT: 28 minutes.   POSSIBLE D/C IN 2-3DAYS, DEPENDING ON CLINICAL CONDITION.   Hower,  Mardi Mainland.D on 08/07/2015 at 11:38 AM  Between 7am to 6pm - Pager - [phone]  After 6pm: House Pager: - [phone]  [facility]  Office  [phone]  CC: Primary care physician; [name], DO

## 2015-08-07 NOTE — Care Management (Signed)
Have attempted to speak with wife multiple times without success. She is not in room and spouse states she has went to lunch.

## 2015-08-07 NOTE — Progress Notes (Signed)
Subjective:   Patient is feeling better today. UOP is 650cc today. S Cr has improved from 1.99 to 1.68. Leg edema is improved. No nausea or vomiting reported. Continues to have some SOB.  Objective:  Vital signs in last 24 hours:  Temp:  [97.4 F (36.3 C)-99 F (37.2 C)] 97.4 F (36.3 C) (07/21 1139) Pulse Rate:  [57-120] 80 (07/21 1139) Resp:  [18-22] 22 (07/21 1139) BP: (95-113)/(53-95) 108/90 mmHg (07/21 1139) SpO2:  [92 %-100 %] 100 % (07/21 1139) Weight:  [75.07 kg (165 lb 8 oz)-75.297 kg (166 lb)] 75.07 kg (165 lb 8 oz) (07/21 0531)  Weight change:  Filed Weights   08/06/15 1435 08/07/15 0531  Weight: 75.297 kg (166 lb) 75.07 kg (165 lb 8 oz)    Intake/Output:    Intake/Output Summary (Last 24 hours) at 08/07/15 1228 Last data filed at 08/07/15 0841  Gross per 24 hour  Intake      0 ml  Output   1150 ml  Net  -1150 ml     Physical Exam: General: Sitting in chair. NAD.   HEENT Anicteric. Moist mucus membranes.  Neck Supple. No masses.  Pulm/lungs Pursed lip breathing. Bilateral crackles.  CVS/Heart Regular rhythm. Paced on telemetry. No rub/gallop.  Abdomen:  Soft, non distended, non tender.  Extremities: No edema.  Neurologic: Awake, alert, oriented.  Skin: No acute rashes.           Basic Metabolic Panel:   Recent Labs Lab 08/06/15 1031 08/07/15 0243  NA 138 138  K 4.1 4.8  CL 102 103  CO2 26 24  GLUCOSE 103* 103*  BUN 40* 40*  CREATININE 1.99* 1.68*  CALCIUM 8.4* 8.4*     CBC:  Recent Labs Lab 08/06/15 1031 08/07/15 0243  WBC 7.8 7.1  HGB 11.2* 11.1*  HCT 32.9* 33.1*  MCV 96.4 97.4  PLT 130* 123*      Microbiology:  No results found for this or any previous visit (from the past 720 hour(s)).  Coagulation Studies: No results for input(s): LABPROT, INR in the last 72 hours.  Urinalysis: No results for input(s): COLORURINE, LABSPEC, PHURINE, GLUCOSEU, HGBUR, BILIRUBINUR, KETONESUR, PROTEINUR, UROBILINOGEN, NITRITE,  LEUKOCYTESUR in the last 72 hours.  Invalid input(s): APPERANCEUR    Imaging: US Renal  08/07/2015  CLINICAL DATA:  Acute renal failure. EXAM: RENAL / URINARY TRACT ULTRASOUND COMPLETE COMPARISON:  None. FINDINGS: Right Kidney: Length: 9.3 cm. Slightly increased echogenicity is noted. No mass or hydronephrosis visualized. Left Kidney: Length: 11.5 cm. Multiple cysts are noted, with the largest measuring 2.1 cm in upper pole. Slightly increased echogenicity is noted. No mass or hydronephrosis visualized. Bladder: Appears normal for degree of bladder distention. Bilateral ureteral jets are noted. IMPRESSION: Simple left renal cysts. No hydronephrosis or renal obstruction is noted. Slightly increased echogenicity of renal parenchyma bilaterally is noted suggesting medical renal disease. Minimal ascites is noted incidentally. Electronically Signed   By: Lupita Raider, M.D.   On: 08/07/2015 10:53   Dg Chest Portable 1 View  08/06/2015  CLINICAL DATA:  Shortness of breath for the last week, worsening today. EXAM: PORTABLE CHEST 1 VIEW COMPARISON:  04/14/2015 FINDINGS: Pacemaker defibrillator remains in place. Previous median sternotomy. Chronic cardiomegaly. Chronic aortic atherosclerosis. Small amount of pleural fluid. Venous hypertension with question early interstitial edema. IMPRESSION: Cardiomegaly. Question venous hypertension with early interstitial edema. Electronically Signed   By: Paulina Fusi M.D.   On: 08/06/2015 12:56     Medications:   .  furosemide (LASIX) infusion 4 mg/hr (08/06/15 1852)   . carvedilol  3.125 mg Oral BID WC  . folic acid  1 mg Oral Daily  . heparin  5,000 Units Subcutaneous Q8H  . hydroxychloroquine  200 mg Oral BID  . leflunomide  20 mg Oral Daily  . levothyroxine  75 mcg Oral QAC breakfast  . mirtazapine  7.5 mg Oral QHS  . mometasone-formoterol  2 puff Inhalation BID  . montelukast  10 mg Oral Daily  . multivitamin with minerals   Oral Daily  . pantoprazole   40 mg Oral Daily  . potassium chloride  20 mEq Oral Daily  . simvastatin  40 mg Oral Daily  . sodium chloride flush  3 mL Intravenous Q12H   acetaminophen **OR** acetaminophen, ipratropium-albuterol, nitroGLYCERIN, ondansetron **OR** ondansetron (ZOFRAN) IV, phenol, polyethylene glycol  Assessment/ Plan:  82 y.o. male with medical problems of COPD requiring home oxygen, rheumatoid arthritis, hypertension, severe coronary disease with five-vessel CABG in 1995, congestive heart failure with EF 35-40%, pacemaker, AICD, history of goiter, history of renal stones in the remote past, who was admitted to [facility] on 08/06/2015 for evaluation of shortness of breath that started about a week ago.   1. Acute renal failure - Likely secondary to abnormal cardiorenal hemodynamics - Agree with renal ultrasound - Agree with holding ACE inhibitor - Avoid nephrotoxic agents, avoid hypotension. - S Cr has improved from 1.99 to 1.68  2. Shortness of breath  Clinically sounds like pulmonary edema Continue patient on IV Lasix infusion Patient has history of severe congestive heart failure, 2-D echo shows decreased EF of 25-30%  3. COPD Requires Home oxygen    LOS: 1 Pelagia Iacobucci 7/21/201712:28 PM

## 2015-08-07 NOTE — Progress Notes (Signed)
Per lab, patient negative for c.diff. Will discontinue precautions per protocol.

## 2015-08-07 NOTE — Progress Notes (Signed)
Initial Heart Failure Clinic appointment scheduled for August 21, 2015 at 12:30pm. Thank you

## 2015-08-07 NOTE — Evaluation (Signed)
Physical Therapy Evaluation Patient Details Name: Tom Nguyen MRN: [id number] DOB: Sep 10, 1932 Today's Date: 08/07/2015   History of Present Illness  82 y/o male here with acute on chronic CHF, was having a lot of shortness of breath.  Pt tired, but `feeling better during PT exam.  Clinical Impression  Pt did well with PT, showed good mobility and confidence.  He had some fatigue with 200 ft of ambulation (no AD) and stairs but overall did not have any safety issues regarding PT.  Pt eager to go home and does not need further PT intervention at this time.     Follow Up Recommendations No PT follow up    Equipment Recommendations       Recommendations for Other Services       Precautions / Restrictions Restrictions Weight Bearing Restrictions: No      Mobility  Bed Mobility               General bed mobility comments: Pt in recliner on arrival  Transfers Overall transfer level: Independent Equipment used: None             General transfer comment: Pt able to standing with good confidence, maintain balance and generally has no issues.   Ambulation/Gait Ambulation/Gait assistance: Modified independent (Device/Increase time) Ambulation Distance (Feet): 200 Feet Assistive device: None       General Gait Details: Pt able to walk with good confidence, consistent speed and has no hesitancy or safety concerns.  He did become fatigued with the effort but ultimately did well.   Stairs Stairs: Yes Stairs assistance: Modified independent (Device/Increase time) Stair Management: One rail Right Number of Stairs: 4 General stair comments: Pt able to safely and confidently negotiate up/down steps w/o issue.  Wheelchair Mobility    Modified Rankin (Stroke Patients Only)       Balance Overall balance assessment: Independent                                           Pertinent Vitals/Pain Pain Assessment: No/denies pain    Home Living  Family/patient expects to be discharged to:: Private residence Living Arrangements: Spouse/significant other;Children Available Help at Discharge: Family Type of Home: House Home Access: Stairs to enter Entrance Stairs-Rails: Can reach both Entrance Stairs-Number of Steps: 2   Home Equipment: Environmental consultant - 2 wheels;Walker - 4 wheels;Cane - single point      Prior Function Level of Independence: Independent with assistive device(s);Independent         Comments: Pt on home O2, does not normally need an AD     Hand Dominance        Extremity/Trunk Assessment   Upper Extremity Assessment: Overall WFL for tasks assessed           Lower Extremity Assessment: Overall WFL for tasks assessed         Communication   Communication: No difficulties  Cognition Arousal/Alertness: Awake/alert Behavior During Therapy: WFL for tasks assessed/performed Overall Cognitive Status: Within Functional Limits for tasks assessed                      General Comments      Exercises        Assessment/Plan    PT Assessment Patent does not need any further PT services  PT Diagnosis Generalized weakness   PT Problem List    PT  Treatment Interventions     PT Goals (Current goals can be found in the Care Plan section) Acute Rehab PT Goals Patient Stated Goal: go home    Frequency     Barriers to discharge        Co-evaluation               End of Session Equipment Utilized During Treatment: Gait belt;Oxygen (2 liters) Activity Tolerance: Patient tolerated treatment well Patient left: with call bell/phone within reach;with chair alarm set           Time: 7408-1448 PT Time Calculation (min) (ACUTE ONLY): 21 min   Charges:   PT Evaluation $PT Eval Low Complexity: 1 Procedure     PT G Codes:        Malachi Pro, DPT 08/07/2015, 3:34 PM

## 2015-08-08 DIAGNOSIS — I5040 Unspecified combined systolic (congestive) and diastolic (congestive) heart failure: Secondary | ICD-10-CM | POA: Insufficient documentation

## 2015-08-08 DIAGNOSIS — R778 Other specified abnormalities of plasma proteins: Secondary | ICD-10-CM | POA: Insufficient documentation

## 2015-08-08 DIAGNOSIS — N189 Chronic kidney disease, unspecified: Secondary | ICD-10-CM

## 2015-08-08 DIAGNOSIS — N170 Acute kidney failure with tubular necrosis: Secondary | ICD-10-CM

## 2015-08-08 DIAGNOSIS — I1310 Hypertensive heart and chronic kidney disease without heart failure, with stage 1 through stage 4 chronic kidney disease, or unspecified chronic kidney disease: Secondary | ICD-10-CM | POA: Diagnosis present

## 2015-08-08 DIAGNOSIS — J9601 Acute respiratory failure with hypoxia: Secondary | ICD-10-CM

## 2015-08-08 DIAGNOSIS — R7989 Other specified abnormal findings of blood chemistry: Secondary | ICD-10-CM | POA: Insufficient documentation

## 2015-08-08 DIAGNOSIS — N179 Acute kidney failure, unspecified: Secondary | ICD-10-CM | POA: Insufficient documentation

## 2015-08-08 LAB — RENAL FUNCTION PANEL
ALBUMIN: 3.1 g/dL — AB (ref 3.5–5.0)
ANION GAP: 8 (ref 5–15)
BUN: 35 mg/dL — ABNORMAL HIGH (ref 6–20)
CALCIUM: 8.3 mg/dL — AB (ref 8.9–10.3)
CO2: 26 mmol/L (ref 22–32)
CREATININE: 1.31 mg/dL — AB (ref 0.61–1.24)
Chloride: 103 mmol/L (ref 101–111)
GFR, EST AFRICAN AMERICAN: 58 mL/min — AB (ref >60)
GFR, EST NON AFRICAN AMERICAN: 50 mL/min — AB (ref >60)
Glucose, Bld: 91 mg/dL (ref 65–99)
PHOSPHORUS: 2.4 mg/dL — AB (ref 2.5–4.6)
Potassium: 3.6 mmol/L (ref 3.5–5.1)
SODIUM: 137 mmol/L (ref 135–145)

## 2015-08-08 LAB — VITAMIN B12: VITAMIN B 12: 1074 pg/mL — AB (ref 180–914)

## 2015-08-08 LAB — MAGNESIUM: MAGNESIUM: 1.7 mg/dL (ref 1.7–2.4)

## 2015-08-08 MED ORDER — POTASSIUM CHLORIDE CRYS ER 20 MEQ PO TBCR
40.0000 meq | EXTENDED_RELEASE_TABLET | Freq: Once | ORAL | Status: AC
  Administered 2015-08-08: 40 meq via ORAL
  Filled 2015-08-08: qty 2

## 2015-08-08 NOTE — Progress Notes (Signed)
Subjective:   Patient is feeling much better today. States that he slept well last night UOP to coordinate 2250 cc last 24 hours S Cr has improved from 1.99 to 1.68 -> 1.31 Leg edema has improved. No nausea or vomiting reported. Continues to have some SOB. Sitting up in chair eating breakfast   Objective:  Vital signs in last 24 hours:  Temp:  [97.4 F (36.3 C)-98 F (36.7 C)] 97.8 F (36.6 C) (07/22 0430) Pulse Rate:  [73-80] 73 (07/22 0430) Resp:  [16-22] 16 (07/22 0430) BP: (101-110)/(59-90) 110/59 mmHg (07/22 0748) SpO2:  [96 %-100 %] 99 % (07/22 0748) Weight:  [76.703 kg (169 lb 1.6 oz)] 76.703 kg (169 lb 1.6 oz) (07/22 0430)  Weight change: 1.406 kg (3 lb 1.6 oz) Filed Weights   08/06/15 1435 08/07/15 0531 08/08/15 0430  Weight: 75.297 kg (166 lb) 75.07 kg (165 lb 8 oz) 76.703 kg (169 lb 1.6 oz)    Intake/Output:    Intake/Output Summary (Last 24 hours) at 08/08/15 1105 Last data filed at 08/08/15 1002  Gross per 24 hour  Intake 860.53 ml  Output   2400 ml  Net -1539.47 ml     Physical Exam: General: Sitting in chair. NAD.   HEENT Anicteric. Moist mucus membranes.  Neck Supple. No masses.  Pulm/lungs Normal effort, Bilateral crackles, improved exam.  CVS/Heart Regular rhythm. Paced on telemetry. No rub/gallop.  Abdomen:  Soft, non distended, non tender.  Extremities: No edema.  Neurologic: Awake, alert, oriented.  Skin: No acute rashes.           Basic Metabolic Panel:   Recent Labs Lab 08/06/15 1031 08/07/15 0243 08/08/15 0632  NA 138 138 137  K 4.1 4.8 3.6  CL 102 103 103  CO2 26 24 26   GLUCOSE 103* 103* 91  BUN 40* 40* 35*  CREATININE 1.99* 1.68* 1.31*  CALCIUM 8.4* 8.4* 8.3*  PHOS  --   --  2.4*     CBC:  Recent Labs Lab 08/06/15 1031 08/07/15 0243  WBC 7.8 7.1  HGB 11.2* 11.1*  HCT 32.9* 33.1*  MCV 96.4 97.4  PLT 130* 123*      Microbiology:  Recent Results (from the past 720 hour(s))  C difficile quick scan w  PCR reflex     Status: None   Collection Time: 08/06/15 11:39 AM  Result Value Ref Range Status   C Diff antigen NEGATIVE NEGATIVE Final   C Diff toxin NEGATIVE NEGATIVE Final   C Diff interpretation No C. difficile detected.  Final    Coagulation Studies: No results for input(s): LABPROT, INR in the last 72 hours.  Urinalysis:  Recent Labs  08/07/15 1742  COLORURINE YELLOW*  LABSPEC 1.009  PHURINE 5.0  GLUCOSEU NEGATIVE  HGBUR NEGATIVE  BILIRUBINUR NEGATIVE  KETONESUR NEGATIVE  PROTEINUR NEGATIVE  NITRITE NEGATIVE  LEUKOCYTESUR NEGATIVE      Imaging: 08/09/15 Renal  08/07/2015  CLINICAL DATA:  Acute renal failure. EXAM: RENAL / URINARY TRACT ULTRASOUND COMPLETE COMPARISON:  None. FINDINGS: Right Kidney: Length: 9.3 cm. Slightly increased echogenicity is noted. No mass or hydronephrosis visualized. Left Kidney: Length: 11.5 cm. Multiple cysts are noted, with the largest measuring 2.1 cm in upper pole. Slightly increased echogenicity is noted. No mass or hydronephrosis visualized. Bladder: Appears normal for degree of bladder distention. Bilateral ureteral jets are noted. IMPRESSION: Simple left renal cysts. No hydronephrosis or renal obstruction is noted. Slightly increased echogenicity of renal parenchyma bilaterally is noted suggesting medical renal  disease. Minimal ascites is noted incidentally. Electronically Signed   By: Lupita Raider, M.D.   On: 08/07/2015 10:53   Dg Chest Portable 1 View  08/06/2015  CLINICAL DATA:  Shortness of breath for the last week, worsening today. EXAM: PORTABLE CHEST 1 VIEW COMPARISON:  04/14/2015 FINDINGS: Pacemaker defibrillator remains in place. Previous median sternotomy. Chronic cardiomegaly. Chronic aortic atherosclerosis. Small amount of pleural fluid. Venous hypertension with question early interstitial edema. IMPRESSION: Cardiomegaly. Question venous hypertension with early interstitial edema. Electronically Signed   By: Paulina Fusi M.D.   On:  08/06/2015 12:56     Medications:   . furosemide (LASIX) infusion 4 mg/hr (08/06/15 1852)   . carvedilol  3.125 mg Oral BID WC  . folic acid  1 mg Oral Daily  . heparin  5,000 Units Subcutaneous Q8H  . hydroxychloroquine  200 mg Oral BID  . leflunomide  20 mg Oral Daily  . levothyroxine  75 mcg Oral QAC breakfast  . mirtazapine  7.5 mg Oral QHS  . mometasone-formoterol  2 puff Inhalation BID  . montelukast  10 mg Oral Daily  . multivitamin with minerals   Oral Daily  . pantoprazole  40 mg Oral Daily  . potassium chloride  20 mEq Oral Daily  . simvastatin  40 mg Oral Daily  . sodium chloride flush  3 mL Intravenous Q12H   acetaminophen **OR** acetaminophen, ipratropium-albuterol, nitroGLYCERIN, ondansetron **OR** ondansetron (ZOFRAN) IV, phenol, polyethylene glycol  Assessment/ Plan:  82 y.o. male with medical problems of COPD requiring home oxygen, rheumatoid arthritis, hypertension, severe coronary disease with five-vessel CABG in 1995, congestive heart failure with EF 35-40%, pacemaker, AICD, history of goiter, history of renal stones in the remote past, who was admitted to [facility] on 08/06/2015 for evaluation of shortness of breath that started about a week ago.   1. Acute renal failure - Likely secondary to abnormal cardiorenal hemodynamics - Renal ultrasound s unremarkable for obstruction - Agree with holding ACE inhibitor - Avoid nephrotoxic agents, avoid hypotension. - S Cr has improved further to 1.31  2. Shortness of breath  from pulmonary edema Continue patient on IV Lasix infusion or another day Patient has history of severe congestive heart failure, 2-D echo shows decreased EF of 25-30%  3. COPD Requires Home oxygen    LOS: 2 Dyan Labarbera 7/22/201711:05 AM

## 2015-08-08 NOTE — Progress Notes (Signed)
Spoke with dr. Herbie [place] cardiologist to also make aware that per ccmd patient having occasional pvc and pacer not capturing.

## 2015-08-08 NOTE — Progress Notes (Signed)
Spoke with dr. Juliene Pina to make aware per ccmd patient has muliform pvc and pacer not capturing all the time. Per md given 40 meq po potassium and check magnesium level

## 2015-08-08 NOTE — Progress Notes (Signed)
Patient: Tom Nguyen / Admit Date: 08/06/2015 / Date of Encounter: 08/08/2015, 9:40 AM  Initially on admission was placed on CPAP. Noted to have acute renal failure now improving. Echo showed worsening of EF from 45% in 10/2014 to 25-30% this admission Initial Plans for cardiac cath Monday, 7/24 - pending recovery of renal function  Subjective: Was initially feeling better today, but got short of breath moving around. Has increased worker breathing right now. Does not have a good appetite.  Out 2.25  liters yesterday plus an additional 800 a day equaling ~3 L in total for the hospitalization. Remains volume overloaded Somehow despite this urine output, weight has gone up?? Renal function continues to improve -- creatinine down to 1.31 now.  Review of Systems: Review of Systems  Constitutional: Positive for malaise/fatigue. Negative for fever, chills, weight loss and diaphoresis.  HENT: Negative for congestion.   Eyes: Negative for discharge and redness.  Respiratory: Positive for shortness of breath (Short of breath with any particular activity.). Negative for cough, sputum production and wheezing.   Cardiovascular: Positive for leg swelling (Improving). Negative for chest pain, palpitations, orthopnea, claudication and PND.  Gastrointestinal: Positive for diarrhea (No loose stool today.). Negative for heartburn, nausea, vomiting and abdominal pain.  Musculoskeletal: Negative for myalgias and falls.  Skin: Negative for rash.  Neurological: Positive for weakness. Negative for dizziness, tingling, tremors, sensory change, speech change, focal weakness and loss of consciousness.  Endo/Heme/Allergies: Does not bruise/bleed easily.  Psychiatric/Behavioral: Negative for substance abuse. The patient is not nervous/anxious.   All other systems reviewed and are negative.   Objective: Telemetry: Paced, 80's bpm Physical Exam: Blood pressure 110/59, pulse 73, temperature 97.8 F (36.6 C),  temperature source Oral, resp. rate 16, weight 169 lb 1.6 oz (76.703 kg), SpO2 99 %. Body mass index is 25.72 kg/(m^2). General: Well developed, well nourished, in mild distress with deep breathing. Seems very fatigued and tired. Head: Normocephalic, atraumatic, sclera non-icteric, no xanthomas, nares are without discharge. Neck: Negative for carotid bruits. JVP remains elevated to angle of jaw Lungs: Coarse bilateral breath sounds with basal rales. Breathing is unlabored. Heart: RRR, normal S1 S2 without murmurs, rubs, or gallops. Mildly lateralized PMI Abdomen: Soft, non-tender, non-distended with normoactive bowel sounds. No rebound/guarding. Extremities: No clubbing or cyanosis. Trace pre-tibial edema. Distal pedal pulses are 2+ and equal bilaterally. Neuro: Alert and oriented X 3. Moves all extremities spontaneously. Psych:  Responds to questions appropriately with a normal affect.   Intake/Output Summary (Last 24 hours) at 08/08/15 0940 Last data filed at 08/08/15 0859  Gross per 24 hour  Intake 380.53 ml  Output   2400 ml  Net -2019.47 ml    Inpatient Medications:  . carvedilol  3.125 mg Oral BID WC  . folic acid  1 mg Oral Daily  . heparin  5,000 Units Subcutaneous Q8H  . hydroxychloroquine  200 mg Oral BID  . leflunomide  20 mg Oral Daily  . levothyroxine  75 mcg Oral QAC breakfast  . mirtazapine  7.5 mg Oral QHS  . mometasone-formoterol  2 puff Inhalation BID  . montelukast  10 mg Oral Daily  . multivitamin with minerals   Oral Daily  . pantoprazole  40 mg Oral Daily  . potassium chloride  20 mEq Oral Daily  . simvastatin  40 mg Oral Daily  . sodium chloride flush  3 mL Intravenous Q12H   Infusions:  . furosemide (LASIX) infusion 4 mg/hr (08/06/15 1852)    Labs:  Recent Labs  08/07/15 0243 08/08/15 0632  NA 138 137  K 4.8 3.6  CL 103 103  CO2 24 26  GLUCOSE 103* 91  BUN 40* 35*  CREATININE 1.68* 1.31*  CALCIUM 8.4* 8.3*  PHOS  --  2.4*    Recent  Labs  08/06/15 1031 08/08/15 0632  AST 86*  --   ALT 125*  --   ALKPHOS 109  --   BILITOT 1.3*  --   PROT 6.4*  --   ALBUMIN 3.6 3.1*    Recent Labs  08/06/15 1031 08/07/15 0243  WBC 7.8 7.1  HGB 11.2* 11.1*  HCT 32.9* 33.1*  MCV 96.4 97.4  PLT 130* 123*    Recent Labs  08/06/15 1031 08/06/15 1655 08/06/15 2028 08/07/15 0243  TROPONINI 0.05* 0.04* 0.04* 0.05*   Invalid input(s): POCBNP No results for input(s): HGBA1C in the last 72 hours.   Weights: Filed Weights   08/06/15 1435 08/07/15 0531 08/08/15 0430  Weight: 166 lb (75.297 kg) 165 lb 8 oz (75.07 kg) 169 lb 1.6 oz (76.703 kg)     Radiology/Studies:     Assessment and Plan  Principal Problem:   Acute respiratory failure with hypoxia (HCC) - secondary to combined COPD and acute on chronic heart failure Active Problems:   Cardiomyopathy, ischemic   Acute on chronic renal failure (HCC)   Centrilobular emphysema (HCC)   Acute on chronic combined systolic and diastolic CHF, NYHA class 2 (HCC)   Cardiorenal syndrome with renal failure - improving with IV Lasix   Coronary artery disease involving coronary bypass graft of native heart without angina pectoris   Pulmonary HTN (HCC)   Diarrhea    Principal Problem:   Acute respiratory failure with hypoxia (HCC) - secondary to combined COPD and acute on chronic heart failure    Acute on chronic combined systolic and diastolic CHF, NYHA class 2 (HCC) /Cardiomyopathy, ischemic /Coronary artery disease involving coronary bypass graft of native heart without angina pectoris  Remains volume overloaded  Continue furosemide drip.   Continue carvedilol.  ACE inhibitor/ARB continue to be on hold due to acute on chronic renal failure. Renal function slightly improved. -- May benefit from restarting once renal function stabilizes. Could consider Entresto.  CAD s/p CABG: Ischemic cardiac evaluation has been planned for next week but we might need to consider  proceeding with a pharmacologic nuclear stress test first given his renal failure.  We will reassess tomorrow. If renal function continues to stabilize, may still consider cardiac catheterization -- but probably not Monday.  Continue statin - recommended converting to Lipitor.  Aspirin has been on hold for GI bleed. - We'll reassess once ischemic evaluation done    Acute on chronic renal failure (HCC) - seems to be improving with IV Lasix diuresis. Back there is at least a good component of Cardiorenal Syndrome; renal ultrasound is abnormal for obstruction. Nephrology recommends continued holding ACE inhibitor and avoiding nephrotoxic agents and hypotension.  Provided creatinine continues to improve, may want to consider converting to intermittent dosing of Lasix in order to hold it for possible catheterization. Probably best timing for catheterization would not be Monday however.      Centrilobular emphysema (HCC) - per initial recommendations, defer to internal medicine, however may benefit from steroids.        Pulmonary HTN (HCC) - related to COPD and LV failure. Continue diuresis and oxygen    Marykay Lex, M.D., M.S.  [employer] [facility]  [address]  [address] [place], [place] [address] [phone] Fax [phone]

## 2015-08-08 NOTE — Progress Notes (Signed)
[facility] - [place] at [facility]   PATIENT NAME: Tom Nguyen    MR#:  [id number]  DATE OF BIRTH:  05-23-1932  SUBJECTIVE:   Doing well this morning. Shortness of breath and lower extremity edema is improving.  REVIEW OF SYSTEMS:    Review of Systems  Constitutional: Negative for fever, chills and malaise/fatigue.  HENT: Negative for ear discharge, ear pain, hearing loss, nosebleeds and sore throat.   Eyes: Negative for blurred vision and pain.  Respiratory: Negative for cough, hemoptysis, shortness of breath and wheezing.   Cardiovascular: Negative for chest pain, palpitations and leg swelling.  Gastrointestinal: Negative for nausea, vomiting, abdominal pain, diarrhea and blood in stool.  Genitourinary: Negative for dysuria.  Musculoskeletal: Negative for back pain.  Neurological: Negative for dizziness, tremors, speech change, focal weakness, seizures and headaches.  Endo/Heme/Allergies: Does not bruise/bleed easily.  Psychiatric/Behavioral: Negative for depression, suicidal ideas and hallucinations.    Tolerating Diet:Yes      DRUG ALLERGIES:  No Known Allergies  VITALS:  Blood pressure 110/59, pulse 73, temperature 97.8 F (36.6 C), temperature source Oral, resp. rate 16, weight 76.703 kg (169 lb 1.6 oz), SpO2 99 %.  PHYSICAL EXAMINATION:   Physical Exam  Constitutional: He is oriented to person, place, and time and well-developed, well-nourished, and in no distress. No distress.  HENT:  Head: Normocephalic.  Eyes: No scleral icterus.  Neck: Normal range of motion. Neck supple. No JVD present. No tracheal deviation present.  Cardiovascular: Normal rate, regular rhythm and normal heart sounds.  Exam reveals no gallop and no friction rub.   No murmur heard. Pulmonary/Chest: Effort normal and breath sounds normal. No respiratory distress. He has no wheezes. He has no rales. He exhibits no tenderness.  Decreased breath sounds throughout  Abdominal:  Soft. Bowel sounds are normal. He exhibits no distension and no mass. There is no tenderness. There is no rebound and no guarding.  Musculoskeletal: Normal range of motion. He exhibits edema (Minimal pedal).  Neurological: He is alert and oriented to person, place, and time.  Skin: Skin is warm. No rash noted. No erythema.  Psychiatric: Affect and judgment normal.      LABORATORY PANEL:   CBC  Recent Labs Lab 08/07/15 0243  WBC 7.1  HGB 11.1*  HCT 33.1*  PLT 123*   ------------------------------------------------------------------------------------------------------------------  Chemistries   Recent Labs Lab 08/06/15 1031  08/08/15 0632  NA 138  < > 137  K 4.1  < > 3.6  CL 102  < > 103  CO2 26  < > 26  GLUCOSE 103*  < > 91  BUN 40*  < > 35*  CREATININE 1.99*  < > 1.31*  CALCIUM 8.4*  < > 8.3*  AST 86*  --   --   ALT 125*  --   --   ALKPHOS 109  --   --   BILITOT 1.3*  --   --   < > = values in this interval not displayed. ------------------------------------------------------------------------------------------------------------------  Cardiac Enzymes  Recent Labs Lab 08/06/15 1655 08/06/15 2028 08/07/15 0243  TROPONINI 0.04* 0.04* 0.05*   ------------------------------------------------------------------------------------------------------------------  RADIOLOGY:  US Renal  08/07/2015  CLINICAL DATA:  Acute renal failure. EXAM: RENAL / URINARY TRACT ULTRASOUND COMPLETE COMPARISON:  None. FINDINGS: Right Kidney: Length: 9.3 cm. Slightly increased echogenicity is noted. No mass or hydronephrosis visualized. Left Kidney: Length: 11.5 cm. Multiple cysts are noted, with the largest measuring 2.1 cm in upper pole. Slightly increased echogenicity is noted.  No mass or hydronephrosis visualized. Bladder: Appears normal for degree of bladder distention. Bilateral ureteral jets are noted. IMPRESSION: Simple left renal cysts. No hydronephrosis or renal obstruction is  noted. Slightly increased echogenicity of renal parenchyma bilaterally is noted suggesting medical renal disease. Minimal ascites is noted incidentally. Electronically Signed   By: Lupita Raider, M.D.   On: 08/07/2015 10:53   Dg Chest Portable 1 View  08/06/2015  CLINICAL DATA:  Shortness of breath for the last week, worsening today. EXAM: PORTABLE CHEST 1 VIEW COMPARISON:  04/14/2015 FINDINGS: Pacemaker defibrillator remains in place. Previous median sternotomy. Chronic cardiomegaly. Chronic aortic atherosclerosis. Small amount of pleural fluid. Venous hypertension with question early interstitial edema. IMPRESSION: Cardiomegaly. Question venous hypertension with early interstitial edema. Electronically Signed   By: Paulina Fusi M.D.   On: 08/06/2015 12:56     ASSESSMENT AND PLAN:   82 year old male with systolic heart failure EF of 25-30% presents with acute on chronic systolic heart failure.   1. Acute on chronic systolic heart failure: Ejection fraction is now 25-30%. Symptoms are slowly improving. Currently on IV Lasix drip. Likely will transition to oral Lasix seen. Appreciate cardiology consult.   2. Acute kidney injury: This is due to abnormal cardiorenal hemodynamics. Continue to hold ACE inhibitor. Avoid nephrotoxic agents. Creatinine has improved with treatment for CHF. Appreciate nephrology consult.  3. 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 Continue duo nebs and inhalers. 4. Chronic diarrhea: Patient calls up with GI. C. difficile negative.  5. CAD status post CABG: Patient is not on aspirin due to GI bleeding. Elevation in troponin is due to demand ischemia and not ACS. Continue statin.   Management plans discussed with the patient and he is in agreement.  CODE STATUS: full  TOTAL TIME TAKING CARE OF THIS PATIENT: 24 minutes.     POSSIBLE D/C 1-2 days, DEPENDING ON CLINICAL CONDITION.   Kahlel Peake M.D on  08/08/2015 at 10:43 AM  Between 7am to 6pm - Pager - [phone] After 6pm go to [email or web address] - password [email or web address]  [facility]  Office  [phone]  CC: Primary care physician; [name], DO  Note: This dictation was prepared with Dragon dictation along with smaller phrase technology. Any transcriptional errors that result from this process are unintentional.

## 2015-08-08 NOTE — Progress Notes (Signed)
A&O. On lasix drip and voiding well. On 2L O2, which is chronic. No complaints through the night. Slept well with Cpap.

## 2015-08-09 ENCOUNTER — Inpatient Hospital Stay: Payer: Medicare Other

## 2015-08-09 LAB — MAGNESIUM: Magnesium: 1.6 mg/dL — ABNORMAL LOW (ref 1.7–2.4)

## 2015-08-09 LAB — BASIC METABOLIC PANEL
Anion gap: 9 (ref 5–15)
BUN: 34 mg/dL — AB (ref 6–20)
CALCIUM: 8.5 mg/dL — AB (ref 8.9–10.3)
CO2: 26 mmol/L (ref 22–32)
CREATININE: 1.16 mg/dL (ref 0.61–1.24)
Chloride: 103 mmol/L (ref 101–111)
GFR calc Af Amer: >60 mL/min (ref >60)
GFR, EST NON AFRICAN AMERICAN: 58 mL/min — AB (ref >60)
GLUCOSE: 91 mg/dL (ref 65–99)
POTASSIUM: 4 mmol/L (ref 3.5–5.1)
SODIUM: 138 mmol/L (ref 135–145)

## 2015-08-09 LAB — TROPONIN I
TROPONIN I: 0.05 ng/mL — AB (ref <0.03)
Troponin I: 0.04 ng/mL (ref <0.03)
Troponin I: 0.05 ng/mL (ref <0.03)

## 2015-08-09 MED ORDER — MAGNESIUM SULFATE 2 GM/50ML IV SOLN
2.0000 g | Freq: Once | INTRAVENOUS | Status: AC
  Administered 2015-08-09: 2 g via INTRAVENOUS
  Filled 2015-08-09: qty 50

## 2015-08-09 MED ORDER — FUROSEMIDE 40 MG PO TABS
40.0000 mg | ORAL_TABLET | Freq: Two times a day (BID) | ORAL | Status: DC
  Administered 2015-08-09 – 2015-08-11 (×4): 40 mg via ORAL
  Filled 2015-08-09 (×4): qty 1

## 2015-08-09 MED ORDER — ISOSORBIDE MONONITRATE ER 30 MG PO TB24
30.0000 mg | ORAL_TABLET | Freq: Every day | ORAL | Status: DC
  Administered 2015-08-09 – 2015-08-12 (×3): 30 mg via ORAL
  Filled 2015-08-09 (×4): qty 1

## 2015-08-09 MED ORDER — NITROGLYCERIN 0.4 MG SL SUBL
0.4000 mg | SUBLINGUAL_TABLET | SUBLINGUAL | Status: DC | PRN
  Administered 2015-08-09 – 2015-08-11 (×3): 0.4 mg via SUBLINGUAL
  Filled 2015-08-09 (×3): qty 1

## 2015-08-09 NOTE — Progress Notes (Signed)
[facility] - [place] [facility]   PATIENT NAME: Tom Nguyen    MR#:  [id number]  DATE OF BIRTH:  1932/06/27  SUBJECTIVE:    Patient reports that he feels chest tightness this morning. He denies pain.Marland Kitchen  REVIEW OF SYSTEMS:    Review of Systems  Constitutional: Negative.  Negative for chills, fever and malaise/fatigue.  HENT: Negative.  Negative for ear discharge, ear pain, hearing loss, nosebleeds and sore throat.   Eyes: Negative.  Negative for blurred vision and pain.  Respiratory: Positive for shortness of breath. Negative for cough, hemoptysis and wheezing.   Cardiovascular: Positive for leg swelling. Negative for chest pain and palpitations.       Chest tightness  Gastrointestinal: Negative.  Negative for abdominal pain, blood in stool, diarrhea, nausea and vomiting.  Genitourinary: Negative.  Negative for dysuria.  Musculoskeletal: Negative.  Negative for back pain.  Skin: Negative.   Neurological: Negative for dizziness, tremors, speech change, focal weakness, seizures and headaches.  Endo/Heme/Allergies: Negative.  Does not bruise/bleed easily.  Psychiatric/Behavioral: Negative.  Negative for depression, hallucinations and suicidal ideas.    Tolerating Diet:Yes      DRUG ALLERGIES:  No Known Allergies  VITALS:  Blood pressure 107/69, pulse 70, temperature 97.6 F (36.4 C), temperature source Oral, resp. rate 18, height 5\' 8"  (1.727 m), weight 77.3 kg (170 lb 6.4 oz), SpO2 96 %.  PHYSICAL EXAMINATION:   Physical Exam  Constitutional: He is oriented to person, place, and time and well-developed, well-nourished, and in no distress. No distress.  HENT:  Head: Normocephalic.  Eyes: No scleral icterus.  Neck: Normal range of motion. Neck supple. No JVD present. No tracheal deviation present.  Cardiovascular: Normal rate, regular rhythm and normal heart sounds.  Exam reveals no gallop and no friction rub.   No murmur heard. Pulmonary/Chest:  Breath sounds normal. No respiratory distress. He has no wheezes. He has no rales. He exhibits no tenderness.  Abdominal: Soft. Bowel sounds are normal. He exhibits no distension and no mass. There is no tenderness. There is no rebound and no guarding.  Musculoskeletal: Normal range of motion. He exhibits edema (Minimal pedal).  Neurological: He is alert and oriented to person, place, and time.  Skin: Skin is warm. No rash noted. No erythema.  Psychiatric: Affect and judgment normal.      LABORATORY PANEL:   CBC  Recent Labs Lab 08/07/15 0243  WBC 7.1  HGB 11.1*  HCT 33.1*  PLT 123*   ------------------------------------------------------------------------------------------------------------------  Chemistries   Recent Labs Lab 08/06/15 1031  08/09/15 0555  NA 138  < > 138  K 4.1  < > 4.0  CL 102  < > 103  CO2 26  < > 26  GLUCOSE 103*  < > 91  BUN 40*  < > 34*  CREATININE 1.99*  < > 1.16  CALCIUM 8.4*  < > 8.5*  MG  --   < > 1.6*  AST 86*  --   --   ALT 125*  --   --   ALKPHOS 109  --   --   BILITOT 1.3*  --   --   < > = values in this interval not displayed. ------------------------------------------------------------------------------------------------------------------  Cardiac Enzymes  Recent Labs Lab 08/06/15 1655 08/06/15 2028 08/07/15 0243  TROPONINI 0.04* 0.04* 0.05*   ------------------------------------------------------------------------------------------------------------------  RADIOLOGY:  No results found.   ASSESSMENT AND PLAN:   82 year old male with systolic heart failure EF of 25-30% presents with acute  on chronic systolic heart failure.   1. Acute on chronic Combined diastolic and systolic heart failure: Ejection fraction is now 25-30%. Symptoms are slowly improving, however still remains volume overloaded. Continue IV Lasix drip through midnight tonight then transitioned to oral. Continue Coreg. ACE inhibitor on hold due to  acute kidney injury which is now improved and therefore patient would benefit from restarting this medication.  2. Acute kidney injury: This is due to abnormal cardiorenal hemodynamics. Creatinine has improved.   3. 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 Continue duo nebs and inhalers. 4. Chronic diarrhea: Patient follows up with GI. C. difficile negative.  5. CAD status post CABG: Patient is not on aspirin due to GI bleeding. Elevation in troponin is due to demand ischemia and not ACS. Patient would benefit from ischemic evaluation either tomorrow or Tuesday with possible cardiac catheterization given his normal creatinine. Continue statin and beta blocker. EKG this morning shows paced rhythm with PVCs. He was also given nitroglycerin.  Management plans discussed with the patient and he is in agreement.  CODE STATUS: full  TOTAL TIME TAKING CARE OF THIS PATIENT: 24 minutes.     POSSIBLE D/C 1-2 days, DEPENDING ON CLINICAL CONDITION.   Evone Arseneau M.D on 08/09/2015 at 10:31 AM  Between 7am to 6pm - Pager - [phone] After 6pm go to [email or web address] - password [email or web address]  [facility]  Office  [phone]  CC: Primary care physician; [name], DO  Note: This dictation was prepared with Dragon dictation along with smaller phrase technology. Any transcriptional errors that result from this process are unintentional.

## 2015-08-09 NOTE — Progress Notes (Signed)
Per Dr. Herbie Baltimore, stop lasix gtt after 1st dose of PO given this afternoon at 1400.

## 2015-08-09 NOTE — Progress Notes (Signed)
Subjective:   Patient is feeling about the same as yesterday UOP 2275 cc last 24 hours S Cr has improved from 1.99 to 1.68 -> 1.31->1.16 Leg edema has improved. No nausea or vomiting reported. Continues to have some SOB.    Objective:  Vital signs in last 24 hours:  Temp:  [97.6 F (36.4 C)-97.8 F (36.6 C)] 97.6 F (36.4 C) (07/23 0502) Pulse Rate:  [70-77] 70 (07/23 0502) Resp:  [18-20] 18 (07/23 0502) BP: (100-114)/(66-79) 107/69 (07/23 0502) SpO2:  [96 %-100 %] 96 % (07/23 0502) Weight:  [77.3 kg (170 lb 6.4 oz)] 77.3 kg (170 lb 6.4 oz) (07/23 0502)  Weight change: 0.59 kg (1 lb 4.8 oz) Filed Weights   08/07/15 0531 08/08/15 0430 08/09/15 0502  Weight: 75.1 kg (165 lb 8 oz) 76.7 kg (169 lb 1.6 oz) 77.3 kg (170 lb 6.4 oz)    Intake/Output:    Intake/Output Summary (Last 24 hours) at 08/09/15 1028 Last data filed at 08/09/15 1012  Gross per 24 hour  Intake           1115.2 ml  Output             1675 ml  Net           -559.8 ml     Physical Exam: General: Laying in bed, NAD.   HEENT Anicteric. Moist mucus membranes.  Neck Supple. No masses.  Pulm/lungs Normal effort, Bilateral crackles, improved exam.  CVS/Heart Irregular rhythm.  No rub/gallop.  Abdomen:  Soft, non distended, non tender.  Extremities: No edema.  Neurologic: Awake, alert, oriented.  Skin: No acute rashes.           Basic Metabolic Panel:   Recent Labs Lab 08/06/15 1031 08/07/15 0243 08/08/15 0632 08/08/15 1304 08/09/15 0555  NA 138 138 137  --  138  K 4.1 4.8 3.6  --  4.0  CL 102 103 103  --  103  CO2 26 24 26   --  26  GLUCOSE 103* 103* 91  --  91  BUN 40* 40* 35*  --  34*  CREATININE 1.99* 1.68* 1.31*  --  1.16  CALCIUM 8.4* 8.4* 8.3*  --  8.5*  MG  --   --   --  1.7 1.6*  PHOS  --   --  2.4*  --   --      CBC:  Recent Labs Lab 08/06/15 1031 08/07/15 0243  WBC 7.8 7.1  HGB 11.2* 11.1*  HCT 32.9* 33.1*  MCV 96.4 97.4  PLT 130* 123*       Microbiology:  Recent Results (from the past 720 hour(s))  C difficile quick scan w PCR reflex     Status: None   Collection Time: 08/06/15 11:39 AM  Result Value Ref Range Status   C Diff antigen NEGATIVE NEGATIVE Final   C Diff toxin NEGATIVE NEGATIVE Final   C Diff interpretation No C. difficile detected.  Final    Coagulation Studies: No results for input(s): LABPROT, INR in the last 72 hours.  Urinalysis:  Recent Labs  08/07/15 1742  COLORURINE YELLOW*  LABSPEC 1.009  PHURINE 5.0  GLUCOSEU NEGATIVE  HGBUR NEGATIVE  BILIRUBINUR NEGATIVE  KETONESUR NEGATIVE  PROTEINUR NEGATIVE  NITRITE NEGATIVE  LEUKOCYTESUR NEGATIVE      Imaging: No results found.   Medications:   . furosemide (LASIX) infusion 4 mg/hr (08/09/15 0933)   . carvedilol  3.125 mg Oral BID WC  . folic acid  1 mg Oral Daily  . heparin  5,000 Units Subcutaneous Q8H  . hydroxychloroquine  200 mg Oral BID  . isosorbide mononitrate  30 mg Oral Daily  . leflunomide  20 mg Oral Daily  . levothyroxine  75 mcg Oral QAC breakfast  . mirtazapine  7.5 mg Oral QHS  . mometasone-formoterol  2 puff Inhalation BID  . montelukast  10 mg Oral Daily  . multivitamin with minerals   Oral Daily  . pantoprazole  40 mg Oral Daily  . potassium chloride  20 mEq Oral Daily  . simvastatin  40 mg Oral Daily  . sodium chloride flush  3 mL Intravenous Q12H   acetaminophen **OR** acetaminophen, ipratropium-albuterol, nitroGLYCERIN, ondansetron **OR** ondansetron (ZOFRAN) IV, phenol, polyethylene glycol  Assessment/ Plan:  83 y.o. male with medical problems of COPD requiring home oxygen, rheumatoid arthritis, hypertension, severe coronary disease with five-vessel CABG in 1995, congestive heart failure with EF 35-40%, pacemaker, AICD, history of goiter, history of renal stones in the remote past, who was admitted to [facility] on 08/06/2015 for evaluation of shortness of breath that started about a week ago.   1. Acute  renal failure - Likely secondary to abnormal cardiorenal hemodynamics - Renal ultrasound s unremarkable for obstruction - Agree with holding ACE inhibitor, but may be restarted in 1-2 days - Avoid nephrotoxic agents, avoid hypotension. - S Cr has improved further to 1.16  2. Shortness of breath  from pulmonary edema Change iv lasix to oral tabs. Dose 40 mg twice daily Patient has history of severe congestive heart failure, 2-D echo shows decreased EF of 25-30%  3. COPD Requires Home oxygen   4. CAD - cardiology planning cath  - Hold lasix on the day of cardiac cath - can consider renal protection with mucomyst   LOS: 3 Tom Nguyen 7/23/201710:28 AM

## 2015-08-09 NOTE — Progress Notes (Signed)
ELECTROLYTE CONSULT NOTE - INITIAL  Pharmacy Consult for Electrolyte monitoring and replacement Indication: Lasix drip  No Known Allergies  Patient Measurements: Weight: 170 lb 6.4 oz (77.3 kg)  Vital Signs: Temp: 97.6 F (36.4 C) (07/23 0502) Temp Source: Oral (07/23 0502) BP: 107/69 (07/23 0502) Pulse Rate: 70 (07/23 0502) Intake/Output from previous day: 07/22 0701 - 07/23 0700 In: 1052 [P.O.:960; I.V.:92] Out: 2275 [Urine:2275] Intake/Output from this shift: No intake/output data recorded.  Labs:  Recent Labs  08/06/15 1031 08/07/15 0243  WBC 7.8 7.1  HGB 11.2* 11.1*  HCT 32.9* 33.1*  PLT 130* 123*     Recent Labs  08/06/15 1031 08/07/15 0243 08/08/15 0632 08/08/15 1304 08/09/15 0555  NA 138 138 137  --  138  K 4.1 4.8 3.6  --  4.0  CL 102 103 103  --  103  CO2 26 24 26   --  26  GLUCOSE 103* 103* 91  --  91  BUN 40* 40* 35*  --  34*  CREATININE 1.99* 1.68* 1.31*  --  1.16  CALCIUM 8.4* 8.4* 8.3*  --  8.5*  MG  --   --   --  1.7 1.6*  PHOS  --   --  2.4*  --   --   PROT 6.4*  --   --   --   --   ALBUMIN 3.6  --  3.1*  --   --   AST 86*  --   --   --   --   ALT 125*  --   --   --   --   ALKPHOS 109  --   --   --   --   BILITOT 1.3*  --   --   --   --   BILIDIR 0.6*  --   --   --   --   IBILI 0.7  --   --   --   --    Estimated Creatinine Clearance: 49.1 mL/min (by C-G formula based on SCr of 1.16 mg/dL).   No results for input(s): GLUCAP in the last 72 hours.  Medical History: Past Medical History:  Diagnosis Date  . Arthritis   . Asthma   . CAD (coronary artery disease)    a. s/p CABG 1995; b. stress test 2006: inf post lat infarction w/ peri-infarct ischemia, AK inferior wall & HK inferolat wall, EF 28%. unchanged from prior study 07/2002.  08/2002 Chronic combined systolic and diastolic CHF, NYHA class 3 (HCC)    a. 2007 EF 40%; b. 10/2008: EF 35% by echo; c. echo 10/16: EF 45-50%, sev inf/post wall HK, conduction abnormality, mild MR, mod  dilated LA, PASP 47 mm Hg  . Chronic diarrhea   . Chronic respiratory failure (HCC)   . COPD (chronic obstructive pulmonary disease) (HCC)    on 2L o2 at home  . Emphysema of lung (HCC)   . History of GI bleed   . Hyperlipidemia   . Hypertension   . Hypothyroid   . Ischemic cardiomyopathy   . LBBB (left bundle branch block)   . MI (myocardial infarction) (HCC) 1995  . Sleep apnea    a. on CPAP    Assessment: 82 yo male on Lasix drip. Pharmacy consulted for electrolyte management.  7/23: K+: 4.0     Mg: 1.6  Plan:  MD already ordered Magnesium 2g IV X1 today. All other electrolytes WNL. Patient is receiving potassium 8/23 PO daily. Will  F/U with am labs and replace electrolytes as needed.   Cher Nakai, PharmD Clinical Pharmacist 08/09/2015 8:08 AM

## 2015-08-09 NOTE — Progress Notes (Signed)
Patient: Tom Nguyen / Admit Date: 08/06/2015 / Date of Encounter: 08/09/2015, 11:58 AM  Initially on admission was placed on CPAP. Noted to have acute renal failure now improving. Echo showed worsening of EF from 45% in 10/2014 to 25-30% this admission Initial Plans for cardiac cath Monday, 7/24 - pending recovery of renal function  Subjective: Feeling a little bit better right now. But did have some chest tightness earlier today. Still bleeding heavily.  Output ~3.5 L in total for the hospitalization -- modest output yesterday. But renal function still improving. Remains volume overloaded clinically, but less so than before. Somehow despite this urine output, weight has gone up?? Renal function continues to improve -- creatinine down to 1.16now.  Review of Systems: Review of Systems  Constitutional: Positive for malaise/fatigue. Negative for chills, diaphoresis, fever and weight loss.  HENT: Negative for congestion.   Eyes: Negative for discharge and redness.  Respiratory: Positive for shortness of breath (Short of breath with any particular activity.). Negative for cough, sputum production and wheezing.   Cardiovascular: Negative for chest pain, palpitations and claudication. Leg swelling: Improving.  Gastrointestinal: Negative for abdominal pain, diarrhea (No loose stool today.), heartburn, nausea and vomiting.  Musculoskeletal: Negative for falls and myalgias.  Skin: Negative for rash.  Neurological: Positive for weakness. Negative for dizziness, tingling, tremors, sensory change, speech change, focal weakness and loss of consciousness.  Endo/Heme/Allergies: Does not bruise/bleed easily.  Psychiatric/Behavioral: Negative for substance abuse. The patient is not nervous/anxious.   All other systems reviewed and are negative.   Objective: Telemetry: Paced, 80's bpm Physical Exam: Blood pressure 107/69, pulse 70, temperature 97.6 F (36.4 C), temperature source Oral, resp. rate  18, height 5\' 8"  (1.727 m), weight 170 lb 6.4 oz (77.3 kg), SpO2 96 %. Body mass index is 25.91 kg/m. General: Well developed, well nourished, in mild distress with deep breathing. Seems very fatigued and tired. Head: Normocephalic, atraumatic, sclera non-icteric, no xanthomas, nares are without discharge. Neck: Negative for carotid bruits. JVP remains elevated to angle of jaw Lungs: Minimal basal rales.. Breathing is unlabored. Heart: RRR, normal S1 S2 without murmurs, rubs, or gallops. Mildly lateralized PMI Abdomen: Soft, non-tender, non-distended with normoactive bowel sounds. No rebound/guarding. Extremities: No clubbing or cyanosis. No longer has pre-tibial edema. Distal pedal pulses are 2+ and equal bilaterally. Neuro: Alert and oriented X 3. Moves all extremities spontaneously. Psych:  Responds to questions appropriately with a normal affect.   Intake/Output Summary (Last 24 hours) at 08/09/15 1158 Last data filed at 08/09/15 1030  Gross per 24 hour  Intake           1115.2 ml  Output             1750 ml  Net           -634.8 ml    Inpatient Medications:  . carvedilol  3.125 mg Oral BID WC  . folic acid  1 mg Oral Daily  . furosemide  40 mg Oral BID  . heparin  5,000 Units Subcutaneous Q8H  . hydroxychloroquine  200 mg Oral BID  . isosorbide mononitrate  30 mg Oral Daily  . leflunomide  20 mg Oral Daily  . levothyroxine  75 mcg Oral QAC breakfast  . mirtazapine  7.5 mg Oral QHS  . mometasone-formoterol  2 puff Inhalation BID  . montelukast  10 mg Oral Daily  . multivitamin with minerals   Oral Daily  . pantoprazole  40 mg Oral Daily  . potassium  chloride  20 mEq Oral Daily  . simvastatin  40 mg Oral Daily  . sodium chloride flush  3 mL Intravenous Q12H   Infusions:  . furosemide (LASIX) infusion 4 mg/hr (08/09/15 0933)    Labs:  Recent Labs  08/08/15 0632 08/08/15 1304 08/09/15 0555  NA 137  --  138  K 3.6  --  4.0  CL 103  --  103  CO2 26  --  26    GLUCOSE 91  --  91  BUN 35*  --  34*  CREATININE 1.31*  --  1.16  CALCIUM 8.3*  --  8.5*  MG  --  1.7 1.6*  PHOS 2.4*  --   --     Recent Labs  08/08/15 0632  ALBUMIN 3.1*    Recent Labs  08/07/15 0243  WBC 7.1  HGB 11.1*  HCT 33.1*  MCV 97.4  PLT 123*    Recent Labs  08/06/15 1655 08/06/15 2028 08/07/15 0243 08/09/15 1112  TROPONINI 0.04* 0.04* 0.05* 0.04*   Invalid input(s): POCBNP No results for input(s): HGBA1C in the last 72 hours.   Weights: Filed Weights   08/07/15 0531 08/08/15 0430 08/09/15 0502  Weight: 165 lb 8 oz (75.1 kg) 169 lb 1.6 oz (76.7 kg) 170 lb 6.4 oz (77.3 kg)     Radiology/Studies:     Assessment and Plan  Principal Problem:   Acute respiratory failure with hypoxia (HCC) - secondary to combined COPD and acute on chronic heart failure Active Problems:   Cardiomyopathy, ischemic   Acute on chronic renal failure (HCC)   Centrilobular emphysema (HCC)   Acute on chronic combined systolic and diastolic CHF, NYHA class 2 (HCC)   Cardiorenal syndrome with renal failure - improving with IV Lasix   Coronary artery disease involving coronary bypass graft of native heart without angina pectoris   Pulmonary HTN (HCC)   Diarrhea   ARF (acute renal failure) (HCC)   Combined systolic and diastolic congestive heart failure (HCC)   Elevated troponin  -- Plan Right and Left Heart Catheterization tomorrow   Principal Problem:   Acute respiratory failure with hypoxia (HCC) - secondary to combined COPD and acute on chronic heart failure - Diuresing well. Continues to do better, but still has signs of volume overload. Still on oxygen. - Will BE oh to assess extent of existing Heart Failure with Right Heart Catheterization Tomorrow.    Acute on chronic combined systolic and diastolic CHF, NYHA class 2 (HCC) /Cardiomyopathy, ischemic /Coronary artery disease involving coronary bypass graft of native heart without angina pectoris  Remains volume  overloaded  Continue furosemide drip. - For now, but can likely switch to twice a day dosing as early as this afternoon if possible in order to prepare for catheterization for tomorrow. With right heart catheterization numbers, we can determine if we need to continue diuresis.  Continue carvedilol - still unable to titrate.  ACE inhibitor/ARB continue to be on hold due to acute on chronic renal failure. Renal function slightly improved. -- May benefit from restarting once renal function stabilizes. Could consider Entresto.  CAD s/p CABG: Ischemic cardiac evaluation Is warranted based on his new onset cardiomyopathy and now with chest tightness. I think with his renal function improving as well as it has, we should proceed with right left heart catheterization tomorrow. -- Orders placed. We will place on schedule for tomorrow with Dr. Bascom Levels.  Please put Lasix drip on hold prior to daily at bedtime in  order to allow for precath stabilization  Continue statin - recommended converting to Lipitor.  Aspirin has been on hold for GI bleed. - We'll reassess once ischemic evaluation done    Acute on chronic renal failure (HCC) - seems to be improving with IV Lasix diuresis. Back there is at least a good component of Cardiorenal Syndrome; renal ultrasound is abnormal for obstruction. Nephrology recommends continued holding ACE inhibitor and avoiding nephrotoxic agents and hypotension.  As his creatinine is improved, may want to consider converting to intermittent dosing of Lasix in order to hold it for possible catheterization. - Now that his renal function has improved, I think with his having some chest tightness today, we should proceed with right left heart catheter revascularization tomorrow.      Centrilobular emphysema (HCC) - per initial recommendations, defer to internal medicine, however may benefit from steroids.        Pulmonary HTN (HCC) - related to COPD and LV failure. Continue diuresis  and oxygen. Assess pressures with right heart catheterization  On subcutaneous heparin for DVT prophylaxis.    Marykay Lex, M.D., M.S.  [facility]  [address] [place], [place] [address] [phone] Fax [phone]

## 2015-08-10 ENCOUNTER — Encounter
Admission: EM | Disposition: A | Discharge status: Home Health | Payer: Self-pay | Source: Home / Self Care | Attending: Internal Medicine

## 2015-08-10 ENCOUNTER — Encounter: Payer: Self-pay | Admitting: Family

## 2015-08-10 DIAGNOSIS — R7989 Other specified abnormal findings of blood chemistry: Secondary | ICD-10-CM

## 2015-08-10 LAB — BASIC METABOLIC PANEL
Anion gap: 10 (ref 5–15)
BUN: 30 mg/dL — AB (ref 6–20)
CALCIUM: 8.7 mg/dL — AB (ref 8.9–10.3)
CO2: 27 mmol/L (ref 22–32)
CREATININE: 1.18 mg/dL (ref 0.61–1.24)
Chloride: 102 mmol/L (ref 101–111)
GFR calc Af Amer: >60 mL/min (ref >60)
GFR, EST NON AFRICAN AMERICAN: 56 mL/min — AB (ref >60)
Glucose, Bld: 95 mg/dL (ref 65–99)
POTASSIUM: 3.9 mmol/L (ref 3.5–5.1)
SODIUM: 139 mmol/L (ref 135–145)

## 2015-08-10 LAB — MAGNESIUM: MAGNESIUM: 1.9 mg/dL (ref 1.7–2.4)

## 2015-08-10 SURGERY — LEFT HEART CATH AND CORONARY ANGIOGRAPHY
Anesthesia: Moderate Sedation

## 2015-08-10 MED ORDER — ENOXAPARIN SODIUM 40 MG/0.4ML ~~LOC~~ SOLN
40.0000 mg | SUBCUTANEOUS | Status: DC
  Administered 2015-08-10: 40 mg via SUBCUTANEOUS
  Filled 2015-08-10: qty 0.4

## 2015-08-10 MED ORDER — ASPIRIN 81 MG PO CHEW
81.0000 mg | CHEWABLE_TABLET | ORAL | Status: AC
  Administered 2015-08-11: 81 mg via ORAL
  Filled 2015-08-10: qty 1

## 2015-08-10 MED ORDER — SODIUM CHLORIDE 0.9% FLUSH: 3.0000 mL | INTRAVENOUS | Status: DC | PRN

## 2015-08-10 MED ORDER — SODIUM CHLORIDE 0.9 % IV SOLN: 250.0000 mL | INTRAVENOUS | Status: DC | PRN

## 2015-08-10 MED ORDER — SODIUM CHLORIDE 0.9 % IV SOLN
INTRAVENOUS | Status: DC
  Administered 2015-08-11: 02:00:00 via INTRAVENOUS

## 2015-08-10 MED ORDER — SODIUM CHLORIDE 0.9% FLUSH
3.0000 mL | Freq: Two times a day (BID) | INTRAVENOUS | Status: DC
  Administered 2015-08-10: 3 mL via INTRAVENOUS

## 2015-08-10 NOTE — Progress Notes (Signed)
Pt c/o of SOB, requesting his inhaler.  Dulera inhaler administer.O2 saturations are 98%. Pt states that his breathing is better. Will continue to monitor.   Mayra Neer M

## 2015-08-10 NOTE — Care Management Important Message (Signed)
Important Message  Patient Details  Name: Tom Nguyen MRN: [id number] Date of Birth: 12/09/1932   Medicare Important Message Given:  Yes    Marily Memos, RN 08/10/2015, 10:01 AM

## 2015-08-10 NOTE — Progress Notes (Signed)
[employer] -  at [facility]   PATIENT NAME: Tom Nguyen    MR#:  [id number]  DATE OF BIRTH:  [birth year]  SUBJECTIVE:    Shortness of breath and lower extremity have improved. Patient is transitioned to oral Lasix.  REVIEW OF SYSTEMS:    Review of Systems  Constitutional: Negative.  Negative for chills, fever and malaise/fatigue.  HENT: Negative.  Negative for ear discharge, ear pain, hearing loss, nosebleeds and sore throat.   Eyes: Negative.  Negative for blurred vision and pain.  Respiratory: Negative for cough, hemoptysis, shortness of breath and wheezing.   Cardiovascular: Negative for chest pain, palpitations and leg swelling.  Gastrointestinal: Negative.  Negative for abdominal pain, blood in stool, diarrhea, nausea and vomiting.  Genitourinary: Negative.  Negative for dysuria.  Musculoskeletal: Negative.  Negative for back pain.  Skin: Negative.   Neurological: Negative for dizziness, tremors, speech change, focal weakness, seizures and headaches.  Endo/Heme/Allergies: Negative.  Does not bruise/bleed easily.  Psychiatric/Behavioral: Negative.  Negative for depression, hallucinations and suicidal ideas.    Tolerating Diet:Yes      DRUG ALLERGIES:  No Known Allergies  VITALS:  Blood pressure 109/67, pulse 74, temperature 98 F (36.7 C), resp. rate 18, height 5\' 8"  (1.727 m), weight 74 kg (163 lb 3.2 oz), SpO2 100 %.  PHYSICAL EXAMINATION:   Physical Exam  Constitutional: He is oriented to person, place, and time and well-developed, well-nourished, and in no distress. No distress.  HENT:  Head: Normocephalic.  Eyes: No scleral icterus.  Neck: Normal range of motion. Neck supple. No JVD present. No tracheal deviation present.  Cardiovascular: Normal rate, regular rhythm and normal heart sounds.  Exam reveals no gallop and no friction rub.   No murmur heard. Pulmonary/Chest: Breath sounds normal. No respiratory distress. He has no wheezes.  He has no rales. He exhibits no tenderness.  Abdominal: Soft. Bowel sounds are normal. He exhibits no distension and no mass. There is no tenderness. There is no rebound and no guarding.  Musculoskeletal: Normal range of motion.  Neurological: He is alert and oriented to person, place, and time.  Skin: Skin is warm. No rash noted. No erythema.  Psychiatric: Affect and judgment normal.      LABORATORY PANEL:   CBC  Recent Labs Lab 08/07/15 0243  WBC 7.1  HGB 11.1*  HCT 33.1*  PLT 123*   ------------------------------------------------------------------------------------------------------------------  Chemistries   Recent Labs Lab 08/06/15 1031  08/10/15 0549  NA 138  < > 139  K 4.1  < > 3.9  CL 102  < > 102  CO2 26  < > 27  GLUCOSE 103*  < > 95  BUN 40*  < > 30*  CREATININE 1.99*  < > 1.18  CALCIUM 8.4*  < > 8.7*  MG  --   < > 1.9  AST 86*  --   --   ALT 125*  --   --   ALKPHOS 109  --   --   BILITOT 1.3*  --   --   < > = values in this interval not displayed. ------------------------------------------------------------------------------------------------------------------  Cardiac Enzymes  Recent Labs Lab 08/09/15 1112 08/09/15 1632 08/09/15 2321  TROPONINI 0.04* 0.05* 0.05*   ------------------------------------------------------------------------------------------------------------------  RADIOLOGY:  Dg Chest 1 View  Result Date: 08/09/2015 CLINICAL DATA:  Came in on 08-06-15 with SOB. Hx of CHF-follow up. Former smoker. Hx of HTN, COPD, CAD, Asthma, MI, Pacemaker, CABG. EXAM: CHEST 1 VIEW COMPARISON:  08/06/2015 FINDINGS:  Pacer/AICD device. Bilateral shoulder arthroplasty. Midline trachea. Cardiomegaly accentuated by AP portable technique. Atherosclerosis in the transverse aorta. Trace right pleural fluid or thickening, increased. No pneumothorax. Moderate interstitial edema, increased. This is asymmetric, greater on the right. Worsened right base  aeration, favoring progressive atelectasis. IMPRESSION: Moderate congestive heart failure with increase in small right pleural effusion. Developing right base airspace disease, favoring atelectasis. Aortic atherosclerosis. Electronically Signed   By: Jeronimo Greaves M.D.   On: 08/09/2015 12:11    ASSESSMENT AND PLAN:   82 year old male with systolic heart failure EF of 25-30% presents with acute on chronic systolic heart failure.   1. Acute on chronic Combined diastolic and systolic heart failure: Ejection fraction is now 25-30%. Symptoms Have improved and patient is now transitioned to oral Lasix as per cardiology.  Continue Coreg. Patient will likely be started on ARB in the next 1-2 days depending on kidney function.   2. Acute kidney injury: This is due to abnormal cardiorenal hemodynamics. Creatinine has improved.   3. 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 Continue duo nebs and inhalers. 4. Chronic diarrhea: Patient follows up with GI. C. difficile negative.  5. CAD status post CABG:Elevation in troponin is due to demand ischemia and not ACS. Continue statin and beta blocker. Plan for cardiac catheterization both the right and left heart to exclude ischemic etiology for worsening of LV function.  6. Hyperlipidemia: Continue statin.  7. Status post ICD/CRRT: Patient will follow up with Dr.klein as an outpatient to make sure device optimized as per recommendations by cardiology.  Management plans discussed with the patient and family and he is in agreement.  CODE STATUS: full  TOTAL TIME TAKING CARE OF THIS PATIENT: 24 minutes.     POSSIBLE D/C 1-2 days, DEPENDING ON CLINICAL CONDITION.   [name] M.D on 08/10/2015 at 10:53 AM  Between 7am to 6pm - Pager - [phone] After 6pm go to [email or web address] - password [email or web address]  [facility]  Office  [phone]  CC: Primary care physician; [name],  DO  Note: This dictation was prepared with Dragon dictation along with smaller phrase technology. Any transcriptional errors that result from this process are unintentional.

## 2015-08-10 NOTE — Progress Notes (Signed)
SUBJECTIVE: The patient is feeling significantly better with no orthopnea. Renal function gradually improved. His overall 4 L negative for the admission.   Vitals:   08/09/15 1200 08/09/15 1734 08/09/15 1945 08/10/15 0423  BP: 91/67 (!) 113/97 103/74 109/67  Pulse: (!) 115 (!) 102 88 74  Resp: 18  16 18   Temp:    98 F (36.7 C)  TempSrc:      SpO2: 100%  100% 100%  Weight:    163 lb 3.2 oz (74 kg)  Height:        Intake/Output Summary (Last 24 hours) at 08/10/15 0900 Last data filed at 08/10/15 0616  Gross per 24 hour  Intake            24.94 ml  Output              925 ml  Net          -900.06 ml    LABS: Basic Metabolic Panel:  Recent Labs  08/12/15 0632  08/09/15 0555 08/10/15 0549  NA 137  --  138 139  K 3.6  --  4.0 3.9  CL 103  --  103 102  CO2 26  --  26 27  GLUCOSE 91  --  91 95  BUN 35*  --  34* 30*  CREATININE 1.31*  --  1.16 1.18  CALCIUM 8.3*  --  8.5* 8.7*  MG  --   < > 1.6* 1.9  PHOS 2.4*  --   --   --   < > = values in this interval not displayed. Liver Function Tests:  Recent Labs  08/08/15 08/10/15  ALBUMIN 3.1*   No results for input(s): LIPASE, AMYLASE in the last 72 hours. CBC: No results for input(s): WBC, NEUTROABS, HGB, HCT, MCV, PLT in the last 72 hours. Cardiac Enzymes:  Recent Labs  08/09/15 1112 08/09/15 1632 08/09/15 2321  TROPONINI 0.04* 0.05* 0.05*   BNP: Invalid input(s): POCBNP D-Dimer: No results for input(s): DDIMER in the last 72 hours. Hemoglobin A1C: No results for input(s): HGBA1C in the last 72 hours. Fasting Lipid Panel: No results for input(s): CHOL, HDL, LDLCALC, TRIG, CHOLHDL, LDLDIRECT in the last 72 hours. Thyroid Function Tests: No results for input(s): TSH, T4TOTAL, T3FREE, THYROIDAB in the last 72 hours.  Invalid input(s): FREET3 Anemia Panel:  Recent Labs  08/08/15 0632  VITAMINB12 1,074*     PHYSICAL EXAM General: Well developed, well nourished, in no acute distress HEENT:   Normocephalic and atramatic Neck:  No JVD.  Lungs: Clear bilaterally to auscultation and percussion. Heart: HRRR . Normal S1 and S2 without gallops or murmurs.  Abdomen: Bowel sounds are positive, abdomen soft and non-tender  Msk:  Back normal, normal gait. Normal strength and tone for age. Extremities: No clubbing, cyanosis or edema.   Neuro: Alert and oriented X 3. Psych:  Good affect, responds appropriately  TELEMETRY: Reviewed telemetry pt inventricular paced rhythm  ASSESSMENT AND PLAN:   1. Acute on chronic systolic heart failure: Worsening LV systolic function with an EF of 25-30% previously was 40-45% in October 2016 although it has been 28% in the past. The patient's volume status improved significantly with diuresis. Furosemide was switched to 40 mg by mouth twice daily. Continue treatment with carvedilol. Will consider starting an ARB with a plan to transition to [employer]   in the near future.   2. Coronary artery disease involving native coronary arteries with other forms of angina: Status post  remote CABG in 1995. Given worsening LV systolic function and presentation with heart failure, right and left cardiac catheterization has been recommended to exclude an ischemic etiology for his heart failure. Risk and benefits were explained. The plan is to proceed today if the schedule allows given technical difficulties.  3. Status post ICD/CRT: Recommend outpatient follow-up with Dr. Graciela Husbands to make sure his device is optimized.  4. Hyperlipidemia: Continue treatment with Simvastatin.   Lorine Bears, MD, [facility] 08/10/2015 9:00 AM

## 2015-08-10 NOTE — Progress Notes (Signed)
Informed that due to A/c high humidity all OR cases cancelled including cardiac cath.  Plan D/w patient's daughter who works at [employer] in [place].  Ok to eat and NPO after MN for possible cath tomorrow.

## 2015-08-10 NOTE — Progress Notes (Signed)
ELECTROLYTE CONSULT NOTE - Follow up  Pharmacy Consult for Electrolyte monitoring and replacement Indication: Lasix drip  No Known Allergies  Patient Measurements: Height: 5\' 8"  (172.7 cm) Weight: 163 lb 3.2 oz (74 kg) IBW/kg (Calculated) : 68.4  Vital Signs: Temp: 98 F (36.7 C) (07/24 0423) BP: 109/67 (07/24 0423) Pulse Rate: 74 (07/24 0423) Intake/Output from previous day: 07/23 0701 - 07/24 0700 In: 554.9 [P.O.:480; I.V.:24.9; IV Piggyback:50] Out: 925 [Urine:925] Intake/Output from this shift: No intake/output data recorded.  Labs: No results for input(s): WBC, HGB, HCT, PLT, APTT, INR in the last 72 hours.   Recent Labs  08/08/15 0632 08/08/15 1304 08/09/15 0555 08/10/15 0549  NA 137  --  138 139  K 3.6  --  4.0 3.9  CL 103  --  103 102  CO2 26  --  26 27  GLUCOSE 91  --  91 95  BUN 35*  --  34* 30*  CREATININE 1.31*  --  1.16 1.18  CALCIUM 8.3*  --  8.5* 8.7*  MG  --  1.7 1.6* 1.9  PHOS 2.4*  --   --   --   ALBUMIN 3.1*  --   --   --    Estimated Creatinine Clearance: 48.3 mL/min (by C-G formula based on SCr of 1.18 mg/dL).   No results for input(s): GLUCAP in the last 72 hours.   Assessment: 82 yo male on Lasix drip. Pharmacy consulted for electrolyte management. Lasix drip transitioned to lasix 40 mg PO BID on 7/23.  7/23: K+ 4.0     Mg: 1.6 7/24: K+ 3.9     Mag 1.9   Plan:  K/Mag WNL today. Patient is receiving potassium 8/24 PO daily. Will F/U with am labs and replace electrolytes as needed.   , PharmD, BCPS Clinical Pharmacist 08/10/2015 8:07 AM

## 2015-08-10 NOTE — Progress Notes (Signed)
Subjective:  Creatinine has come down nicely. Currently creatinine is 1.18 with a BUN of 30. Cardiac catheterization was canceled as there is no air conditioning in the hospital at the moment.  Objective:  Vital signs in last 24 hours:  Temp:  [98 F (36.7 C)] 98 F (36.7 C) (07/24 0423) Pulse Rate:  [74-102] 74 (07/24 0423) Resp:  [16-18] 18 (07/24 0423) BP: (103-113)/(67-97) 109/67 (07/24 0423) SpO2:  [100 %] 100 % (07/24 0423) Weight:  [74 kg (163 lb 3.2 oz)] 74 kg (163 lb 3.2 oz) (07/24 0423)  Weight change: -3.266 kg (-7 lb 3.2 oz) Filed Weights   08/08/15 0430 08/09/15 0502 08/10/15 0423  Weight: 76.7 kg (169 lb 1.6 oz) 77.3 kg (170 lb 6.4 oz) 74 kg (163 lb 3.2 oz)    Intake/Output:    Intake/Output Summary (Last 24 hours) at 08/10/15 1336 Last data filed at 08/10/15 1000  Gross per 24 hour  Intake             9.94 ml  Output              551 ml  Net          -541.06 ml     Physical Exam: General: Laying in bed, NAD.   HEENT Anicteric. Moist mucus membranes.  Neck Supple.  Pulm/lungs Normal effort, Bilateral crackles, improved exam.  CVS/Heart Irregular rhythm.  No rub/gallop.  Abdomen:  Soft, non distended, non tender.  Extremities: No edema.  Neurologic: Awake, alert, oriented.  Skin: No acute rashes.           Basic Metabolic Panel:   Recent Labs Lab 08/06/15 1031 08/07/15 0243 08/08/15 0632 08/08/15 1304 08/09/15 0555 08/10/15 0549  NA 138 138 137  --  138 139  K 4.1 4.8 3.6  --  4.0 3.9  CL 102 103 103  --  103 102  CO2 26 24 26   --  26 27  GLUCOSE 103* 103* 91  --  91 95  BUN 40* 40* 35*  --  34* 30*  CREATININE 1.99* 1.68* 1.31*  --  1.16 1.18  CALCIUM 8.4* 8.4* 8.3*  --  8.5* 8.7*  MG  --   --   --  1.7 1.6* 1.9  PHOS  --   --  2.4*  --   --   --      CBC:  Recent Labs Lab 08/06/15 1031 08/07/15 0243  WBC 7.8 7.1  HGB 11.2* 11.1*  HCT 32.9* 33.1*  MCV 96.4 97.4  PLT 130* 123*      Microbiology:  Recent Results  (from the past 720 hour(s))  C difficile quick scan w PCR reflex     Status: None   Collection Time: 08/06/15 11:39 AM  Result Value Ref Range Status   C Diff antigen NEGATIVE NEGATIVE Final   C Diff toxin NEGATIVE NEGATIVE Final   C Diff interpretation No C. difficile detected.  Final    Coagulation Studies: No results for input(s): LABPROT, INR in the last 72 hours.  Urinalysis:  Recent Labs  08/07/15 1742  COLORURINE YELLOW*  LABSPEC 1.009  PHURINE 5.0  GLUCOSEU NEGATIVE  HGBUR NEGATIVE  BILIRUBINUR NEGATIVE  KETONESUR NEGATIVE  PROTEINUR NEGATIVE  NITRITE NEGATIVE  LEUKOCYTESUR NEGATIVE      Imaging: Dg Chest 1 View  Result Date: 08/09/2015 CLINICAL DATA:  Came in on 08-06-15 with SOB. Hx of CHF-follow up. Former smoker. Hx of HTN, COPD, CAD, Asthma, MI, Pacemaker,  CABG. EXAM: CHEST 1 VIEW COMPARISON:  08/06/2015 FINDINGS: Pacer/AICD device. Bilateral shoulder arthroplasty. Midline trachea. Cardiomegaly accentuated by AP portable technique. Atherosclerosis in the transverse aorta. Trace right pleural fluid or thickening, increased. No pneumothorax. Moderate interstitial edema, increased. This is asymmetric, greater on the right. Worsened right base aeration, favoring progressive atelectasis. IMPRESSION: Moderate congestive heart failure with increase in small right pleural effusion. Developing right base airspace disease, favoring atelectasis. Aortic atherosclerosis. Electronically Signed   By: Jeronimo Greaves M.D.   On: 08/09/2015 12:11    Medications:   . [START ON 08/11/2015] sodium chloride     . [START ON 08/11/2015] aspirin  81 mg Oral Pre-Cath  . carvedilol  3.125 mg Oral BID WC  . enoxaparin (LOVENOX) injection  40 mg Subcutaneous Q24H  . folic acid  1 mg Oral Daily  . furosemide  40 mg Oral BID  . hydroxychloroquine  200 mg Oral BID  . isosorbide mononitrate  30 mg Oral Daily  . leflunomide  20 mg Oral Daily  . levothyroxine  75 mcg Oral QAC breakfast  .  mirtazapine  7.5 mg Oral QHS  . mometasone-formoterol  2 puff Inhalation BID  . montelukast  10 mg Oral Daily  . multivitamin with minerals   Oral Daily  . pantoprazole  40 mg Oral Daily  . potassium chloride  20 mEq Oral Daily  . simvastatin  40 mg Oral Daily  . sodium chloride flush  3 mL Intravenous Q12H  . sodium chloride flush  3 mL Intravenous Q12H   sodium chloride, acetaminophen **OR** acetaminophen, ipratropium-albuterol, nitroGLYCERIN, ondansetron **OR** ondansetron (ZOFRAN) IV, phenol, polyethylene glycol, sodium chloride flush  Assessment/ Plan:  82 y.o. male with medical problems of COPD requiring home oxygen, rheumatoid arthritis, hypertension, severe coronary disease with five-vessel CABG in 1995, congestive heart failure with EF 35-40%, pacemaker, AICD, history of goiter, history of renal stones in the remote past, who was admitted to [facility] on 08/06/2015 for evaluation of shortness of breath that started about a week ago.   1. Acute renal failure - Likely secondary to abnormal cardiorenal hemodynamics - Renal ultrasound s unremarkable for obstruction -cardiac catheterization canceled the day as there is no air-conditioning in the hospital at the moment.  2. Shortness of breath  from pulmonary edema Continue Lasix 40 mg by mouth twice a day.  3. COPD Requires Home oxygen   4. CAD - cardiology planning cath, this was canceled secondary to no air conditioning in the hospital today.   LOS: 4 Tom Nguyen 7/24/20171:36 PM

## 2015-08-10 NOTE — Progress Notes (Signed)
Chest pain with increased shortness of breath relieved by nitro sub lingual x1 with very good effect.

## 2015-08-11 ENCOUNTER — Encounter
Admission: EM | Disposition: A | Discharge status: Home Health | Payer: Self-pay | Source: Home / Self Care | Attending: Internal Medicine

## 2015-08-11 HISTORY — PX: CARDIAC CATHETERIZATION: SHX172

## 2015-08-11 LAB — BASIC METABOLIC PANEL
Anion gap: 9 (ref 5–15)
BUN: 32 mg/dL — AB (ref 6–20)
CHLORIDE: 101 mmol/L (ref 101–111)
CO2: 28 mmol/L (ref 22–32)
Calcium: 8.6 mg/dL — ABNORMAL LOW (ref 8.9–10.3)
Creatinine, Ser: 1.26 mg/dL — ABNORMAL HIGH (ref 0.61–1.24)
GFR calc non Af Amer: 52 mL/min — ABNORMAL LOW (ref >60)
Glucose, Bld: 96 mg/dL (ref 65–99)
POTASSIUM: 4.1 mmol/L (ref 3.5–5.1)
SODIUM: 138 mmol/L (ref 135–145)

## 2015-08-11 LAB — CBC
HEMATOCRIT: 33.9 % — AB (ref 40.0–52.0)
HEMOGLOBIN: 11.5 g/dL — AB (ref 13.0–18.0)
MCH: 32.8 pg (ref 26.0–34.0)
MCHC: 34 g/dL (ref 32.0–36.0)
MCV: 96.4 fL (ref 80.0–100.0)
Platelets: 109 10*3/uL — ABNORMAL LOW (ref 150–440)
RBC: 3.51 MIL/uL — ABNORMAL LOW (ref 4.40–5.90)
RDW: 19.4 % — ABNORMAL HIGH (ref 11.5–14.5)
WBC: 7.6 10*3/uL (ref 3.8–10.6)

## 2015-08-11 LAB — MAGNESIUM: MAGNESIUM: 1.8 mg/dL (ref 1.7–2.4)

## 2015-08-11 SURGERY — RIGHT/LEFT HEART CATH AND CORONARY/GRAFT ANGIOGRAPHY
Anesthesia: Moderate Sedation

## 2015-08-11 MED ORDER — IOPAMIDOL (ISOVUE-300) INJECTION 61%
INTRAVENOUS | Status: DC | PRN
Start: 2015-08-11 — End: 2015-08-11
  Administered 2015-08-11: 100 mL via INTRA_ARTERIAL

## 2015-08-11 MED ORDER — ASPIRIN 81 MG PO CHEW: 81.0000 mg | CHEWABLE_TABLET | ORAL | Status: DC

## 2015-08-11 MED ORDER — MIDAZOLAM HCL 2 MG/2ML IJ SOLN
INTRAMUSCULAR | Status: AC
  Filled 2015-08-11: qty 2

## 2015-08-11 MED ORDER — SODIUM CHLORIDE 0.9 % IV SOLN: INTRAVENOUS | Status: DC

## 2015-08-11 MED ORDER — SODIUM CHLORIDE 0.9% FLUSH
3.0000 mL | Freq: Two times a day (BID) | INTRAVENOUS | Status: DC
Start: 2015-08-11 — End: 2015-08-11

## 2015-08-11 MED ORDER — HEPARIN (PORCINE) IN NACL 2-0.9 UNIT/ML-% IJ SOLN
INTRAMUSCULAR | Status: AC
  Filled 2015-08-11: qty 500

## 2015-08-11 MED ORDER — FUROSEMIDE 10 MG/ML IJ SOLN
60.0000 mg | Freq: Two times a day (BID) | INTRAMUSCULAR | Status: DC
  Administered 2015-08-11 – 2015-08-14 (×6): 60 mg via INTRAVENOUS
  Filled 2015-08-11 (×6): qty 6

## 2015-08-11 MED ORDER — SODIUM CHLORIDE 0.9% FLUSH
3.0000 mL | Freq: Two times a day (BID) | INTRAVENOUS | Status: DC
  Administered 2015-08-11 – 2015-08-17 (×13): 3 mL via INTRAVENOUS

## 2015-08-11 MED ORDER — SODIUM CHLORIDE 0.9 % IV SOLN: 250.0000 mL | INTRAVENOUS | Status: DC | PRN

## 2015-08-11 MED ORDER — SODIUM CHLORIDE 0.9% FLUSH: 3.0000 mL | INTRAVENOUS | Status: DC | PRN

## 2015-08-11 MED ORDER — FENTANYL CITRATE (PF) 100 MCG/2ML IJ SOLN
INTRAMUSCULAR | Status: AC
  Filled 2015-08-11: qty 2

## 2015-08-11 MED ORDER — CARVEDILOL 6.25 MG PO TABS
6.2500 mg | ORAL_TABLET | Freq: Two times a day (BID) | ORAL | Status: DC
  Administered 2015-08-11 – 2015-08-13 (×4): 6.25 mg via ORAL
  Filled 2015-08-11 (×4): qty 1

## 2015-08-11 MED ORDER — ENOXAPARIN SODIUM 40 MG/0.4ML ~~LOC~~ SOLN
40.0000 mg | SUBCUTANEOUS | Status: DC
  Administered 2015-08-12 – 2015-08-18 (×7): 40 mg via SUBCUTANEOUS
  Filled 2015-08-11 (×7): qty 0.4

## 2015-08-11 MED ORDER — MIDAZOLAM HCL 2 MG/2ML IJ SOLN
INTRAMUSCULAR | Status: DC | PRN
  Administered 2015-08-11: 0.5 mg via INTRAVENOUS

## 2015-08-11 SURGICAL SUPPLY — 13 items
CATH INFINITI 5FR JL4 (CATHETERS) ×3 IMPLANT
CATH INFINITI JR4 5F (CATHETERS) ×3 IMPLANT
CATH SWANZ 7F THERMO (CATHETERS) ×3 IMPLANT
DEVICE CLOSURE MYNXGRIP 5F (Vascular Products) ×3 IMPLANT
DEVICE CLOSURE MYNXGRIP 6/7F (Vascular Products) ×3 IMPLANT
GUIDEWIRE EMER 3M J .025X150CM (WIRE) ×3 IMPLANT
KIT MANI 3VAL PERCEP (MISCELLANEOUS) ×3 IMPLANT
KIT RIGHT HEART (MISCELLANEOUS) ×3 IMPLANT
NEEDLE PERC 18GX7CM (NEEDLE) ×3 IMPLANT
PACK CARDIAC CATH (CUSTOM PROCEDURE TRAY) ×3 IMPLANT
SHEATH AVANTI 5FR X 11CM (SHEATH) ×3 IMPLANT
SHEATH PINNACLE 7F 10CM (SHEATH) ×3 IMPLANT
WIRE EMERALD 3MM-J .035X150CM (WIRE) ×3 IMPLANT

## 2015-08-11 NOTE — Progress Notes (Signed)
Subjective:  Patient due for cardiac catheterization today. Currently denies chest pain.   Objective:  Vital signs in last 24 hours:  Temp:  [97.5 F (36.4 C)-97.9 F (36.6 C)] 97.5 F (36.4 C) (07/25 1229) Pulse Rate:  [72-81] 77 (07/25 1229) Resp:  [18-21] 21 (07/25 1229) BP: (95-117)/(57-82) 112/82 (07/25 1229) SpO2:  [96 %-100 %] 96 % (07/25 1243) Weight:  [72.1 kg (159 lb)-72.3 kg (159 lb 8 oz)] 72.1 kg (159 lb) (07/25 1229)  Weight change: -1.678 kg (-3 lb 11.2 oz) Filed Weights   08/10/15 0423 08/11/15 0432 08/11/15 1229  Weight: 74 kg (163 lb 3.2 oz) 72.3 kg (159 lb 8 oz) 72.1 kg (159 lb)    Intake/Output:    Intake/Output Summary (Last 24 hours) at 08/11/15 1324 Last data filed at 08/11/15 1158  Gross per 24 hour  Intake           259.67 ml  Output             1000 ml  Net          -740.33 ml     Physical Exam: General: Laying in bed, NAD.   HEENT Anicteric. Moist mucus membranes.  Neck Supple.  Pulm/lungs Normal effort, minimal basilar rales  CVS/Heart Irregular rhythm.  No rub/gallop.  Abdomen:  Soft, non distended, non tender.  Extremities: No edema.  Neurologic: Awake, alert, oriented.  Skin: No acute rashes.           Basic Metabolic Panel:   Recent Labs Lab 08/07/15 0243 08/08/15 0086 08/08/15 1304 08/09/15 0555 08/10/15 0549 08/11/15 0521  NA 138 137  --  138 139 138  K 4.8 3.6  --  4.0 3.9 4.1  CL 103 103  --  103 102 101  CO2 24 26  --  26 27 28   GLUCOSE 103* 91  --  91 95 96  BUN 40* 35*  --  34* 30* 32*  CREATININE 1.68* 1.31*  --  1.16 1.18 1.26*  CALCIUM 8.4* 8.3*  --  8.5* 8.7* 8.6*  MG  --   --  1.7 1.6* 1.9 1.8  PHOS  --  2.4*  --   --   --   --      CBC:  Recent Labs Lab 08/06/15 1031 08/07/15 0243 08/11/15 0521  WBC 7.8 7.1 7.6  HGB 11.2* 11.1* 11.5*  HCT 32.9* 33.1* 33.9*  MCV 96.4 97.4 96.4  PLT 130* 123* 109*      Microbiology:  Recent Results (from the past 720 hour(s))  C difficile quick scan  w PCR reflex     Status: None   Collection Time: 08/06/15 11:39 AM  Result Value Ref Range Status   C Diff antigen NEGATIVE NEGATIVE Final   C Diff toxin NEGATIVE NEGATIVE Final   C Diff interpretation No C. difficile detected.  Final    Coagulation Studies: No results for input(s): LABPROT, INR in the last 72 hours.  Urinalysis: No results for input(s): COLORURINE, LABSPEC, PHURINE, GLUCOSEU, HGBUR, BILIRUBINUR, KETONESUR, PROTEINUR, UROBILINOGEN, NITRITE, LEUKOCYTESUR in the last 72 hours.  Invalid input(s): APPERANCEUR    Imaging: No results found.   Medications:   . sodium chloride 10 mL/hr at 08/11/15 0202  . [START ON 08/12/2015] sodium chloride     . [START ON 08/12/2015] aspirin  81 mg Oral Pre-Cath  . [MAR Hold] carvedilol  3.125 mg Oral BID WC  . [MAR Hold] enoxaparin (LOVENOX) injection  40 mg Subcutaneous Q24H  . [  MAR Hold] folic acid  1 mg Oral Daily  . [MAR Hold] furosemide  40 mg Oral BID  . [MAR Hold] hydroxychloroquine  200 mg Oral BID  . [MAR Hold] isosorbide mononitrate  30 mg Oral Daily  . [MAR Hold] leflunomide  20 mg Oral Daily  . [MAR Hold] levothyroxine  75 mcg Oral QAC breakfast  . [MAR Hold] mirtazapine  7.5 mg Oral QHS  . [MAR Hold] mometasone-formoterol  2 puff Inhalation BID  . [MAR Hold] montelukast  10 mg Oral Daily  . [MAR Hold] multivitamin with minerals   Oral Daily  . [MAR Hold] pantoprazole  40 mg Oral Daily  . [MAR Hold] potassium chloride  20 mEq Oral Daily  . [MAR Hold] simvastatin  40 mg Oral Daily  . [MAR Hold] sodium chloride flush  3 mL Intravenous Q12H  . sodium chloride flush  3 mL Intravenous Q12H  . sodium chloride flush  3 mL Intravenous Q12H   sodium chloride, sodium chloride, [MAR Hold] acetaminophen **OR** [MAR Hold] acetaminophen, [MAR Hold] ipratropium-albuterol, midazolam, [MAR Hold] nitroGLYCERIN, [MAR Hold] ondansetron **OR** [MAR Hold] ondansetron (ZOFRAN) IV, [MAR Hold] phenol, [MAR Hold] polyethylene glycol, sodium  chloride flush, sodium chloride flush  Assessment/ Plan:  82 y.o. male with medical problems of COPD requiring home oxygen, rheumatoid arthritis, hypertension, severe coronary disease with five-vessel CABG in 1995, congestive heart failure with EF 35-40%, pacemaker, AICD, history of goiter, history of renal stones in the remote past, who was admitted to [facility] on 08/06/2015 for evaluation of shortness of breath that started about a week ago.   1. Acute renal failure - Likely secondary to abnormal cardiorenal hemodynamics - Renal ultrasound s unremarkable for obstruction -cardiac catheterization due for today.  Unable to administer her usual protocol for prevention of contrast-induced nephropathy given her severe underlying heart failure.  2. Shortness of breath  Improved as compared to admission.  Continue Lasix for now.  3. COPD Requires Home oxygen   4. CAD - cardiac catheterization was delayed until today. Undergoing catheterization later today.  Unable to administerIV fluid regimen as above given severe underlying heart failure.   LOS: 5 Lamark Schue 7/25/20171:24 PM

## 2015-08-11 NOTE — Progress Notes (Signed)
[facility] [place] [facility]   PATIENT NAME: Tom Nguyen    MR#:  [id number]  DATE OF BIRTH:  [birth year]  SUBJECTIVE:    Took NTG this am for chest pressure  + anxious Waiting for cath this afternoon  REVIEW OF SYSTEMS:    Review of Systems  Constitutional: Negative.  Negative for chills, fever and malaise/fatigue.  HENT: Negative.  Negative for ear discharge, ear pain, hearing loss, nosebleeds and sore throat.   Eyes: Negative.  Negative for blurred vision and pain.  Respiratory: Negative.  Negative for cough, hemoptysis, shortness of breath and wheezing.   Cardiovascular: Negative.  Negative for chest pain, palpitations and leg swelling.  Gastrointestinal: Negative.  Negative for abdominal pain, blood in stool, diarrhea, nausea and vomiting.  Genitourinary: Negative.  Negative for dysuria.  Musculoskeletal: Negative.  Negative for back pain.  Skin: Negative.   Neurological: Negative for dizziness, tremors, speech change, focal weakness, seizures and headaches.  Endo/Heme/Allergies: Negative.  Does not bruise/bleed easily.  Psychiatric/Behavioral: Negative for depression, hallucinations and suicidal ideas. The patient is nervous/anxious.     Tolerating Diet:NPO     DRUG ALLERGIES:  No Known Allergies  VITALS:  Blood pressure (!) 99/57, pulse 81, temperature 97.9 F (36.6 C), temperature source Oral, resp. rate 18, height 5\' 8"  (1.727 m), weight 72.3 kg (159 lb 8 oz), SpO2 99 %.  PHYSICAL EXAMINATION:   Physical Exam  Constitutional: He is oriented to person, place, and time and well-developed, well-nourished, and in no distress. No distress.  HENT:  Head: Normocephalic.  Eyes: No scleral icterus.  Neck: Normal range of motion. Neck supple. No JVD present. No tracheal deviation present.  Cardiovascular: Normal rate, regular rhythm and normal heart sounds.  Exam reveals no gallop and no friction rub.   No murmur heard. Pulmonary/Chest: Breath  sounds normal. No respiratory distress. He has no wheezes. He has no rales. He exhibits no tenderness.  Abdominal: Soft. Bowel sounds are normal. He exhibits no distension and no mass. There is no tenderness. There is no rebound and no guarding.  Musculoskeletal: Normal range of motion.  Neurological: He is alert and oriented to person, place, and time.  Skin: Skin is warm. No rash noted. No erythema.  Psychiatric: Affect and judgment normal.      LABORATORY PANEL:   CBC  Recent Labs Lab 08/11/15 0521  WBC 7.6  HGB 11.5*  HCT 33.9*  PLT 109*   ------------------------------------------------------------------------------------------------------------------  Chemistries   Recent Labs Lab 08/06/15 1031  08/11/15 0521  NA 138  < > 138  K 4.1  < > 4.1  CL 102  < > 101  CO2 26  < > 28  GLUCOSE 103*  < > 96  BUN 40*  < > 32*  CREATININE 1.99*  < > 1.26*  CALCIUM 8.4*  < > 8.6*  MG  --   < > 1.8  AST 86*  --   --   ALT 125*  --   --   ALKPHOS 109  --   --   BILITOT 1.3*  --   --   < > = values in this interval not displayed. ------------------------------------------------------------------------------------------------------------------  Cardiac Enzymes  Recent Labs Lab 08/09/15 1112 08/09/15 1632 08/09/15 2321  TROPONINI 0.04* 0.05* 0.05*   ------------------------------------------------------------------------------------------------------------------  RADIOLOGY:  No results found.   ASSESSMENT AND PLAN:   82 year old male with systolic heart failure EF of 25-30% presents with acute on chronic systolic heart failure.  1. Acute on chronic Combined diastolic and systolic heart failure: Ejection fraction is now 25-30%. Symptoms Have improved and patient is now transitioned to oral Lasix as per cardiology.  Continue Coreg. Patient will likely be started on ARB in the next 1-2 days depending on kidney function with plan to transition to [facility] in future  pending creatinine..   2. Acute kidney injury: This is due to abnormal cardiorenal hemodynamics. Creatinine has improved. Renal ultrasound unremarkable for hydronephrosis/obstruction  3. 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 Continue duo nebs and inhalers. 4. Chronic diarrhea: Patient follows up with GI. C. difficile negative.  5. CAD status post CABG:Elevation in troponin is due to demand ischemia and not ACS. Continue statin and beta blocker. Plan for cardiac catheterization today at 12:30 both the right and left heart to exclude ischemic etiology for worsening of LV function.  6. Hyperlipidemia: Continue statin.  7. Status post ICD/CRRT: Patient will follow up with Dr.klein as an outpatient to make sure device optimized as per recommendations by cardiology.  Management plans discussed with the patient and family and he is in agreement.  CODE STATUS: full  TOTAL TIME TAKING CARE OF THIS PATIENT: 23 minutes.     POSSIBLE D/C tomorrow, DEPENDING ON CLINICAL CONDITION/creatinine/cath.   Vanity Larsson M.D on 08/11/2015 at 10:17 AM  Between 7am to 6pm - Pager - [phone] After 6pm go to [email or web address] - password [email or web address]  [facility]  Office  [phone]  CC: Primary care physician; [name], DO  Note: This dictation was prepared with Dragon dictation along with smaller phrase technology. Any transcriptional errors that result from this process are unintentional.

## 2015-08-11 NOTE — Care Management (Signed)
Cath planned for today. Plan is to watch kidney function over night and anticipate discharge tomorrow home with home health SN.

## 2015-08-11 NOTE — Progress Notes (Signed)
Report to [name] on telemetry  Check right groin for bleeding or hematoma.  Patient will be on bedrest for 1 hours post sheath pull---out of bed at 14:402.  Bilateral pulses are 2's DP's.Tom Nguyen

## 2015-08-11 NOTE — Progress Notes (Signed)
ELECTROLYTE CONSULT NOTE - Follow up  Pharmacy Consult for Electrolyte monitoring and replacement Indication: Lasix drip  No Known Allergies  Patient Measurements: Height: 5\' 8"  (172.7 cm) Weight: 159 lb 8 oz (72.3 kg) IBW/kg (Calculated) : 68.4  Vital Signs: Temp: 97.9 F (36.6 C) (07/25 0432) Temp Source: Oral (07/25 0432) BP: 95/68 (07/25 1046) Pulse Rate: 72 (07/25 1046) Intake/Output from previous day: 07/24 0701 - 07/25 0700 In: 259.7 [P.O.:240; I.V.:19.7] Out: 1101 [Urine:1100; Stool:1] Intake/Output from this shift: No intake/output data recorded.  Labs:  Recent Labs  08/11/15 0521  WBC 7.6  HGB 11.5*  HCT 33.9*  PLT 109*     Recent Labs  08/09/15 0555 08/10/15 0549 08/11/15 0521  NA 138 139 138  K 4.0 3.9 4.1  CL 103 102 101  CO2 26 27 28   GLUCOSE 91 95 96  BUN 34* 30* 32*  CREATININE 1.16 1.18 1.26*  CALCIUM 8.5* 8.7* 8.6*  MG 1.6* 1.9 1.8   Estimated Creatinine Clearance: 45.2 mL/min (by C-G formula based on SCr of 1.26 mg/dL).   No results for input(s): GLUCAP in the last 72 hours.   Assessment: 82 yo male on Lasix drip. Pharmacy consulted for electrolyte management. Lasix drip transitioned to lasix 40 mg PO BID on 7/23.  Plan:  K/Mag WNL today. Patient is receiving potassium 96 PO daily. Will F/U with am labs and replace electrolytes as needed.   8/23, PharmD Clinical Pharmacist  08/11/2015 11:12 AM

## 2015-08-11 NOTE — Care Management (Addendum)
Met with wife at bedside.She and patient live at home with their daughter. Patient uses a cane and a walker. He has home O2 @ 2l through [facility]. Discussed home health SN with wife after discharge. She is agreeable. Prefers [facility] as she has used [facility] in the past. PCP is Dr. Lacinda Axon. Wife denies issues obtaining medications, copays or transportation.  May need Entresto. Will monitor following cath.

## 2015-08-11 NOTE — Progress Notes (Signed)
Pt returned from cath lab, report from [facility]. R femoral site WNL, no hematoma, bruising or bleeding. Distal pulses palpable and strong. VSS, no reports of pain, patient has no other complaints at this time. Pt tolerating sitting up in bed well. Per Dr. Kirke Corin, d/c IVF. Will continue to monitor.

## 2015-08-11 NOTE — Progress Notes (Signed)
Patient: Tom Nguyen / Admit Date: 08/06/2015 / Date of Encounter: 08/11/2015, 7:42 AM   Subjective: Patient's only complaint is having to wait for cardiac cath today. No chest pain or SOB. Overall, negative 4.8 L for the admission. Scr and BUN slightly increased this morning.   Review of Systems: Review of Systems  Constitutional: Positive for malaise/fatigue. Negative for chills, diaphoresis, fever and weight loss.  HENT: Negative for congestion.   Eyes: Negative for discharge and redness.  Respiratory: Negative for cough, sputum production, shortness of breath and wheezing.   Cardiovascular: Negative for chest pain, palpitations, orthopnea, claudication, leg swelling and PND.  Gastrointestinal: Negative for abdominal pain, heartburn, nausea and vomiting.  Musculoskeletal: Negative for falls and myalgias.  Skin: Negative for rash.  Neurological: Negative for dizziness, tingling, tremors, sensory change, speech change, focal weakness, loss of consciousness and weakness.  Endo/Heme/Allergies: Does not bruise/bleed easily.  Psychiatric/Behavioral: Negative for substance abuse. The patient is not nervous/anxious.        Disgruntled     Objective: Telemetry: V paced, 80s bpm Physical Exam: Blood pressure 115/69, pulse 75, temperature 97.9 F (36.6 C), temperature source Oral, resp. rate 18, height 5\' 8"  (1.727 m), weight 159 lb 8 oz (72.3 kg), SpO2 99 %. Body mass index is 24.25 kg/m. General: Well developed, well nourished, in no acute distress. Head: Normocephalic, atraumatic, sclera non-icteric, no xanthomas, nares are without discharge. Neck: Negative for carotid bruits. JVP not elevated. Lungs: Clear bilaterally to auscultation without wheezes, rales, or rhonchi. Breathing is unlabored. Heart: RRR S1 S2 without murmurs, rubs, or gallops.  Abdomen: Soft, non-tender, non-distended with normoactive bowel sounds. No rebound/guarding. Extremities: No clubbing or cyanosis. No edema.  Distal pedal pulses are 2+ and equal bilaterally. Neuro: Alert and oriented X 3. Moves all extremities spontaneously. Psych:  Responds to questions appropriately with a normal affect.   Intake/Output Summary (Last 24 hours) at 08/11/15 0742 Last data filed at 08/11/15 0430  Gross per 24 hour  Intake           259.67 ml  Output             1101 ml  Net          -841.33 ml    Inpatient Medications:  . [START ON 08/12/2015] aspirin  81 mg Oral Pre-Cath  . carvedilol  3.125 mg Oral BID WC  . enoxaparin (LOVENOX) injection  40 mg Subcutaneous Q24H  . folic acid  1 mg Oral Daily  . furosemide  40 mg Oral BID  . hydroxychloroquine  200 mg Oral BID  . isosorbide mononitrate  30 mg Oral Daily  . leflunomide  20 mg Oral Daily  . levothyroxine  75 mcg Oral QAC breakfast  . mirtazapine  7.5 mg Oral QHS  . mometasone-formoterol  2 puff Inhalation BID  . montelukast  10 mg Oral Daily  . multivitamin with minerals   Oral Daily  . pantoprazole  40 mg Oral Daily  . potassium chloride  20 mEq Oral Daily  . simvastatin  40 mg Oral Daily  . sodium chloride flush  3 mL Intravenous Q12H  . sodium chloride flush  3 mL Intravenous Q12H  . sodium chloride flush  3 mL Intravenous Q12H   Infusions:  . sodium chloride 10 mL/hr at 08/11/15 0202  . [START ON 08/12/2015] sodium chloride      Labs:  Recent Labs  08/10/15 0549 08/11/15 0521  NA 139 138  K 3.9 4.1  CL 102 101  CO2 27 28  GLUCOSE 95 96  BUN 30* 32*  CREATININE 1.18 1.26*  CALCIUM 8.7* 8.6*  MG 1.9 1.8   No results for input(s): AST, ALT, ALKPHOS, BILITOT, PROT, ALBUMIN in the last 72 hours.  Recent Labs  08/11/15 0521  WBC 7.6  HGB 11.5*  HCT 33.9*  MCV 96.4  PLT 109*    Recent Labs  08/09/15 1112 08/09/15 1632 08/09/15 2321  TROPONINI 0.04* 0.05* 0.05*   Invalid input(s): POCBNP No results for input(s): HGBA1C in the last 72 hours.   Weights: Filed Weights   08/09/15 0502 08/10/15 0423 08/11/15 0432    Weight: 170 lb 6.4 oz (77.3 kg) 163 lb 3.2 oz (74 kg) 159 lb 8 oz (72.3 kg)     Radiology/Studies:  Dg Chest 1 View  Result Date: 08/09/2015 CLINICAL DATA:  Came in on 08-06-15 with SOB. Hx of CHF-follow up. Former smoker. Hx of HTN, COPD, CAD, Asthma, MI, Pacemaker, CABG. EXAM: CHEST 1 VIEW COMPARISON:  08/06/2015 FINDINGS: Pacer/AICD device. Bilateral shoulder arthroplasty. Midline trachea. Cardiomegaly accentuated by AP portable technique. Atherosclerosis in the transverse aorta. Trace right pleural fluid or thickening, increased. No pneumothorax. Moderate interstitial edema, increased. This is asymmetric, greater on the right. Worsened right base aeration, favoring progressive atelectasis. IMPRESSION: Moderate congestive heart failure with increase in small right pleural effusion. Developing right base airspace disease, favoring atelectasis. Aortic atherosclerosis. Electronically Signed   By: Jeronimo Greaves M.D.   On: 08/09/2015 12:11  US Renal  Result Date: 08/07/2015 CLINICAL DATA:  Acute renal failure. EXAM: RENAL / URINARY TRACT ULTRASOUND COMPLETE COMPARISON:  None. FINDINGS: Right Kidney: Length: 9.3 cm. Slightly increased echogenicity is noted. No mass or hydronephrosis visualized. Left Kidney: Length: 11.5 cm. Multiple cysts are noted, with the largest measuring 2.1 cm in upper pole. Slightly increased echogenicity is noted. No mass or hydronephrosis visualized. Bladder: Appears normal for degree of bladder distention. Bilateral ureteral jets are noted. IMPRESSION: Simple left renal cysts. No hydronephrosis or renal obstruction is noted. Slightly increased echogenicity of renal parenchyma bilaterally is noted suggesting medical renal disease. Minimal ascites is noted incidentally. Electronically Signed   By: Lupita Raider, M.D.   On: 08/07/2015 10:53   Dg Chest Portable 1 View  Result Date: 08/06/2015 CLINICAL DATA:  Shortness of breath for the last week, worsening today. EXAM: PORTABLE  CHEST 1 VIEW COMPARISON:  04/14/2015 FINDINGS: Pacemaker defibrillator remains in place. Previous median sternotomy. Chronic cardiomegaly. Chronic aortic atherosclerosis. Small amount of pleural fluid. Venous hypertension with question early interstitial edema. IMPRESSION: Cardiomegaly. Question venous hypertension with early interstitial edema. Electronically Signed   By: Paulina Fusi M.D.   On: 08/06/2015 12:56     Assessment and Plan  Principal Problem:   Acute respiratory failure with hypoxia (HCC) - secondary to combined COPD and acute on chronic heart failure Active Problems:   Acute on chronic renal failure (HCC)   Pulmonary HTN (HCC)   Centrilobular emphysema (HCC)   Acute on chronic combined systolic and diastolic CHF, NYHA class 2 (HCC)   Diarrhea   Coronary artery disease involving coronary bypass graft of native heart without angina pectoris   Cardiomyopathy, ischemic   Cardiorenal syndrome with renal failure - improving with IV Lasix   ARF (acute renal failure) (HCC)   Combined systolic and diastolic congestive heart failure (HCC)   Elevated troponin    1. Acute on chronic systolic CHF: -Worsening LV systolic function with an EF of 25-30%,  previously 40-45% in October 2016 although his LV systolic function has been 28% in the paste -Volume status improved significantly with SCr and BUN slightly increased this morning indicating he is likely approaching dry weight -Now on PO Lasix, hold this morning per-cardiac cath -Continue Coreg and plan to start ARB s/p cardiac cath with the plan to transition to [facility]  3. CAD: -Status post CABG in 1995. Given worsening LV systolic function and presentation with heart failure he has been recommended to undergo R/LHC to exclude ischemic etiology of his heart failure -He is for cardiac cath today -No chest pain -Risks and benefits of cardiac catheterization have been discussed with the patient including risks of bleeding, bruising,  infection, kidney damage, stroke, heart attack, and death. The patient understands these risks and is willing to proceed with the procedure. All questions have been answered and concerns listened to  3. Status post ICD/CRT: -Recommend outpatient follow up with Dr. Graciela Husbands, MD -Optimize medical therapy as above  4. HLD: -Continue simvastatin   Signed, Eula Listen, PA-C [facility] Pager: [phone] 08/11/2015, 7:42 AM

## 2015-08-11 NOTE — Progress Notes (Signed)
PT Cancellation Note  Patient Details Name: Tom Nguyen MRN: [id number] DOB: 1932-10-02   Cancelled Treatment:    Reason Eval/Treat Not Completed: Patient at procedure or test/unavailable.  Pt currently off floor for procedure.  Of note, pt evaluated by PT 08/07/15 and no PT needs identified at that time.  Will re-attempt PT re-eval as appropriate at a later date/time.   Hendricks Limes 08/11/2015, 1:22 PM Hendricks Limes, PT [phone]

## 2015-08-11 NOTE — Interval H&P Note (Signed)
Cath Lab Visit (complete for each Cath Lab visit)  Clinical Evaluation Leading to the Procedure:   ACS: No.  Non-ACS:    Anginal Classification: CCS IV  Anti-ischemic medical therapy: Minimal Therapy (1 class of medications)  Non-Invasive Test Results: No non-invasive testing performed  Prior CABG: Previous CABG      History and Physical Interval Note:  08/11/2015 12:39 PM  Tom Nguyen  has presented today for surgery, with the diagnosis of Chest Pain; Cardiomyopathy  The various methods of treatment have been discussed with the patient and family. After consideration of risks, benefits and other options for treatment, the patient has consented to  Procedure(s): Right/Left Heart Cath and Coronary/Graft Angiography (N/A) as a surgical intervention .  The patient's history has been reviewed, patient examined, no change in status, stable for surgery.  I have reviewed the patient's chart and labs.  Questions were answered to the patient's satisfaction.     Lorine Bears

## 2015-08-11 NOTE — H&P (View-Only) (Signed)
 SUBJECTIVE: The patient is feeling significantly better with no orthopnea. Renal function gradually improved. His overall 4 L negative for the admission.   Vitals:   08/09/15 1200 08/09/15 1734 08/09/15 1945 08/10/15 0423  BP: 91/67 (!) 113/97 103/74 109/67  Pulse: (!) 115 (!) 102 88 74  Resp: 18  16 18  Temp:    98 F (36.7 C)  TempSrc:      SpO2: 100%  100% 100%  Weight:    163 lb 3.2 oz (74 kg)  Height:        Intake/Output Summary (Last 24 hours) at 08/10/15 0900 Last data filed at 08/10/15 0616  Gross per 24 hour  Intake            24.94 ml  Output              925 ml  Net          -900.06 ml    LABS: Basic Metabolic Panel:  Recent Labs  08/08/15 0632  08/09/15 0555 08/10/15 0549  NA 137  --  138 139  K 3.6  --  4.0 3.9  CL 103  --  103 102  CO2 26  --  26 27  GLUCOSE 91  --  91 95  BUN 35*  --  34* 30*  CREATININE 1.31*  --  1.16 1.18  CALCIUM 8.3*  --  8.5* 8.7*  MG  --   < > 1.6* 1.9  PHOS 2.4*  --   --   --   < > = values in this interval not displayed. Liver Function Tests:  Recent Labs  08/08/15 0632  ALBUMIN 3.1*   No results for input(s): LIPASE, AMYLASE in the last 72 hours. CBC: No results for input(s): WBC, NEUTROABS, HGB, HCT, MCV, PLT in the last 72 hours. Cardiac Enzymes:  Recent Labs  08/09/15 1112 08/09/15 1632 08/09/15 2321  TROPONINI 0.04* 0.05* 0.05*   BNP: Invalid input(s): POCBNP D-Dimer: No results for input(s): DDIMER in the last 72 hours. Hemoglobin A1C: No results for input(s): HGBA1C in the last 72 hours. Fasting Lipid Panel: No results for input(s): CHOL, HDL, LDLCALC, TRIG, CHOLHDL, LDLDIRECT in the last 72 hours. Thyroid Function Tests: No results for input(s): TSH, T4TOTAL, T3FREE, THYROIDAB in the last 72 hours.  Invalid input(s): FREET3 Anemia Panel:  Recent Labs  08/08/15 0632  VITAMINB12 1,074*     PHYSICAL EXAM General: Well developed, well nourished, in no acute distress HEENT:   Normocephalic and atramatic Neck:  No JVD.  Lungs: Clear bilaterally to auscultation and percussion. Heart: HRRR . Normal S1 and S2 without gallops or murmurs.  Abdomen: Bowel sounds are positive, abdomen soft and non-tender  Msk:  Back normal, normal gait. Normal strength and tone for age. Extremities: No clubbing, cyanosis or edema.   Neuro: Alert and oriented X 3. Psych:  Good affect, responds appropriately  TELEMETRY: Reviewed telemetry pt inventricular paced rhythm  ASSESSMENT AND PLAN:   1. Acute on chronic systolic heart failure: Worsening LV systolic function with an EF of 25-30% previously was 40-45% in October 2016 although it has been 28% in the past. The patient's volume status improved significantly with diuresis. Furosemide was switched to 40 mg by mouth twice daily. Continue treatment with carvedilol. Will consider starting an ARB with a plan to transition to Entresto   in the near future.   2. Coronary artery disease involving native coronary arteries with other forms of angina: Status post   remote CABG in 1995. Given worsening LV systolic function and presentation with heart failure, right and left cardiac catheterization has been recommended to exclude an ischemic etiology for his heart failure. Risk and benefits were explained. The plan is to proceed today if the schedule allows given technical difficulties.  3. Status post ICD/CRT: Recommend outpatient follow-up with Dr. Klein to make sure his device is optimized.  4. Hyperlipidemia: Continue treatment with Simvastatin.   Tom Dunlow, MD, FACC 08/10/2015 9:00 AM    

## 2015-08-12 ENCOUNTER — Encounter: Payer: Self-pay | Admitting: Cardiovascular Disease

## 2015-08-12 ENCOUNTER — Inpatient Hospital Stay: Payer: Medicare Other

## 2015-08-12 DIAGNOSIS — N19 Unspecified kidney failure: Secondary | ICD-10-CM

## 2015-08-12 DIAGNOSIS — I1310 Hypertensive heart and chronic kidney disease without heart failure, with stage 1 through stage 4 chronic kidney disease, or unspecified chronic kidney disease: Secondary | ICD-10-CM

## 2015-08-12 LAB — CBC
HEMATOCRIT: 36.1 % — AB (ref 40.0–52.0)
HEMOGLOBIN: 11.8 g/dL — AB (ref 13.0–18.0)
MCH: 31.9 pg (ref 26.0–34.0)
MCHC: 32.7 g/dL (ref 32.0–36.0)
MCV: 97.6 fL (ref 80.0–100.0)
Platelets: 120 10*3/uL — ABNORMAL LOW (ref 150–440)
RBC: 3.7 MIL/uL — AB (ref 4.40–5.90)
RDW: 19.3 % — ABNORMAL HIGH (ref 11.5–14.5)
WBC: 7.6 10*3/uL (ref 3.8–10.6)

## 2015-08-12 LAB — BASIC METABOLIC PANEL
ANION GAP: 13 (ref 5–15)
BUN: 39 mg/dL — ABNORMAL HIGH (ref 6–20)
CHLORIDE: 97 mmol/L — AB (ref 101–111)
CO2: 25 mmol/L (ref 22–32)
Calcium: 8.6 mg/dL — ABNORMAL LOW (ref 8.9–10.3)
Creatinine, Ser: 1.39 mg/dL — ABNORMAL HIGH (ref 0.61–1.24)
GFR calc non Af Amer: 46 mL/min — ABNORMAL LOW (ref >60)
GFR, EST AFRICAN AMERICAN: 54 mL/min — AB (ref >60)
GLUCOSE: 98 mg/dL (ref 65–99)
Potassium: 4.5 mmol/L (ref 3.5–5.1)
Sodium: 135 mmol/L (ref 135–145)

## 2015-08-12 LAB — GLUCOSE, CAPILLARY: Glucose-Capillary: 137 mg/dL — ABNORMAL HIGH (ref 65–99)

## 2015-08-12 LAB — MRSA PCR SCREENING: MRSA by PCR: NEGATIVE

## 2015-08-12 MED ORDER — MILRINONE LACTATE IN DEXTROSE 20-5 MG/100ML-% IV SOLN
0.2500 ug/kg/min | INTRAVENOUS | Status: DC
  Administered 2015-08-12: 0.257 ug/kg/min via INTRAVENOUS
  Administered 2015-08-13 (×2): 0.25 ug/kg/min via INTRAVENOUS
  Filled 2015-08-12 (×4): qty 100

## 2015-08-12 MED ORDER — CETYLPYRIDINIUM CHLORIDE 0.05 % MT LIQD
7.0000 mL | Freq: Two times a day (BID) | OROMUCOSAL | Status: DC
  Administered 2015-08-12 – 2015-08-18 (×12): 7 mL via OROMUCOSAL

## 2015-08-12 NOTE — Care Management (Signed)
Barrier: Patient is to transfer to the icu due to need for milrinone infusion for hies heart failure

## 2015-08-12 NOTE — Progress Notes (Signed)
Subjective:  It appears that the patient will be transferred over to the critical care unit for inotropic therapy. His renal function is slightly worse today but this is to be expected in the setting of severe heart failure with contrast exposure. He remains a bit lethargic and bedbound at the moment. The patient's wife is at the bedside.   Objective:  Vital signs in last 24 hours:  Temp:  [97.5 F (36.4 C)-98.1 F (36.7 C)] 98 F (36.7 C) (07/26 0417) Pulse Rate:  [75-88] 76 (07/26 1127) Resp:  [18-22] 21 (07/26 0417) BP: (112-126)/(72-93) 119/78 (07/26 0934) SpO2:  [94 %-100 %] 100 % (07/26 1127) Weight:  [72.1 kg (159 lb)-72.6 kg (160 lb 1.6 oz)] 72.6 kg (160 lb 1.6 oz) (07/26 0417)  Weight change: -0.227 kg (-8 oz) Filed Weights   08/11/15 0432 08/11/15 1229 08/12/15 0417  Weight: 72.3 kg (159 lb 8 oz) 72.1 kg (159 lb) 72.6 kg (160 lb 1.6 oz)    Intake/Output:    Intake/Output Summary (Last 24 hours) at 08/12/15 1132 Last data filed at 08/12/15 0900  Gross per 24 hour  Intake              240 ml  Output              550 ml  Net             -310 ml     Physical Exam: General: Laying in bed, NAD.   HEENT Anicteric. Moist mucus membranes.  Neck Supple.  Pulm/lungs Normal effort, minimal basilar rales  CVS/Heart Irregular rhythm.  No rub/gallop.  Abdomen:  Soft, non distended, non tender.  Extremities: No edema.  Neurologic: Awake, alert, oriented.  Skin: No acute rashes.           Basic Metabolic Panel:   Recent Labs Lab 08/08/15 0632 08/08/15 1304 08/09/15 0555 08/10/15 0549 08/11/15 0521 08/12/15 0309  NA 137  --  138 139 138 135  K 3.6  --  4.0 3.9 4.1 4.5  CL 103  --  103 102 101 97*  CO2 26  --  26 27 28 25   GLUCOSE 91  --  91 95 96 98  BUN 35*  --  34* 30* 32* 39*  CREATININE 1.31*  --  1.16 1.18 1.26* 1.39*  CALCIUM 8.3*  --  8.5* 8.7* 8.6* 8.6*  MG  --  1.7 1.6* 1.9 1.8  --   PHOS 2.4*  --   --   --   --   --      CBC:  Recent  Labs Lab 08/06/15 1031 08/07/15 0243 08/11/15 0521 08/12/15 0309  WBC 7.8 7.1 7.6 7.6  HGB 11.2* 11.1* 11.5* 11.8*  HCT 32.9* 33.1* 33.9* 36.1*  MCV 96.4 97.4 96.4 97.6  PLT 130* 123* 109* 120*      Microbiology:  Recent Results (from the past 720 hour(s))  C difficile quick scan w PCR reflex     Status: None   Collection Time: 08/06/15 11:39 AM  Result Value Ref Range Status   C Diff antigen NEGATIVE NEGATIVE Final   C Diff toxin NEGATIVE NEGATIVE Final   C Diff interpretation No C. difficile detected.  Final    Coagulation Studies: No results for input(s): LABPROT, INR in the last 72 hours.  Urinalysis: No results for input(s): COLORURINE, LABSPEC, PHURINE, GLUCOSEU, HGBUR, BILIRUBINUR, KETONESUR, PROTEINUR, UROBILINOGEN, NITRITE, LEUKOCYTESUR in the last 72 hours.  Invalid input(s): APPERANCEUR  Imaging: No results found.   Medications:   . milrinone     . carvedilol  6.25 mg Oral BID WC  . enoxaparin (LOVENOX) injection  40 mg Subcutaneous Q24H  . folic acid  1 mg Oral Daily  . furosemide  60 mg Intravenous BID  . hydroxychloroquine  200 mg Oral BID  . isosorbide mononitrate  30 mg Oral Daily  . leflunomide  20 mg Oral Daily  . levothyroxine  75 mcg Oral QAC breakfast  . mirtazapine  7.5 mg Oral QHS  . mometasone-formoterol  2 puff Inhalation BID  . montelukast  10 mg Oral Daily  . multivitamin with minerals   Oral Daily  . pantoprazole  40 mg Oral Daily  . simvastatin  40 mg Oral Daily  . sodium chloride flush  3 mL Intravenous Q12H  . sodium chloride flush  3 mL Intravenous Q12H   sodium chloride, acetaminophen **OR** acetaminophen, ipratropium-albuterol, nitroGLYCERIN, ondansetron **OR** ondansetron (ZOFRAN) IV, phenol, polyethylene glycol, sodium chloride flush  Assessment/ Plan:  82 y.o. male with medical problems of COPD requiring home oxygen, rheumatoid arthritis, hypertension, severe coronary disease with five-vessel CABG in 1995,  congestive heart failure with EF 35-40%, pacemaker, AICD, history of goiter, history of renal stones in the remote past, who was admitted to [facility] on 08/06/2015 for evaluation of shortness of breath that started about a week ago.   1. Acute renal failure - Likely secondary to abnormal cardiorenal hemodynamics - Renal ultrasound s unremarkable for obstruction -Renal function is a bit worse today but this is to be expected in the setting of poor cardiac output and contrast exposure. We cannot administer IV fluids given severe heart failure.  2. Shortness of breath  Appears to have some signs of ongoing volume overload. The patient has been started on Lasix infusion. We will need to monitor renal function closely.  3. COPD Requires Home oxygen   4. CAD/Severe systolic CHF. - As per cardiology.  Patient being moved to critical care unit for inotropic therapy. He may possibly be transferred to [facility].   LOS: 6 Broc Caspers 7/26/201711:32 AM

## 2015-08-12 NOTE — Progress Notes (Signed)
Dr.Arida notified pt experienced a 7 beat run of VTach. Dr.Arida acknowledged, also stated he was not on for the hospital today but, that it was expected and that there was no need to call [place], [place].. Will continue to assess.

## 2015-08-12 NOTE — Progress Notes (Signed)
ELECTROLYTE CONSULT NOTE - Follow up  Pharmacy Consult for Electrolyte monitoring and replacement Indication: Lasix drip  No Known Allergies  Patient Measurements: Height: 5\' 8"  (172.7 cm) Weight: 160 lb 1.6 oz (72.6 kg) IBW/kg (Calculated) : 68.4  Vital Signs: Temp: 98 F (36.7 C) (07/26 0417) Temp Source: Axillary (07/26 0417) BP: 119/78 (07/26 0934) Pulse Rate: 86 (07/26 0934) Intake/Output from previous day: 07/25 0701 - 07/26 0700 In: -  Out: 550 [Urine:550] Intake/Output from this shift: Total I/O In: 240 [P.O.:240] Out: -   Labs:  Recent Labs  08/11/15 0521 08/12/15 0309  WBC 7.6 7.6  HGB 11.5* 11.8*  HCT 33.9* 36.1*  PLT 109* 120*     Recent Labs  08/10/15 0549 08/11/15 0521 08/12/15 0309  NA 139 138 135  K 3.9 4.1 4.5  CL 102 101 97*  CO2 27 28 25   GLUCOSE 95 96 98  BUN 30* 32* 39*  CREATININE 1.18 1.26* 1.39*  CALCIUM 8.7* 8.6* 8.6*  MG 1.9 1.8  --    Estimated Creatinine Clearance: 41 mL/min (by C-G formula based on SCr of 1.39 mg/dL).   No results for input(s): GLUCAP in the last 72 hours.   Assessment: 82 yo male with AECHF on lasix. Current orders for lasix 60mg  IV Q12H.  Plan:  Current orders for lasix 60mg  IV Q12H, potassium PO daily. Renal function worsening. K: 4.5  Will discontinue daily potassium at this time (has already received today's dose) and supplement daily as indicated. Recheck electrolytes with AM labs.   96, PharmD Clinical Pharmacist  08/12/2015 11:20 AM

## 2015-08-12 NOTE — Progress Notes (Signed)
PT Cancellation Note  Patient Details Name: Aqeel Norgaard MRN: [id number] DOB: 05/31/1932   Cancelled Treatment:    Reason Eval/Treat Not Completed: Medical issues which prohibited therapy. Order received, chart reviewed. Per RN, pt is pending transfer to the ICU for medication change/monitoring. Will re-attempt PT eval at later time if pt remains on unit.    Yonna Alwin, SPT 08/12/2015, 9:32 AM

## 2015-08-12 NOTE — Progress Notes (Addendum)
[employer] - [place] at [facility]   PATIENT NAME: Tom Nguyen    MR#:  [id number]  DATE OF BIRTH:  15-Jan-1933  SUBJECTIVE:    Patient seen this morning he is reporting some shortness of breath especially if he moves but no chest pain.   REVIEW OF SYSTEMS:    Review of Systems  Constitutional: Negative.  Negative for chills, fever and malaise/fatigue.  HENT: Negative.  Negative for ear discharge, ear pain, hearing loss, nosebleeds and sore throat.   Eyes: Negative.  Negative for blurred vision and pain.  Respiratory: Positive for shortness of breath. Negative for cough, hemoptysis and wheezing.   Cardiovascular: Negative.  Negative for chest pain, palpitations and leg swelling.  Gastrointestinal: Negative.  Negative for abdominal pain, blood in stool, diarrhea, nausea and vomiting.  Genitourinary: Negative.  Negative for dysuria.  Musculoskeletal: Negative.  Negative for back pain.  Skin: Negative.   Neurological: Negative for dizziness, tremors, speech change, focal weakness, seizures and headaches.  Endo/Heme/Allergies: Negative.  Does not bruise/bleed easily.  Psychiatric/Behavioral: Negative for depression, hallucinations and suicidal ideas.    Tolerating Diet: yes    DRUG ALLERGIES:  No Known Allergies  VITALS:  Blood pressure 119/78, pulse 86, temperature 98 F (36.7 C), temperature source Axillary, resp. rate (!) 21, height 5\' 8"  (1.727 m), weight 72.6 kg (160 lb 1.6 oz), SpO2 100 %.  PHYSICAL EXAMINATION:   Physical Exam  Constitutional: He is oriented to person, place, and time. No distress.  Frail tired  HENT:  Head: Normocephalic.  Eyes: No scleral icterus.  Neck: Normal range of motion. Neck supple. No JVD present. No tracheal deviation present.  Cardiovascular: Normal rate, regular rhythm and normal heart sounds.  Exam reveals no gallop and no friction rub.   No murmur heard. Pulmonary/Chest: Breath sounds normal. No respiratory  distress. He has no wheezes. He has no rales. He exhibits no tenderness.  Abdominal: Soft. Bowel sounds are normal. He exhibits no distension and no mass. There is no tenderness. There is no rebound and no guarding.  Musculoskeletal: Normal range of motion.  Neurological: He is alert and oriented to person, place, and time.  Skin: Skin is warm. No rash noted. No erythema.  Psychiatric: Affect and judgment normal.      LABORATORY PANEL:   CBC  Recent Labs Lab 08/12/15 0309  WBC 7.6  HGB 11.8*  HCT 36.1*  PLT 120*   ------------------------------------------------------------------------------------------------------------------  Chemistries   Recent Labs Lab 08/06/15 1031  08/11/15 0521 08/12/15 0309  NA 138  < > 138 135  K 4.1  < > 4.1 4.5  CL 102  < > 101 97*  CO2 26  < > 28 25  GLUCOSE 103*  < > 96 98  BUN 40*  < > 32* 39*  CREATININE 1.99*  < > 1.26* 1.39*  CALCIUM 8.4*  < > 8.6* 8.6*  MG  --   < > 1.8  --   AST 86*  --   --   --   ALT 125*  --   --   --   ALKPHOS 109  --   --   --   BILITOT 1.3*  --   --   --   < > = values in this interval not displayed. ------------------------------------------------------------------------------------------------------------------  Cardiac Enzymes  Recent Labs Lab 08/09/15 1112 08/09/15 1632 08/09/15 2321  TROPONINI 0.04* 0.05* 0.05*   ------------------------------------------------------------------------------------------------------------------  RADIOLOGY:  No results found.   ASSESSMENT AND PLAN:  82 year old male with systolic heart failure EF of 25-30% presents with acute on chronic systolic heart failure.   1. Acute on chronic Combined diastolic and systolic heart failure: Ejection fraction is now 25-30%. Patient underwent right heart and left heart cardiac catheterization on July 25 which shows moderate to severe pulmonary hypertension with a PA pressure of 55 and a PCWP of 32 mm Hg. Severely  reduced cardiac output of 2.95 with a cardiac index of 1.59. LHC showed significant underlying 3-vessel CAD with patent VG to diagonal and VG to RPDA. SVG to LCx was occluded. Native LAD with 70% proximal heavily calcified stenosis. As per cardiology recommendations are to start inotropic therapy due to persistent pulmonary edema. Patient transferred to ICU and started on Milrinone.   2. Acute kidney injury: This is due to abnormal cardiorenal hemodynamics. Creatinine increased from yesterday. Continue to monitor closely.  Renal ultrasound unremarkable for hydronephrosis/obstruction  3. 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 Continue duo nebs and inhalers.  4. Chronic diarrhea: Patient follows up with GI. C. difficile negative.  5. CAD status post CABG: Status post cardiac catheterization 7/25: Significant underlying three-vessel coronary artery disease with patent SVG to diagonal and SVG to right PDA. SVG to left circumflex is occluded. Native LAD has 70% proximal heavily calcified stenosis. Continue isosorbide, beta blocker and aspirin.   6. Hyperlipidemia: Continue statin.  7. Status post ICD/CRRT: Patient will follow up with Dr.klein as an outpatient to make sure device optimized as per recommendations by cardiology.  Management plans discussed with the patient  and he is in agreement.  CODE STATUS: full  CRITICAL CARE TOTAL TIME TAKING CARE OF THIS PATIENT: 29 minutes.  Discussed with cardiology Patient on inotropic agent for persistent  CHF and high risk for cardiac respiratory failure  POSSIBLE D/C ???, DEPENDING ON CLINICAL CONDITION/creatinine/cath.   Arienna Benegas M.D on 08/12/2015 at 10:58 AM  Between 7am to 6pm - Pager - [phone] After 6pm go to [email or web address] - password [email or web address]  [facility]  Office  [phone]  CC: Primary care physician; [name], DO  Note: This dictation was prepared  with Dragon dictation along with smaller phrase technology. Any transcriptional errors that result from this process are unintentional.

## 2015-08-12 NOTE — Progress Notes (Signed)
Pt is in stable condition at this time. Pt has had no complaints of pain at this time. Wife at bedside. Pt has been given welcome packet for the unit. Report given to Renee,RN.

## 2015-08-12 NOTE — Progress Notes (Signed)
Pt arrived to ICU in stable condition. A&O x4. No complaints of pain. Pt is AV paced in the 70's, Bp is WDL, and tachypnic. Oxygen levels are stable at 100 % on 3L [place].   Milrinone started at 0.18mcg/kg/min.  Will continue to assess.

## 2015-08-12 NOTE — Progress Notes (Signed)
Patient: Tom Nguyen / Admit Date: 08/06/2015 / Date of Encounter: 08/12/2015, 9:06 AM   Subjective: Status post R/LHC with RHC showing moderate to severe pulmonary hypertension with a PA pressure of 55 and a PCWP of 32 mm Hg. Severely reduced cardiac output of 2.95 with a cardiac index of 1.59. LHC showed significant underlying 3-vessel CAD with patent VG to diagonal and VG to RPDA. SVG to LCx was occluded. Native LAD with 70% proximal heavily calcified stenosis. Severely reduced LV systolic function by echo. Total UOP of 5.3 L for the admission on IV Lasix gtt.   Review of Systems: Review of Systems  Constitutional: Positive for malaise/fatigue. Negative for chills, diaphoresis, fever and weight loss.  HENT: Negative for congestion.   Eyes: Negative for discharge and redness.  Respiratory: Positive for cough and shortness of breath. Negative for sputum production and wheezing.   Cardiovascular: Positive for leg swelling. Negative for chest pain, palpitations, orthopnea, claudication and PND.  Gastrointestinal: Negative for abdominal pain, heartburn, nausea and vomiting.  Musculoskeletal: Negative for falls and myalgias.  Skin: Negative for rash.  Neurological: Positive for weakness. Negative for dizziness, tingling, tremors, sensory change, speech change, focal weakness and loss of consciousness.  Endo/Heme/Allergies: Does not bruise/bleed easily.  Psychiatric/Behavioral: Negative for substance abuse. The patient is not nervous/anxious.     Objective: Telemetry: Paced, 80's bpm Physical Exam: Blood pressure 126/87, pulse 78, temperature 98 F (36.7 C), temperature source Axillary, resp. rate (!) 21, height 5\' 8"  (1.727 m), weight 160 lb 1.6 oz (72.6 kg), SpO2 94 %. Body mass index is 24.34 kg/m. General: Well developed, well nourished, in no acute distress. Head: Normocephalic, atraumatic, sclera non-icteric, no xanthomas, nares are without discharge. Neck: Negative for carotid  bruits. JVP not elevated. Lungs: Crackles. Breathing is unlabored. Heart: RRR S1 S2 without murmurs, rubs, or gallops.  Abdomen: Soft, non-tender, non-distended with normoactive bowel sounds. No rebound/guarding. Extremities: No clubbing or cyanosis. No edema. Distal pedal pulses are 2+ and equal bilaterally. Neuro: Alert and oriented X 3. Moves all extremities spontaneously. Psych:  Responds to questions appropriately with a normal affect.   Intake/Output Summary (Last 24 hours) at 08/12/15 0906 Last data filed at 08/12/15 08/14/15  Gross per 24 hour  Intake                0 ml  Output              550 ml  Net             -550 ml    Inpatient Medications:  . carvedilol  6.25 mg Oral BID WC  . enoxaparin (LOVENOX) injection  40 mg Subcutaneous Q24H  . folic acid  1 mg Oral Daily  . furosemide  60 mg Intravenous BID  . hydroxychloroquine  200 mg Oral BID  . isosorbide mononitrate  30 mg Oral Daily  . leflunomide  20 mg Oral Daily  . levothyroxine  75 mcg Oral QAC breakfast  . mirtazapine  7.5 mg Oral QHS  . mometasone-formoterol  2 puff Inhalation BID  . montelukast  10 mg Oral Daily  . multivitamin with minerals   Oral Daily  . pantoprazole  40 mg Oral Daily  . potassium chloride  20 mEq Oral Daily  . simvastatin  40 mg Oral Daily  . sodium chloride flush  3 mL Intravenous Q12H  . sodium chloride flush  3 mL Intravenous Q12H   Infusions:    Labs:  Recent Labs  08/10/15 0549 08/11/15 0521 08/12/15 0309  NA 139 138 135  K 3.9 4.1 4.5  CL 102 101 97*  CO2 27 28 25   GLUCOSE 95 96 98  BUN 30* 32* 39*  CREATININE 1.18 1.26* 1.39*  CALCIUM 8.7* 8.6* 8.6*  MG 1.9 1.8  --    No results for input(s): AST, ALT, ALKPHOS, BILITOT, PROT, ALBUMIN in the last 72 hours.  Recent Labs  08/11/15 0521 08/12/15 0309  WBC 7.6 7.6  HGB 11.5* 11.8*  HCT 33.9* 36.1*  MCV 96.4 97.6  PLT 109* 120*    Recent Labs  08/09/15 1112 08/09/15 1632 08/09/15 2321  TROPONINI 0.04*  0.05* 0.05*   Invalid input(s): POCBNP No results for input(s): HGBA1C in the last 72 hours.   Weights: Filed Weights   08/11/15 0432 08/11/15 1229 08/12/15 0417  Weight: 159 lb 8 oz (72.3 kg) 159 lb (72.1 kg) 160 lb 1.6 oz (72.6 kg)     Radiology/Studies:  Dg Chest 1 View  Result Date: 08/09/2015 CLINICAL DATA:  Came in on 08-06-15 with SOB. Hx of CHF-follow up. Former smoker. Hx of HTN, COPD, CAD, Asthma, MI, Pacemaker, CABG. EXAM: CHEST 1 VIEW COMPARISON:  08/06/2015 FINDINGS: Pacer/AICD device. Bilateral shoulder arthroplasty. Midline trachea. Cardiomegaly accentuated by AP portable technique. Atherosclerosis in the transverse aorta. Trace right pleural fluid or thickening, increased. No pneumothorax. Moderate interstitial edema, increased. This is asymmetric, greater on the right. Worsened right base aeration, favoring progressive atelectasis. IMPRESSION: Moderate congestive heart failure with increase in small right pleural effusion. Developing right base airspace disease, favoring atelectasis. Aortic atherosclerosis. Electronically Signed   By: 08/08/2015 M.D.   On: 08/09/2015 12:11  08/11/2015 Renal  Result Date: 08/07/2015 CLINICAL DATA:  Acute renal failure. EXAM: RENAL / URINARY TRACT ULTRASOUND COMPLETE COMPARISON:  None. FINDINGS: Right Kidney: Length: 9.3 cm. Slightly increased echogenicity is noted. No mass or hydronephrosis visualized. Left Kidney: Length: 11.5 cm. Multiple cysts are noted, with the largest measuring 2.1 cm in upper pole. Slightly increased echogenicity is noted. No mass or hydronephrosis visualized. Bladder: Appears normal for degree of bladder distention. Bilateral ureteral jets are noted. IMPRESSION: Simple left renal cysts. No hydronephrosis or renal obstruction is noted. Slightly increased echogenicity of renal parenchyma bilaterally is noted suggesting medical renal disease. Minimal ascites is noted incidentally. Electronically Signed   By: 08/09/2015, M.D.    On: 08/07/2015 10:53   Dg Chest Portable 1 View  Result Date: 08/06/2015 CLINICAL DATA:  Shortness of breath for the last week, worsening today. EXAM: PORTABLE CHEST 1 VIEW COMPARISON:  04/14/2015 FINDINGS: Pacemaker defibrillator remains in place. Previous median sternotomy. Chronic cardiomegaly. Chronic aortic atherosclerosis. Small amount of pleural fluid. Venous hypertension with question early interstitial edema. IMPRESSION: Cardiomegaly. Question venous hypertension with early interstitial edema. Electronically Signed   By: 04/16/2015 M.D.   On: 08/06/2015 12:56     Assessment and Plan  Principal Problem:   Acute respiratory failure with hypoxia (HCC) - secondary to combined COPD and acute on chronic heart failure Active Problems:   Acute on chronic renal failure (HCC)   Pulmonary HTN (HCC)   Centrilobular emphysema (HCC)   Acute on chronic combined systolic and diastolic CHF, NYHA class 2 (HCC)   Diarrhea   Coronary artery disease involving coronary bypass graft of native heart without angina pectoris   Cardiomyopathy, ischemic   Cardiorenal syndrome with renal failure - improving with IV Lasix   ARF (acute renal failure) (HCC)  Combined systolic and diastolic congestive heart failure (HCC)   Elevated troponin    1. Acute on chronic systolic CHF: -Worsening LV systolic function with an EF of 25-30%, previously 40-45% in October 2016 although his LV systolic function has been 28% in the paste -R/LHC as above  -Remains volume overloaded despite IV Lasix infusion -Case was discussed with [facility] MD in [place] who recommend starting milrinone 0.25 mcg/kg/min -Restart IV Lasix for diuresis on milrinone as above -Continue Coreg and plan to start ARB in a day or so with the plan to transition to Entresto  3. CAD: -Status post CABG in 1995. Given worsening LV systolic function and presentation with heart failure he underwent R/LHC on 7/25 as above -No chest pain -Aspirin, beta  blocker, statin  3. Status post ICD/CRT: -Recommend outpatient follow up with Dr. Graciela Husbands, MD -Optimize medical therapy as above  4. HLD: -Continue simvastatin    Signed, Eula Listen, PA-C [facility] Pager: [phone] 08/12/2015, 9:06 AM

## 2015-08-13 DIAGNOSIS — Z7189 Other specified counseling: Secondary | ICD-10-CM

## 2015-08-13 DIAGNOSIS — Z515 Encounter for palliative care: Secondary | ICD-10-CM

## 2015-08-13 LAB — CBC
HCT: 30.7 % — ABNORMAL LOW (ref 40.0–52.0)
HEMOGLOBIN: 10.4 g/dL — AB (ref 13.0–18.0)
MCH: 32.2 pg (ref 26.0–34.0)
MCHC: 33.9 g/dL (ref 32.0–36.0)
MCV: 95.1 fL (ref 80.0–100.0)
Platelets: 116 10*3/uL — ABNORMAL LOW (ref 150–440)
RBC: 3.23 MIL/uL — ABNORMAL LOW (ref 4.40–5.90)
RDW: 18.5 % — ABNORMAL HIGH (ref 11.5–14.5)
WBC: 8.2 10*3/uL (ref 3.8–10.6)

## 2015-08-13 LAB — BASIC METABOLIC PANEL
ANION GAP: 10 (ref 5–15)
BUN: 38 mg/dL — AB (ref 6–20)
CALCIUM: 8.4 mg/dL — AB (ref 8.9–10.3)
CO2: 30 mmol/L (ref 22–32)
CREATININE: 1.24 mg/dL (ref 0.61–1.24)
Chloride: 97 mmol/L — ABNORMAL LOW (ref 101–111)
GFR calc Af Amer: >60 mL/min (ref >60)
GFR calc non Af Amer: 53 mL/min — ABNORMAL LOW (ref >60)
GLUCOSE: 91 mg/dL (ref 65–99)
Potassium: 3.4 mmol/L — ABNORMAL LOW (ref 3.5–5.1)
Sodium: 137 mmol/L (ref 135–145)

## 2015-08-13 LAB — MAGNESIUM: MAGNESIUM: 1.8 mg/dL (ref 1.7–2.4)

## 2015-08-13 MED ORDER — MORPHINE SULFATE (PF) 2 MG/ML IV SOLN
0.5000 mg | INTRAVENOUS | Status: DC | PRN
  Administered 2015-08-13 (×2): 0.5 mg via INTRAVENOUS
  Filled 2015-08-13 (×2): qty 1

## 2015-08-13 MED ORDER — AMIODARONE HCL IN DEXTROSE 360-4.14 MG/200ML-% IV SOLN
30.0000 mg/h | INTRAVENOUS | Status: DC
  Administered 2015-08-13 – 2015-08-16 (×6): 30 mg/h via INTRAVENOUS
  Filled 2015-08-13 (×15): qty 200

## 2015-08-13 MED ORDER — CARVEDILOL 3.125 MG PO TABS
3.1250 mg | ORAL_TABLET | Freq: Two times a day (BID) | ORAL | Status: DC
  Administered 2015-08-13 – 2015-08-18 (×5): 3.125 mg via ORAL
  Filled 2015-08-13 (×7): qty 1

## 2015-08-13 MED ORDER — POTASSIUM CHLORIDE CRYS ER 20 MEQ PO TBCR
40.0000 meq | EXTENDED_RELEASE_TABLET | Freq: Two times a day (BID) | ORAL | Status: AC
  Administered 2015-08-13 (×2): 40 meq via ORAL
  Filled 2015-08-13 (×2): qty 2

## 2015-08-13 MED ORDER — MAGNESIUM OXIDE 400 (241.3 MG) MG PO TABS
400.0000 mg | ORAL_TABLET | Freq: Every day | ORAL | Status: DC
  Administered 2015-08-13 – 2015-08-18 (×6): 400 mg via ORAL
  Filled 2015-08-13 (×6): qty 1

## 2015-08-13 MED ORDER — AMIODARONE HCL IN DEXTROSE 360-4.14 MG/200ML-% IV SOLN
60.0000 mg/h | INTRAVENOUS | Status: DC
  Administered 2015-08-13: 60 mg/h via INTRAVENOUS
  Filled 2015-08-13: qty 200

## 2015-08-13 MED ORDER — AMIODARONE LOAD VIA INFUSION
150.0000 mg | Freq: Once | INTRAVENOUS | Status: AC
  Administered 2015-08-13: 150 mg via INTRAVENOUS
  Filled 2015-08-13: qty 83.34

## 2015-08-13 NOTE — Progress Notes (Signed)
Daughter Judeth Cornfield called and requested to be called first if anything should happen to father.  Number is on contact list and is [phone].

## 2015-08-13 NOTE — Progress Notes (Signed)
Subjective:  Patient seen at bedside. Creatinine slightly better at 1.2. Remains on inotropic agents. Good urine output noted.  Objective:  Vital signs in last 24 hours:  Temp:  [97.8 F (36.6 C)-98.3 F (36.8 C)] 97.9 F (36.6 C) (07/27 0749) Pulse Rate:  [55-119] 116 (07/27 0900) Resp:  [14-27] 22 (07/27 0900) BP: (89-121)/(62-85) 95/72 (07/27 0900) SpO2:  [98 %-100 %] 100 % (07/27 0900) Weight:  [74 kg (163 lb 2.3 oz)-76.3 kg (168 lb 3.4 oz)] 76.3 kg (168 lb 3.4 oz) (07/27 0522)  Weight change: 1.878 kg (4 lb 2.3 oz) Filed Weights   08/12/15 0417 08/12/15 1217 08/13/15 0522  Weight: 72.6 kg (160 lb 1.6 oz) 74 kg (163 lb 2.3 oz) 76.3 kg (168 lb 3.4 oz)    Intake/Output:    Intake/Output Summary (Last 24 hours) at 08/13/15 0941 Last data filed at 08/13/15 0900  Gross per 24 hour  Intake           114.17 ml  Output             1225 ml  Net         -1110.83 ml     Physical Exam: General: Laying in bed, NAD.   HEENT Anicteric. Moist mucus membranes.  Neck Supple.  Pulm/lungs Normal effort, minimal basilar rales  CVS/Heart Irregular rhythm.  No rub/gallop.  Abdomen:  Soft, non distended, non tender.  Extremities: No edema.  Neurologic: Awake, alert, oriented.  Skin: No acute rashes.           Basic Metabolic Panel:   Recent Labs Lab 08/08/15 0632 08/08/15 1304 08/09/15 0555 08/10/15 0549 08/11/15 0521 08/12/15 0309 08/13/15 0525  NA 137  --  138 139 138 135 137  K 3.6  --  4.0 3.9 4.1 4.5 3.4*  CL 103  --  103 102 101 97* 97*  CO2 26  --  26 27 28 25 30   GLUCOSE 91  --  91 95 96 98 91  BUN 35*  --  34* 30* 32* 39* 38*  CREATININE 1.31*  --  1.16 1.18 1.26* 1.39* 1.24  CALCIUM 8.3*  --  8.5* 8.7* 8.6* 8.6* 8.4*  MG  --  1.7 1.6* 1.9 1.8  --  1.8  PHOS 2.4*  --   --   --   --   --   --      CBC:  Recent Labs Lab 08/06/15 1031 08/07/15 0243 08/11/15 0521 08/12/15 0309 08/13/15 0525  WBC 7.8 7.1 7.6 7.6 8.2  HGB 11.2* 11.1* 11.5* 11.8*  10.4*  HCT 32.9* 33.1* 33.9* 36.1* 30.7*  MCV 96.4 97.4 96.4 97.6 95.1  PLT 130* 123* 109* 120* 116*      Microbiology:  Recent Results (from the past 720 hour(s))  C difficile quick scan w PCR reflex     Status: None   Collection Time: 08/06/15 11:39 AM  Result Value Ref Range Status   C Diff antigen NEGATIVE NEGATIVE Final   C Diff toxin NEGATIVE NEGATIVE Final   C Diff interpretation No C. difficile detected.  Final  MRSA PCR Screening     Status: None   Collection Time: 08/12/15 12:30 PM  Result Value Ref Range Status   MRSA by PCR NEGATIVE NEGATIVE Final    Comment:        The GeneXpert MRSA Assay (FDA approved for NASAL specimens only), is one component of a comprehensive MRSA colonization surveillance program. It is not intended to  diagnose MRSA infection nor to guide or monitor treatment for MRSA infections.     Coagulation Studies: No results for input(s): LABPROT, INR in the last 72 hours.  Urinalysis: No results for input(s): COLORURINE, LABSPEC, PHURINE, GLUCOSEU, HGBUR, BILIRUBINUR, KETONESUR, PROTEINUR, UROBILINOGEN, NITRITE, LEUKOCYTESUR in the last 72 hours.  Invalid input(s): APPERANCEUR    Imaging: Dg Chest Port 1 View  Result Date: 08/12/2015 CLINICAL DATA:  Status post PICC line placement EXAM: PORTABLE CHEST 1 VIEW COMPARISON:  08/09/2015 FINDINGS: Cardiac shadow remains enlarged. A defibrillator is again seen and stable. A new right-sided PICC line is noted with catheter tip just below the cavoatrial junction in satisfactory position. Postsurgical changes are noted. The lungs are well aerated bilaterally with minimal thickening of the right minor fissure. No focal infiltrate is seen. IMPRESSION: PICC line as described in satisfactory position. Electronically Signed   By: Alcide Clever M.D.   On: 08/12/2015 17:10    Medications:   . milrinone 0.25 mcg/kg/min (08/13/15 0900)   . antiseptic oral rinse  7 mL Mouth Rinse BID  . carvedilol  3.125  mg Oral BID WC  . enoxaparin (LOVENOX) injection  40 mg Subcutaneous Q24H  . folic acid  1 mg Oral Daily  . furosemide  60 mg Intravenous BID  . hydroxychloroquine  200 mg Oral BID  . leflunomide  20 mg Oral Daily  . levothyroxine  75 mcg Oral QAC breakfast  . magnesium oxide  400 mg Oral Daily  . mirtazapine  7.5 mg Oral QHS  . mometasone-formoterol  2 puff Inhalation BID  . montelukast  10 mg Oral Daily  . multivitamin with minerals   Oral Daily  . pantoprazole  40 mg Oral Daily  . potassium chloride  40 mEq Oral BID  . simvastatin  40 mg Oral Daily  . sodium chloride flush  3 mL Intravenous Q12H  . sodium chloride flush  3 mL Intravenous Q12H   sodium chloride, acetaminophen **OR** acetaminophen, ipratropium-albuterol, nitroGLYCERIN, ondansetron **OR** ondansetron (ZOFRAN) IV, phenol, polyethylene glycol, sodium chloride flush  Assessment/ Plan:  82 y.o. male with medical problems of COPD requiring home oxygen, rheumatoid arthritis, hypertension, severe coronary disease with five-vessel CABG in 1995, congestive heart failure with EF 35-40%, pacemaker, AICD, history of goiter, history of renal stones in the remote past, who was admitted to [facility] on 08/06/2015 for evaluation of shortness of breath that started about a week ago.   1. Acute renal failure - Likely secondary to abnormal cardiorenal hemodynamics - Renal ultrasound unremarkable for obstruction -Renal function improve with inotropic support. Continue to monitor renal function trend as well as urine output  2. Shortness of breath  Improve with continued use of Lasix and milrinone. Continue to clinically monitor.  3. COPD Requires Home oxygen   4. CAD/Severe systolic CHF. - As per cardiology.  Patient appears to have benefited from milrinone as well as Lasix drip. Continue inotropic support as well as diuresis.   LOS: 7 Brynlynn Walko 7/27/20179:41 AM

## 2015-08-13 NOTE — Progress Notes (Signed)
ELECTROLYTE CONSULT NOTE - Follow up  Pharmacy Consult for Electrolyte monitoring and replacement Indication: Lasix drip  No Known Allergies  Patient Measurements: Height: 5\' 8"  (172.7 cm) Weight: 168 lb 3.4 oz (76.3 kg) IBW/kg (Calculated) : 68.4  Vital Signs: Temp: 97.9 F (36.6 C) (07/27 0749) Temp Source: Axillary (07/27 0749) BP: 95/72 (07/27 0900) Pulse Rate: 116 (07/27 0900) Intake/Output from previous day: 07/26 0701 - 07/27 0700 In: 343.4 [P.O.:240; I.V.:103.4] Out: 950 [Urine:950] Intake/Output from this shift: Total I/O In: 16.2 [I.V.:16.2] Out: 275 [Urine:275]  Labs:  Recent Labs  08/11/15 0521 08/12/15 0309 08/13/15 0525  WBC 7.6 7.6 8.2  HGB 11.5* 11.8* 10.4*  HCT 33.9* 36.1* 30.7*  PLT 109* 120* 116*     Recent Labs  08/11/15 0521 08/12/15 0309 08/13/15 0525  NA 138 135 137  K 4.1 4.5 3.4*  CL 101 97* 97*  CO2 28 25 30   GLUCOSE 96 98 91  BUN 32* 39* 38*  CREATININE 1.26* 1.39* 1.24  CALCIUM 8.6* 8.6* 8.4*  MG 1.8  --  1.8   Estimated Creatinine Clearance: 46 mL/min (by C-G formula based on SCr of 1.24 mg/dL).    Recent Labs  08/12/15 1216  GLUCAP 137*     Assessment: 82 yo male with AECHF on lasix. Current orders for lasix 60mg  IV Q12H.  Plan:  Potassium replaced with KCl 40 meq po x 2 today. Would suggest adding daily scheduled potassium while on Lasix.   08/14/15, PharmD Clinical Pharmacist  08/13/2015 11:24 AM

## 2015-08-13 NOTE — Progress Notes (Signed)
[employer] - [place] [facility]   PATIENT NAME: Tom Nguyen    MR#:  [id number]  DATE OF BIRTH:  [birth year]  SUBJECTIVE:  Rested better. Denies chest pain. Still with some shortness of breath on exertion.  Patient had frequent PVCs/episodes of nonsustained V. tach   REVIEW OF SYSTEMS:    Review of Systems  Constitutional: Negative.  Negative for chills, fever and malaise/fatigue.  HENT: Negative.  Negative for ear discharge, ear pain, hearing loss, nosebleeds and sore throat.   Eyes: Negative.  Negative for blurred vision and pain.  Respiratory: Positive for shortness of breath. Negative for cough, hemoptysis and wheezing.   Cardiovascular: Negative.  Negative for chest pain, palpitations and leg swelling.  Gastrointestinal: Negative.  Negative for abdominal pain, blood in stool, diarrhea, nausea and vomiting.  Genitourinary: Negative.  Negative for dysuria.  Musculoskeletal: Negative.  Negative for back pain.  Skin: Negative.   Neurological: Negative for dizziness, tremors, speech change, focal weakness, seizures and headaches.  Endo/Heme/Allergies: Negative.  Does not bruise/bleed easily.  Psychiatric/Behavioral: Negative for depression, hallucinations and suicidal ideas.    Tolerating Diet: yes    DRUG ALLERGIES:  No Known Allergies  VITALS:  Blood pressure 95/72, pulse (!) 116, temperature 97.9 F (36.6 C), temperature source Axillary, resp. rate (!) 22, height 5\' 8"  (1.727 m), weight 76.3 kg (168 lb 3.4 oz), SpO2 100 %.  PHYSICAL EXAMINATION:   Physical Exam  Constitutional: He is oriented to person, place, and time. No distress.  Frail tired  HENT:  Head: Normocephalic.  Eyes: No scleral icterus.  Neck: Normal range of motion. Neck supple. No JVD present. No tracheal deviation present.  Cardiovascular: Normal rate, regular rhythm and normal heart sounds.  Exam reveals no gallop and no friction rub.   No murmur heard. Pulmonary/Chest:  Breath sounds normal. No respiratory distress. He has no wheezes. He has no rales. He exhibits no tenderness.  Abdominal: Soft. Bowel sounds are normal. He exhibits no distension and no mass. There is no tenderness. There is no rebound and no guarding.  Musculoskeletal: Normal range of motion.  Neurological: He is alert and oriented to person, place, and time.  Skin: Skin is warm. No rash noted. No erythema.  Psychiatric: Affect and judgment normal.      LABORATORY PANEL:   CBC  Recent Labs Lab 08/13/15 0525  WBC 8.2  HGB 10.4*  HCT 30.7*  PLT 116*   ------------------------------------------------------------------------------------------------------------------  Chemistries   Recent Labs Lab 08/06/15 1031  08/13/15 0525  NA 138  < > 137  K 4.1  < > 3.4*  CL 102  < > 97*  CO2 26  < > 30  GLUCOSE 103*  < > 91  BUN 40*  < > 38*  CREATININE 1.99*  < > 1.24  CALCIUM 8.4*  < > 8.4*  MG  --   < > 1.8  AST 86*  --   --   ALT 125*  --   --   ALKPHOS 109  --   --   BILITOT 1.3*  --   --   < > = values in this interval not displayed. ------------------------------------------------------------------------------------------------------------------  Cardiac Enzymes  Recent Labs Lab 08/09/15 1112 08/09/15 1632 08/09/15 2321  TROPONINI 0.04* 0.05* 0.05*   ------------------------------------------------------------------------------------------------------------------  RADIOLOGY:  Dg Chest Port 1 View  Result Date: 08/12/2015 CLINICAL DATA:  Status post PICC line placement EXAM: PORTABLE CHEST 1 VIEW COMPARISON:  08/09/2015 FINDINGS: Cardiac shadow remains enlarged. A  defibrillator is again seen and stable. A new right-sided PICC line is noted with catheter tip just below the cavoatrial junction in satisfactory position. Postsurgical changes are noted. The lungs are well aerated bilaterally with minimal thickening of the right minor fissure. No focal infiltrate is seen.  IMPRESSION: PICC line as described in satisfactory position. Electronically Signed   By: Alcide Clever M.D.   On: 08/12/2015 17:10    ASSESSMENT AND PLAN:   82 year old male with systolic heart failure EF of 25-30% presents with acute on chronic systolic heart failure.   1. Acute on chronic Combined diastolic and systolic heart failure: Ejection fraction is now 25-30%. Patient underwent right heart and left heart cardiac catheterization on July 25 which shows moderate to severe pulmonary hypertension with a PA pressure of 55 and a PCWP of 32 mm Hg. Severely reduced cardiac output of 2.95 with a cardiac index of 1.59. LHC showed significant underlying 3-vessel CAD with patent VG to diagonal and VG to RPDA. SVG to LCx was occluded. Native LAD with 70% proximal heavily calcified stenosis.  Patient remains in fluid overload.  He was transferred to ICU and started on Milrinone 7/26. Continue low-dose Coreg and IV Lasix. ACE inhibitor if creatinine remains stable and blood pressure tolerates  2. Acute kidney injury: This is due to abnormal cardiorenal hemodynamics. Creatinine has improved with diuresis.  Renal ultrasound unremarkable for hydronephrosis/obstruction  3. 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 Continue duo nebs and inhalers.  4. Chronic diarrhea: Patient follows up with GI. C. difficile negative.  5. CAD status post CABG: Status post cardiac catheterization 7/25: Significant underlying three-vessel coronary artery disease with patent SVG to diagonal and SVG to right PDA. SVG to left circumflex is occluded. Native LAD has 70% proximal heavily calcified stenosis. Continue Coreg and aspirin.   6. Hyperlipidemia: Continue statin.  7. Status post ICD/CRRT: Patient with frequent PVCs , may benefit from device interrogation and starting amiodarone.     Management plans discussed with the patient  and he is in agreement.  CODE  STATUS: full  CRITICAL CARE TOTAL TIME TAKING CARE OF THIS PATIENT: 30 minutes.  Discuss with cardiology Patient on inotropic agent for persistent  CHF and high risk for cardiac respiratory failure, critically ill Palliative care consult. POSSIBLE D/C ???, DEPENDING ON CLINICAL CONDITION/creatinine/cath.   Ewing Fandino M.D on 08/13/2015 at 9:54 AM  Between 7am to 6pm - Pager - [phone] After 6pm go to [email or web address] - password [email or web address]  [facility]  Office  [phone]  CC: Primary care physician; [name], DO  Note: This dictation was prepared with Dragon dictation along with smaller phrase technology. Any transcriptional errors that result from this process are unintentional.

## 2015-08-13 NOTE — Progress Notes (Signed)
Major decisions made by patient and family.

## 2015-08-13 NOTE — Consult Note (Signed)
Consultation Note Date: 08/13/2015   Patient Name: Tom Nguyen  DOB: August 23, 1932  MRN: [id number]  Age / Sex: 82 y.o., male  PCP: [name], DO Referring Physician: Adrian Saran, MD  Reason for Consultation: Establishing goals of care and Psychosocial/spiritual support  HPI/Patient Profile: 82 y.o. male  admitted on 08/06/2015 with a known history of CAD status post CABG, ischemic cardiomyopathy with ejection fraction of 45% from 2016, with ICD,  severe emphysema on 2 L home oxygen, chronic diarrhea, hypothyroidism, known left bundle branch block, sleep apnea presents to the hospital secondary to worsening shortness of breath.  Per family patient has been declining significantly both physically and functionally over the past year.  Currently with Acute on chronic systolic heart failure: Worsening LV systolic function with an EF of 25-30% On Milrinone gtt at this time.   Patient and family faced with advanced directive decisions, and anticipatory care needs      Clinical Assessment and Goals of Care:  This NP Lorinda Creed reviewed medical records, received report from team, assessed the patient and then meet at the patient's bedside along with his wife  to discuss diagnosis, prognosis, GOC, EOL wishes disposition and options.   A detailed discussion was had today regarding advanced directives.  Concepts specific to code status, artifical feeding and hydration, continued IV antibiotics and rehospitalization was had.  The difference between a aggressive medical intervention path  and a palliative comfort care path for this patient at this time was had.  Values and goals of care important to patient and family were attempted to be elicited.  Concept of Hospice and Palliative Care were discussed  Natural trajectory and expectations at EOL were discussed.  Questions and concerns addressed.  Hard Choices booklet  left for review. Family encouraged to call with questions or concerns.  PMT will continue to support holistically.  I spoke  to daughter/ Ginger Carne to update on today's conversation.  She expresses concerns over treatment options as they relate to her father's poor functional status over the past year.  Mr Mammie Russian and his wife were able to verbalize their thoughts, feelings and concerns facing mortality and limitations of medical interventions.   SUMMARY OF RECOMMENDATIONS    Code Status/Advance Care Planning:  Limited code-- encouraged to consider de-activation of ICD    Symptom Management:   Dyspnea: Moprhine 0.5 mg IV evry 3 hrs prn--  Palliative Prophylaxis:   Aspiration, Bowel Regimen, Delirium Protocol, Frequent Pain Assessment and Oral Care  Additional Recommendations (Limitations, Scope, Preferences):  At this time treat the treatable, patient and family remain hopeful for improvement.   Psycho-social/Spiritual:   Desire for further Chaplaincy support:yes  Additional Recommendations: Education on Hospice  Prognosis:  - Long term poor prognosis  Discharge Planning: To Be Determined      Primary Diagnoses: Present on Admission: . Acute on chronic renal failure (HCC) . Diarrhea . Coronary artery disease involving coronary bypass graft of native heart without angina pectoris . Pulmonary HTN (HCC) . Centrilobular emphysema (HCC) .  Acute on chronic combined systolic and diastolic CHF, NYHA class 2 (HCC) . Cardiomyopathy, ischemic . Acute respiratory failure with hypoxia (HCC) - secondary to combined COPD and acute on chronic heart failure . Cardiorenal syndrome with renal failure - improving with IV Lasix   I have reviewed the medical record, interviewed the patient and family, and examined the patient. The following aspects are pertinent.  Past Medical History:  Diagnosis Date  . Arthritis   . Asthma   . CAD (coronary artery disease)    a. s/p CABG  1995; b. stress test 2006: inf post lat infarction w/ peri-infarct ischemia, AK inferior wall & HK inferolat wall, EF 28%. unchanged from prior study 07/2002.  Marland Kitchen Chronic combined systolic and diastolic CHF, NYHA class 3 (HCC)    a. 2007 EF 40%; b. 10/2008: EF 35% by echo; c. echo 10/16: EF 45-50%, sev inf/post wall HK, conduction abnormality, mild MR, mod dilated LA, PASP 47 mm Hg  . Chronic diarrhea   . Chronic respiratory failure (HCC)   . COPD (chronic obstructive pulmonary disease) (HCC)    on 2L o2 at home  . Emphysema of lung (HCC)   . History of GI bleed   . Hyperlipidemia   . Hypertension   . Hypothyroid   . Ischemic cardiomyopathy   . LBBB (left bundle branch block)   . MI (myocardial infarction) (HCC) 1995  . Sleep apnea    a. on CPAP   Social History   Social History  . Marital status: Married    Spouse name: N/A  . Number of children: N/A  . Years of education: N/A   Social History Main Topics  . Smoking status: Former Smoker    Quit date: 02/15/1993  . Smokeless tobacco: Never Used     Comment: Quit in 1995  . Alcohol use 0.0 - 0.6 oz/week     Comment: occasional  . Drug use: No  . Sexual activity: Not Asked   Other Topics Concern  . None   Social History Narrative   Lives at home with wife, has both cane and a walker.   Mobility limited due to breathing/dyspnea   Family History  Problem Relation Age of Onset  . Hyperlipidemia Father   . Stroke Father   . Hypertension Father   . Kidney disease Father   . Kidney failure Mother     Was on dialysis   Scheduled Meds: . amiodarone  150 mg Intravenous Once  . antiseptic oral rinse  7 mL Mouth Rinse BID  . carvedilol  3.125 mg Oral BID WC  . enoxaparin (LOVENOX) injection  40 mg Subcutaneous Q24H  . folic acid  1 mg Oral Daily  . furosemide  60 mg Intravenous BID  . hydroxychloroquine  200 mg Oral BID  . leflunomide  20 mg Oral Daily  . levothyroxine  75 mcg Oral QAC breakfast  . magnesium oxide  400  mg Oral Daily  . mirtazapine  7.5 mg Oral QHS  . mometasone-formoterol  2 puff Inhalation BID  . montelukast  10 mg Oral Daily  . multivitamin with minerals   Oral Daily  . pantoprazole  40 mg Oral Daily  . potassium chloride  40 mEq Oral BID  . simvastatin  40 mg Oral Daily  . sodium chloride flush  3 mL Intravenous Q12H  . sodium chloride flush  3 mL Intravenous Q12H   Continuous Infusions: . amiodarone     Followed by  . amiodarone    .  milrinone 0.25 mcg/kg/min (08/13/15 1000)   PRN Meds:.sodium chloride, acetaminophen **OR** acetaminophen, ipratropium-albuterol, nitroGLYCERIN, ondansetron **OR** ondansetron (ZOFRAN) IV, phenol, polyethylene glycol, sodium chloride flush Medications Prior to Admission:  Prior to Admission medications   Medication Sig Start Date End Date Taking? Authorizing Provider  albuterol (PROVENTIL HFA;VENTOLIN HFA) 108 (90 Base) MCG/ACT inhaler Inhale 2 puffs into the lungs every 4 (four) hours as needed for wheezing or shortness of breath. 03/05/15  Yes Erin Fulling, MD  albuterol (PROVENTIL) (2.5 MG/3ML) 0.083% nebulizer solution Take 3 mLs (2.5 mg total) by nebulization every 4 (four) hours. For wheezing/SOB 03/05/15  Yes Erin Fulling, MD  carvedilol (COREG) 12.5 MG tablet Take 1 tablet (12.5 mg total) by mouth 2 (two) times daily with a meal. 06/17/15  Yes [name], DO  levothyroxine (SYNTHROID, LEVOTHROID) 75 MCG tablet Take 1 tablet (75 mcg total) by mouth daily before breakfast. 06/12/15  Yes [name], DO  lisinopril (PRINIVIL,ZESTRIL) 20 MG tablet Take 1 tablet (20 mg total) by mouth daily. 06/17/15  Yes [name], DO  omeprazole (PRILOSEC) 20 MG capsule Take 1 capsule (20 mg total) by mouth daily. 06/17/15  Yes Jayce G Cook, DO  tiotropium (SPIRIVA) 18 MCG inhalation capsule Place 18 mcg into inhaler and inhale daily.   Yes Historical Provider, MD  acetaminophen (TYLENOL) 500 MG tablet Take 500 mg by mouth every 6 (six) hours as needed.    Historical  Provider, MD  alendronate (FOSAMAX) 70 MG tablet Take 70 mg by mouth once a week.  04/08/15   Historical Provider, MD  allopurinol (ZYLOPRIM) 100 MG tablet Take 100 mg by mouth daily.  08/25/14   Historical Provider, MD  eplerenone (INSPRA) 50 MG tablet Take 50 mg by mouth daily.  08/12/14   Historical Provider, MD  folic acid (FOLVITE) 1 MG tablet Take 1 mg by mouth daily.  08/25/14   Historical Provider, MD  furosemide (LASIX) 80 MG tablet Take 80 mg by mouth daily.  09/17/14   Historical Provider, MD  hydroxychloroquine (PLAQUENIL) 200 MG tablet Take 400 mg by mouth daily.  09/26/14   Historical Provider, MD  leflunomide (ARAVA) 20 MG tablet Take 20 mg by mouth daily.  08/25/14   Historical Provider, MD  montelukast (SINGULAIR) 10 MG tablet Take 10 mg by mouth daily.  01/25/15   Historical Provider, MD  Multiple Vitamins-Minerals (MULTIVITAMIN ADULT PO) Take 1 tablet by mouth daily.     Historical Provider, MD  nitroGLYCERIN (NITROLINGUAL) 0.4 MG/SPRAY spray Place 1 spray under the tongue every 5 (five) minutes x 3 doses as needed for chest pain.    Historical Provider, MD  Potassium Chloride ER 20 MEQ TBCR Take 20 mEq by mouth daily. 06/17/15   [name], DO  simvastatin (ZOCOR) 40 MG tablet Take 1 tablet (40 mg total) by mouth daily. 07/01/15   Jayce G Cook, DO  Umeclidinium-Vilanterol (ANORO ELLIPTA) 62.5-25 MCG/INH AEPB Inhale 1 puff into the lungs daily. 12/04/14   Merwyn Katos, MD   No Known Allergies Review of Systems  Constitutional: Positive for fatigue.  Respiratory: Positive for shortness of breath.   Neurological: Positive for weakness.    Physical Exam  Constitutional: He appears lethargic. He appears ill.  HENT:  Mouth/Throat: Oropharynx is clear and moist.  Cardiovascular: Tachycardia present.   Pulmonary/Chest: Tachypnea noted. He has decreased breath sounds in the right lower field and the left lower field.  Neurological: He appears lethargic.    Vital Signs: BP  95/72 (BP  Location: Left Arm)   Pulse (!) 116   Temp 97.9 F (36.6 C) (Axillary)   Resp (!) 22   Ht 5\' 8"  (1.727 m)   Wt 76.3 kg (168 lb 3.4 oz)   SpO2 100%   BMI 25.58 kg/m  Pain Assessment: No/denies pain POSS *See Group Information*: S-Acceptable,Sleep, easy to arouse Pain Score: 0-No pain   SpO2: SpO2: 100 % O2 Device:SpO2: 100 % O2 Flow Rate: .O2 Flow Rate (L/min): 2 L/min  IO: Intake/output summary:  Intake/Output Summary (Last 24 hours) at 08/13/15 1152 Last data filed at 08/13/15 1000  Gross per 24 hour  Intake           119.57 ml  Output             1225 ml  Net         -1105.43 ml    LBM: Last BM Date: 08/12/15 Baseline Weight: Weight: 75.3 kg (166 lb) Most recent weight: Weight: 76.3 kg (168 lb 3.4 oz)      Palliative Assessment/Data: 30% at best   Discussed with Dr Juliene Pina  Time In: 1120 Time Out: 1245 Time Total: 75 min Greater than 50%  of this time was spent counseling and coordinating care related to the above assessment and plan.  Signed by: Lorinda Creed, NP   Please contact Palliative Medicine Team phone at [phone] for questions and concerns.  For individual provider: See Loretha Stapler

## 2015-08-13 NOTE — Progress Notes (Signed)
Patient: Tom Nguyen / Admit Date: 08/06/2015 / Date of Encounter: 08/13/2015, 8:01 AM   Subjective: Feeling much better since transferring to the ICU and starting milrinone infusion. No further angina decubitus. SOB improving. Has diuresed 750 mL overnight and is 6 L negative for the admission. Status post PICC line on 7/26.   Review of Systems: Review of Systems  Constitutional: Positive for malaise/fatigue. Negative for chills, diaphoresis, fever and weight loss.  HENT: Negative for congestion.   Eyes: Negative for discharge and redness.  Respiratory: Positive for cough and shortness of breath. Negative for sputum production and wheezing.   Cardiovascular: Negative for chest pain, palpitations, orthopnea, claudication, leg swelling and PND.  Gastrointestinal: Negative for abdominal pain, heartburn, nausea and vomiting.  Musculoskeletal: Negative for falls and myalgias.  Skin: Negative for rash.  Neurological: Positive for weakness. Negative for dizziness, tingling, tremors, sensory change, speech change, focal weakness and loss of consciousness.  Endo/Heme/Allergies: Does not bruise/bleed easily.  Psychiatric/Behavioral: Negative for substance abuse. The patient is not nervous/anxious.   All other systems reviewed and are negative.   Objective: Telemetry: V paced, 7 beats of NSVT on 7/26 Physical Exam: Blood pressure 104/62, pulse (!) 119, temperature 97.9 F (36.6 C), temperature source Axillary, resp. rate 15, height 5\' 8"  (1.727 m), weight 168 lb 3.4 oz (76.3 kg), SpO2 99 %. Body mass index is 25.58 kg/m. General: Frail appearing, in no acute distress. Head: Normocephalic, atraumatic, sclera non-icteric, no xanthomas, nares are without discharge. Neck: Negative for carotid bruits. JVP not elevated. Lungs: Faint crackles along the bilateral bases.  Heart: RRR S1 S2 without murmurs, rubs, or gallops.  Abdomen: Soft, non-tender, non-distended with normoactive bowel sounds. No  rebound/guarding. Extremities: No clubbing or cyanosis. No edema. Distal pedal pulses are 2+ and equal bilaterally. Neuro: Alert and oriented X 3. Moves all extremities spontaneously. Psych:  Responds to questions appropriately with a normal affect.   Intake/Output Summary (Last 24 hours) at 08/13/15 0801 Last data filed at 08/13/15 0500  Gross per 24 hour  Intake           332.57 ml  Output              950 ml  Net          -617.43 ml    Inpatient Medications:  . antiseptic oral rinse  7 mL Mouth Rinse BID  . carvedilol  6.25 mg Oral BID WC  . enoxaparin (LOVENOX) injection  40 mg Subcutaneous Q24H  . folic acid  1 mg Oral Daily  . furosemide  60 mg Intravenous BID  . hydroxychloroquine  200 mg Oral BID  . isosorbide mononitrate  30 mg Oral Daily  . leflunomide  20 mg Oral Daily  . levothyroxine  75 mcg Oral QAC breakfast  . mirtazapine  7.5 mg Oral QHS  . mometasone-formoterol  2 puff Inhalation BID  . montelukast  10 mg Oral Daily  . multivitamin with minerals   Oral Daily  . pantoprazole  40 mg Oral Daily  . simvastatin  40 mg Oral Daily  . sodium chloride flush  3 mL Intravenous Q12H  . sodium chloride flush  3 mL Intravenous Q12H   Infusions:  . milrinone 0.25 mcg/kg/min (08/13/15 0400)    Labs:  Recent Labs  08/11/15 0521 08/12/15 0309 08/13/15 0525  NA 138 135 137  K 4.1 4.5 3.4*  CL 101 97* 97*  CO2 28 25 30   GLUCOSE 96 98 91  BUN  32* 39* 38*  CREATININE 1.26* 1.39* 1.24  CALCIUM 8.6* 8.6* 8.4*  MG 1.8  --  1.8   No results for input(s): AST, ALT, ALKPHOS, BILITOT, PROT, ALBUMIN in the last 72 hours.  Recent Labs  08/12/15 0309 08/13/15 0525  WBC 7.6 8.2  HGB 11.8* 10.4*  HCT 36.1* 30.7*  MCV 97.6 95.1  PLT 120* 116*   No results for input(s): CKTOTAL, CKMB, TROPONINI in the last 72 hours. Invalid input(s): POCBNP No results for input(s): HGBA1C in the last 72 hours.   Weights: Filed Weights   08/12/15 0417 08/12/15 1217 08/13/15 0522    Weight: 160 lb 1.6 oz (72.6 kg) 163 lb 2.3 oz (74 kg) 168 lb 3.4 oz (76.3 kg)     Radiology/Studies:  Dg Chest 1 View  Result Date: 08/09/2015 CLINICAL DATA:  Came in on 08-06-15 with SOB. Hx of CHF-follow up. Former smoker. Hx of HTN, COPD, CAD, Asthma, MI, Pacemaker, CABG. EXAM: CHEST 1 VIEW COMPARISON:  08/06/2015 FINDINGS: Pacer/AICD device. Bilateral shoulder arthroplasty. Midline trachea. Cardiomegaly accentuated by AP portable technique. Atherosclerosis in the transverse aorta. Trace right pleural fluid or thickening, increased. No pneumothorax. Moderate interstitial edema, increased. This is asymmetric, greater on the right. Worsened right base aeration, favoring progressive atelectasis. IMPRESSION: Moderate congestive heart failure with increase in small right pleural effusion. Developing right base airspace disease, favoring atelectasis. Aortic atherosclerosis. Electronically Signed   By: Jeronimo Greaves M.D.   On: 08/09/2015 12:11  US Renal  Result Date: 08/07/2015 CLINICAL DATA:  Acute renal failure. EXAM: RENAL / URINARY TRACT ULTRASOUND COMPLETE COMPARISON:  None. FINDINGS: Right Kidney: Length: 9.3 cm. Slightly increased echogenicity is noted. No mass or hydronephrosis visualized. Left Kidney: Length: 11.5 cm. Multiple cysts are noted, with the largest measuring 2.1 cm in upper pole. Slightly increased echogenicity is noted. No mass or hydronephrosis visualized. Bladder: Appears normal for degree of bladder distention. Bilateral ureteral jets are noted. IMPRESSION: Simple left renal cysts. No hydronephrosis or renal obstruction is noted. Slightly increased echogenicity of renal parenchyma bilaterally is noted suggesting medical renal disease. Minimal ascites is noted incidentally. Electronically Signed   By: Lupita Raider, M.D.   On: 08/07/2015 10:53   Dg Chest Port 1 View  Result Date: 08/12/2015 CLINICAL DATA:  Status post PICC line placement EXAM: PORTABLE CHEST 1 VIEW COMPARISON:   08/09/2015 FINDINGS: Cardiac shadow remains enlarged. A defibrillator is again seen and stable. A new right-sided PICC line is noted with catheter tip just below the cavoatrial junction in satisfactory position. Postsurgical changes are noted. The lungs are well aerated bilaterally with minimal thickening of the right minor fissure. No focal infiltrate is seen. IMPRESSION: PICC line as described in satisfactory position. Electronically Signed   By: Alcide Clever M.D.   On: 08/12/2015 17:10  Dg Chest Portable 1 View  Result Date: 08/06/2015 CLINICAL DATA:  Shortness of breath for the last week, worsening today. EXAM: PORTABLE CHEST 1 VIEW COMPARISON:  04/14/2015 FINDINGS: Pacemaker defibrillator remains in place. Previous median sternotomy. Chronic cardiomegaly. Chronic aortic atherosclerosis. Small amount of pleural fluid. Venous hypertension with question early interstitial edema. IMPRESSION: Cardiomegaly. Question venous hypertension with early interstitial edema. Electronically Signed   By: Paulina Fusi M.D.   On: 08/06/2015 12:56     Assessment and Plan  Principal Problem:   Acute respiratory failure with hypoxia (HCC) - secondary to combined COPD and acute on chronic heart failure Active Problems:   Acute on chronic renal failure (HCC)  Pulmonary HTN (HCC)   Centrilobular emphysema (HCC)   Acute on chronic combined systolic and diastolic CHF, NYHA class 2 (HCC)   Diarrhea   Coronary artery disease involving coronary bypass graft of native heart without angina pectoris   Cardiomyopathy, ischemic   Cardiorenal syndrome with renal failure - improving with IV Lasix   ARF (acute renal failure) (HCC)   Combined systolic and diastolic congestive heart failure (HCC)   Elevated troponin    1. Acute on chronic systolic CHF: -Worsening LV systolic function with an EF of 25-30%, previously 40-45% in October 2016 although his LV systolic function has been 28% in the paste -R/LHC on 7/25 showing  significantly elevated right heart pressures and his cardiomyopathy appearing to be out of proportion to his CAD -Transferred to ICU on 7/26 for initiation of milrinone infusion and was restarted on Lasix injections -Since transfer to the ICU he has had a UOP of 600 mL overnight with RN reporting 750 mL -Total UOP for the admission is 6 L  -Remains volume overloaded and very symptomatic with minimal exertion  -Will need several days of inotropic support for diuresis  -If he does not improve in the next day or so will transfer to [facility] for advanced heart failure treatment -Continue Coreg and plan to start ARB in a day or so with the plan to transition to [facility] as he becomes Nguyen euvolemic -He has had short runs of tachycardic heart rate, V paced, as well as 7 beats of NSVT -He is s/p ICD and is on beta blocker -Given his cardiomyopathy ventricular ectopy is expected -Given frequent tachycardic pacing, underlying rhythm uncertain without interrogation, consider amiodarone. Have notified MD regarding this -May need device interrogated  -Decrease Coreg to 3/125 mg bid given soft BP and hold Imdur  3. CAD: -Status post CABG in 1995. Given worsening LV systolic function and presentation with heart failure he underwent R/LHC on 7/25 as above -No chest pain -Aspirin, beta blocker, statin  3. Status post ICD/CRT: -Recommend outpatient follow up with Dr. Graciela Husbands, MD -Optimize medical therapy as above  4. HLD: -Continue simvastatin   5. Electrolyte abnormalities: -Replete KCl to 4.0 and magnesium to 2.0  6. Dispo: -Consider palliative care consult for goals of care   Signed, [facility] Pager: [phone] 08/13/2015, 8:01 AM     Attending Note Patient seen and examined, agree with detailed note above,  Patient presentation and plan discussed on rounds.   Tolerating no known infusion, Blood pressure high 90s systolic Sleeping this morning but family  reports he is feeling better Significant urine output in the past 24 hours on milrinone, 1 L Family reports less shortness of breath, Nguyen comfortable than on arrival  Previous catheterization results reviewed with family Three-vessel native coronary disease, occluded vein graft to the circumflex, aorta calcified LAD Severely reduced ejection fraction Consistent with ischemic cardiomyopathy No good options for revascularization  It was noted that he had episodes of tachycardia during the cardiac catheterization Review of telemetry over the past 12 hours shows that he continues to have episodes of atrial arrhythmia He is asymptomatic  On exam Decreased inspiratory effort, scant crackles at the bases, heart sounds irregular, murmur appreciated, abdomen soft nontender, no significant leg edema  Lab work with improved renal function creatinine 1.2  ---Agree with plan above, Would continue milrinone with diuresis given improved renal function   consider starting amiodarone infusion for tachycardia arrhythmias concerning for atrial fibrillation Low-dose Coreg Little room  for ACE inhibitor, arb or entresto at this time   Long discussion with family, all questions answered  [place] is guarded  Greater than 50% was spent in counseling and coordination of care with patient Total encounter time 35 minutes or Nguyen   Signed: Dossie Arbour  M.D., Ph.D. [facility]

## 2015-08-14 LAB — BASIC METABOLIC PANEL
Anion gap: 8 (ref 5–15)
BUN: 41 mg/dL — AB (ref 6–20)
CALCIUM: 8.5 mg/dL — AB (ref 8.9–10.3)
CO2: 29 mmol/L (ref 22–32)
Chloride: 96 mmol/L — ABNORMAL LOW (ref 101–111)
Creatinine, Ser: 1.44 mg/dL — ABNORMAL HIGH (ref 0.61–1.24)
GFR calc Af Amer: 51 mL/min — ABNORMAL LOW (ref >60)
GFR, EST NON AFRICAN AMERICAN: 44 mL/min — AB (ref >60)
GLUCOSE: 122 mg/dL — AB (ref 65–99)
Potassium: 4.3 mmol/L (ref 3.5–5.1)
SODIUM: 133 mmol/L — AB (ref 135–145)

## 2015-08-14 LAB — MAGNESIUM: Magnesium: 1.9 mg/dL (ref 1.7–2.4)

## 2015-08-14 MED ORDER — LISINOPRIL 5 MG PO TABS: 5.0000 mg | ORAL_TABLET | Freq: Every day | ORAL | Status: DC

## 2015-08-14 MED ORDER — FUROSEMIDE 10 MG/ML IJ SOLN
60.0000 mg | Freq: Three times a day (TID) | INTRAMUSCULAR | Status: AC
  Administered 2015-08-14 – 2015-08-16 (×8): 60 mg via INTRAVENOUS
  Filled 2015-08-14 (×8): qty 6

## 2015-08-14 MED ORDER — CALCIUM CARBONATE ANTACID 500 MG PO CHEW
1.0000 | CHEWABLE_TABLET | Freq: Once | ORAL | Status: AC
  Administered 2015-08-14: 200 mg via ORAL
  Filled 2015-08-14: qty 1

## 2015-08-14 MED ORDER — LOSARTAN POTASSIUM 25 MG PO TABS
25.0000 mg | ORAL_TABLET | Freq: Every day | ORAL | Status: DC
  Administered 2015-08-14 – 2015-08-18 (×3): 25 mg via ORAL
  Filled 2015-08-14 (×4): qty 1

## 2015-08-14 MED ORDER — ALTEPLASE 2 MG IJ SOLR
2.0000 mg | Freq: Once | INTRAMUSCULAR | Status: AC
  Administered 2015-08-14: 2 mg
  Filled 2015-08-14: qty 2

## 2015-08-14 NOTE — Progress Notes (Signed)
    RN reports patient's family really does not want transfer to [facility] given family dynamics. He continues to be volume overloaded requiring IV diuresis with the help of milrinone gtt. Total UOP 6.2 L for the admission that began 1 week ago. Over the past 24 hours he has diuresed only 197 mL on the above regimen. His renal function is slightly increased this morning, though still requires IV diuresis. If family does not want transfer and patient continues to feel poorly with minimal UOP with the above palliative care may need to discuss goals of care. Will discuss with MD.

## 2015-08-14 NOTE — Progress Notes (Signed)
Tom Nguyen notified that patient is still ordered 60 mg IV lasix BID.  BUN and Creatinine are increasing.  Acknowledged, no new orders to RN.

## 2015-08-14 NOTE — Progress Notes (Signed)
ELECTROLYTE CONSULT NOTE - Follow up  Pharmacy Consult for Electrolyte monitoring and replacement Indication: Lasix drip  No Known Allergies  Patient Measurements: Height: 5\' 8"  (172.7 cm) Weight: 167 lb 12.3 oz (76.1 kg) IBW/kg (Calculated) : 68.4  Vital Signs: Temp: 98.1 F (36.7 C) (07/28 0800) Temp Source: Axillary (07/28 0800) BP: 98/73 (07/28 0800) Pulse Rate: 62 (07/28 0800) Intake/Output from previous day: 07/27 0701 - 07/28 0700 In: 1102.7 [I.V.:1102.7] Out: 1300 [Urine:1300] Intake/Output from this shift: Total I/O In: 137.6 [P.O.:60; I.V.:77.6] Out: 175 [Urine:175]  Labs:  Recent Labs  08/12/15 0309 08/13/15 0525  WBC 7.6 8.2  HGB 11.8* 10.4*  HCT 36.1* 30.7*  PLT 120* 116*     Recent Labs  08/12/15 0309 08/13/15 0525 08/14/15 0555  NA 135 137 133*  K 4.5 3.4* 4.3  CL 97* 97* 96*  CO2 25 30 29   GLUCOSE 98 91 122*  BUN 39* 38* 41*  CREATININE 1.39* 1.24 1.44*  CALCIUM 8.6* 8.4* 8.5*  MG  --  1.8 1.9   Estimated Creatinine Clearance: 39.6 mL/min (by C-G formula based on SCr of 1.44 mg/dL).    Recent Labs  08/12/15 1216  GLUCAP 137*     Assessment: 82 yo male with AECHF on lasix. Current orders for lasix 60mg  IV Q12H.  Plan:  Electrolytes are WNL. Will continue to monitor potassium daily while on Lasix and milrinone and not on scheduled potassium supplement.  08/14/15, PharmD Clinical Pharmacist  08/14/2015 11:29 AM

## 2015-08-14 NOTE — Care Management (Signed)
Met with patient and his wife to discuss discharge planning. He seems discouraged today but wants to go home at discharge. Palliative gave information regarding hospice yesterday so this was not brought up during my visit. He is [facility] chronic O2 [facility]; portable tanks and a generator. He has rollator, front-wheeled walker, and wheelchair available at home. He declines need for hospital bed. He said that he is usually able to walk and mentioned ambulating stairs a few days ago and his wife agreed. He uses [facility] on [address] for Rx.  List of home health agencies left with patient and wife. RNCM will continue to follow. I understand that Delene Loll may be needed at discharge and RNCM will need to check that with [facility]; Rx would be helpful.

## 2015-08-14 NOTE — Progress Notes (Signed)
Patient: Tom Nguyen / Admit Date: 08/06/2015 / Date of Encounter: 08/14/2015, 11:03 AM   Subjective: Still SOB at rest, worse with minimal exertion in the bed. Renal function slightly increased to 1.4.   Review of Systems: ROS  Objective: Telemetry: Paced Physical Exam: Blood pressure 98/73, pulse 62, temperature 98.1 F (36.7 C), temperature source Axillary, resp. rate 15, height 5\' 8"  (1.727 m), weight 167 lb 12.3 oz (76.1 kg), SpO2 95 %. Body mass index is 25.51 kg/m. General: Frail appearing. Head: Normocephalic, atraumatic, sclera non-icteric, no xanthomas, nares are without discharge. Neck: Negative for carotid bruits. JVP not elevated. Lungs: Coarse breath sounds. Breathing is unlabored. Heart: RRR S1 S2 without murmurs, rubs, or gallops.  Abdomen: Soft, non-tender, non-distended with normoactive bowel sounds. No rebound/guarding. Extremities: No clubbing or cyanosis. No edema. Distal pedal pulses are 2+ and equal bilaterally. Neuro: Alert and oriented X 3. Moves all extremities spontaneously. Psych:  Responds to questions appropriately with a normal affect.   Intake/Output Summary (Last 24 hours) at 08/14/15 1103 Last data filed at 08/14/15 0900  Gross per 24 hour  Intake           1224.1 ml  Output             1200 ml  Net             24.1 ml    Inpatient Medications:  . antiseptic oral rinse  7 mL Mouth Rinse BID  . carvedilol  3.125 mg Oral BID WC  . enoxaparin (LOVENOX) injection  40 mg Subcutaneous Q24H  . folic acid  1 mg Oral Daily  . furosemide  60 mg Intravenous BID  . hydroxychloroquine  200 mg Oral BID  . leflunomide  20 mg Oral Daily  . levothyroxine  75 mcg Oral QAC breakfast  . magnesium oxide  400 mg Oral Daily  . mirtazapine  7.5 mg Oral QHS  . mometasone-formoterol  2 puff Inhalation BID  . montelukast  10 mg Oral Daily  . multivitamin with minerals   Oral Daily  . pantoprazole  40 mg Oral Daily  . simvastatin  40 mg Oral Daily  . sodium  chloride flush  3 mL Intravenous Q12H  . sodium chloride flush  3 mL Intravenous Q12H   Infusions:  . amiodarone 30 mg/hr (08/14/15 1019)  . milrinone 0.25 mcg/kg/min (08/14/15 0900)    Labs:  Recent Labs  08/13/15 0525 08/14/15 0555  NA 137 133*  K 3.4* 4.3  CL 97* 96*  CO2 30 29  GLUCOSE 91 122*  BUN 38* 41*  CREATININE 1.24 1.44*  CALCIUM 8.4* 8.5*  MG 1.8 1.9   No results for input(s): AST, ALT, ALKPHOS, BILITOT, PROT, ALBUMIN in the last 72 hours.  Recent Labs  08/12/15 0309 08/13/15 0525  WBC 7.6 8.2  HGB 11.8* 10.4*  HCT 36.1* 30.7*  MCV 97.6 95.1  PLT 120* 116*   No results for input(s): CKTOTAL, CKMB, TROPONINI in the last 72 hours. Invalid input(s): POCBNP No results for input(s): HGBA1C in the last 72 hours.   Weights: Filed Weights   08/12/15 1217 08/13/15 0522 08/14/15 0500  Weight: 163 lb 2.3 oz (74 kg) 168 lb 3.4 oz (76.3 kg) 167 lb 12.3 oz (76.1 kg)     Radiology/Studies:  Dg Chest 1 View  Result Date: 08/09/2015 CLINICAL DATA:  Came in on 08-06-15 with SOB. Hx of CHF-follow up. Former smoker. Hx of HTN, COPD, CAD, Asthma, MI, Pacemaker, CABG.  EXAM: CHEST 1 VIEW COMPARISON:  08/06/2015 FINDINGS: Pacer/AICD device. Bilateral shoulder arthroplasty. Midline trachea. Cardiomegaly accentuated by AP portable technique. Atherosclerosis in the transverse aorta. Trace right pleural fluid or thickening, increased. No pneumothorax. Moderate interstitial edema, increased. This is asymmetric, greater on the right. Worsened right base aeration, favoring progressive atelectasis. IMPRESSION: Moderate congestive heart failure with increase in small right pleural effusion. Developing right base airspace disease, favoring atelectasis. Aortic atherosclerosis. Electronically Signed   By: Jeronimo Greaves M.D.   On: 08/09/2015 12:11  US Renal  Result Date: 08/07/2015 CLINICAL DATA:  Acute renal failure. EXAM: RENAL / URINARY TRACT ULTRASOUND COMPLETE COMPARISON:  None.  FINDINGS: Right Kidney: Length: 9.3 cm. Slightly increased echogenicity is noted. No mass or hydronephrosis visualized. Left Kidney: Length: 11.5 cm. Multiple cysts are noted, with the largest measuring 2.1 cm in upper pole. Slightly increased echogenicity is noted. No mass or hydronephrosis visualized. Bladder: Appears normal for degree of bladder distention. Bilateral ureteral jets are noted. IMPRESSION: Simple left renal cysts. No hydronephrosis or renal obstruction is noted. Slightly increased echogenicity of renal parenchyma bilaterally is noted suggesting medical renal disease. Minimal ascites is noted incidentally. Electronically Signed   By: Lupita Raider, M.D.   On: 08/07/2015 10:53   Dg Chest Port 1 View  Result Date: 08/12/2015 CLINICAL DATA:  Status post PICC line placement EXAM: PORTABLE CHEST 1 VIEW COMPARISON:  08/09/2015 FINDINGS: Cardiac shadow remains enlarged. A defibrillator is again seen and stable. A new right-sided PICC line is noted with catheter tip just below the cavoatrial junction in satisfactory position. Postsurgical changes are noted. The lungs are well aerated bilaterally with minimal thickening of the right minor fissure. No focal infiltrate is seen. IMPRESSION: PICC line as described in satisfactory position. Electronically Signed   By: Alcide Clever M.D.   On: 08/12/2015 17:10  Dg Chest Portable 1 View  Result Date: 08/06/2015 CLINICAL DATA:  Shortness of breath for the last week, worsening today. EXAM: PORTABLE CHEST 1 VIEW COMPARISON:  04/14/2015 FINDINGS: Pacemaker defibrillator remains in place. Previous median sternotomy. Chronic cardiomegaly. Chronic aortic atherosclerosis. Small amount of pleural fluid. Venous hypertension with question early interstitial edema. IMPRESSION: Cardiomegaly. Question venous hypertension with early interstitial edema. Electronically Signed   By: Paulina Fusi M.D.   On: 08/06/2015 12:56     Assessment and Plan  Principal Problem:    Acute respiratory failure with hypoxia (HCC) - secondary to combined COPD and acute on chronic heart failure Active Problems:   Acute on chronic renal failure (HCC)   Pulmonary HTN (HCC)   Centrilobular emphysema (HCC)   Acute on chronic combined systolic and diastolic CHF, NYHA class 2 (HCC)   Diarrhea   Coronary artery disease involving coronary bypass graft of native heart without angina pectoris   Cardiomyopathy, ischemic   Cardiorenal syndrome with renal failure - improving with IV Lasix   ARF (acute renal failure) (HCC)   Combined systolic and diastolic congestive heart failure (HCC)   Elevated troponin   DNR (do not resuscitate) discussion   Palliative care encounter    1.  Signed, Eula Listen, PA-C [facility] Pager: [phone] 08/14/2015, 11:03 AM

## 2015-08-14 NOTE — Progress Notes (Signed)
MD on call paged to inform of patient PICC line becoming disconnected and had clotted. Was not able to flush or get blood return. Orders given to flush with Activase once.

## 2015-08-14 NOTE — Progress Notes (Signed)
[employer] [facility]   PATIENT NAME: Tom Nguyen    MR#:  [id number]  DATE OF BIRTH:  Aug 20, 1932  SUBJECTIVE:  Increased work of breathing, short of Breath this morning. Denies chest pain or chest pressure.  Wife at bedside, says she does not want to give up minutes of "fighter".   REVIEW OF SYSTEMS:    Review of Systems  Constitutional: Positive for malaise/fatigue. Negative for chills and fever.  HENT: Negative.  Negative for ear discharge, ear pain, hearing loss, nosebleeds and sore throat.   Eyes: Negative.  Negative for blurred vision and pain.  Respiratory: Positive for shortness of breath. Negative for cough, hemoptysis and wheezing.   Cardiovascular: Negative.  Negative for chest pain, palpitations and leg swelling.  Gastrointestinal: Negative.  Negative for abdominal pain, blood in stool, diarrhea, nausea and vomiting.  Genitourinary: Negative.  Negative for dysuria.  Musculoskeletal: Negative.  Negative for back pain.  Skin: Negative.   Neurological: Positive for weakness. Negative for dizziness, tremors, speech change, focal weakness, seizures and headaches.  Endo/Heme/Allergies: Negative.  Does not bruise/bleed easily.  Psychiatric/Behavioral: Negative for depression, hallucinations and suicidal ideas.    Tolerating Diet: Very little    DRUG ALLERGIES:  No Known Allergies  VITALS:  Blood pressure 98/73, pulse 62, temperature 98.1 F (36.7 C), temperature source Axillary, resp. rate 15, height 5\' 8"  (1.727 m), weight 76.1 kg (167 lb 12.3 oz), SpO2 95 %.  PHYSICAL EXAMINATION:   Physical Exam  Constitutional: He is oriented to person, place, and time. No distress.  Frail tired, Weak appearing  HENT:  Head: Normocephalic.  Eyes: No scleral icterus.  Neck: Normal range of motion. Neck supple. No JVD present. No tracheal deviation present.  Cardiovascular: Normal heart sounds.  Exam reveals no gallop and no friction rub.   No  murmur heard. irr with PVC  Pulmonary/Chest: Breath sounds normal. No respiratory distress. He has no wheezes. He has no rales. He exhibits no tenderness.  Abdominal: Soft. Bowel sounds are normal. He exhibits no distension and no mass. There is no tenderness. There is no rebound and no guarding.  Musculoskeletal:  Global weakness no focal deficit  Neurological: He is alert and oriented to person, place, and time.  Skin: Skin is warm. No rash noted. No erythema.  Psychiatric: Affect and judgment normal.      LABORATORY PANEL:   CBC  Recent Labs Lab 08/13/15 0525  WBC 8.2  HGB 10.4*  HCT 30.7*  PLT 116*   ------------------------------------------------------------------------------------------------------------------  Chemistries   Recent Labs Lab 08/14/15 0555  NA 133*  K 4.3  CL 96*  CO2 29  GLUCOSE 122*  BUN 41*  CREATININE 1.44*  CALCIUM 8.5*  MG 1.9   ------------------------------------------------------------------------------------------------------------------  Cardiac Enzymes  Recent Labs Lab 08/09/15 1112 08/09/15 1632 08/09/15 2321  TROPONINI 0.04* 0.05* 0.05*   ------------------------------------------------------------------------------------------------------------------  RADIOLOGY:  Dg Chest Port 1 View  Result Date: 08/12/2015 CLINICAL DATA:  Status post PICC line placement EXAM: PORTABLE CHEST 1 VIEW COMPARISON:  08/09/2015 FINDINGS: Cardiac shadow remains enlarged. A defibrillator is again seen and stable. A new right-sided PICC line is noted with catheter tip just below the cavoatrial junction in satisfactory position. Postsurgical changes are noted. The lungs are well aerated bilaterally with minimal thickening of the right minor fissure. No focal infiltrate is seen. IMPRESSION: PICC line as described in satisfactory position. Electronically Signed   By: 08/11/2015 M.D.   On: 08/12/2015 17:10  ASSESSMENT AND PLAN:   82 year old  male with systolic heart failure EF of 25-30% presents with acute on chronic systolic heart failure.   1. Acute on chronic Combined diastolic and systolic heart failure: Ejection fraction is now 25-30%. Patient underwent right heart and left heart cardiac catheterization on July 25 which shows moderate to severe pulmonary hypertension with a PA pressure of 55 and a PCWP of 32 mm Hg. Severely reduced cardiac output of 2.95 with a cardiac index of 1.59. LHC showed significant underlying 3-vessel CAD with patent VG to diagonal and VG to RPDA. SVG to LCx was occluded. Native LAD with 70% proximal heavily calcified stenosis.  Patient remains in fluid overload, with minimal diuresis.  He was transferred to ICU and started on inotropic support with Milrinone 7/26. Continue low-dose Coreg and IV Lasix. ACE inhibitor would be initiated however creatinine continues to fluctuate. Patient and family does not want transfer to [facility].  Appreciate cardiology consult.  2. Acute kidney injury: This is due to abnormal cardiorenal hemodynamics with underlying severe cardiomyopathy and CAD. Creatinine continues to fluctuate . Would not initiate ACE inhibitor at this time.  Renal ultrasound unremarkable for hydronephrosis/obstruction. Appreciate nephrology consult  3. 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 Continue duo nebs and inhalers.  4. Chronic diarrhea: Patient follows up with GI. C. difficile negative.  5. CAD status post CABG: Status post cardiac catheterization 7/25: Significant underlying three-vessel coronary artery disease with patent SVG to diagonal and SVG to right PDA. SVG to left circumflex is occluded. Native LAD has 70% proximal heavily calcified stenosis. Continue Coreg and aspirin. No good options for revascularization, as per cardiology.   6. Hyperlipidemia: Continue statin.  7. Status post ICD/CRRT: Patient with frequent PVCs. Started  on amiodarone drip. May need device interrogation.   Management plans discussed with the patient and family and he is in agreement.  CODE STATUS: full  CRITICAL CARE TOTAL TIME TAKING CARE OF THIS PATIENT: 50 minutes.    Extensive discussion today with patient's wife and patient about overall poor prognosis with minimal cardiac reserve and ongoing shortness of breath and fluid overload. Wife not ready to accept this. Appreciate palliative care consult.   Patient on inotropic agent for persistent  CHF and high risk for cardiac respiratory failure, critically ill   Limited code. POSSIBLE D/C ???, DEPENDING ON CLINICAL CONDITION/creatinine  Lyan Moyano M.D on 08/14/2015 at 11:21 AM  Between 7am to 6pm - Pager - [phone] After 6pm go to [email or web address] - password [email or web address]  [facility]  Office  [phone]  CC: Primary care physician; [name], DO  Note: This dictation was prepared with Dragon dictation along with smaller phrase technology. Any transcriptional errors that result from this process are unintentional.

## 2015-08-14 NOTE — Progress Notes (Signed)
Subjective:  Overall patient continues to appear quite weak. His movement has been very limited. BUN is currently up to 41 with a creatinine of 1.4. Patient remains on milrinone as well as Lasix.  Objective:  Vital signs in last 24 hours:  Temp:  [97.5 F (36.4 C)-98.7 F (37.1 C)] 98.1 F (36.7 C) (07/28 0800) Pulse Rate:  [62-78] 62 (07/28 0800) Resp:  [14-34] 15 (07/28 0800) BP: (93-132)/(54-86) 98/73 (07/28 0800) SpO2:  [95 %-100 %] 95 % (07/28 0800) Weight:  [76.1 kg (167 lb 12.3 oz)] 76.1 kg (167 lb 12.3 oz) (07/28 0500)  Weight change: 2.1 kg (4 lb 10.1 oz) Filed Weights   08/12/15 1217 08/13/15 0522 08/14/15 0500  Weight: 74 kg (163 lb 2.3 oz) 76.3 kg (168 lb 3.4 oz) 76.1 kg (167 lb 12.3 oz)    Intake/Output:    Intake/Output Summary (Last 24 hours) at 08/14/15 5625 Last data filed at 08/14/15 0800  Gross per 24 hour  Intake           1147.4 ml  Output             1200 ml  Net            -52.6 ml     Physical Exam: General: Laying in bed, NAD.   HEENT Anicteric. Moist mucus membranes.  Neck Supple.  Pulm/lungs Normal effort, minimal basilar rales  CVS/Heart Irregular rhythm.  No rub/gallop.  Abdomen:  Soft, non distended, non tender.  Extremities: No edema.  Neurologic: Awake, alert, oriented.  Skin: No acute rashes.           Basic Metabolic Panel:   Recent Labs Lab 08/08/15 6389  08/09/15 0555 08/10/15 0549 08/11/15 0521 08/12/15 0309 08/13/15 0525 08/14/15 0555  NA 137  --  138 139 138 135 137 133*  K 3.6  --  4.0 3.9 4.1 4.5 3.4* 4.3  CL 103  --  103 102 101 97* 97* 96*  CO2 26  --  26 27 28 25 30 29   GLUCOSE 91  --  91 95 96 98 91 122*  BUN 35*  --  34* 30* 32* 39* 38* 41*  CREATININE 1.31*  --  1.16 1.18 1.26* 1.39* 1.24 1.44*  CALCIUM 8.3*  --  8.5* 8.7* 8.6* 8.6* 8.4* 8.5*  MG  --   < > 1.6* 1.9 1.8  --  1.8 1.9  PHOS 2.4*  --   --   --   --   --   --   --   < > = values in this interval not displayed.   CBC:  Recent  Labs Lab 08/11/15 0521 08/12/15 0309 08/13/15 0525  WBC 7.6 7.6 8.2  HGB 11.5* 11.8* 10.4*  HCT 33.9* 36.1* 30.7*  MCV 96.4 97.6 95.1  PLT 109* 120* 116*      Microbiology:  Recent Results (from the past 720 hour(s))  C difficile quick scan w PCR reflex     Status: None   Collection Time: 08/06/15 11:39 AM  Result Value Ref Range Status   C Diff antigen NEGATIVE NEGATIVE Final   C Diff toxin NEGATIVE NEGATIVE Final   C Diff interpretation No C. difficile detected.  Final  MRSA PCR Screening     Status: None   Collection Time: 08/12/15 12:30 PM  Result Value Ref Range Status   MRSA by PCR NEGATIVE NEGATIVE Final    Comment:        The GeneXpert MRSA  Assay (FDA approved for NASAL specimens only), is one component of a comprehensive MRSA colonization surveillance program. It is not intended to diagnose MRSA infection nor to guide or monitor treatment for MRSA infections.     Coagulation Studies: No results for input(s): LABPROT, INR in the last 72 hours.  Urinalysis: No results for input(s): COLORURINE, LABSPEC, PHURINE, GLUCOSEU, HGBUR, BILIRUBINUR, KETONESUR, PROTEINUR, UROBILINOGEN, NITRITE, LEUKOCYTESUR in the last 72 hours.  Invalid input(s): APPERANCEUR    Imaging: Dg Chest Port 1 View  Result Date: 08/12/2015 CLINICAL DATA:  Status post PICC line placement EXAM: PORTABLE CHEST 1 VIEW COMPARISON:  08/09/2015 FINDINGS: Cardiac shadow remains enlarged. A defibrillator is again seen and stable. A new right-sided PICC line is noted with catheter tip just below the cavoatrial junction in satisfactory position. Postsurgical changes are noted. The lungs are well aerated bilaterally with minimal thickening of the right minor fissure. No focal infiltrate is seen. IMPRESSION: PICC line as described in satisfactory position. Electronically Signed   By: Alcide Clever M.D.   On: 08/12/2015 17:10    Medications:   . amiodarone 30 mg/hr (08/14/15 0800)  . milrinone 0.25  mcg/kg/min (08/14/15 0800)   . antiseptic oral rinse  7 mL Mouth Rinse BID  . carvedilol  3.125 mg Oral BID WC  . enoxaparin (LOVENOX) injection  40 mg Subcutaneous Q24H  . folic acid  1 mg Oral Daily  . furosemide  60 mg Intravenous BID  . hydroxychloroquine  200 mg Oral BID  . leflunomide  20 mg Oral Daily  . levothyroxine  75 mcg Oral QAC breakfast  . magnesium oxide  400 mg Oral Daily  . mirtazapine  7.5 mg Oral QHS  . mometasone-formoterol  2 puff Inhalation BID  . montelukast  10 mg Oral Daily  . multivitamin with minerals   Oral Daily  . pantoprazole  40 mg Oral Daily  . simvastatin  40 mg Oral Daily  . sodium chloride flush  3 mL Intravenous Q12H  . sodium chloride flush  3 mL Intravenous Q12H   sodium chloride, acetaminophen **OR** acetaminophen, ipratropium-albuterol, morphine injection, nitroGLYCERIN, ondansetron **OR** ondansetron (ZOFRAN) IV, phenol, polyethylene glycol, sodium chloride flush  Assessment/ Plan:  82 y.o. male with medical problems of COPD requiring home oxygen, rheumatoid arthritis, hypertension, severe coronary disease with five-vessel CABG in 1995, congestive heart failure with EF 35-40%, pacemaker, AICD, history of goiter, history of renal stones in the remote past, who was admitted to [facility] on 08/06/2015 for evaluation of shortness of breath that started about a week ago.   1. Acute renal failure - Likely secondary to abnormal cardiorenal hemodynamics - Renal ultrasound unremarkable for obstruction -Patient continues to have fluctuating renal function. This is likely related to his underlying cardiomyopathy as well as Lasix infusion. However he is still in need of diuresis.  2. Shortness of breath  Still unable to exert himself even minimally. Patient remains on inotropic support as well as diuresis.  3. COPD Requires Home oxygen   4. CAD/Severe acute systolic CHF. - As per cardiology.  No good options for revascularization. Patient remains on  inotropic support as well as diuresis. He may be transferred to [facility] for advanced heart failure treatment.   LOS: 8 Teal Bontrager 7/28/20179:03 AM

## 2015-08-15 DIAGNOSIS — Z95810 Presence of automatic (implantable) cardiac defibrillator: Secondary | ICD-10-CM

## 2015-08-15 DIAGNOSIS — Z951 Presence of aortocoronary bypass graft: Secondary | ICD-10-CM

## 2015-08-15 DIAGNOSIS — I5023 Acute on chronic systolic (congestive) heart failure: Secondary | ICD-10-CM

## 2015-08-15 LAB — CBC
HEMATOCRIT: 32.3 % — AB (ref 40.0–52.0)
Hemoglobin: 11 g/dL — ABNORMAL LOW (ref 13.0–18.0)
MCH: 32.1 pg (ref 26.0–34.0)
MCHC: 34.1 g/dL (ref 32.0–36.0)
MCV: 94 fL (ref 80.0–100.0)
Platelets: 121 10*3/uL — ABNORMAL LOW (ref 150–440)
RBC: 3.44 MIL/uL — ABNORMAL LOW (ref 4.40–5.90)
RDW: 18.3 % — AB (ref 11.5–14.5)
WBC: 8.2 10*3/uL (ref 3.8–10.6)

## 2015-08-15 LAB — BASIC METABOLIC PANEL
Anion gap: 8 (ref 5–15)
BUN: 39 mg/dL — AB (ref 6–20)
CALCIUM: 8.4 mg/dL — AB (ref 8.9–10.3)
CO2: 29 mmol/L (ref 22–32)
Chloride: 97 mmol/L — ABNORMAL LOW (ref 101–111)
Creatinine, Ser: 1.44 mg/dL — ABNORMAL HIGH (ref 0.61–1.24)
GFR calc Af Amer: 51 mL/min — ABNORMAL LOW (ref >60)
GFR, EST NON AFRICAN AMERICAN: 44 mL/min — AB (ref >60)
GLUCOSE: 98 mg/dL (ref 65–99)
POTASSIUM: 3.9 mmol/L (ref 3.5–5.1)
Sodium: 134 mmol/L — ABNORMAL LOW (ref 135–145)

## 2015-08-15 LAB — MAGNESIUM: Magnesium: 1.9 mg/dL (ref 1.7–2.4)

## 2015-08-15 NOTE — Progress Notes (Signed)
Per Dr. Alvino Chapel wean patient off of milrinone infusion. Dose decreased, will cont to monitor. Tom Nguyen

## 2015-08-15 NOTE — Progress Notes (Signed)
Subjective:  Patient resting in bed this a.m. On nasal BiPAP at the moment. Renal function appears to be stable with a creatinine of 1.4 and BUN of 39. Urine output 875 cc over the preceding 24 hours. Patient remains on milrinone at this time.  Objective:  Vital signs in last 24 hours:  Temp:  [97.7 F (36.5 C)-98.2 F (36.8 C)] 97.7 F (36.5 C) (07/29 0200) Pulse Rate:  [56-73] 71 (07/29 0600) Resp:  [12-32] 17 (07/29 0600) BP: (94-132)/(53-77) 105/73 (07/29 0600) SpO2:  [90 %-100 %] 98 % (07/29 0600) Weight:  [79.8 kg (175 lb 14.8 oz)] 79.8 kg (175 lb 14.8 oz) (07/29 0446)  Weight change: 3.7 kg (8 lb 2.5 oz) Filed Weights   08/13/15 0522 08/14/15 0500 08/15/15 0446  Weight: 76.3 kg (168 lb 3.4 oz) 76.1 kg (167 lb 12.3 oz) 79.8 kg (175 lb 14.8 oz)    Intake/Output:    Intake/Output Summary (Last 24 hours) at 08/15/15 0738 Last data filed at 08/15/15 0600  Gross per 24 hour  Intake            601.7 ml  Output             1525 ml  Net           -923.3 ml     Physical Exam: General: Laying in bed, NAD.   HEENT Anicteric. Moist mucus membranes.  Neck Supple.  Pulm/lungs Normal effort, minimal basilar rales  CVS/Heart Irregular rhythm.  No rub/gallop.  Abdomen:  Soft, non distended, non tender.  Extremities: No edema.  Neurologic: Awake, alert, oriented.  Skin: No acute rashes.           Basic Metabolic Panel:   Recent Labs Lab 08/10/15 0549 08/11/15 0521 08/12/15 0309 08/13/15 0525 08/14/15 0555 08/15/15 0427  NA 139 138 135 137 133* 134*  K 3.9 4.1 4.5 3.4* 4.3 3.9  CL 102 101 97* 97* 96* 97*  CO2 27 28 25 30 29 29   GLUCOSE 95 96 98 91 122* 98  BUN 30* 32* 39* 38* 41* 39*  CREATININE 1.18 1.26* 1.39* 1.24 1.44* 1.44*  CALCIUM 8.7* 8.6* 8.6* 8.4* 8.5* 8.4*  MG 1.9 1.8  --  1.8 1.9 1.9     CBC:  Recent Labs Lab 08/11/15 0521 08/12/15 0309 08/13/15 0525 08/15/15 0427  WBC 7.6 7.6 8.2 8.2  HGB 11.5* 11.8* 10.4* 11.0*  HCT 33.9* 36.1*  30.7* 32.3*  MCV 96.4 97.6 95.1 94.0  PLT 109* 120* 116* 121*      Microbiology:  Recent Results (from the past 720 hour(s))  C difficile quick scan w PCR reflex     Status: None   Collection Time: 08/06/15 11:39 AM  Result Value Ref Range Status   C Diff antigen NEGATIVE NEGATIVE Final   C Diff toxin NEGATIVE NEGATIVE Final   C Diff interpretation No C. difficile detected.  Final  MRSA PCR Screening     Status: None   Collection Time: 08/12/15 12:30 PM  Result Value Ref Range Status   MRSA by PCR NEGATIVE NEGATIVE Final    Comment:        The GeneXpert MRSA Assay (FDA approved for NASAL specimens only), is one component of a comprehensive MRSA colonization surveillance program. It is not intended to diagnose MRSA infection nor to guide or monitor treatment for MRSA infections.     Coagulation Studies: No results for input(s): LABPROT, INR in the last 72 hours.  Urinalysis: No results  for input(s): COLORURINE, LABSPEC, PHURINE, GLUCOSEU, HGBUR, BILIRUBINUR, KETONESUR, PROTEINUR, UROBILINOGEN, NITRITE, LEUKOCYTESUR in the last 72 hours.  Invalid input(s): APPERANCEUR    Imaging: No results found.   Medications:   . amiodarone 30 mg/hr (08/14/15 2300)  . milrinone 0.25 mcg/kg/min (08/14/15 1800)   . antiseptic oral rinse  7 mL Mouth Rinse BID  . carvedilol  3.125 mg Oral BID WC  . enoxaparin (LOVENOX) injection  40 mg Subcutaneous Q24H  . folic acid  1 mg Oral Daily  . furosemide  60 mg Intravenous Q8H  . hydroxychloroquine  200 mg Oral BID  . leflunomide  20 mg Oral Daily  . levothyroxine  75 mcg Oral QAC breakfast  . losartan  25 mg Oral Daily  . magnesium oxide  400 mg Oral Daily  . mirtazapine  7.5 mg Oral QHS  . mometasone-formoterol  2 puff Inhalation BID  . montelukast  10 mg Oral Daily  . multivitamin with minerals   Oral Daily  . pantoprazole  40 mg Oral Daily  . simvastatin  40 mg Oral Daily  . sodium chloride flush  3 mL Intravenous Q12H  .  sodium chloride flush  3 mL Intravenous Q12H   sodium chloride, acetaminophen **OR** acetaminophen, ipratropium-albuterol, morphine injection, nitroGLYCERIN, ondansetron **OR** ondansetron (ZOFRAN) IV, phenol, polyethylene glycol, sodium chloride flush  Assessment/ Plan:  82 y.o. male with medical problems of COPD requiring home oxygen, rheumatoid arthritis, hypertension, severe coronary disease with five-vessel CABG in 1995, congestive heart failure with EF 35-40%, pacemaker, AICD, history of goiter, history of renal stones in the remote past, who was [facility]/06[facility]2015 for evaluation of shortness of breath that started about a week ago.   1. Acute renal failure - Likely secondary to abnormal cardiorenal hemodynamics - Renal ultrasound unremarkable for obstruction -Renal function stable over the past 2 days. BUN currently 39 with a creatinine of 1.4. Urine output lower than expected for the amount of Lasix that the patient is receiving. Continue Lasix drip for now and continue to monitor renal function closely.  2. Shortness of breath  Patient on nasal BiPAP this morning. Shortness of breath secondary to COPD and heart failure. Continue supportive care for now.  3. COPD Requires Home oxygen, currently on nasal BiPAP.  4. CAD/Severe acute systolic CHF. - Patient has been on inotropic support with milrinone for several days. He is also on amiodarone at this time. Lasix has been increased to 60 mg IV every 8 hours. Await further cardiology input.   LOS: 9 Tom Nguyen 7/29/20177:38 AM

## 2015-08-15 NOTE — Progress Notes (Signed)
Called MD to inquire if to give 60 mg of Lasix with BP and HR on lower side. MD said okay to administer.

## 2015-08-15 NOTE — Progress Notes (Signed)
[employer] - [place] at [facility]   PATIENT NAME: Tom Nguyen    MR#:  [id number]  DATE OF BIRTH:  28-Dec-1932  SUBJECTIVE:   Patient here due to decompensated CHF and acute renal failure. Much improved today. Diuresing well with Lasix and milrinone. Cardiology plans on weaning milrinone today. Patient sitting in the chair with wife at bedside.  REVIEW OF SYSTEMS:    Review of Systems  Constitutional: Negative for chills and fever.  HENT: Negative for congestion and tinnitus.   Eyes: Negative for blurred vision and double vision.  Respiratory: Negative for cough, shortness of breath and wheezing.   Cardiovascular: Negative for chest pain, orthopnea and PND.  Gastrointestinal: Negative for abdominal pain, diarrhea, nausea and vomiting.  Genitourinary: Negative for dysuria and hematuria.  Neurological: Negative for dizziness, sensory change and focal weakness.  All other systems reviewed and are negative.   Nutrition: Heart Healthy Tolerating Diet: Yes Tolerating PT: Await Eval after off Milrinone  DRUG ALLERGIES:  No Known Allergies  VITALS:  Blood pressure (!) 103/49, pulse 65, temperature (!) 96.9 F (36.1 C), temperature source Axillary, resp. rate 18, height 5\' 8"  (1.727 m), weight 79.8 kg (175 lb 14.8 oz), SpO2 99 %.  PHYSICAL EXAMINATION:   Physical Exam  GENERAL:  82 y.o.-year-old patient sitting in chair in no acute distress.  EYES: Pupils equal, round, reactive to light and accommodation. No scleral icterus. Extraocular muscles intact.  HEENT: Head atraumatic, normocephalic. Oropharynx and nasopharynx clear.  NECK:  Supple, no jugular venous distention. No thyroid enlargement, no tenderness.  LUNGS: Normal breath sounds bilaterally, no wheezing, rhonchi, minimal bibasilar rales. No use of accessory muscles of respiration.  CARDIOVASCULAR: S1, S2 normal. No murmurs, rubs, or gallops.  ABDOMEN: Soft, nontender, nondistended. Bowel sounds present.  No organomegaly or mass.  EXTREMITIES: No cyanosis, clubbing or edema b/l.    NEUROLOGIC: Cranial nerves II through XII are intact. No focal Motor or sensory deficits b/l.   PSYCHIATRIC: The patient is alert and oriented x 3.  SKIN: No obvious rash, lesion, or ulcer.    LABORATORY PANEL:   CBC  Recent Labs Lab 08/15/15 0427  WBC 8.2  HGB 11.0*  HCT 32.3*  PLT 121*   ------------------------------------------------------------------------------------------------------------------  Chemistries   Recent Labs Lab 08/15/15 0427  NA 134*  K 3.9  CL 97*  CO2 29  GLUCOSE 98  BUN 39*  CREATININE 1.44*  CALCIUM 8.4*  MG 1.9   ------------------------------------------------------------------------------------------------------------------  Cardiac Enzymes  Recent Labs Lab 08/09/15 2321  TROPONINI 0.05*   ------------------------------------------------------------------------------------------------------------------  RADIOLOGY:  No results found.   ASSESSMENT AND PLAN:   82 year old male with past medical history of systolic CHF, chronic kidney disease stage III, history of previous MI, ischemic cardiac myopathy, hypertension, hyperlipidemia, COPD who presented to the hospital due to shortness of breath and noted to be in CHF.  1. Acute on chronic combined diastolic/systolic CHF-clinically much improved with IV diuresis with Lasix and also with milrinone. -Patient is about 7 L negative since admission. -Appreciate cardiology input and plan for weaning milrinone today. Continue diuresis with Lasix for now, continue Coreg, losartan.  2. Acute on chronic respiratory failure with hypoxia-secondary to CHF. -Much improved with diuresis, wean O2 as tolerated.  3. COPD-no acute exacerbation. Continue Dulera  4. History of rheumatoid arthritis-continue Plaquenil.  5. GERD-continue Protonix.  6. Hyperlipidemia-continue simvastatin.  7. Acute renal failure-secondary  to CHF and cardiorenal hemodynamics. Renal ultrasound negative for obstruction. -Nephrology following, BUN and creatinine stable.  Urine output is fair.  Await PT eval after off Milrinone.   All the records are reviewed and case discussed with Care Management/Social Workerr. Management plans discussed with the patient, family and they are in agreement.  CODE STATUS: partial/Limited  DVT Prophylaxis: Lovenox  TOTAL TIME TAKING CARE OF THIS PATIENT: 30 minutes.   POSSIBLE D/C IN 2-3 DAYS, DEPENDING ON CLINICAL CONDITION.   Houston Siren M.D on 08/15/2015 at 12:42 PM  Between 7am to 6pm - Pager - [phone]  After 6pm go to [email or web address] - password [email or web address] [facility]  [facility]  Office  [phone]  CC: Primary care physician; [name], DO

## 2015-08-15 NOTE — Progress Notes (Signed)
Patient: Tom Nguyen / Admit Date: 08/06/2015 / Date of Encounter: 08/15/2015, 8:23 AM   Subjective: Pt reports that overall he feels slightly better. No SOB at rest. Usually sleeps with bed inclined slightly. No CP, abd pain. No palpitations.  Review of Systems: Other than what is entioned above,  Review of Systems  All other systems reviewed and are negative.   Objective: Telemetry: Paced Physical Exam: Blood pressure 105/76, pulse 69, temperature (!) 96.9 F (36.1 C), temperature source Axillary, resp. rate (!) 22, height 5\' 8"  (1.727 m), weight 175 lb 14.8 oz (79.8 kg), SpO2 99 %. Body mass index is 26.75 kg/m. General: Frail appearing. Head: Normocephalic, atraumatic, sclera non-icteric, no xanthomas, nares are without discharge. Neck: Negative for carotid bruits. JVP mildly elevated. Lungs: Coarse breath sounds. Breathing is unlabored. Heart: RRR S1 S2 without murmurs, rubs, or gallops.  Abdomen: Soft, non-tender, non-distended with normoactive bowel sounds. No rebound/guarding. Extremities: No clubbing or cyanosis. No edema. Distal pedal pulses are 2+ and equal bilaterally. Neuro: Alert and oriented X 3. Moves all extremities spontaneously. Psych:  Responds to questions appropriately with a normal affect.   Intake/Output Summary (Last 24 hours) at 08/15/15 0823 Last data filed at 08/15/15 0730  Gross per 24 hour  Intake            546.2 ml  Output             1600 ml  Net          -1053.8 ml    Inpatient Medications:  . antiseptic oral rinse  7 mL Mouth Rinse BID  . carvedilol  3.125 mg Oral BID WC  . enoxaparin (LOVENOX) injection  40 mg Subcutaneous Q24H  . folic acid  1 mg Oral Daily  . furosemide  60 mg Intravenous Q8H  . hydroxychloroquine  200 mg Oral BID  . leflunomide  20 mg Oral Daily  . levothyroxine  75 mcg Oral QAC breakfast  . losartan  25 mg Oral Daily  . magnesium oxide  400 mg Oral Daily  . mirtazapine  7.5 mg Oral QHS  .  mometasone-formoterol  2 puff Inhalation BID  . montelukast  10 mg Oral Daily  . multivitamin with minerals   Oral Daily  . pantoprazole  40 mg Oral Daily  . simvastatin  40 mg Oral Daily  . sodium chloride flush  3 mL Intravenous Q12H  . sodium chloride flush  3 mL Intravenous Q12H   Infusions:  . amiodarone 30 mg/hr (08/14/15 2300)  . milrinone 0.25 mcg/kg/min (08/14/15 1800)    Labs:  Recent Labs  08/14/15 0555 08/15/15 0427  NA 133* 134*  K 4.3 3.9  CL 96* 97*  CO2 29 29  GLUCOSE 122* 98  BUN 41* 39*  CREATININE 1.44* 1.44*  CALCIUM 8.5* 8.4*  MG 1.9 1.9   No results for input(s): AST, ALT, ALKPHOS, BILITOT, PROT, ALBUMIN in the last 72 hours.  Recent Labs  08/13/15 0525 08/15/15 0427  WBC 8.2 8.2  HGB 10.4* 11.0*  HCT 30.7* 32.3*  MCV 95.1 94.0  PLT 116* 121*   No results for input(s): CKTOTAL, CKMB, TROPONINI in the last 72 hours. Invalid input(s): POCBNP No results for input(s): HGBA1C in the last 72 hours.   Weights: Filed Weights   08/13/15 0522 08/14/15 0500 08/15/15 0446  Weight: 168 lb 3.4 oz (76.3 kg) 167 lb 12.3 oz (76.1 kg) 175 lb 14.8 oz (79.8 kg)     Radiology/Studies:  Dg Chest 1 View  Result Date: 08/09/2015 CLINICAL DATA:  Came in on 08-06-15 with SOB. Hx of CHF-follow up. Former smoker. Hx of HTN, COPD, CAD, Asthma, MI, Pacemaker, CABG. EXAM: CHEST 1 VIEW COMPARISON:  08/06/2015 FINDINGS: Pacer/AICD device. Bilateral shoulder arthroplasty. Midline trachea. Cardiomegaly accentuated by AP portable technique. Atherosclerosis in the transverse aorta. Trace right pleural fluid or thickening, increased. No pneumothorax. Moderate interstitial edema, increased. This is asymmetric, greater on the right. Worsened right base aeration, favoring progressive atelectasis. IMPRESSION: Moderate congestive heart failure with increase in small right pleural effusion. Developing right base airspace disease, favoring atelectasis. Aortic atherosclerosis.  Electronically Signed   By: Jeronimo Greaves M.D.   On: 08/09/2015 12:11  US Renal  Result Date: 08/07/2015 CLINICAL DATA:  Acute renal failure. EXAM: RENAL / URINARY TRACT ULTRASOUND COMPLETE COMPARISON:  None. FINDINGS: Right Kidney: Length: 9.3 cm. Slightly increased echogenicity is noted. No mass or hydronephrosis visualized. Left Kidney: Length: 11.5 cm. Multiple cysts are noted, with the largest measuring 2.1 cm in upper pole. Slightly increased echogenicity is noted. No mass or hydronephrosis visualized. Bladder: Appears normal for degree of bladder distention. Bilateral ureteral jets are noted. IMPRESSION: Simple left renal cysts. No hydronephrosis or renal obstruction is noted. Slightly increased echogenicity of renal parenchyma bilaterally is noted suggesting medical renal disease. Minimal ascites is noted incidentally. Electronically Signed   By: Lupita Raider, M.D.   On: 08/07/2015 10:53   Dg Chest Port 1 View  Result Date: 08/12/2015 CLINICAL DATA:  Status post PICC line placement EXAM: PORTABLE CHEST 1 VIEW COMPARISON:  08/09/2015 FINDINGS: Cardiac shadow remains enlarged. A defibrillator is again seen and stable. A new right-sided PICC line is noted with catheter tip just below the cavoatrial junction in satisfactory position. Postsurgical changes are noted. The lungs are well aerated bilaterally with minimal thickening of the right minor fissure. No focal infiltrate is seen. IMPRESSION: PICC line as described in satisfactory position. Electronically Signed   By: Alcide Clever M.D.   On: 08/12/2015 17:10  Dg Chest Portable 1 View  Result Date: 08/06/2015 CLINICAL DATA:  Shortness of breath for the last week, worsening today. EXAM: PORTABLE CHEST 1 VIEW COMPARISON:  04/14/2015 FINDINGS: Pacemaker defibrillator remains in place. Previous median sternotomy. Chronic cardiomegaly. Chronic aortic atherosclerosis. Small amount of pleural fluid. Venous hypertension with question early interstitial  edema. IMPRESSION: Cardiomegaly. Question venous hypertension with early interstitial edema. Electronically Signed   By: Paulina Fusi M.D.   On: 08/06/2015 12:56     Assessment and Plan  Principal Problem:   Acute respiratory failure with hypoxia (HCC) - secondary to combined COPD and acute on chronic heart failure Active Problems:   Cardiomyopathy, ischemic   Acute on chronic renal failure (HCC)   Diarrhea   Coronary artery disease involving coronary bypass graft of native heart without angina pectoris   Pulmonary HTN (HCC)   Centrilobular emphysema (HCC)   Acute on chronic combined systolic and diastolic CHF, NYHA class 2 (HCC)   Cardiorenal syndrome with renal failure - improving with IV Lasix   ARF (acute renal failure) (HCC)   Combined systolic and diastolic congestive heart failure (HCC)   Elevated troponin   DNR (do not resuscitate) discussion   Palliative care encounter    --- Acute on chronic systolic CHF LBBB S/p ICD Trial of weaning off of milrinone infusion Cont Lasix up to 60 mg IV every 8 hrs, also as per nephrology. Renal function somewhat stable. Continue to monitor. Cont losartan  25 mg daily and Coreg Continue amiodarone IV for now. Per family discussion previously: They would like to continue to push forward with aggressive care at this time. Family unable to drive to [facility], little benefit in transferring to [facility] He is not a candidate for LVAD or other aggressive modalities given his severe emphysema   --CAD s/p CABG No ongoing CP Declined stress test as outpatient on last office visit Continue medical therapy, rec asa 81mg  po qd unless contraindicated. Cont BB, ARB, statin therapy   --Shortness of breath Multifactorial, at this point more predominantly emphysema We'll continue gentle diuresis with close monitoring of renal function If creatinine continues to climb, may need to decrease Lasix dosing  --Depression Per previous discussion: He needs  to motivate and push himself to some degree if he is going to recover Mood appears better this am May benefit from SSRI  --COPD On inhalers Severe underlying symptoms contributing to his shortness of breath  Signed, June, MD, [facility] 08/15/2015, 8:23 AM

## 2015-08-15 NOTE — Progress Notes (Signed)
ELECTROLYTE CONSULT NOTE - Follow up  Pharmacy Consult for Electrolyte monitoring and replacement Indication: Lasix drip- (now changed to IV q8h)  No Known Allergies  Patient Measurements: Height: 5\' 8"  (172.7 cm) Weight: 175 lb 14.8 oz (79.8 kg) IBW/kg (Calculated) : 68.4  Vital Signs: Temp: 96.9 F (36.1 C) (07/29 0745) Temp Source: Axillary (07/29 0745) BP: 106/71 (07/29 0845) Pulse Rate: 60 (07/29 0845) Intake/Output from previous day: 07/28 0701 - 07/29 0700 In: 601.7 [P.O.:60; I.V.:541.7] Out: 1525 [Urine:875; Stool:650] Intake/Output from this shift: Total I/O In: -  Out: 250 [Urine:250]  Labs:  Recent Labs  08/13/15 0525 08/15/15 0427  WBC 8.2 8.2  HGB 10.4* 11.0*  HCT 30.7* 32.3*  PLT 116* 121*     Recent Labs  08/13/15 0525 08/14/15 0555 08/15/15 0427  NA 137 133* 134*  K 3.4* 4.3 3.9  CL 97* 96* 97*  CO2 30 29 29   GLUCOSE 91 122* 98  BUN 38* 41* 39*  CREATININE 1.24 1.44* 1.44*  CALCIUM 8.4* 8.5* 8.4*  MG 1.8 1.9 1.9   Estimated Creatinine Clearance: 39.6 mL/min (by C-G formula based on SCr of 1.44 mg/dL).    Recent Labs  08/12/15 1216  GLUCAP 137*     Assessment: 82 yo male with AECHF on lasix. Current orders for lasix 60mg  IV Q8H.  Plan:  Electrolytes are WNL. Will continue to monitor potassium daily while on Lasix and milrinone and not on scheduled potassium supplement. Patient on MagOX 400mg  daily.  08/14/15 PharmD Clinical Pharmacist 08/15/2015 9:27 AM

## 2015-08-15 NOTE — Progress Notes (Signed)
Patient able to be weaned off milrinone gtt. Currently only amio infusing. Patient resting at this time. No complaints of pain during shift, family and patient updated on plan of care. Trudee Kuster

## 2015-08-16 LAB — BASIC METABOLIC PANEL
ANION GAP: 11 (ref 5–15)
Anion gap: 10 (ref 5–15)
BUN: 42 mg/dL — AB (ref 6–20)
BUN: 42 mg/dL — ABNORMAL HIGH (ref 6–20)
CALCIUM: 8.2 mg/dL — AB (ref 8.9–10.3)
CALCIUM: 8.2 mg/dL — AB (ref 8.9–10.3)
CHLORIDE: 93 mmol/L — AB (ref 101–111)
CO2: 28 mmol/L (ref 22–32)
CO2: 26 mmol/L (ref 22–32)
Chloride: 95 mmol/L — ABNORMAL LOW (ref 101–111)
Creatinine, Ser: 1.47 mg/dL — ABNORMAL HIGH (ref 0.61–1.24)
Creatinine, Ser: 1.49 mg/dL — ABNORMAL HIGH (ref 0.61–1.24)
GFR calc Af Amer: 50 mL/min — ABNORMAL LOW (ref >60)
GFR calc Af Amer: 49 mL/min — ABNORMAL LOW (ref >60)
GFR calc non Af Amer: 43 mL/min — ABNORMAL LOW (ref >60)
GFR, EST NON AFRICAN AMERICAN: 43 mL/min — AB (ref >60)
GLUCOSE: 103 mg/dL — AB (ref 65–99)
GLUCOSE: 135 mg/dL — AB (ref 65–99)
POTASSIUM: 3.8 mmol/L (ref 3.5–5.1)
Potassium: 3.8 mmol/L (ref 3.5–5.1)
Sodium: 133 mmol/L — ABNORMAL LOW (ref 135–145)
Sodium: 130 mmol/L — ABNORMAL LOW (ref 135–145)

## 2015-08-16 LAB — BRAIN NATRIURETIC PEPTIDE: B Natriuretic Peptide: 2409 pg/mL — ABNORMAL HIGH (ref 0.0–100.0)

## 2015-08-16 MED ORDER — DIPHENHYDRAMINE HCL 25 MG PO CAPS
25.0000 mg | ORAL_CAPSULE | Freq: Four times a day (QID) | ORAL | Status: DC | PRN
  Administered 2015-08-16 – 2015-08-17 (×2): 25 mg via ORAL
  Filled 2015-08-16 (×2): qty 1

## 2015-08-16 MED ORDER — SODIUM CHLORIDE 0.9 % IV BOLUS (SEPSIS)
500.0000 mL | Freq: Once | INTRAVENOUS | Status: AC
Start: 2015-08-16 — End: 2015-08-16
  Administered 2015-08-16: 500 mL via INTRAVENOUS

## 2015-08-16 NOTE — Progress Notes (Signed)
PT Cancellation Note  Patient Details Name: Tom Nguyen MRN: [id number] DOB: May 17, 1932   Cancelled Treatment:    Reason Eval/Treat Not Completed: Fatigue/lethargy limiting ability to participate Pt had been sitting in chair earlier today and though he is eager to work with PT he is wanting to hold today secondary to feeling very tired at this time. Will try back tomorrow. Malachi Pro 08/16/2015, 2:08 PM

## 2015-08-16 NOTE — Progress Notes (Signed)
ELECTROLYTE CONSULT NOTE - Follow up  Pharmacy Consult for Electrolyte monitoring and replacement Indication: Lasix drip- (now changed to IV q8h)  No Known Allergies  Patient Measurements: Height: 5\' 8"  (172.7 cm) Weight: 175 lb 4.3 oz (79.5 kg) IBW/kg (Calculated) : 68.4  Vital Signs: Temp: 98.8 F (37.1 C) (07/30 0200) Temp Source: Axillary (07/30 0200) BP: 104/67 (07/30 0600) Pulse Rate: 62 (07/30 0600) Intake/Output from previous day: 07/29 0701 - 07/30 0700 In: 1098 [P.O.:680; I.V.:418] Out: 570 [Urine:570] Intake/Output from this shift: No intake/output data recorded.  Labs:  Recent Labs  08/15/15 0427  WBC 8.2  HGB 11.0*  HCT 32.3*  PLT 121*     Recent Labs  08/14/15 0555 08/15/15 0427 08/16/15 0404  NA 133* 134* 133*  K 4.3 3.9 3.8  CL 96* 97* 95*  CO2 29 29 28   GLUCOSE 122* 98 103*  BUN 41* 39* 42*  CREATININE 1.44* 1.44* 1.47*  CALCIUM 8.5* 8.4* 8.2*  MG 1.9 1.9  --    Estimated Creatinine Clearance: 38.8 mL/min (by C-G formula based on SCr of 1.47 mg/dL).   No results for input(s): GLUCAP in the last 72 hours.   Assessment: 82 yo male with AECHF on lasix. Current orders for lasix 60mg  IV Q8H.  Plan:  Electrolytes are WNL. Will continue to monitor potassium daily while on Lasix and milrinone and not on scheduled potassium supplement. Patient on MagOX 400mg  daily. F/u am labs.  PharmD Clinical Pharmacist 08/16/2015 7:48 AM

## 2015-08-16 NOTE — Progress Notes (Signed)
Spoke with nurse re pt's status. He is still continuing to diurese gently, VS stable, pt comfortable.  Will hold milrinone/amiodarone. Crea creeping up.Will hold lasix after tonight's dose so team can reevaluate in the am.

## 2015-08-16 NOTE — Progress Notes (Signed)
Called Dr. Alvino Chapel from cardiology to report no pt. UOP since 1900, even after PM dose of lasix (see EMR for details).  Bladder scanned pt. Only 20 mL present. Discussed milrinone gtt that was stopped per Dr. Alvino Chapel order on day shift by 1500 (see EMR for details). Discussed pt.'s VS this evening, discussed the 5 beats of V tach that happened @ 2221, discussed amount of lasix and amount of UOP during day shift and on 7/28-7/29.  MD instructed to continue to monitor pt. And will re-evaluate in the AM.

## 2015-08-16 NOTE — Progress Notes (Signed)
Alerted Dr. Anne Hahn of pt.'s lack of UOP from 1900 on, discussed pt.'s PMHx and recent UOP amounts. Made MD aware of bladder scan amt and that cardiology was consulted. Discussed whether IVF was appropriate due to pt.'s low oral intake. MD defers to cardiology and situation will be re-evaluated tomorrow.

## 2015-08-16 NOTE — Progress Notes (Signed)
Subjective:  Patient off milrinone at the moment. Creatinine stable at 1.4. However urine output has dropped off over the preceding 24 hours since the patient has been off of milrinone. States that he was able to sit up yesterday.   Objective:  Vital signs in last 24 hours:  Temp:  [96.9 F (36.1 C)-99 F (37.2 C)] 97.6 F (36.4 C) (07/30 0801) Pulse Rate:  [59-71] 61 (07/30 0801) Resp:  [12-30] 12 (07/30 0801) BP: (88-110)/(49-86) 107/69 (07/30 0801) SpO2:  [92 %-100 %] 100 % (07/30 0801) Weight:  [79.5 kg (175 lb 4.3 oz)] 79.5 kg (175 lb 4.3 oz) (07/30 0425)  Weight change: -0.3 kg (-10.6 oz) Filed Weights   08/14/15 0500 08/15/15 0446 08/16/15 0425  Weight: 76.1 kg (167 lb 12.3 oz) 79.8 kg (175 lb 14.8 oz) 79.5 kg (175 lb 4.3 oz)    Intake/Output:    Intake/Output Summary (Last 24 hours) at 08/16/15 1057 Last data filed at 08/16/15 0845  Gross per 24 hour  Intake          1091.41 ml  Output              620 ml  Net           471.41 ml     Physical Exam: General: Laying in bed, NAD.   HEENT Anicteric. Moist mucus membranes.  Neck Supple.  Pulm/lungs Normal effort, minimal basilar rales  CVS/Heart Irregular rhythm.  No rub/gallop.  Abdomen:  Soft, non distended, non tender.  Extremities: No edema.  Neurologic: Awake, alert, oriented.  Skin: No acute rashes.           Basic Metabolic Panel:   Recent Labs Lab 08/10/15 0549 08/11/15 0521 08/12/15 0309 08/13/15 0525 08/14/15 0555 08/15/15 0427 08/16/15 0404  NA 139 138 135 137 133* 134* 133*  K 3.9 4.1 4.5 3.4* 4.3 3.9 3.8  CL 102 101 97* 97* 96* 97* 95*  CO2 27 28 25 30 29 29 28   GLUCOSE 95 96 98 91 122* 98 103*  BUN 30* 32* 39* 38* 41* 39* 42*  CREATININE 1.18 1.26* 1.39* 1.24 1.44* 1.44* 1.47*  CALCIUM 8.7* 8.6* 8.6* 8.4* 8.5* 8.4* 8.2*  MG 1.9 1.8  --  1.8 1.9 1.9  --      CBC:  Recent Labs Lab 08/11/15 0521 08/12/15 0309 08/13/15 0525 08/15/15 0427  WBC 7.6 7.6 8.2 8.2  HGB 11.5*  11.8* 10.4* 11.0*  HCT 33.9* 36.1* 30.7* 32.3*  MCV 96.4 97.6 95.1 94.0  PLT 109* 120* 116* 121*      Microbiology:  Recent Results (from the past 720 hour(s))  C difficile quick scan w PCR reflex     Status: None   Collection Time: 08/06/15 11:39 AM  Result Value Ref Range Status   C Diff antigen NEGATIVE NEGATIVE Final   C Diff toxin NEGATIVE NEGATIVE Final   C Diff interpretation No C. difficile detected.  Final  MRSA PCR Screening     Status: None   Collection Time: 08/12/15 12:30 PM  Result Value Ref Range Status   MRSA by PCR NEGATIVE NEGATIVE Final    Comment:        The GeneXpert MRSA Assay (FDA approved for NASAL specimens only), is one component of a comprehensive MRSA colonization surveillance program. It is not intended to diagnose MRSA infection nor to guide or monitor treatment for MRSA infections.     Coagulation Studies: No results for input(s): LABPROT, INR in the last  72 hours.  Urinalysis: No results for input(s): COLORURINE, LABSPEC, PHURINE, GLUCOSEU, HGBUR, BILIRUBINUR, KETONESUR, PROTEINUR, UROBILINOGEN, NITRITE, LEUKOCYTESUR in the last 72 hours.  Invalid input(s): APPERANCEUR    Imaging: No results found.   Medications:   . amiodarone 30 mg/hr (08/16/15 0700)  . milrinone Stopped (08/15/15 1441)   . antiseptic oral rinse  7 mL Mouth Rinse BID  . carvedilol  3.125 mg Oral BID WC  . enoxaparin (LOVENOX) injection  40 mg Subcutaneous Q24H  . folic acid  1 mg Oral Daily  . furosemide  60 mg Intravenous Q8H  . hydroxychloroquine  200 mg Oral BID  . leflunomide  20 mg Oral Daily  . levothyroxine  75 mcg Oral QAC breakfast  . losartan  25 mg Oral Daily  . magnesium oxide  400 mg Oral Daily  . mirtazapine  7.5 mg Oral QHS  . mometasone-formoterol  2 puff Inhalation BID  . montelukast  10 mg Oral Daily  . multivitamin with minerals   Oral Daily  . pantoprazole  40 mg Oral Daily  . simvastatin  40 mg Oral Daily  . sodium chloride  flush  3 mL Intravenous Q12H  . sodium chloride flush  3 mL Intravenous Q12H   sodium chloride, acetaminophen **OR** acetaminophen, diphenhydrAMINE, ipratropium-albuterol, morphine injection, nitroGLYCERIN, ondansetron **OR** ondansetron (ZOFRAN) IV, phenol, polyethylene glycol, sodium chloride flush  Assessment/ Plan:  82 y.o. male with medical problems of COPD requiring home oxygen, rheumatoid arthritis, hypertension, severe coronary disease with five-vessel CABG in 1995, congestive heart failure with EF 35-40%, pacemaker, AICD, history of goiter, history of renal stones in the remote past, who was admitted to [facility] on 08/06/2015 for evaluation of shortness of breath that started about a week ago.   1. Acute renal failure - Likely secondary to abnormal cardiorenal hemodynamics - Renal ultrasound unremarkable for obstruction -Over the past 3 days renal function has been relatively stable. However urine output dropped off over the preceding 24 hours after milrinone was stopped. Cardiology considering restarting milrinone given drop in urine output. For now continue current dosage of Lasix.  2. Shortness of breath  Overall this appears to be improved and is multifactorial. Continue diuresis and supplemental oxygen.  3. COPD Requires Home oxygen, has been on nasal BiPAP at night here.  4. CAD/Severe acute systolic CHF. -Patient had been on milrinone for several days however this was stopped yesterday. Urine output has dropped in the interim. Cardiology considering restarting milrinone. Continue current dosage of Lasix as above.   LOS: 10 Denicia Pagliarulo 7/30/201710:57 AM

## 2015-08-16 NOTE — Progress Notes (Signed)
[employer] [facility] [place] [facility]   PATIENT NAME: Tom Nguyen    MR#:  [id number]  DATE OF BIRTH:  01-Sep-1932  SUBJECTIVE:   Patient here due to decompensated CHF and acute renal failure. Urine output has dropped as pt. Has been off milrinone.  No shortness of breath, CP.  Family at bedside. He is complaining of some itching.    REVIEW OF SYSTEMS:    Review of Systems  Constitutional: Negative for chills and fever.  HENT: Negative for congestion and tinnitus.   Eyes: Negative for blurred vision and double vision.  Respiratory: Negative for cough, shortness of breath and wheezing.   Cardiovascular: Negative for chest pain, orthopnea and PND.  Gastrointestinal: Negative for abdominal pain, diarrhea, nausea and vomiting.  Genitourinary: Negative for dysuria and hematuria.  Neurological: Negative for dizziness, sensory change and focal weakness.  All other systems reviewed and are negative.   Nutrition: Heart Healthy Tolerating Diet: Yes Tolerating PT: Await Eval after off Milrinone  DRUG ALLERGIES:  No Known Allergies  VITALS:  Blood pressure 99/68, pulse 63, temperature (!) 96.4 F (35.8 C), temperature source Axillary, resp. rate (!) 22, height 5\' 8"  (1.727 m), weight 79.5 kg (175 lb 4.3 oz), SpO2 100 %.  PHYSICAL EXAMINATION:   Physical Exam  GENERAL:  82 y.o.-year-old patient lying in bed in no acute distress.  EYES: Pupils equal, round, reactive to light and accommodation. No scleral icterus. Extraocular muscles intact.  HEENT: Head atraumatic, normocephalic. Oropharynx and nasopharynx clear.  NECK:  Supple, no jugular venous distention. No thyroid enlargement, no tenderness.  LUNGS: Normal breath sounds bilaterally, no wheezing, rhonchi, minimal bibasilar rales. No use of accessory muscles of respiration.  CARDIOVASCULAR: S1, S2 normal. No murmurs, rubs, or gallops.  ABDOMEN: Soft, nontender, nondistended. Bowel sounds present. No organomegaly or  mass.  EXTREMITIES: No cyanosis, clubbing or edema b/l.    NEUROLOGIC: Cranial nerves II through XII are intact. No focal Motor or sensory deficits b/l.   PSYCHIATRIC: The patient is alert and oriented x 3.  SKIN: No obvious rash, lesion, or ulcer.    LABORATORY PANEL:   CBC  Recent Labs Lab 08/15/15 0427  WBC 8.2  HGB 11.0*  HCT 32.3*  PLT 121*   ------------------------------------------------------------------------------------------------------------------  Chemistries   Recent Labs Lab 08/15/15 0427 08/16/15 0404  NA 134* 133*  K 3.9 3.8  CL 97* 95*  CO2 29 28  GLUCOSE 98 103*  BUN 39* 42*  CREATININE 1.44* 1.47*  CALCIUM 8.4* 8.2*  MG 1.9  --    ------------------------------------------------------------------------------------------------------------------  Cardiac Enzymes  Recent Labs Lab 08/09/15 2321  TROPONINI 0.05*   ------------------------------------------------------------------------------------------------------------------  RADIOLOGY:  No results found.   ASSESSMENT AND PLAN:   82 year old male with past medical history of systolic CHF, chronic kidney disease stage III, history of previous MI, ischemic cardiac myopathy, hypertension, hyperlipidemia, COPD who presented to the hospital due to shortness of breath and noted to be in CHF.  1. Acute on chronic combined diastolic/systolic CHF-clinically much improved with IV diuresis with Lasix and also with milrinone. -Patient is about 6 L negative since admission. -Urine output has dropped overnight as patient has been off the milrinone. Continue current dose of Lasix, cardiology debating whether to restart milrinone and well if his urine output remains poor throughout the day today. - continue Coreg, losartan.  2. Acute on chronic respiratory failure with hypoxia-secondary to CHF. -Much improved with diuresis, wean O2 as tolerated.  3. COPD-no acute exacerbation. Continue  Dulera  4.  History of rheumatoid arthritis-continue Plaquenil.  5. GERD-continue Protonix.  6. Hyperlipidemia-continue simvastatin.  7. Acute renal failure-secondary to CHF and cardiorenal hemodynamics. Renal ultrasound negative for obstruction. She is urine output is poor as patient was taken off milrinone yesterday. Continue current dose of Lasix, cardiology debating whether restart milrinone. Creatinine stable. Nephrology following and will continue to monitor.  Await PT eval after off Milrinone.   All the records are reviewed and case discussed with Care Management/Social Workerr. Management plans discussed with the patient, family and they are in agreement.  CODE STATUS: partial/Limited  DVT Prophylaxis: Lovenox  TOTAL TIME TAKING CARE OF THIS PATIENT: 30 minutes.   POSSIBLE D/C IN 2-3 DAYS, DEPENDING ON CLINICAL CONDITION.   Houston Siren M.D on 08/16/2015 at 1:02 PM  Between 7am to 6pm - Pager - [phone]  After 6pm go to [email or web address] - password [email or web address] [facility]  [facility]  Office  [phone]  CC: Primary care physician; Tom Sams, DO

## 2015-08-16 NOTE — Progress Notes (Signed)
Patient: Tom Nguyen / Admit Date: 08/06/2015 / Date of Encounter: 08/16/2015, 8:50 AM   Subjective: Pt reports that overall he feels slightly better. He had a lot of excitement yesterday with family visiting. Because of the excitement, he slept late. He was awoken with blood draw. No SOB at rest. Not very active but reports that he had little shortness of breath with one from bed to chair. This is improved since admission. Usually sleeps with bed inclined slightly. No CP, no abd pain. No palpitations. Appetite is getting better.  Overnight, noted to have decreased urine output. Per discussion with nurse last night, he was otherwise stable hemodynamically and was not in respiratory distress. The plan was to reassess the next morning to determine plan of action. This morning he received a 500 mL bolus as ordered by primary M.D.  Review of Systems: Other than what is mentioned above,  Review of Systems  All other systems reviewed and are negative.   Objective: Telemetry: Paced Physical Exam: Blood pressure 107/69, pulse 61, temperature 97.6 F (36.4 C), temperature source Oral, resp. rate 12, height 5\' 8"  (1.727 m), weight 175 lb 4.3 oz (79.5 kg), SpO2 100 %. Body mass index is 26.65 kg/m. General: Frail appearing. Head: Normocephalic, atraumatic, sclera non-icteric, no xanthomas, nares are without discharge. Neck: Negative for carotid bruits. JVP mildly elevated. Lungs: Coarse breath sounds. Breathing is unlabored. Heart: RRR S1 S2 without murmurs, rubs, or gallops.  Abdomen: Soft, non-tender, non-distended with normoactive bowel sounds. No rebound/guarding. Extremities: No clubbing or cyanosis. No edema. Distal pedal pulses are 2+ and equal bilaterally, extremities are not cool to the touch Neuro: Alert and oriented X 3. Moves all extremities spontaneously. Psych:  Responds to questions appropriately with a normal affect.   Intake/Output Summary (Last 24 hours) at 08/16/15  0850 Last data filed at 08/16/15 0845  Gross per 24 hour  Intake          1331.41 ml  Output              620 ml  Net           711.41 ml    Inpatient Medications:  . antiseptic oral rinse  7 mL Mouth Rinse BID  . carvedilol  3.125 mg Oral BID WC  . enoxaparin (LOVENOX) injection  40 mg Subcutaneous Q24H  . folic acid  1 mg Oral Daily  . furosemide  60 mg Intravenous Q8H  . hydroxychloroquine  200 mg Oral BID  . leflunomide  20 mg Oral Daily  . levothyroxine  75 mcg Oral QAC breakfast  . losartan  25 mg Oral Daily  . magnesium oxide  400 mg Oral Daily  . mirtazapine  7.5 mg Oral QHS  . mometasone-formoterol  2 puff Inhalation BID  . montelukast  10 mg Oral Daily  . multivitamin with minerals   Oral Daily  . pantoprazole  40 mg Oral Daily  . simvastatin  40 mg Oral Daily  . sodium chloride flush  3 mL Intravenous Q12H  . sodium chloride flush  3 mL Intravenous Q12H   Infusions:  . amiodarone 30 mg/hr (08/16/15 0700)  . milrinone Stopped (08/15/15 1441)    Labs:  Recent Labs  08/14/15 0555 08/15/15 0427 08/16/15 0404  NA 133* 134* 133*  K 4.3 3.9 3.8  CL 96* 97* 95*  CO2 29 29 28   GLUCOSE 122* 98 103*  BUN 41* 39* 42*  CREATININE 1.44* 1.44* 1.47*  CALCIUM 8.5* 8.4*  8.2*  MG 1.9 1.9  --    No results for input(s): AST, ALT, ALKPHOS, BILITOT, PROT, ALBUMIN in the last 72 hours.  Recent Labs  08/15/15 0427  WBC 8.2  HGB 11.0*  HCT 32.3*  MCV 94.0  PLT 121*   No results for input(s): CKTOTAL, CKMB, TROPONINI in the last 72 hours. Invalid input(s): POCBNP No results for input(s): HGBA1C in the last 72 hours.   Weights: Filed Weights   08/14/15 0500 08/15/15 0446 08/16/15 0425  Weight: 167 lb 12.3 oz (76.1 kg) 175 lb 14.8 oz (79.8 kg) 175 lb 4.3 oz (79.5 kg)     Radiology/Studies:  Dg Chest 1 View  Result Date: 08/09/2015 CLINICAL DATA:  Came in on 08-06-15 with SOB. Hx of CHF-follow up. Former smoker. Hx of HTN, COPD, CAD, Asthma, MI, Pacemaker,  CABG. EXAM: CHEST 1 VIEW COMPARISON:  08/06/2015 FINDINGS: Pacer/AICD device. Bilateral shoulder arthroplasty. Midline trachea. Cardiomegaly accentuated by AP portable technique. Atherosclerosis in the transverse aorta. Trace right pleural fluid or thickening, increased. No pneumothorax. Moderate interstitial edema, increased. This is asymmetric, greater on the right. Worsened right base aeration, favoring progressive atelectasis. IMPRESSION: Moderate congestive heart failure with increase in small right pleural effusion. Developing right base airspace disease, favoring atelectasis. Aortic atherosclerosis. Electronically Signed   By: Jeronimo Greaves M.D.   On: 08/09/2015 12:11  US Renal  Result Date: 08/07/2015 CLINICAL DATA:  Acute renal failure. EXAM: RENAL / URINARY TRACT ULTRASOUND COMPLETE COMPARISON:  None. FINDINGS: Right Kidney: Length: 9.3 cm. Slightly increased echogenicity is noted. No mass or hydronephrosis visualized. Left Kidney: Length: 11.5 cm. Multiple cysts are noted, with the largest measuring 2.1 cm in upper pole. Slightly increased echogenicity is noted. No mass or hydronephrosis visualized. Bladder: Appears normal for degree of bladder distention. Bilateral ureteral jets are noted. IMPRESSION: Simple left renal cysts. No hydronephrosis or renal obstruction is noted. Slightly increased echogenicity of renal parenchyma bilaterally is noted suggesting medical renal disease. Minimal ascites is noted incidentally. Electronically Signed   By: Lupita Raider, M.D.   On: 08/07/2015 10:53   Dg Chest Port 1 View  Result Date: 08/12/2015 CLINICAL DATA:  Status post PICC line placement EXAM: PORTABLE CHEST 1 VIEW COMPARISON:  08/09/2015 FINDINGS: Cardiac shadow remains enlarged. A defibrillator is again seen and stable. A new right-sided PICC line is noted with catheter tip just below the cavoatrial junction in satisfactory position. Postsurgical changes are noted. The lungs are well aerated  bilaterally with minimal thickening of the right minor fissure. No focal infiltrate is seen. IMPRESSION: PICC line as described in satisfactory position. Electronically Signed   By: Alcide Clever M.D.   On: 08/12/2015 17:10  Dg Chest Portable 1 View  Result Date: 08/06/2015 CLINICAL DATA:  Shortness of breath for the last week, worsening today. EXAM: PORTABLE CHEST 1 VIEW COMPARISON:  04/14/2015 FINDINGS: Pacemaker defibrillator remains in place. Previous median sternotomy. Chronic cardiomegaly. Chronic aortic atherosclerosis. Small amount of pleural fluid. Venous hypertension with question early interstitial edema. IMPRESSION: Cardiomegaly. Question venous hypertension with early interstitial edema. Electronically Signed   By: Paulina Fusi M.D.   On: 08/06/2015 12:56   RLHC 08/11/2015:  Mid RCA lesion, 100 %stenosed.  Prox RCA lesion, 80 %stenosed.  SVG graft was visualized by angiography and is normal in caliber and anatomically normal.  Ost 1st Diag to 1st Diag lesion, 100 %stenosed.  Ost LM to LM lesion, 30 %stenosed.  Ost Cx to Prox Cx lesion, 100 %stenosed.  Suezanne Jacquet  LAD lesion, 70 %stenosed.  SVG.  Origin lesion, 100 %stenosed.  SVG graft was visualized by angiography and is large.  The graft exhibits mild .  There is no aortic valve stenosis.  Hemodynamic findings consistent with moderate pulmonary hypertension.   1. Right heart catheterization showed moderate to severe pulmonary hypertension with PA pressure of 55 reports 31 mmHg with pulmonary wedge pressure of 32 mmHg and severely reduced cardiac output at 2.95 with a cardiac index of 1.59. 2. Significant underlying three-vessel coronary artery disease with patent SVG to diagonal and SVG to right PDA. SVG to left circumflex is occluded. Native LAD has 70% proximal heavily calcified stenosis. 3. Severely reduced LV systolic function by echocardiogram. Left ventricular angiography was not performed.    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 without angina pectoris   Pulmonary HTN (HCC)   Centrilobular emphysema (HCC)   Acute on chronic systolic congestive heart failure (HCC)   Cardiorenal syndrome with renal failure - improving with IV Lasix   ARF (acute renal failure) (HCC)   Combined systolic and diastolic congestive heart failure (HCC)   Elevated troponin   DNR (do not resuscitate) discussion   Palliative care encounter   S/P CABG (coronary artery bypass graft)   S/P ICD (internal cardiac defibrillator) procedure    --- Acute on chronic systolic CHF LBBB S/p ICD Per patient, overall he is doing better and improving since admission. Evidence of volume overload mainly is based on neck vein engorgement and symptomatology, weight is up as well (? Reliability). He appears warm and wet. Patient reports that as an outpatient, his presumed dry weight is roughly 160-165 pounds (?). This is just per patient recollection. Based on review of records, it appears that patient has diuresed an additional ~ 2 L since the right heart catheterization 08/11/2015. Total is roughly ~6.5L. Trial of weaning off of milrinone infusion yesterday -- patient remained hemodynamically stable. Urine output slowed down however, despite continuation of Lasix boluses. So far today, he has put out 500cc. If UO remains stable, will continue with Lasix and rpt BMP this afternoon.  If UP drops again, to avoid higher doses of Lasix, esp with Crea going up slowly, recommend to resume milrinone infusion, and continue with IV Lasix as before. Goal is to diurese significantly then wean off inotrope support. Close monitoring of renal function and monitor for arrhythmia. Cont  losartan and Coreg Continue amiodarone IV for now. Per family discussion previously: They would like to continue to push forward with aggressive care at this time. Family unable to drive to [facility], little benefit in transferring to [facility] He is not a candidate for LVAD or other aggressive modalities given his severe emphysema   --CAD s/p CABG No ongoing CP LHC: Significant underlying three-vessel coronary artery disease with patent SVG to diagonal and SVG to right PDA. SVG to left circumflex is occluded. Native LAD has 70% proximal heavily calcified stenosis---Medical therapy recommended Continue medical therapy,review of records show GI bleed on asa . Cont BB, ARB, statin therapy   --Shortness of breath Multifactorial, at this point more predominantly emphysema We'll continue gentle diuresis with close monitoring of renal function   --Depression Mood continues to improve.  --COPD On inhalers  Severe underlying symptoms contributing to his shortness of breath  90 minutes of critical care time was used on the care of this patient.   Signed, Almond Lint, MD, [facility] 08/16/2015, 8:50 AM

## 2015-08-16 NOTE — Progress Notes (Signed)
RN spoke with MD, Dr. Cherlynn Kaiser, about patient's complaints of itching and request for bendryl. MD acknowledged and ordered benadryl 25 mg every 6 hours prn for itching.

## 2015-08-16 NOTE — Progress Notes (Signed)
Cardiologist checked in with RN about patient's condition. RN to still hold milrinone since patient still making urine and RN to stop amiodarone right now. Team will continue to monitor.

## 2015-08-16 NOTE — Progress Notes (Signed)
Alerted Dr. Sheryle Hail to pt.'s AM labs and lack of UOP O/N, discussed lung sounds and PMHx as well as earlier conversations with cariodology and prime docs. Asked if OK to give AM dose of lasix, MD ordered 500 mL NS bolus over an hour. Will give lasix and continue to monitor pt. Closely.

## 2015-08-17 LAB — BASIC METABOLIC PANEL
Anion gap: 10 (ref 5–15)
BUN: 43 mg/dL — AB (ref 6–20)
CALCIUM: 8.3 mg/dL — AB (ref 8.9–10.3)
CO2: 27 mmol/L (ref 22–32)
Chloride: 97 mmol/L — ABNORMAL LOW (ref 101–111)
Creatinine, Ser: 1.52 mg/dL — ABNORMAL HIGH (ref 0.61–1.24)
GFR calc Af Amer: 48 mL/min — ABNORMAL LOW (ref >60)
GFR, EST NON AFRICAN AMERICAN: 42 mL/min — AB (ref >60)
GLUCOSE: 109 mg/dL — AB (ref 65–99)
Potassium: 3.5 mmol/L (ref 3.5–5.1)
Sodium: 134 mmol/L — ABNORMAL LOW (ref 135–145)

## 2015-08-17 LAB — MAGNESIUM: Magnesium: 1.9 mg/dL (ref 1.7–2.4)

## 2015-08-17 LAB — GLUCOSE, CAPILLARY: GLUCOSE-CAPILLARY: 130 mg/dL — AB (ref 65–99)

## 2015-08-17 MED ORDER — FUROSEMIDE 40 MG PO TABS
40.0000 mg | ORAL_TABLET | Freq: Two times a day (BID) | ORAL | Status: DC
Start: 2015-08-17 — End: 2015-08-18
  Administered 2015-08-17 – 2015-08-18 (×3): 40 mg via ORAL
  Filled 2015-08-17 (×3): qty 1

## 2015-08-17 MED ORDER — ENSURE ENLIVE PO LIQD
237.0000 mL | Freq: Two times a day (BID) | ORAL | Status: DC
  Administered 2015-08-17 – 2015-08-18 (×2): 237 mL via ORAL

## 2015-08-17 MED ORDER — AMIODARONE HCL 200 MG PO TABS
200.0000 mg | ORAL_TABLET | Freq: Two times a day (BID) | ORAL | Status: DC
  Administered 2015-08-17 – 2015-08-18 (×3): 200 mg via ORAL
  Filled 2015-08-17 (×3): qty 1

## 2015-08-17 MED ORDER — ATORVASTATIN CALCIUM 20 MG PO TABS
20.0000 mg | ORAL_TABLET | Freq: Every day | ORAL | Status: DC
  Administered 2015-08-18: 20 mg via ORAL
  Filled 2015-08-17: qty 1

## 2015-08-17 NOTE — Progress Notes (Signed)
SUBJECTIVE:  He has not been sleeping well. Dyspnea is stable. He is off milrinone drip.   Vitals:   08/17/15 0603 08/17/15 0700 08/17/15 0800 08/17/15 0900  BP:  106/76 111/79 104/77  Pulse:  65 66 62  Resp:  20 (!) 22 19  Temp:   97.6 F (36.4 C)   TempSrc:   Oral   SpO2:  98% 93% 97%  Weight: 167 lb 12.3 oz (76.1 kg)     Height:        Intake/Output Summary (Last 24 hours) at 08/17/15 0939 Last data filed at 08/17/15 0700  Gross per 24 hour  Intake            423.6 ml  Output             1675 ml  Net          -1251.4 ml    LABS: Basic Metabolic Panel:  Recent Labs  81/82/99 0427  08/16/15 1735 08/17/15 0520  NA 134*  < > 130* 134*  K 3.9  < > 3.8 3.5  CL 97*  < > 93* 97*  CO2 29  < > 26 27  GLUCOSE 98  < > 135* 109*  BUN 39*  < > 42* 43*  CREATININE 1.44*  < > 1.49* 1.52*  CALCIUM 8.4*  < > 8.2* 8.3*  MG 1.9  --   --  1.9  < > = values in this interval not displayed. Liver Function Tests: No results for input(s): AST, ALT, ALKPHOS, BILITOT, PROT, ALBUMIN in the last 72 hours. No results for input(s): LIPASE, AMYLASE in the last 72 hours. CBC:  Recent Labs  08/15/15 0427  WBC 8.2  HGB 11.0*  HCT 32.3*  MCV 94.0  PLT 121*   Cardiac Enzymes: No results for input(s): CKTOTAL, CKMB, CKMBINDEX, TROPONINI in the last 72 hours. BNP: Invalid input(s): POCBNP D-Dimer: No results for input(s): DDIMER in the last 72 hours. Hemoglobin A1C: No results for input(s): HGBA1C in the last 72 hours. Fasting Lipid Panel: No results for input(s): CHOL, HDL, LDLCALC, TRIG, CHOLHDL, LDLDIRECT in the last 72 hours. Thyroid Function Tests: No results for input(s): TSH, T4TOTAL, T3FREE, THYROIDAB in the last 72 hours.  Invalid input(s): FREET3 Anemia Panel: No results for input(s): VITAMINB12, FOLATE, FERRITIN, TIBC, IRON, RETICCTPCT in the last 72 hours.   PHYSICAL EXAM General:  Ill-appearing but in no acute distress. HEENT:  Normocephalic and  atramatic Neck:  Mild JVD.  Lungs: Clear bilaterally to auscultation and percussion. Heart: HRRR . Normal S1 and S2 without gallops or murmurs.  Abdomen: Bowel sounds are positive, abdomen soft and non-tender  Msk:  Back normal, normal gait. Normal strength and tone for age. Extremities: No clubbing, cyanosis or edema.   Neuro: Alert and oriented X 3. Psych:  Good affect, responds appropriately  TELEMETRY: Reviewed telemetry pt in  ventricular paced rhythm with intermittent nonsustained ventricular tachycardia  ASSESSMENT AND PLAN:  1. Acute on chronic systolic heart failure with severely reduced LV systolic function. He is off milrinone drip with reasonable urine output. Renal function is close to baseline. His weight is also close to his outpatient weight. I resumed furosemide at 40 mg by mouth twice daily. Continue treatment with small dose carvedilol and losartan. Transferred the patient to telemetry. Start physical therapy. He is not a candidate for LVAD placement due to severe lung disease. I changed Amiodarone to 200 mg BID.   2. Coronary artery disease status post  CABG: LHC: Significant underlying three-vessel coronary artery disease with patent SVG to diagonal and SVG to right PDA. SVG to left circumflex was occluded. Native LAD has 70% proximal heavily calcified stenosis---Medical therapy recommended  3. COPD: Continue treatment with inhalers. The patient is on home oxygen and this should be continued. He is here.  4. Chronic kidney disease: Followed by nephrology. Creatinine is close to baseline.  I discussed the case with Dr. Daryll Drown, MD, [facility] 08/17/2015 9:39 AM

## 2015-08-17 NOTE — Care Management (Addendum)
Transferred from ICU. Off milrinone and amiodarone. Improved with diuresis. PT consult pending. Patient wanting to go home. Will plan to meet with patient and family. Prior to ICU they had chosen [facility] for home health needs. Monitoring need for Entresto.

## 2015-08-17 NOTE — Progress Notes (Signed)
Subjective:   Family at bedside. Hypoxic episodes overnight.  UOP 1725 Tmin 96.4 Na 134 Creatinine 1.52   Objective:  Vital signs in last 24 hours:  Temp:  [96.4 F (35.8 C)-98.1 F (36.7 C)] 97.6 F (36.4 C) (07/31 0800) Pulse Rate:  [62-76] 71 (07/31 1100) Resp:  [19-28] 28 (07/31 1100) BP: (83-117)/(60-83) 102/67 (07/31 1100) SpO2:  [93 %-100 %] 98 % (07/31 1100) Weight:  [76.1 kg (167 lb 12.3 oz)] 76.1 kg (167 lb 12.3 oz) (07/31 0603)  Weight change: -3.4 kg (-7 lb 7.9 oz) Filed Weights   08/15/15 0446 08/16/15 0425 08/17/15 0603  Weight: 79.8 kg (175 lb 14.8 oz) 79.5 kg (175 lb 4.3 oz) 76.1 kg (167 lb 12.3 oz)    Intake/Output:    Intake/Output Summary (Last 24 hours) at 08/17/15 1121 Last data filed at 08/17/15 0700  Gross per 24 hour  Intake            390.2 ml  Output             1675 ml  Net          -1284.8 ml     Physical Exam: General: Laying in bed, NAD.   HEENT Anicteric. Moist mucus membranes.  Neck Supple.  Pulm/lungs Normal effort, minimal basilar rales  CVS/Heart Irregular rhythm.  No rub/gallop.  Abdomen:  Soft, non distended, non tender.  Extremities: No edema.  Neurologic: Awake, alert, oriented.  Skin: No acute rashes.           Basic Metabolic Panel:   Recent Labs Lab 08/11/15 0521  08/13/15 0525 08/14/15 0555 08/15/15 0427 08/16/15 0404 08/16/15 1735 08/17/15 0520  NA 138  < > 137 133* 134* 133* 130* 134*  K 4.1  < > 3.4* 4.3 3.9 3.8 3.8 3.5  CL 101  < > 97* 96* 97* 95* 93* 97*  CO2 28  < > 30 29 29 28 26 27   GLUCOSE 96  < > 91 122* 98 103* 135* 109*  BUN 32*  < > 38* 41* 39* 42* 42* 43*  CREATININE 1.26*  < > 1.24 1.44* 1.44* 1.47* 1.49* 1.52*  CALCIUM 8.6*  < > 8.4* 8.5* 8.4* 8.2* 8.2* 8.3*  MG 1.8  --  1.8 1.9 1.9  --   --  1.9  < > = values in this interval not displayed.   CBC:  Recent Labs Lab 08/11/15 0521 08/12/15 0309 08/13/15 0525 08/15/15 0427  WBC 7.6 7.6 8.2 8.2  HGB 11.5* 11.8* 10.4* 11.0*   HCT 33.9* 36.1* 30.7* 32.3*  MCV 96.4 97.6 95.1 94.0  PLT 109* 120* 116* 121*      Microbiology:  Recent Results (from the past 720 hour(s))  C difficile quick scan w PCR reflex     Status: None   Collection Time: 08/06/15 11:39 AM  Result Value Ref Range Status   C Diff antigen NEGATIVE NEGATIVE Final   C Diff toxin NEGATIVE NEGATIVE Final   C Diff interpretation No C. difficile detected.  Final  MRSA PCR Screening     Status: None   Collection Time: 08/12/15 12:30 PM  Result Value Ref Range Status   MRSA by PCR NEGATIVE NEGATIVE Final    Comment:        The GeneXpert MRSA Assay (FDA approved for NASAL specimens only), is one component of a comprehensive MRSA colonization surveillance program. It is not intended to diagnose MRSA infection nor to guide or monitor treatment for MRSA  infections.     Coagulation Studies: No results for input(s): LABPROT, INR in the last 72 hours.  Urinalysis: No results for input(s): COLORURINE, LABSPEC, PHURINE, GLUCOSEU, HGBUR, BILIRUBINUR, KETONESUR, PROTEINUR, UROBILINOGEN, NITRITE, LEUKOCYTESUR in the last 72 hours.  Invalid input(s): APPERANCEUR    Imaging: No results found.   Medications:   . milrinone Stopped (08/15/15 1441)   . amiodarone  200 mg Oral BID  . antiseptic oral rinse  7 mL Mouth Rinse BID  . carvedilol  3.125 mg Oral BID WC  . enoxaparin (LOVENOX) injection  40 mg Subcutaneous Q24H  . folic acid  1 mg Oral Daily  . furosemide  40 mg Oral BID  . hydroxychloroquine  200 mg Oral BID  . leflunomide  20 mg Oral Daily  . levothyroxine  75 mcg Oral QAC breakfast  . losartan  25 mg Oral Daily  . magnesium oxide  400 mg Oral Daily  . mirtazapine  7.5 mg Oral QHS  . mometasone-formoterol  2 puff Inhalation BID  . montelukast  10 mg Oral Daily  . multivitamin with minerals   Oral Daily  . pantoprazole  40 mg Oral Daily  . simvastatin  40 mg Oral Daily  . sodium chloride flush  3 mL Intravenous Q12H  .  sodium chloride flush  3 mL Intravenous Q12H   sodium chloride, acetaminophen **OR** acetaminophen, diphenhydrAMINE, ipratropium-albuterol, morphine injection, nitroGLYCERIN, ondansetron **OR** ondansetron (ZOFRAN) IV, phenol, polyethylene glycol, sodium chloride flush  Assessment/ Plan:  82 y.o. male with medical problems of COPD requiring home oxygen, rheumatoid arthritis, hypertension, severe coronary disease with five-vessel CABG in 1995, congestive heart failure with EF 35-40%, pacemaker, AICD, history of goiter, history of renal stones in the remote past, who was admitted to [facility] on 08/06/2015 for evaluation of shortness of breath that started about a week ago.   1. Acute renal failure: secondary to acute cardiorenal syndrome. Creatinine at baseline.  - continue furosemide: change to PO  2. Hypertension:  - losartan and carvedilol  3. Acute exacerbation of Systolic Congestive Heart Failure - appreciate cardiology input.  - amiodarone    LOS: 11 Tom Nguyen 7/31/201711:21 AM

## 2015-08-17 NOTE — Progress Notes (Signed)
[employer] [facility] [place] [facility]   PATIENT NAME: Tom Nguyen    MR#:  [id number]  DATE OF BIRTH:  [birth year]  SUBJECTIVE:   Patient here due to decompensated CHF and acute renal failure. Has been Off milrinone now for the past 2 days. Also tapered off the amiodarone drip. Shortness of breath is stable. Family at bedside. Did not sleep well the past couple days.  REVIEW OF SYSTEMS:    Review of Systems  Constitutional: Negative for chills and fever.  HENT: Negative for congestion and tinnitus.   Eyes: Negative for blurred vision and double vision.  Respiratory: Negative for cough, shortness of breath and wheezing.   Cardiovascular: Negative for chest pain, orthopnea and PND.  Gastrointestinal: Negative for abdominal pain, diarrhea, nausea and vomiting.  Genitourinary: Negative for dysuria and hematuria.  Neurological: Negative for dizziness, sensory change and focal weakness.  All other systems reviewed and are negative.   Nutrition: Heart Healthy Tolerating Diet: Yes Tolerating PT: Await Eval   DRUG ALLERGIES:  No Known Allergies  VITALS:  Blood pressure 110/64, pulse 67, temperature 97.5 F (36.4 C), temperature source Oral, resp. rate 20, height 5\' 8"  (1.727 m), weight 76.1 kg (167 lb 12.3 oz), SpO2 98 %.  PHYSICAL EXAMINATION:   Physical Exam  GENERAL:  82 y.o.-year-old patient lying in bed in mild resp. Distress. EYES: Pupils equal, round, reactive to light and accommodation. No scleral icterus. Extraocular muscles intact.  HEENT: Head atraumatic, normocephalic. Oropharynx and nasopharynx clear.  NECK:  Supple, no jugular venous distention. No thyroid enlargement, no tenderness.  LUNGS: Normal breath sounds bilaterally, no wheezing, rhonchi, minimal bibasilar rales. No use of accessory muscles of respiration.  CARDIOVASCULAR: S1, S2 normal. + II/VI SEM at base, No rubs, or gallops.  ABDOMEN: Soft, nontender, nondistended. Bowel sounds present. No  organomegaly or mass.  EXTREMITIES: No cyanosis, clubbing or edema b/l.    NEUROLOGIC: Cranial nerves II through XII are intact. No focal Motor or sensory deficits b/l.  Globally weak. PSYCHIATRIC: The patient is alert and oriented x 3.  SKIN: No obvious rash, lesion, or ulcer.    LABORATORY PANEL:   CBC  Recent Labs Lab 08/15/15 0427  WBC 8.2  HGB 11.0*  HCT 32.3*  PLT 121*   ------------------------------------------------------------------------------------------------------------------  Chemistries   Recent Labs Lab 08/17/15 0520  NA 134*  K 3.5  CL 97*  CO2 27  GLUCOSE 109*  BUN 43*  CREATININE 1.52*  CALCIUM 8.3*  MG 1.9   ------------------------------------------------------------------------------------------------------------------  Cardiac Enzymes No results for input(s): TROPONINI in the last 168 hours. ------------------------------------------------------------------------------------------------------------------  RADIOLOGY:  No results found.   ASSESSMENT AND PLAN:   82 year old male with past medical history of systolic CHF, chronic kidney disease stage III, history of previous MI, ischemic cardiac myopathy, hypertension, hyperlipidemia, COPD who presented to the hospital due to shortness of breath and noted to be in CHF.  1. Acute on chronic combined diastolic/systolic CHF-clinically much improved with IV diuresis with Lasix and also with milrinone. -Patient is about 8 L negative since admission. -Urine output has remained stable on IV diuresis w/out Milrinone and now switched to Oral lasix by Cardiology.  - continue Coreg, losartan.  2. Acute on chronic respiratory failure with hypoxia-secondary to CHF. -Much improved with diuresis, wean O2 as tolerated.  3. COPD-no acute exacerbation. Continue Dulera  4. History of rheumatoid arthritis-continue Plaquenil.  5. GERD-continue Protonix.  6. Hyperlipidemia-continue simvastatin.  7.  Acute renal failure-secondary to CHF and cardiorenal  hemodynamics. Renal ultrasound negative for obstruction. - Urine output stable with IV diuretics without Inotropes.  Cont. Oral lasix as per Cards.   Await PT eval   All the records are reviewed and case discussed with Care Management/Social Workerr. Management plans discussed with the patient, family and they are in agreement.  CODE STATUS: partial/Limited  DVT Prophylaxis: Lovenox  TOTAL TIME TAKING CARE OF THIS PATIENT: 25 minutes.   POSSIBLE D/C IN 2-3 DAYS, DEPENDING ON CLINICAL CONDITION.   Houston Siren M.D on 08/17/2015 at 2:13 PM  Between 7am to 6pm - Pager - [phone]  After 6pm go to [email or web address] - password [email or web address] [facility]  [facility]  Office  [phone]  CC: Primary care physician; [name], DO

## 2015-08-17 NOTE — Progress Notes (Signed)
Initial Nutrition Assessment  DOCUMENTATION CODES:   Not applicable  INTERVENTION:  -Recommend addition of Ensure Enlive po BID, each supplement provides 350 kcal and 20 grams of protein -Cater to pt preferences   NUTRITION DIAGNOSIS:   Inadequate oral intake related to poor appetite, acute illness as evidenced by meal completion < 50%.  GOAL:   Patient will meet greater than or equal to 90% of their needs  MONITOR:   PO intake, Supplement acceptance, Weight trends, Labs  REASON FOR ASSESSMENT:   LOS    ASSESSMENT:    82 yo male admitted with acute on chronic CHF with acute respiratory failure, ARF  Past Medical History:  Diagnosis Date  . Arthritis   . Asthma   . CAD (coronary artery disease)    a. s/p CABG 1995; b. stress test 2006: inf post lat infarction w/ peri-infarct ischemia, AK inferior wall & HK inferolat wall, EF 28%. unchanged from prior study 07/2002.  Marland Kitchen Chronic combined systolic and diastolic CHF, NYHA class 3 (HCC)    a. 2007 EF 40%; b. 10/2008: EF 35% by echo; c. echo 10/16: EF 45-50%, sev inf/post wall HK, conduction abnormality, mild MR, mod dilated LA, PASP 47 mm Hg  . Chronic diarrhea   . Chronic respiratory failure (HCC)   . COPD (chronic obstructive pulmonary disease) (HCC)    on 2L o2 at home  . Emphysema of lung (HCC)   . History of GI bleed   . Hyperlipidemia   . Hypertension   . Hypothyroid   . Ischemic cardiomyopathy   . LBBB (left bundle branch block)   . MI (myocardial infarction) (HCC) 1995  . Sleep apnea    a. on CPAP    Diet Order:  Diet Heart Room service appropriate? Yes; Fluid consistency: Thin   Energy Intake: recorded po intake 47% on average  Skin:  Reviewed, no issues  Last BM:  7/30   Labs: sodium 134, potassium wdl  Meds: MVI, remeron, lasix  Height:   Ht Readings from Last 1 Encounters:  08/12/15 5\' 8"  (1.727 m)    Weight: no major weight loss trend  Wt Readings from Last 1 Encounters:  08/17/15 167  lb 12.3 oz (76.1 kg)   Wt Readings from Last 10 Encounters:  08/17/15 167 lb 12.3 oz (76.1 kg)  08/06/15 170 lb (77.1 kg)  06/17/15 163 lb 3 oz (74 kg)  04/28/15 158 lb 8 oz (71.9 kg)  04/23/15 160 lb 2 oz (72.6 kg)  04/13/15 164 lb 11.2 oz (74.7 kg)  03/27/15 176 lb 6.4 oz (80 kg)  03/05/15 171 lb 3.2 oz (77.7 kg)  12/22/14 166 lb 4 oz (75.4 kg)  12/16/14 161 lb 4 oz (73.1 kg)   BMI:  Body mass index is 25.51 kg/m.  Estimated Nutritional Needs:   Kcal:  1900-2280 kcals   Protein:  76-91 g   Fluid:  >/= 1.9 L  EDUCATION NEEDS:   No education needs identified at this time  12/18/14 MS, RD, LDN [phone] Pager  [phone] Weekend/On-Call Pager

## 2015-08-17 NOTE — Evaluation (Signed)
Physical Therapy Re-Evaluation Patient Details Name: Tom Nguyen MRN: [id number] DOB: [birth year] Today's Date: 08/17/2015   History of Present Illness  82 y/o male presenting with progressively worsening SOB. Admitted with acute on chronic CHF and acute renal failure. Hospital stay was complicated by chest pain, and pt underwent right and left heart cardiac catheterization 7/25, which revealed severely reduced LV systolic function. Pt transferred to the CCU for medical management. PMH significant for CAD s/p 1995 CABG, HTN, COPD, h.o pacemaker 2005, and ischemic cardiomyopathy.    PT Comments  Pt is with significant decline in functional mobility/activity tolerance since initial PT evaluation 7/21 during which he was d/c'ed in house with no PT needs. New PT consult received and pt seen for PT re-eval. Currently, pt is requiring min A for supine > sit and CGA for transfers and ambulation x71ft with RW. Ambulation is limited by significant fatigue/SOB although SaO2 maintained >93% without supplemental O2.  Pt presents with generalized weakness/deconditioning which limits his independence with and tolerance for functional mobility. Pt would benefit from STR to address noted impairments and limitations, though per chart review, pt and family wish for pt to return home upon d/c. Will continue with skilled acute PT in hopes to progress pt to a mobility level at which he would be safe to d/c home with HHPT.       Follow Up Recommendations SNF (pending progress)    Equipment Recommendations  Rolling walker with 5" wheels    Recommendations for Other Services       Precautions / Restrictions Precautions Precautions: Fall;ICD/Pacemaker Restrictions Weight Bearing Restrictions: No      Mobility  Bed Mobility Overal bed mobility: Needs Assistance Bed Mobility: Supine to Sit Supine to sit: Min assist General bed mobility comments: Min A at the trunk to assist into sitting. Increased time  required.  Transfers Overall transfer level: Needs assistance Equipment used: Rolling walker (2 wheeled) Transfers: Sit to/from Stand Sit to Stand: Min guard General transfer comment: Vc's for hand/foot placement and DME use. Min guard for safety, no physical assist. Increased time required but overall safe transfer.  Ambulation/Gait Ambulation/Gait assistance: Min guard Ambulation Distance (Feet): 5 Feet (bed > chair) Assistive device: Rolling walker (2 wheeled) Gait Pattern/deviations: Decreased stride length;Shuffle;Narrow base of support Gait velocity: Decreased General Gait Details: Pt able to peform standing marching at RW, but appears considerably fatiguing and thus ambulation was only performed > chair. Pt endorses this to be very difficult d/t generalized deconditioning and feeling SOB. Vc's for technique and min guard for safety.  Stairs    Wheelchair Mobility    Modified Rankin (Stroke Patients Only)       Balance Overall balance assessment: Needs assistance Sitting-balance support: Feet supported Sitting balance-Leahy Scale: Good Standing balance support: Bilateral upper extremity supported (on RW) Standing balance-Leahy Scale: Fair      Pertinent Vitals/Pain Pain Assessment: No/denies pain  Pt not on supplemental O2 upon room entry. Left off for duration of our session and O2 sats maintained >93%.     Home Living Family/patient expects to be discharged to:: Private residence Living Arrangements: Spouse/significant other;Children Available Help at Discharge: Family Type of Home: House Home Access: Stairs to enter Entrance Stairs-Rails: Can reach both Entrance Stairs-Number of Steps: 2 Home Layout: Two level;Able to live on main level with bedroom/bathroom Home Equipment: Dan Humphreys - 2 wheels;Walker - 4 wheels;Cane - single point      Prior Function Level of Independence: Independent with assistive device(s);Independent  Comments: Pt on  home O2, does not  normally need an AD        Extremity/Trunk Assessment   Upper Extremity Assessment: Generalized weakness  Lower Extremity Assessment: Generalized weakness  Cervical / Trunk Assessment: Normal    Communication   Communication: No difficulties  Cognition Arousal/Alertness: Awake/alert;Lethargic (Asleep upon room entry but easily aroused with sternal rub. Pt initially lethargic but with increased time becomes more alert/interactive.) Behavior During Therapy: WFL for tasks assessed/performed Overall Cognitive Status: Within Functional Limits for tasks assessed    General Comments Nursing cleared pt for participation in physical therapy.  Pt agreeable to PT session.     Exercises General Exercises - Lower Extremity Ankle Circles/Pumps: AROM Quad Sets: AROM Gluteal Sets: AROM Short Arc Quad: AROM Heel Slides: AROM Hip ABduction/ADduction: AROM  All exercises performed bilaterally x 10 reps in supine.       Assessment/Plan    PT Assessment Patient needs continued PT services  PT Diagnosis Generalized weakness;Difficulty walking   PT Problem List Decreased strength;Decreased activity tolerance;Decreased balance;Decreased mobility;Decreased knowledge of use of DME  PT Treatment Interventions DME instruction;Gait training;Stair training;Functional mobility training;Therapeutic activities;Therapeutic exercise;Balance training;Patient/family education   PT Goals (Current goals can be found in the Care Plan section) Acute Rehab PT Goals Patient Stated Goal: To go home PT Goal Formulation: With patient Time For Goal Achievement: 08/31/15 Potential to Achieve Goals: Fair    Frequency Min 2X/week   Barriers to discharge Assist levels, activity tolerance         End of Session Equipment Utilized During Treatment: Gait belt Activity Tolerance: Patient limited by fatigue Patient left: in chair;with call bell/phone within reach;with family/visitor present Nurse Communication:  Mobility status;Precautions         Time: 1962-2297 PT Time Calculation (min) (ACUTE ONLY): 26 min   Charges:         PT G Codes:        Arlett Goold, SPT 08/17/2015, 11:44 AM

## 2015-08-17 NOTE — Progress Notes (Signed)
ELECTROLYTE CONSULT NOTE - Follow up  Pharmacy Consult for Electrolyte monitoring and replacement Indication: Lasix drip- (now changed to IV q8h)  No Known Allergies  Patient Measurements: Height: 5\' 8"  (172.7 cm) Weight: 167 lb 12.3 oz (76.1 kg) IBW/kg (Calculated) : 68.4  Vital Signs: Temp: 97.6 F (36.4 C) (07/31 0800) Temp Source: Oral (07/31 0800) BP: 102/67 (07/31 1100) Pulse Rate: 71 (07/31 1100) Intake/Output from previous day: 07/30 0701 - 07/31 0700 In: 657 [P.O.:490; I.V.:167] Out: 1975 [Urine:1975] Intake/Output from this shift: No intake/output data recorded.  Labs:  Recent Labs  08/15/15 0427  WBC 8.2  HGB 11.0*  HCT 32.3*  PLT 121*     Recent Labs  08/15/15 0427 08/16/15 0404 08/16/15 1735 08/17/15 0520  NA 134* 133* 130* 134*  K 3.9 3.8 3.8 3.5  CL 97* 95* 93* 97*  CO2 29 28 26 27   GLUCOSE 98 103* 135* 109*  BUN 39* 42* 42* 43*  CREATININE 1.44* 1.47* 1.49* 1.52*  CALCIUM 8.4* 8.2* 8.2* 8.3*  MG 1.9  --   --  1.9   Estimated Creatinine Clearance: 37.5 mL/min (by C-G formula based on SCr of 1.52 mg/dL).   No results for input(s): GLUCAP in the last 72 hours.   Assessment: 82 yo male with AECHF on lasix. Current orders for lasix 40 mg IV Q 12H.  Plan:  Electrolytes are WNL. Patient no longer on milrinone drip. Will continue to follow K daily but may switch to every other day if K remains WNL and patient remains off milrinone.   , PharmD Clinical Pharmacist  08/17/2015 11:19 AM

## 2015-08-18 DIAGNOSIS — R531 Weakness: Secondary | ICD-10-CM

## 2015-08-18 LAB — BASIC METABOLIC PANEL
Anion gap: 8 (ref 5–15)
BUN: 42 mg/dL — ABNORMAL HIGH (ref 6–20)
CALCIUM: 8.2 mg/dL — AB (ref 8.9–10.3)
CO2: 29 mmol/L (ref 22–32)
CREATININE: 1.42 mg/dL — AB (ref 0.61–1.24)
Chloride: 96 mmol/L — ABNORMAL LOW (ref 101–111)
GFR calc Af Amer: 52 mL/min — ABNORMAL LOW (ref >60)
GFR calc non Af Amer: 45 mL/min — ABNORMAL LOW (ref >60)
GLUCOSE: 93 mg/dL (ref 65–99)
Potassium: 3.4 mmol/L — ABNORMAL LOW (ref 3.5–5.1)
Sodium: 133 mmol/L — ABNORMAL LOW (ref 135–145)

## 2015-08-18 LAB — CBC
HCT: 32.4 % — ABNORMAL LOW (ref 40.0–52.0)
HEMOGLOBIN: 11.3 g/dL — AB (ref 13.0–18.0)
MCH: 32.8 pg (ref 26.0–34.0)
MCHC: 34.9 g/dL (ref 32.0–36.0)
MCV: 94 fL (ref 80.0–100.0)
PLATELETS: 126 10*3/uL — AB (ref 150–440)
RBC: 3.45 MIL/uL — ABNORMAL LOW (ref 4.40–5.90)
RDW: 18.4 % — ABNORMAL HIGH (ref 11.5–14.5)
WBC: 8.7 10*3/uL (ref 3.8–10.6)

## 2015-08-18 MED ORDER — FUROSEMIDE 40 MG PO TABS
40.0000 mg | ORAL_TABLET | Freq: Two times a day (BID) | ORAL | 1 refills | Status: DC
Start: 2015-08-18 — End: 2015-08-26

## 2015-08-18 MED ORDER — CARVEDILOL 6.25 MG PO TABS: 6.2500 mg | ORAL_TABLET | Freq: Two times a day (BID) | ORAL | 1 refills | Status: DC

## 2015-08-18 MED ORDER — AMIODARONE HCL 200 MG PO TABS: 200.0000 mg | ORAL_TABLET | Freq: Two times a day (BID) | ORAL | 0 refills | Status: DC

## 2015-08-18 MED ORDER — LOSARTAN POTASSIUM 25 MG PO TABS: 25.0000 mg | ORAL_TABLET | Freq: Every day | ORAL | 1 refills | Status: DC

## 2015-08-18 MED ORDER — POTASSIUM CHLORIDE CRYS ER 20 MEQ PO TBCR
20.0000 meq | EXTENDED_RELEASE_TABLET | Freq: Once | ORAL | Status: AC
  Administered 2015-08-18: 20 meq via ORAL
  Filled 2015-08-18: qty 1

## 2015-08-18 NOTE — Care Management (Signed)
Family would like to take patient home today. [facility] notified. Ordered bedside commode at daughters request. They will decide if they need EMS once PT evaluates patient.

## 2015-08-18 NOTE — Progress Notes (Signed)
Physical Therapy Treatment Patient Details Name: Tom Nguyen MRN: [id number] DOB: May 30, 1932 Today's Date: 08/18/2015    History of Present Illness 82 y/o male presenting with progressively worsening SOB. Admitted with acute on chronic CHF and acute renal failure. Hospital stay was complicated by chest pain, and pt underwent right and left heart cardiac catheterization 7/25, which revealed severely reduced LV systolic function. Pt transferred to the CCU for medical management. PMH significant for CAD s/p 1995 CABG, HTN, COPD, h.o pacemaker 2005, and ischemic cardiomyopathy.     PT Comments    Pt making significant progress towards gait goal, able to ambulate 125ft with RW, CGA, and close chair follow for safety. Distance limited by fatigue/feeling SOB, SaO2 screened to be 99% on RA. Pt performs all functional mobility at a supervision/min guard level and should be safe to d/c home with supervision/assist for OOB activity, HHPT, and use of RW. CM notified and placing order for RW. Pt's wife and daughter were present during session and educated in proper DME use/mobility recommendations; both verbalize understanding.    Follow Up Recommendations  Home health PT;Supervision for mobility/OOB     Equipment Recommendations  Rolling walker with 5" wheels    Recommendations for Other Services       Precautions / Restrictions Precautions Precautions: Fall;ICD/Pacemaker Restrictions Weight Bearing Restrictions: No    Mobility  Bed Mobility  General bed mobility comments: Received in chair, returned to chair; not assessed  Transfers Overall transfer level: Needs assistance Equipment used: Rolling walker (2 wheeled) Transfers: Sit to/from Stand Sit to Stand: Supervision General transfer comment: Increased time required but overall safe transfer. Vc's for DME use.  Ambulation/Gait Ambulation/Gait assistance: Min guard Ambulation Distance (Feet): 125 Feet Assistive device: Rolling walker  (2 wheeled) Gait Pattern/deviations: Step-through pattern;Decreased stride length;Narrow base of support Gait velocity: Decreased General Gait Details: Pt demonstrates safety with RW; no instability or LOB noted. Distance limited by fatigue/feeling SOB. SaO2 99% on room air.    Stairs    Wheelchair Mobility    Modified Rankin (Stroke Patients Only)          Cognition Arousal/Alertness: Awake/alert Behavior During Therapy: WFL for tasks assessed/performed Overall Cognitive Status: Within Functional Limits for tasks assessed    Exercises      General Comments Nursing cleared pt for participation in physical therapy.  Pt agreeable to PT session.      Pertinent Vitals/Pain Pain Assessment: No/denies pain           PT Goals (current goals can now be found in the care plan section) Acute Rehab PT Goals Patient Stated Goal: To go home PT Goal Formulation: With patient Time For Goal Achievement: 08/31/15 Potential to Achieve Goals: Fair Progress towards PT goals: Progressing toward goals    Frequency  Min 2X/week    PT Plan Current plan remains appropriate       End of Session Equipment Utilized During Treatment: Gait belt Activity Tolerance: Patient limited by fatigue Patient left: in chair;with call bell/phone within reach;with chair alarm set;with family/visitor present     Time: 5956-3875 PT Time Calculation (min) (ACUTE ONLY): 13 min  Charges:                       G Codes:      Socorro Ebron, SPT 08/18/2015, 3:57 PM

## 2015-08-18 NOTE — Progress Notes (Signed)
Daily Progress Note   Patient Name: Tom Nguyen       Date: 08/18/2015 DOB: Apr 19, 1932  Age: 82 y.o. MRN#: [id number] Attending Physician: Houston Siren, MD Primary Care Physician: [name], DO Admit Date: 08/06/2015  Reason for Consultation/Follow-up: Establishing goals of care and Psychosocial/spiritual support  Subjective:  -continued discussion with patient and his wife, also son in law at bedside, regarding current medical situation, anticipatory care needs and important advanced directive decisions specifically regarding his current activated ICD  - verbalized concerns regarding home care needs especially that wife tells me she is sicker than her husband but she "will do whatever I have to"  -discussed the difference between hospice services and home health service, I informed family that the patient is hospice eligible,  the difference between a aggressive medical intervention path  and a palliative comfort care path for this patient at this time was had.  Values and goals of care important to patient and family were attempted to be elicited.  -questions and concerns addressed.   Family encouraged to call with questions or concerns.  I have left messages and my contact information for daughter, encouraging her to call if I can be of assistance.  Patient's wife visibly irriitated with conversation, tells me she is "taking him home and that is all there is to it", "I do not need my daughter's approval".  I attempted to help her understand importance of a successful discharge plan in hopes of providing the best patient centered care for her husband   Length of Stay: 12  Current Medications: Scheduled Meds:  . amiodarone  200 mg Oral BID  . antiseptic oral rinse  7 mL Mouth Rinse  BID  . atorvastatin  20 mg Oral Daily  . carvedilol  3.125 mg Oral BID WC  . enoxaparin (LOVENOX) injection  40 mg Subcutaneous Q24H  . feeding supplement (ENSURE ENLIVE)  237 mL Oral BID BM  . folic acid  1 mg Oral Daily  . furosemide  40 mg Oral BID  . hydroxychloroquine  200 mg Oral BID  . leflunomide  20 mg Oral Daily  . levothyroxine  75 mcg Oral QAC breakfast  . losartan  25 mg Oral Daily  . magnesium oxide  400 mg Oral Daily  . mirtazapine  7.5 mg Oral  QHS  . mometasone-formoterol  2 puff Inhalation BID  . montelukast  10 mg Oral Daily  . multivitamin with minerals   Oral Daily  . pantoprazole  40 mg Oral Daily  . sodium chloride flush  3 mL Intravenous Q12H  . sodium chloride flush  3 mL Intravenous Q12H    Continuous Infusions:    PRN Meds: sodium chloride, acetaminophen **OR** acetaminophen, diphenhydrAMINE, ipratropium-albuterol, morphine injection, nitroGLYCERIN, ondansetron **OR** ondansetron (ZOFRAN) IV, phenol, polyethylene glycol, sodium chloride flush  Physical Exam  Constitutional: He appears lethargic. He appears ill.  Cardiovascular: Normal rate, regular rhythm and normal heart sounds.   Pulmonary/Chest: Tachypnea noted. He has decreased breath sounds in the right lower field and the left lower field.  Abdominal: Soft. Normal appearance and bowel sounds are normal.  Musculoskeletal:  generalized weakness and muscle atrophy  Neurological: He appears lethargic.  Skin: Skin is warm and dry.            Vital Signs: BP 120/67 (BP Location: Left Arm)   Pulse 71   Temp 97.5 F (36.4 C) (Oral)   Resp 18   Ht 5\' 8"  (1.727 m)   Wt 75.2 kg (165 lb 11.2 oz)   SpO2 97%   BMI 25.19 kg/m  SpO2: SpO2: 97 % O2 Device: O2 Device: Not Delivered O2 Flow Rate: O2 Flow Rate (L/min): 3 L/min  Intake/output summary:  Intake/Output Summary (Last 24 hours) at 08/18/15 0919 Last data filed at 08/18/15 10/18/15  Gross per 24 hour  Intake              240 ml  Output               400 ml  Net             -160 ml   LBM: Last BM Date: 08/17/15 Baseline Weight: Weight: 75.3 kg (166 lb) Most recent weight: Weight: 75.2 kg (165 lb 11.2 oz)       Palliative Assessment/Data:  30% at best      Patient Active Problem List   Diagnosis Date Noted  . S/P CABG (coronary artery bypass graft)   . S/P ICD (internal cardiac defibrillator) procedure   . DNR (do not resuscitate) discussion 08/13/2015  . Palliative care encounter 08/13/2015  . Acute respiratory failure with hypoxia (HCC) - secondary to combined COPD and acute on chronic heart failure 08/08/2015  . Cardiorenal syndrome with renal failure - improving with IV Lasix 08/08/2015  . ARF (acute renal failure) (HCC)   . Combined systolic and diastolic congestive heart failure (HCC)   . Elevated troponin   . Acute on chronic systolic congestive heart failure (HCC) 08/07/2015  . Dyspnea 08/06/2015  . Acute on chronic renal failure (HCC)   . Diarrhea   . Atherosclerosis of coronary artery bypass graft of native heart without angina pectoris   . Pulmonary HTN (HCC)   . Centrilobular emphysema (HCC)   . Anemia 06/17/2015  . Weight loss 04/24/2015  . Respiratory failure (HCC) 04/13/2015  . Preventative health care 10/21/2014  . HLD (hyperlipidemia) 10/21/2014  . HTN (hypertension) 10/21/2014  . CKD (chronic kidney disease) stage 3, GFR 30-59 ml/min 10/21/2014  . GERD (gastroesophageal reflux disease) 10/21/2014  . Osteoporosis 10/21/2014  . CAD (coronary artery disease) 10/21/2014  . COPD (chronic obstructive pulmonary disease) (HCC) 10/21/2014  . Rheumatoid arthritis (HCC) 10/21/2014  . Rectal bleeding 10/21/2014  . Hypothyroidism 10/21/2014  . Gout 10/21/2014  . Cardiomyopathy, ischemic 10/21/2014  Palliative Care Assessment & Plan    Assessment: -acute on chronic systolic heart failure with severely reduced LV function, CAD, COPD, CKD.  Long term poor prognosis, high risk for decompensation.     Recommendations/Plan:  Home with Home health today  at family request   Code Status:    Code Status Orders        Start     Ordered   08/13/15 1144  Limited resuscitation (code)  Continuous    Question Answer Comment  In the event of cardiac or respiratory ARREST: Initiate Code Blue, Call Rapid Response Yes   In the event of cardiac or respiratory ARREST: Perform CPR No   In the event of cardiac or respiratory ARREST: Perform Intubation/Mechanical Ventilation No   In the event of cardiac or respiratory ARREST: Use NIPPV/BiPAp only if indicated Yes   In the event of cardiac or respiratory ARREST: Administer ACLS medications if indicated Yes   In the event of cardiac or respiratory ARREST: Perform Defibrillation or Cardioversion if indicated No      08/13/15 1143    Code Status History    Date Active Date Inactive Code Status Order ID Comments User Context   08/06/2015  1:56 PM 08/13/2015 11:43 AM Full Code [id number]  Enid Baas, MD ED   04/13/2015  3:19 PM 04/16/2015  6:11 PM DNR [id number]  Adrian Saran, MD Inpatient       Prognosis:  -less than 6 months  Discharge Planning:  Home with Home health services  Care plan was discussed with Dr Cherlynn Kaiser  Thank you for allowing the Palliative Medicine Team to assist in the care of this patient.   Time In: 1400 Time Out: 1435 Total Time 35 min Prolonged Time Billed  no      Greater than 50%  of this time was spent counseling and coordinating care related to the above assessment and plan.  Lorinda Creed, NP  Please contact Palliative Medicine Team phone at [phone] for questions and concerns.

## 2015-08-18 NOTE — Progress Notes (Signed)
ELECTROLYTE CONSULT NOTE - Follow up  Pharmacy Consult for Electrolyte monitoring and replacement  No Known Allergies  Patient Measurements: Height: 5\' 8"  (172.7 cm) Weight: 165 lb 11.2 oz (75.2 kg) IBW/kg (Calculated) : 68.4  Vital Signs: Temp: 97.5 F (36.4 C) (08/01 0407) Temp Source: Oral (08/01 0407) BP: 120/67 (08/01 0407) Pulse Rate: 71 (08/01 0411) Intake/Output from previous day: 07/31 0701 - 08/01 0700 In: 240 [P.O.:240] Out: 150 [Urine:150] Intake/Output from this shift: No intake/output data recorded.  Labs:  Recent Labs  08/18/15 0550  WBC 8.7  HGB 11.3*  HCT 32.4*  PLT 126*     Recent Labs  08/16/15 1735 08/17/15 0520 08/18/15 0550  NA 130* 134* 133*  K 3.8 3.5 3.4*  CL 93* 97* 96*  CO2 26 27 29   GLUCOSE 135* 109* 93  BUN 42* 43* 42*  CREATININE 1.49* 1.52* 1.42*  CALCIUM 8.2* 8.3* 8.2*  MG  --  1.9  --    Estimated Creatinine Clearance: 40.1 mL/min (by C-G formula based on SCr of 1.42 mg/dL).    Recent Labs  08/17/15 2120  GLUCAP 130*     Assessment: 82 yo male with AECHF on lasix. Current orders for lasix 40 mg IV Q 12H.  Plan:  Potassium 3.4, will give potassium chloride 2121 PO x 1, recheck with AM labs.  96, PharmD Clinical Pharmacist   08/18/2015 7:41 AM

## 2015-08-18 NOTE — Progress Notes (Signed)
Handoff from Amy RN, she has already gone over discharge paperwork and removed tele/IVs. Dan Humphreys just delivered to room. EMS called for transport. Family at bedside. No questions or complaints at this time.

## 2015-08-18 NOTE — Care Management Important Message (Signed)
Important Message  Patient Details  Name: Tom Nguyen MRN: [id number] Date of Birth: 07-May-1932   Medicare Important Message Given:  Yes    Marily Memos, RN 08/18/2015, 3:04 PM

## 2015-08-18 NOTE — Discharge Summary (Signed)
[employer] - [place] at [facility]   PATIENT NAME: Kayler Rise    MR#:  [id number]  DATE OF BIRTH:  [birth year]  DATE OF ADMISSION:  08/06/2015 ADMITTING PHYSICIAN: Enid Baas, MD  DATE OF DISCHARGE: 08/18/2015  PRIMARY CARE PHYSICIAN: [name], DO    ADMISSION DIAGNOSIS:  Dehydration [E86.0] ARF (acute renal failure) (HCC) [N17.9] Elevated troponin [R79.89] Combined systolic and diastolic congestive heart failure, unspecified congestive heart failure chronicity (HCC) [I50.40] Diarrhea, unspecified type [R19.7]  DISCHARGE DIAGNOSIS:  Principal Problem:   Acute respiratory failure with hypoxia (HCC) - secondary to combined COPD and acute on chronic heart failure Active Problems:   Cardiomyopathy, ischemic   Acute on chronic renal failure (HCC)   Diarrhea   Atherosclerosis of coronary artery bypass graft of native heart without angina pectoris   Pulmonary HTN (HCC)   Centrilobular emphysema (HCC)   Acute on chronic systolic congestive heart failure (HCC)   Cardiorenal syndrome with renal failure - improving with IV Lasix   ARF (acute renal failure) (HCC)   Combined systolic and diastolic congestive heart failure (HCC)   Elevated troponin   DNR (do not resuscitate) discussion   Palliative care encounter   S/P CABG (coronary artery bypass graft)   S/P ICD (internal cardiac defibrillator) procedure   SECONDARY DIAGNOSIS:   Past Medical History:  Diagnosis Date  . Arthritis   . Asthma   . CAD (coronary artery disease)    a. s/p CABG 1995; b. stress test 2006: inf post lat infarction w/ peri-infarct ischemia, AK inferior wall & HK inferolat wall, EF 28%. unchanged from prior study 07/2002.  Marland Kitchen Chronic combined systolic and diastolic CHF, NYHA class 3 (HCC)    a. 2007 EF 40%; b. 10/2008: EF 35% by echo; c. echo 10/16: EF 45-50%, sev inf/post wall HK, conduction abnormality, mild MR, mod dilated LA, PASP 47 mm Hg  . Chronic diarrhea   . Chronic  respiratory failure (HCC)   . COPD (chronic obstructive pulmonary disease) (HCC)    on 2L o2 at home  . Emphysema of lung (HCC)   . History of GI bleed   . Hyperlipidemia   . Hypertension   . Hypothyroid   . Ischemic cardiomyopathy   . LBBB (left bundle branch block)   . MI (myocardial infarction) (HCC) 1995  . Sleep apnea    a. on CPAP    HOSPITAL COURSE:   82 year old male with past medical history of systolic CHF, chronic kidney disease stage III, history of previous MI, ischemic cardiac myopathy, hypertension, hyperlipidemia, COPD who presented to the hospital due to shortness of breath and noted to be in CHF.  1. Acute on chronic combined diastolic/systolic CHF-She was admitted to the hospital started on aggressive treatment with IV diuresis but his renal function started to rise. -Due to his severe cardiomyopathy was transferred to the intensive care unit and started on a milrinone drip along with IV diuretics. After receiving therapy with both milrinone and IV diuresis she is about 8 L negative. His shortness of breath and volume overload has significantly improved. -He was weaned off the milrinone drip and placed on oral diuretics which she is tolerating with a stable renal function. His shortness of breath and respiratory failure is improved and therefore is being discharged home. -She will continue his baseline oxygen at 2 L. He will also continue his maintenance cardiac meds including Coreg, losartan, Lasix and have close follow-up with cardiology as an outpatient.   2.  Acute on chronic respiratory failure with hypoxia-this was secondary to CHF. -Please look at management as mentioned above during the hospital course.  3. COPD-no acute exacerbation on the hospital. -He will continue his Spiriva, Breo-Ellipta, O2 supplementation  4. History of rheumatoid arthritis- he will continue Plaquenil.  5. GERD- he will continue Protonix.  6. Hyperlipidemia- he will continue  simvastatin.  7. Acute renal failure-secondary to CHF and cardiorenal hemodynamics. Renal ultrasound negative for obstruction. Patient was seen by nephrology during the hospitalization. He did not require any dialysis. -With inotropes and IV diuretics his renal function did improve and is currently stable on oral diuretics. -This can be further followed up as an outpatient.  8. Atrial fibrillation with rapid ventricular response-patient developed this during the hospital course was placed on amiodarone drip. He has not been weaned off of that. He is currently being discharged on oral amiodarone.  9. Hypothyroidism-he will resume his Synthroid.  Patient is being discharged home with home health nursing, physical therapy, home health aide, social work. The family was approached about possible hospice services given his end-stage diagnosis of cardiomyopathy, severe COPD but the family does not want to entertain that presently.  DISCHARGE CONDITIONS:   Stable  CONSULTS OBTAINED:  Treatment Team:  Mosetta Pigeon, MD Antonieta Iba, MD  DRUG ALLERGIES:  No Known Allergies  DISCHARGE MEDICATIONS:     Medication List    STOP taking these medications   lisinopril 20 MG tablet Commonly known as:  PRINIVIL,ZESTRIL     TAKE these medications   acetaminophen 500 MG tablet Commonly known as:  TYLENOL Take 500 mg by mouth every 6 (six) hours as needed.   albuterol 108 (90 Base) MCG/ACT inhaler Commonly known as:  PROVENTIL HFA;VENTOLIN HFA Inhale 2 puffs into the lungs every 4 (four) hours as needed for wheezing or shortness of breath.   albuterol (2.5 MG/3ML) 0.083% nebulizer solution Commonly known as:  PROVENTIL Take 3 mLs (2.5 mg total) by nebulization every 4 (four) hours. For wheezing/SOB   alendronate 70 MG tablet Commonly known as:  FOSAMAX Take 70 mg by mouth once a week.   allopurinol 100 MG tablet Commonly known as:  ZYLOPRIM Take 100 mg by mouth daily.    amiodarone 200 MG tablet Commonly known as:  PACERONE Take 1 tablet (200 mg total) by mouth 2 (two) times daily.   carvedilol 6.25 MG tablet Commonly known as:  COREG Take 1 tablet (6.25 mg total) by mouth 2 (two) times daily with a meal. What changed:  medication strength  how much to take   eplerenone 50 MG tablet Commonly known as:  INSPRA Take 50 mg by mouth daily.   folic acid 1 MG tablet Commonly known as:  FOLVITE Take 1 mg by mouth daily.   furosemide 40 MG tablet Commonly known as:  LASIX Take 1 tablet (40 mg total) by mouth 2 (two) times daily. What changed:  medication strength  how much to take  when to take this   hydroxychloroquine 200 MG tablet Commonly known as:  PLAQUENIL Take 400 mg by mouth daily.   leflunomide 20 MG tablet Commonly known as:  ARAVA Take 20 mg by mouth daily.   levothyroxine 75 MCG tablet Commonly known as:  SYNTHROID, LEVOTHROID Take 1 tablet (75 mcg total) by mouth daily before breakfast.   losartan 25 MG tablet Commonly known as:  COZAAR Take 1 tablet (25 mg total) by mouth daily.   montelukast 10 MG tablet Commonly known  as:  SINGULAIR Take 10 mg by mouth daily.   MULTIVITAMIN ADULT PO Take 1 tablet by mouth daily.   nitroGLYCERIN 0.4 MG/SPRAY spray Commonly known as:  NITROLINGUAL Place 1 spray under the tongue every 5 (five) minutes x 3 doses as needed for chest pain.   omeprazole 20 MG capsule Commonly known as:  PRILOSEC Take 1 capsule (20 mg total) by mouth daily.   Potassium Chloride ER 20 MEQ Tbcr Take 20 mEq by mouth daily.   simvastatin 40 MG tablet Commonly known as:  ZOCOR Take 1 tablet (40 mg total) by mouth daily.   tiotropium 18 MCG inhalation capsule Commonly known as:  SPIRIVA Place 18 mcg into inhaler and inhale daily.   umeclidinium-vilanterol 62.5-25 MCG/INH Aepb Commonly known as:  ANORO ELLIPTA Inhale 1 puff into the lungs daily.         DISCHARGE INSTRUCTIONS:   DIET:   Cardiac diet  DISCHARGE CONDITION:  Stable  ACTIVITY:  Activity as tolerated  OXYGEN:  Home Oxygen: Yes.     Oxygen Delivery: 3 liters/min via Patient connected to nasal cannula oxygen  DISCHARGE LOCATION:  Home with Home health nursing, Pt, Aid, Social Work.    If you experience worsening of your admission symptoms, develop shortness of breath, life threatening emergency, suicidal or homicidal thoughts you must seek medical attention immediately by calling 911 or calling your MD immediately  if symptoms less severe.  You Must read complete instructions/literature along with all the possible adverse reactions/side effects for all the Medicines you take and that have been prescribed to you. Take any new Medicines after you have completely understood and accpet all the possible adverse reactions/side effects.   Please note  You were cared for by a hospitalist during your hospital stay. If you have any questions about your discharge medications or the care you received while you were in the hospital after you are discharged, you can call the unit and asked to speak with the hospitalist on call if the hospitalist that took care of you is not available. Once you are discharged, your primary care physician will handle any further medical issues. Please note that NO REFILLS for any discharge medications will be authorized once you are discharged, as it is imperative that you return to your primary care physician (or establish a relationship with a primary care physician if you do not have one) for your aftercare needs so that they can reassess your need for medications and monitor your lab values.     Today   Shortness of breath improved, no chest pain, nausea, vomiting. Family at bedside. Wife upset about patient being spoke to about hospice services.  VITAL SIGNS:  Blood pressure 114/72, pulse 71, temperature 98.2 F (36.8 C), temperature source Oral, resp. rate 19, height 5\' 8"  (1.727  m), weight 75.2 kg (165 lb 11.2 oz), SpO2 100 %.  I/O:   Intake/Output Summary (Last 24 hours) at 08/18/15 1524 Last data filed at 08/18/15 0951  Gross per 24 hour  Intake              480 ml  Output              400 ml  Net               80 ml    PHYSICAL EXAMINATION:   GENERAL:  82 y.o.-year-old patient lying in bed in mild resp. Distress. EYES: Pupils equal, round, reactive to light and accommodation. No scleral icterus.  Extraocular muscles intact.  HEENT: Head atraumatic, normocephalic. Oropharynx and nasopharynx clear.  NECK:  Supple, no jugular venous distention. No thyroid enlargement, no tenderness.  LUNGS: Normal breath sounds bilaterally, no wheezing, rhonchi, minimal bibasilar rales. No use of accessory muscles of respiration.  CARDIOVASCULAR: S1, S2 normal. + II/VI SEM at base, No rubs, or gallops.  ABDOMEN: Soft, nontender, nondistended. Bowel sounds present. No organomegaly or mass.  EXTREMITIES: No cyanosis, clubbing or edema b/l.    NEUROLOGIC: Cranial nerves II through XII are intact. No focal Motor or sensory deficits b/l.  Globally weak. PSYCHIATRIC: The patient is alert and oriented x 3.  SKIN: No obvious rash, lesion, or ulcer.   DATA REVIEW:   CBC  Recent Labs Lab 08/18/15 0550  WBC 8.7  HGB 11.3*  HCT 32.4*  PLT 126*    Chemistries   Recent Labs Lab 08/17/15 0520 08/18/15 0550  NA 134* 133*  K 3.5 3.4*  CL 97* 96*  CO2 27 29  GLUCOSE 109* 93  BUN 43* 42*  CREATININE 1.52* 1.42*  CALCIUM 8.3* 8.2*  MG 1.9  --     Cardiac Enzymes No results for input(s): TROPONINI in the last 168 hours.  Microbiology Results  Results for orders placed or performed during the hospital encounter of 08/06/15  C difficile quick scan w PCR reflex     Status: None   Collection Time: 08/06/15 11:39 AM  Result Value Ref Range Status   C Diff antigen NEGATIVE NEGATIVE Final   C Diff toxin NEGATIVE NEGATIVE Final   C Diff interpretation No C. difficile  detected.  Final  MRSA PCR Screening     Status: None   Collection Time: 08/12/15 12:30 PM  Result Value Ref Range Status   MRSA by PCR NEGATIVE NEGATIVE Final    Comment:        The GeneXpert MRSA Assay (FDA approved for NASAL specimens only), is one component of a comprehensive MRSA colonization surveillance program. It is not intended to diagnose MRSA infection nor to guide or monitor treatment for MRSA infections.     RADIOLOGY:  No results found.    Management plans discussed with the patient, family and they are in agreement.  CODE STATUS:     Code Status Orders        Start     Ordered   08/13/15 1144  Limited resuscitation (code)  Continuous    Question Answer Comment  In the event of cardiac or respiratory ARREST: Initiate Code Blue, Call Rapid Response Yes   In the event of cardiac or respiratory ARREST: Perform CPR No   In the event of cardiac or respiratory ARREST: Perform Intubation/Mechanical Ventilation No   In the event of cardiac or respiratory ARREST: Use NIPPV/BiPAp only if indicated Yes   In the event of cardiac or respiratory ARREST: Administer ACLS medications if indicated Yes   In the event of cardiac or respiratory ARREST: Perform Defibrillation or Cardioversion if indicated No      08/13/15 1143    Code Status History    Date Active Date Inactive Code Status Order ID Comments User Context   08/06/2015  1:56 PM 08/13/2015 11:43 AM Full Code [id number]  Enid Baas, MD ED   04/13/2015  3:19 PM 04/16/2015  6:11 PM DNR [id number]  Adrian Saran, MD Inpatient      TOTAL TIME TAKING CARE OF THIS PATIENT: 40 minutes.    Houston Siren M.D on 08/18/2015 at 3:24 PM  Between  7am to 6pm - Pager - [phone]  After 6pm go to [email or web address] - password [email or web address] [facility]  [facility]  Office  [phone]  CC: Primary care physician; [name], DO

## 2015-08-18 NOTE — Care Management (Signed)
To transport by ems. Medical necessity completed.

## 2015-08-18 NOTE — Progress Notes (Signed)
SUBJECTIVE:  Dyspnea is stable. He is off milrinone drip. Long discussion with wife, patient and hospitalist service this morning Wife is upset, felt pressured into picking hospitalist service Does not want hospice home, In fact once to just have visiting nurses as they did before with PT She feels that she can take care of her husband at home without excessive assistance Not ready for hospice in her home yet Wife unclear why hospice once to meet with daughter this afternoon, "not her decision" Patient reports that he feels fine, does have shortness of breath when supine, feels better with either see Pap on nasal cannula oxygen Still feels very weak  Vitals:   08/17/15 2036 08/18/15 0407 08/18/15 0411 08/18/15 1112  BP: 100/67 120/67  114/72  Pulse: 72 (!) 144 71 71  Resp: 18 18  19   Temp: 97.9 F (36.6 C) 97.5 F (36.4 C)  98.2 F (36.8 C)  TempSrc: Oral Oral  Oral  SpO2: 96% 97%  100%  Weight:  165 lb 11.2 oz (75.2 kg)    Height:        Intake/Output Summary (Last 24 hours) at 08/18/15 1334 Last data filed at 08/18/15 0951  Gross per 24 hour  Intake              480 ml  Output              400 ml  Net               80 ml    LABS: Basic Metabolic Panel:  Recent Labs  10/18/15 0520 08/18/15 0550  NA 134* 133*  K 3.5 3.4*  CL 97* 96*  CO2 27 29  GLUCOSE 109* 93  BUN 43* 42*  CREATININE 1.52* 1.42*  CALCIUM 8.3* 8.2*  MG 1.9  --    Liver Function Tests: No results for input(s): AST, ALT, ALKPHOS, BILITOT, PROT, ALBUMIN in the last 72 hours. No results for input(s): LIPASE, AMYLASE in the last 72 hours. CBC:  Recent Labs  08/18/15 0550  WBC 8.7  HGB 11.3*  HCT 32.4*  MCV 94.0  PLT 126*   Cardiac Enzymes: No results for input(s): CKTOTAL, CKMB, CKMBINDEX, TROPONINI in the last 72 hours. BNP: Invalid input(s): POCBNP D-Dimer: No results for input(s): DDIMER in the last 72 hours. Hemoglobin A1C: No results for input(s): HGBA1C in the last 72  hours. Fasting Lipid Panel: No results for input(s): CHOL, HDL, LDLCALC, TRIG, CHOLHDL, LDLDIRECT in the last 72 hours. Thyroid Function Tests: No results for input(s): TSH, T4TOTAL, T3FREE, THYROIDAB in the last 72 hours.  Invalid input(s): FREET3 Anemia Panel: No results for input(s): VITAMINB12, FOLATE, FERRITIN, TIBC, IRON, RETICCTPCT in the last 72 hours.   PHYSICAL EXAM General:  Ill-appearing but in no acute distress. HEENT:  Normocephalic and atramatic Neck:  Mild JVD.  Lungs: Crackles appreciated on the right, relatively clear on the left Heart: HRRR . Normal S1 and S2 without gallops or murmurs.  Abdomen: Bowel sounds are positive, abdomen soft and non-tender  Msk:  Back normal, normal gait. Normal strength and tone for age. Extremities: No clubbing, cyanosis or edema.   Neuro: Alert and oriented X 3. Psych:  Good affect, responds appropriately  TELEMETRY: Reviewed telemetry pt in  ventricular paced rhythm with intermittent nonsustained ventricular tachycardia  ASSESSMENT AND PLAN:  1. Acute on chronic systolic heart failure with severely reduced LV systolic function.   off milrinone drip with reasonable urine output.  Renal function  improved over yesterday's number, close to baseline Weight  close to his outpatient weight. --Would continue  furosemide at 40 mg by mouth twice daily.  small dose carvedilol and losartan.  Amiodarone to 200 mg BID.   2. Coronary artery disease status post CABG:  LHC: Significant underlying three-vessel coronary artery disease with patent SVG to diagonal and SVG to right PDA. SVG to left circumflex was occluded. Native LAD has 70% proximal heavily calcified stenosis ---Medical therapy recommended Discussed with Dr.  Kirke Corin who performed the procedure   3. COPD:  Continue treatment with inhalers. Severe underlying lung disease at this point contributing to his shortness of breath The patient is on home oxygen and this should be  continued. Nasal CPAP when he sleeps   4. Chronic kidney disease:  Followed by nephrology. Creatinine is close to baseline. Would continue aggressive diuresis with close monitoring of renal function   Dispo: Long discussion with hospitalist service, case manager, patient and his wife They would like to go home when ready with visiting nurses, PT/OT Not ready for hospice assistance   Total encounter time more than 35 minutes  Greater than 50% was spent in counseling and coordination of care with the patient    Julien Nordmann, MD 08/18/2015

## 2015-08-18 NOTE — Care Management (Signed)
Ordered rolling walker from [facility].  

## 2015-08-18 NOTE — Clinical Social Work Note (Signed)
MSW received referral for SNF.  Case discussed with case manager, and plan is to discharge home.  MSW to sign off please re-consult if social work needs arise.  Ervin Knack. [place], MSW [phone]

## 2015-08-18 NOTE — Care Management (Signed)
It is anticipated that patient will be discharged home tomorrow with home health with [facility]. SN, PT, OT, HHA and SW. Attending updated.

## 2015-08-19 ENCOUNTER — Telehealth: Payer: Self-pay

## 2015-08-19 NOTE — Telephone Encounter (Signed)
Called patient to follow up with transitional care management.  Patient remarked of not wanting to discuss his care or make a follow up appointment at this time.  Will try again 08/20/15.  PCP aware.

## 2015-08-20 ENCOUNTER — Telehealth: Payer: Self-pay

## 2015-08-20 NOTE — Telephone Encounter (Signed)
Second attempt to follow up with transitional care management.  Patient states would rather not follow up at this time and will call the office back when ready to schedule an appointment and discuss care.

## 2015-08-21 ENCOUNTER — Encounter: Payer: Self-pay | Admitting: Family

## 2015-08-21 ENCOUNTER — Ambulatory Visit: Payer: Medicare Other | Attending: Family | Admitting: Family

## 2015-08-21 VITALS — BP 91/64 | HR 58 | Resp 20 | Ht 68.0 in | Wt 176.0 lb

## 2015-08-21 DIAGNOSIS — Z79899 Other long term (current) drug therapy: Secondary | ICD-10-CM | POA: Diagnosis not present

## 2015-08-21 DIAGNOSIS — I2510 Atherosclerotic heart disease of native coronary artery without angina pectoris: Secondary | ICD-10-CM | POA: Insufficient documentation

## 2015-08-21 DIAGNOSIS — G4733 Obstructive sleep apnea (adult) (pediatric): Secondary | ICD-10-CM | POA: Insufficient documentation

## 2015-08-21 DIAGNOSIS — Z841 Family history of disorders of kidney and ureter: Secondary | ICD-10-CM | POA: Insufficient documentation

## 2015-08-21 DIAGNOSIS — E039 Hypothyroidism, unspecified: Secondary | ICD-10-CM | POA: Insufficient documentation

## 2015-08-21 DIAGNOSIS — Z8489 Family history of other specified conditions: Secondary | ICD-10-CM | POA: Insufficient documentation

## 2015-08-21 DIAGNOSIS — Z8249 Family history of ischemic heart disease and other diseases of the circulatory system: Secondary | ICD-10-CM | POA: Diagnosis not present

## 2015-08-21 DIAGNOSIS — I509 Heart failure, unspecified: Secondary | ICD-10-CM | POA: Diagnosis not present

## 2015-08-21 DIAGNOSIS — Z791 Long term (current) use of non-steroidal anti-inflammatories (NSAID): Secondary | ICD-10-CM | POA: Insufficient documentation

## 2015-08-21 DIAGNOSIS — J45909 Unspecified asthma, uncomplicated: Secondary | ICD-10-CM | POA: Diagnosis not present

## 2015-08-21 DIAGNOSIS — Z87891 Personal history of nicotine dependence: Secondary | ICD-10-CM | POA: Insufficient documentation

## 2015-08-21 DIAGNOSIS — I10 Essential (primary) hypertension: Secondary | ICD-10-CM

## 2015-08-21 DIAGNOSIS — I5023 Acute on chronic systolic (congestive) heart failure: Secondary | ICD-10-CM | POA: Insufficient documentation

## 2015-08-21 DIAGNOSIS — I110 Hypertensive heart disease with heart failure: Secondary | ICD-10-CM | POA: Insufficient documentation

## 2015-08-21 DIAGNOSIS — Z9889 Other specified postprocedural states: Secondary | ICD-10-CM | POA: Insufficient documentation

## 2015-08-21 DIAGNOSIS — Z823 Family history of stroke: Secondary | ICD-10-CM | POA: Diagnosis not present

## 2015-08-21 DIAGNOSIS — M1990 Unspecified osteoarthritis, unspecified site: Secondary | ICD-10-CM | POA: Insufficient documentation

## 2015-08-21 DIAGNOSIS — J449 Chronic obstructive pulmonary disease, unspecified: Secondary | ICD-10-CM

## 2015-08-21 DIAGNOSIS — E785 Hyperlipidemia, unspecified: Secondary | ICD-10-CM | POA: Diagnosis not present

## 2015-08-21 DIAGNOSIS — I5022 Chronic systolic (congestive) heart failure: Secondary | ICD-10-CM

## 2015-08-21 NOTE — Patient Instructions (Signed)
Continue weighing daily and call for an overnight weight gain of > 2 pounds or a weekly weight gain of >5 pounds. 

## 2015-08-21 NOTE — Progress Notes (Signed)
Subjective:    Patient ID: Tom Nguyen, male    DOB: [birth year], 82 y.o.   MRN: [id number]  Congestive Heart Failure  Presents for initial visit. The disease course has been stable. Associated symptoms include edema and fatigue. Pertinent negatives include no abdominal pain, chest pain, chest pressure, orthopnea, palpitations or shortness of breath. The symptoms have been improving. Past treatments include beta blockers, angiotensin receptor blockers and salt and fluid restriction. The treatment provided moderate relief. Compliance with prior treatments has been good. His past medical history is significant for CAD, chronic lung disease and HTN. He has one 1st degree relative with heart disease.  Hypertension  This is a chronic problem. The current episode started more than 1 year ago. The problem is unchanged. The problem is controlled. Associated symptoms include peripheral edema. Pertinent negatives include no chest pain, headaches, palpitations or shortness of breath. There are no associated agents to hypertension. Risk factors for coronary artery disease include dyslipidemia, family history, male gender and sedentary lifestyle. Past treatments include beta blockers, diuretics, lifestyle changes and angiotensin blockers. The current treatment provides moderate improvement. There are no compliance problems.  Hypertensive end-organ damage includes kidney disease, CAD/MI and heart failure.    Past Medical History:  Diagnosis Date  . Arthritis   . Asthma   . CAD (coronary artery disease)    a. s/p CABG 1995; b. stress test 2006: inf post lat infarction w/ peri-infarct ischemia, AK inferior wall & HK inferolat wall, EF 28%. unchanged from prior study 07/2002.  Marland Kitchen Chronic combined systolic and diastolic CHF, NYHA class 3 (HCC)    a. 2007 EF 40%; b. 10/2008: EF 35% by echo; c. echo 10/16: EF 45-50%, sev inf/post wall HK, conduction abnormality, mild MR, mod dilated LA, PASP 47 mm Hg  . Chronic  diarrhea   . Chronic respiratory failure (HCC)   . COPD (chronic obstructive pulmonary disease) (HCC)    on 2L o2 at home  . Emphysema of lung (HCC)   . History of GI bleed   . Hyperlipidemia   . Hypertension   . Hypothyroid   . Ischemic cardiomyopathy   . LBBB (left bundle branch block)   . MI (myocardial infarction) (HCC) 1995  . Sleep apnea    a. on CPAP    Past Surgical History:  Procedure Laterality Date  . CARDIAC CATHETERIZATION    . CARDIAC CATHETERIZATION N/A 08/11/2015   Procedure: Right/Left Heart Cath and Coronary/Graft Angiography;  Surgeon: Iran Ouch, MD;  Location: [facility] INVASIVE CV [facility];  Service: Cardiovascular;  Laterality: N/A;  . CATARACT EXTRACTION    . CORONARY ARTERY BYPASS GRAFT  1995   5 vessels  . INSERT / REPLACE / REMOVE PACEMAKER     Research officer, political party   . PACEMAKER INSERTION  2002  . REVERSE SHOULDER ARTHROPLASTY Right 2013  . REVERSE TOTAL SHOULDER ARTHROPLASTY Left 2014  . TOTAL KNEE ARTHROPLASTY  2009   both knees    Family History  Problem Relation Age of Onset  . Hyperlipidemia Father   . Stroke Father   . Hypertension Father   . Kidney disease Father   . Kidney failure Mother     Was on dialysis    Social History  Substance Use Topics  . Smoking status: Former Smoker    Quit date: 02/15/1993  . Smokeless tobacco: Never Used     Comment: Quit in 1995  . Alcohol use 0.0 - 0.6 oz/week     Comment: occasional  No Known Allergies  Prior to Admission medications   Medication Sig Start Date End Date Taking? Authorizing Provider  acetaminophen (TYLENOL) 500 MG tablet Take 500 mg by mouth every 6 (six) hours as needed.   Yes Historical Provider, MD  albuterol (PROVENTIL HFA;VENTOLIN HFA) 108 (90 Base) MCG/ACT inhaler Inhale 2 puffs into the lungs every 4 (four) hours as needed for wheezing or shortness of breath. 03/05/15  Yes Erin Fulling, MD  albuterol (PROVENTIL) (2.5 MG/3ML) 0.083% nebulizer solution Take 3 mLs (2.5 mg  total) by nebulization every 4 (four) hours. For wheezing/SOB 03/05/15  Yes Erin Fulling, MD  alendronate (FOSAMAX) 70 MG tablet Take 70 mg by mouth once a week.  04/08/15  Yes Historical Provider, MD  allopurinol (ZYLOPRIM) 100 MG tablet Take 100 mg by mouth daily.  08/25/14  Yes Historical Provider, MD  amiodarone (PACERONE) 200 MG tablet Take 1 tablet (200 mg total) by mouth 2 (two) times daily. 08/18/15  Yes Houston Siren, MD  carvedilol (COREG) 6.25 MG tablet Take 1 tablet (6.25 mg total) by mouth 2 (two) times daily with a meal. 08/18/15  Yes Houston Siren, MD  eplerenone (INSPRA) 50 MG tablet Take 50 mg by mouth daily.  08/12/14  Yes Historical Provider, MD  folic acid (FOLVITE) 1 MG tablet Take 1 mg by mouth daily.  08/25/14  Yes Historical Provider, MD  furosemide (LASIX) 40 MG tablet Take 1 tablet (40 mg total) by mouth 2 (two) times daily. 08/18/15  Yes Houston Siren, MD  hydroxychloroquine (PLAQUENIL) 200 MG tablet Take 400 mg by mouth daily.  09/26/14  Yes Historical Provider, MD  leflunomide (ARAVA) 20 MG tablet Take 20 mg by mouth daily.  08/25/14  Yes Historical Provider, MD  levothyroxine (SYNTHROID, LEVOTHROID) 75 MCG tablet Take 1 tablet (75 mcg total) by mouth daily before breakfast. 06/12/15  Yes [name], DO  losartan (COZAAR) 25 MG tablet Take 1 tablet (25 mg total) by mouth daily. 08/18/15  Yes Houston Siren, MD  montelukast (SINGULAIR) 10 MG tablet Take 10 mg by mouth daily.  01/25/15  Yes Historical Provider, MD  Multiple Vitamins-Minerals (MULTIVITAMIN ADULT PO) Take 1 tablet by mouth daily.    Yes Historical Provider, MD  nitroGLYCERIN (NITROLINGUAL) 0.4 MG/SPRAY spray Place 1 spray under the tongue every 5 (five) minutes x 3 doses as needed for chest pain.   Yes Historical Provider, MD  omeprazole (PRILOSEC) 20 MG capsule Take 1 capsule (20 mg total) by mouth daily. 06/17/15  Yes [name], DO  Potassium Chloride ER 20 MEQ TBCR Take 20 mEq by mouth daily. 06/17/15  Yes [name], DO  simvastatin (ZOCOR) 40 MG tablet Take 1 tablet (40 mg total) by mouth daily. 07/01/15  Yes Jayce G Cook, DO  tiotropium (SPIRIVA) 18 MCG inhalation capsule Place 18 mcg into inhaler and inhale daily.   Yes Historical Provider, MD  Umeclidinium-Vilanterol (ANORO ELLIPTA) 62.5-25 MCG/INH AEPB Inhale 1 puff into the lungs daily. 12/04/14  Yes Merwyn Katos, MD     Review of Systems  Constitutional: Positive for appetite change and fatigue.  HENT: Positive for rhinorrhea. Negative for congestion and sore throat.   Eyes: Negative.   Respiratory: Positive for cough (better). Negative for chest tightness and shortness of breath.   Cardiovascular: Positive for leg swelling ("getting better"). Negative for chest pain and palpitations.  Gastrointestinal: Negative for abdominal distention and abdominal pain.  Endocrine: Negative.   Genitourinary: Negative.  Musculoskeletal: Negative for back pain.  Allergic/Immunologic: Negative.   Neurological: Positive for weakness (in legs) and numbness (in both feet). Negative for dizziness, light-headedness and headaches.  Hematological: Negative for adenopathy. Does not bruise/bleed easily.  Psychiatric/Behavioral: Positive for dysphoric mood ("due to health issues") and sleep disturbance (not sleeping well). The patient is not nervous/anxious.        Objective:   Physical Exam  Constitutional: He is oriented to person, place, and time. He appears well-developed and well-nourished.  HENT:  Head: Normocephalic and atraumatic.  Eyes: Conjunctivae are normal. Pupils are equal, round, and reactive to light.  Neck: Normal range of motion. Neck supple.  Cardiovascular: Regular rhythm.  Bradycardia present.   Pulmonary/Chest: Effort normal. He has no wheezes. He has no rales.  Abdominal: Soft. He exhibits no distension. There is no tenderness.  Musculoskeletal: He exhibits edema (1+ pitting edema in bilateral lower legs). He exhibits no tenderness.   Neurological: He is alert and oriented to person, place, and time.  Skin: Skin is warm and dry.  Psychiatric: He has a normal mood and affect. His behavior is normal. Thought content normal.  Nursing note and vitals reviewed.   BP 91/64   Pulse (!) 58   Resp 20   Ht 5\' 8"  (1.727 m)   Wt 176 lb (79.8 kg)   SpO2 98% Comment: 2L  BMI 26.76 kg/m        Assessment & Plan:  1: Chronic heart failure with reduced ejection fraction- Patient presents with fatigue with minimal exertion (Class III). He was tired just getting up from the wheelchair to the scale to get weighed. He will begin physical therapy this coming week at home. He is already weighing himself daily and says that his weight has been stable. Discussed the importance of calling for an overnight weight gain of >2 pounds or a weekly weight gain of >5 pounds. He does not add salt to his food and uses NoSalt for seasoning. Discussed the importance of closely following a 2000mg  sodium diet and written dietary information was given to him and his family. He does have some swelling in both of his lower legs but he feels like the swelling is better. Could consider changing his losartan to entresto if his blood pressure improves. Unable to titrate up his carvedilol due to bradycardia. 2: HTN- Blood pressure on the low side but he denies any dizziness. Blood pressure being monitored at home as well. 3: COPD- Has oxygen that he wears at 2L upon exertion. Has inhalers and nebulizer that he uses. Follows closely with pulmonology in this regard. 4: Obstructive sleep apnea- He does have CPAP at home but says that he's having a difficult time wearing it. He says that he had to take it off last night because he felt claustrophobic. Encouraged him to resume wearing it as much as possible during sleep.  Medication list that was brought was reviewed with the family.  Return here in 1 month or sooner for any questions/problems before then.

## 2015-08-26 ENCOUNTER — Other Ambulatory Visit: Payer: Self-pay | Admitting: *Deleted

## 2015-08-26 ENCOUNTER — Ambulatory Visit (INDEPENDENT_AMBULATORY_CARE_PROVIDER_SITE_OTHER): Payer: Medicare Other | Admitting: Pulmonary Disease

## 2015-08-26 ENCOUNTER — Telehealth: Payer: Self-pay | Admitting: Cardiovascular Disease

## 2015-08-26 ENCOUNTER — Encounter: Payer: Self-pay | Admitting: Pulmonary Disease

## 2015-08-26 VITALS — BP 102/60 | HR 67 | Ht 68.0 in | Wt 167.0 lb

## 2015-08-26 DIAGNOSIS — R0600 Dyspnea, unspecified: Secondary | ICD-10-CM

## 2015-08-26 DIAGNOSIS — J449 Chronic obstructive pulmonary disease, unspecified: Secondary | ICD-10-CM

## 2015-08-26 DIAGNOSIS — I255 Ischemic cardiomyopathy: Secondary | ICD-10-CM

## 2015-08-26 MED ORDER — MONTELUKAST SODIUM 10 MG PO TABS: 10.0000 mg | ORAL_TABLET | Freq: Every day | ORAL | 4 refills | Status: DC

## 2015-08-26 MED ORDER — CARVEDILOL 6.25 MG PO TABS: 6.2500 mg | ORAL_TABLET | Freq: Two times a day (BID) | ORAL | 0 refills | Status: DC

## 2015-08-26 MED ORDER — UMECLIDINIUM-VILANTEROL 62.5-25 MCG/INH IN AEPB: 1.0000 | INHALATION_SPRAY | Freq: Every day | RESPIRATORY_TRACT | 0 refills | Status: AC

## 2015-08-26 MED ORDER — EPLERENONE 50 MG PO TABS: 50.0000 mg | ORAL_TABLET | Freq: Every day | ORAL | 0 refills | Status: DC

## 2015-08-26 MED ORDER — SIMVASTATIN 40 MG PO TABS: 40.0000 mg | ORAL_TABLET | Freq: Every day | ORAL | 0 refills | Status: DC

## 2015-08-26 MED ORDER — AMIODARONE HCL 200 MG PO TABS: 200.0000 mg | ORAL_TABLET | Freq: Two times a day (BID) | ORAL | 0 refills | Status: DC

## 2015-08-26 MED ORDER — FUROSEMIDE 40 MG PO TABS: 40.0000 mg | ORAL_TABLET | Freq: Two times a day (BID) | ORAL | 0 refills | Status: DC

## 2015-08-26 MED ORDER — LOSARTAN POTASSIUM 25 MG PO TABS: 25.0000 mg | ORAL_TABLET | Freq: Every day | ORAL | 0 refills | Status: DC

## 2015-08-26 MED ORDER — POTASSIUM CHLORIDE ER 20 MEQ PO TBCR: 20.0000 meq | EXTENDED_RELEASE_TABLET | Freq: Every day | ORAL | 0 refills | Status: DC

## 2015-08-26 MED ORDER — UMECLIDINIUM-VILANTEROL 62.5-25 MCG/INH IN AEPB: 1.0000 | INHALATION_SPRAY | Freq: Every day | RESPIRATORY_TRACT | 3 refills | Status: AC

## 2015-08-26 NOTE — Progress Notes (Signed)
PULMONARY OFFICE FOLLOW UP  PROBLEMS: COPD Multifactorial dyspnea Chronic AF Ischemic cardiomyopathy - LVEF 25-30% (08/06/15)  INTERVAL: Last seen by me 03/2015. Seen by DK and DR in the office since then Hospitalized 07/20 - 08/18/15 with weakness and decompensated CHF, cardiorenal syndrome (Note: was DNR during that hospitalization)  SUBJ: Recent hospitalization reviewed. Remains very weak and very limited since discharge. Receiving PT in the home. Denies CP, fever, purulent sputum, hemoptysis, LE edema and calf tenderness. Remains on Anoro inhaler. Also on montelukast for allergy symptoms  OBJ: Vitals:   08/26/15 0944  BP: 102/60  Pulse: 67  SpO2: 95%  Weight: 167 lb (75.8 kg)  Height: 5\' 8"  (1.727 m)    Gen: Very frail. Dyspneic with speech HEENT: WNL Neck: Supple, No LAN Lungs: few basilar crackles, no wheezes Cardiovascular: Mostly reg with extrasystoles, soft systolic M Abdomen: Soft, NT +BS Ext: 1+ symmetric ankle edema Neuro: CNs intact, motor/sens grossly intact Skin: No lesions noted   DATA: CXR 08/12/15: CM, NAD   IMPRESSION: Moderate to severe COPD. This appears to be mostly fixed obstruction with little reversibility and little day to day variation.  Severe cardiomyopathy - this is the major limiting factor and cause of his profound weakness and exertional limitation   PLAN: Continue Anoro  Discussed adaptation strategies to compensate for his extreme limitations and symptoms related to CHF  DNR status is appropriate. He should have out of hospital DNR form executed  This was not discussed this visit  Follow up in 4 months or sooner as needed  08/14/15, MD PCCM service Mobile [phone] Pager [phone] 08/26/2015

## 2015-08-26 NOTE — Telephone Encounter (Signed)
Requested Prescriptions   Signed Prescriptions Disp Refills  . amiodarone (PACERONE) 200 MG tablet 180 tablet 0    Sig: Take 1 tablet (200 mg total) by mouth 2 (two) times daily.    Authorizing Provider: Antonieta Iba    Ordering User: Shawnie Dapper, Daichi Moris C  . carvedilol (COREG) 6.25 MG tablet 180 tablet 0    Sig: Take 1 tablet (6.25 mg total) by mouth 2 (two) times daily with a meal.    Authorizing Provider: Antonieta Iba    Ordering User: Kharlie Bring C  . eplerenone (INSPRA) 50 MG tablet 90 tablet 0    Sig: Take 1 tablet (50 mg total) by mouth daily.    Authorizing Provider: Antonieta Iba    Ordering User: Iverson Alamin C  . furosemide (LASIX) 40 MG tablet 180 tablet 0    Sig: Take 1 tablet (40 mg total) by mouth 2 (two) times daily.    Authorizing Provider: Antonieta Iba    Ordering User: Kendrick Fries  . losartan (COZAAR) 25 MG tablet 90 tablet 0    Sig: Take 1 tablet (25 mg total) by mouth daily.    Authorizing Provider: Antonieta Iba    Ordering User: Kendrick Fries Potassium Chloride ER 20 MEQ TBCR 90 tablet 0    Sig: Take 20 mEq by mouth daily.    Authorizing Provider: Antonieta Iba    Ordering User: Kendrick Fries simvastatin (ZOCOR) 40 MG tablet 90 tablet 0    Sig: Take 1 tablet (40 mg total) by mouth daily.    Authorizing Provider: Antonieta Iba    Ordering User: Kendrick Fries

## 2015-08-26 NOTE — Telephone Encounter (Signed)
*  STAT* If patient is at the pharmacy, call can be transferred to refill team.   1. Which medications need to be refilled? (please list name of each medication and dose if known)  amiodarone (PACERONE) 200 MG tablet, carvedilol (COREG) 6.25 MG tablet, eplerenone (INSPRA) 50 MG tablet, furosemide (LASIX) 40 MG tablet, losartan (COZAAR) 25 MG tablet, Potassium Chloride ER 20 MEQ TBCR, simvastatin (ZOCOR) 40 MG tablet.  2. Which pharmacy/location (including street and city if local pharmacy) is medication to be sent to? [facility]  3. Do they need a 30 day or 90 day supply? 90 day

## 2015-08-26 NOTE — Telephone Encounter (Signed)
Requested Prescriptions   Signed Prescriptions Disp Refills  . amiodarone (PACERONE) 200 MG tablet 180 tablet 0    Sig: Take 1 tablet (200 mg total) by mouth 2 (two) times daily.    Authorizing Provider: GOLLAN, TIMOTHY J    Ordering User: Hakop Humbarger C  . carvedilol (COREG) 6.25 MG tablet 180 tablet 0    Sig: Take 1 tablet (6.25 mg total) by mouth 2 (two) times daily with a meal.    Authorizing Provider: GOLLAN, TIMOTHY J    Ordering User: Breonna Gafford C  . eplerenone (INSPRA) 50 MG tablet 90 tablet 0    Sig: Take 1 tablet (50 mg total) by mouth daily.    Authorizing Provider: GOLLAN, TIMOTHY J    Ordering User: Somaly Marteney C  . furosemide (LASIX) 40 MG tablet 180 tablet 0    Sig: Take 1 tablet (40 mg total) by mouth 2 (two) times daily.    Authorizing Provider: GOLLAN, TIMOTHY J    Ordering User: Dontez Hauss C  . losartan (COZAAR) 25 MG tablet 90 tablet 0    Sig: Take 1 tablet (25 mg total) by mouth daily.    Authorizing Provider: GOLLAN, TIMOTHY J    Ordering User: Ronold Hardgrove C  . Potassium Chloride ER 20 MEQ TBCR 90 tablet 0    Sig: Take 20 mEq by mouth daily.    Authorizing Provider: GOLLAN, TIMOTHY J    Ordering User: Vitaliy Eisenhour C  . simvastatin (ZOCOR) 40 MG tablet 90 tablet 0    Sig: Take 1 tablet (40 mg total) by mouth daily.    Authorizing Provider: GOLLAN, TIMOTHY J    Ordering User: Yehudit Fulginiti C    

## 2015-08-29 ENCOUNTER — Other Ambulatory Visit: Payer: Self-pay | Admitting: Family Medicine

## 2015-09-01 ENCOUNTER — Encounter: Payer: Self-pay | Admitting: Internal Medicine

## 2015-09-01 ENCOUNTER — Ambulatory Visit (INDEPENDENT_AMBULATORY_CARE_PROVIDER_SITE_OTHER): Payer: Medicare Other | Admitting: Internal Medicine

## 2015-09-01 VITALS — BP 90/58 | HR 64 | Ht 68.0 in | Wt 168.8 lb

## 2015-09-01 DIAGNOSIS — I255 Ischemic cardiomyopathy: Secondary | ICD-10-CM | POA: Diagnosis not present

## 2015-09-01 DIAGNOSIS — Z95810 Presence of automatic (implantable) cardiac defibrillator: Secondary | ICD-10-CM | POA: Diagnosis not present

## 2015-09-01 DIAGNOSIS — I5022 Chronic systolic (congestive) heart failure: Secondary | ICD-10-CM

## 2015-09-01 LAB — CUP PACEART INCLINIC DEVICE CHECK
HighPow Impedance: 48 Ohm
Implantable Lead Implant Date: 20110519
Implantable Lead Implant Date: 20110519
Implantable Lead Implant Date: 20110519
Implantable Lead Location: 753858
Implantable Lead Location: 753860
Implantable Lead Model: 4087
Implantable Lead Model: 4525
Implantable Lead Serial Number: 109634
Implantable Lead Serial Number: 353235
Lead Channel Impedance Value: 358 Ohm
Lead Channel Impedance Value: 465 Ohm
Lead Channel Pacing Threshold Amplitude: 0.6 V
Lead Channel Pacing Threshold Amplitude: 2.1 V
Lead Channel Pacing Threshold Pulse Width: 0.5 ms
Lead Channel Pacing Threshold Pulse Width: 1 ms
Lead Channel Setting Pacing Amplitude: 2.5 V
Lead Channel Setting Sensing Sensitivity: 0.6 mV
Lead Channel Setting Sensing Sensitivity: 1 mV
MDC IDC LEAD LOCATION: 753859
MDC IDC LEAD SERIAL: 160386
MDC IDC MSMT LEADCHNL RA PACING THRESHOLD AMPLITUDE: 1 V
MDC IDC MSMT LEADCHNL RA PACING THRESHOLD PULSEWIDTH: 0.5 ms
MDC IDC MSMT LEADCHNL RA SENSING INTR AMPL: 5 mV
MDC IDC MSMT LEADCHNL RV IMPEDANCE VALUE: 395 Ohm
MDC IDC PG SERIAL: 588213
MDC IDC SESS DTM: 20170815040000
MDC IDC SET LEADCHNL LV PACING PULSEWIDTH: 1 ms
MDC IDC SET LEADCHNL RA PACING AMPLITUDE: 2 V
MDC IDC SET LEADCHNL RV PACING AMPLITUDE: 2 V
MDC IDC SET LEADCHNL RV PACING PULSEWIDTH: 0.5 ms

## 2015-09-01 MED ORDER — EPLERENONE 25 MG PO TABS
25.0000 mg | ORAL_TABLET | Freq: Every day | ORAL | 0 refills | Status: DC
Start: 2015-09-01 — End: 2015-09-01

## 2015-09-01 MED ORDER — EPLERENONE 25 MG PO TABS: 25.0000 mg | ORAL_TABLET | Freq: Every day | ORAL | 3 refills | Status: DC

## 2015-09-01 MED ORDER — AMIODARONE HCL 200 MG PO TABS: 200.0000 mg | ORAL_TABLET | Freq: Every day | ORAL | Status: DC

## 2015-09-01 MED ORDER — CARVEDILOL 3.125 MG PO TABS: 3.1250 mg | ORAL_TABLET | Freq: Two times a day (BID) | ORAL | 3 refills | Status: DC

## 2015-09-01 NOTE — Progress Notes (Signed)
Patient Care Team: Tom Sams, DO as PCP - General (Family Medicine) Tom Freeze, FNP as Nurse Practitioner (Family Medicine) Tom Iba, MD as Consulting Physician (Cardiology) Tom Salvia, MD as Consulting Physician (Cardiology)   HPI  Tom Nguyen is a 82 y.o. male Seen in follow-up for CRT-D.  The patient has a history of ischemic heart disease with prior bypass surgery and prior shock. Not clear whether was appropriate or not. He underwent CRT upgrade 2011 associated with interval functional improvement.  Echocardiogram 2016 EF 45-50%  He was hospitalized 7/17 for heart failure. These records were reviewed. Ejection fraction had gone to 25-30%. He developed some atrial fibrillation and was started on amiodarone. This may or may not have occurred in the context of milrinone support. Diuresis resulted in worsening renal function. He underwent catheterization  reports were reviewed. Medical therapy was recommended  He is somewhat better but still significantly edematous.   Ablation  Past Medical History:  Diagnosis Date  . Arthritis   . Asthma   . CAD (coronary artery disease)    a. s/p CABG 1995; b. stress test 2006: inf post lat infarction w/ peri-infarct ischemia, AK inferior wall & HK inferolat wall, EF 28%. unchanged from prior study 07/2002.  Marland Kitchen Chronic combined systolic and diastolic CHF, NYHA class 3 (HCC)    a. 2007 EF 40%; b. 10/2008: EF 35% by echo; c. echo 10/16: EF 45-50%, sev inf/post wall HK, conduction abnormality, mild MR, mod dilated LA, PASP 47 mm Hg  . Chronic diarrhea   . Chronic respiratory failure (HCC)   . COPD (chronic obstructive pulmonary disease) (HCC)    on 2L o2 at home  . Emphysema of lung (HCC)   . History of GI bleed   . Hyperlipidemia   . Hypertension   . Hypothyroid   . Ischemic cardiomyopathy   . LBBB (left bundle branch block)   . MI (myocardial infarction) (HCC) 1995  . Sleep apnea    a. on CPAP    Past Surgical  History:  Procedure Laterality Date  . CARDIAC CATHETERIZATION    . CARDIAC CATHETERIZATION N/A 08/11/2015   Procedure: Right/Left Heart Cath and Coronary/Graft Angiography;  Surgeon: Iran Ouch, MD;  Location: [facility] INV[facility]IVE CV [facility];  Service: Cardiovascular;  Laterality: N/A;  . CATARACT EXTRACTION    . CORONARY ARTERY BYPASS GRAFT  1995   5 vessels  . INSERT / REPLACE / REMOVE PACEMAKER     Research officer, political party   . PACEMAKER INSERTION  2002  . REVERSE SHOULDER ARTHROPLASTY Right 2013  . REVERSE TOTAL SHOULDER ARTHROPLASTY Left 2014  . TOTAL KNEE ARTHROPLASTY  2009   both knees    Current Outpatient Prescriptions  Medication Sig Dispense Refill  . acetaminophen (TYLENOL) 500 MG tablet Take 500 mg by mouth every 6 (six) hours as needed.    Marland Kitchen albuterol (PROVENTIL HFA;VENTOLIN HFA) 108 (90 Base) MCG/ACT inhaler Inhale 2 puffs into the lungs every 4 (four) hours as needed for wheezing or shortness of breath. 1 Inhaler 6  . albuterol (PROVENTIL) (2.5 MG/3ML) 0.083% nebulizer solution Take 3 mLs (2.5 mg total) by nebulization every 4 (four) hours. For wheezing/SOB 120 mL 12  . alendronate (FOSAMAX) 70 MG tablet Take 70 mg by mouth once a week.     Marland Kitchen allopurinol (ZYLOPRIM) 100 MG tablet Take 100 mg by mouth daily.     Marland Kitchen amiodarone (PACERONE) 200 MG tablet Take 1 tablet (200 mg  total) by mouth 2 (two) times daily. 180 tablet 0  . carvedilol (COREG) 6.25 MG tablet Take 1 tablet (6.25 mg total) by mouth 2 (two) times daily with a meal. 180 tablet 0  . eplerenone (INSPRA) 50 MG tablet Take 1 tablet (50 mg total) by mouth daily. 90 tablet 0  . folic acid (FOLVITE) 1 MG tablet Take 1 mg by mouth daily.     . furosemide (LASIX) 40 MG tablet Take 1 tablet (40 mg total) by mouth 2 (two) times daily. 180 tablet 0  . hydroxychloroquine (PLAQUENIL) 200 MG tablet Take 400 mg by mouth daily.     Marland Kitchen leflunomide (ARAVA) 20 MG tablet Take 20 mg by mouth daily.     Marland Kitchen levothyroxine (SYNTHROID,  LEVOTHROID) 75 MCG tablet Take 1 tablet (75 mcg total) by mouth daily before breakfast. 90 tablet 2  . losartan (COZAAR) 25 MG tablet Take 1 tablet (25 mg total) by mouth daily. 90 tablet 0  . montelukast (SINGULAIR) 10 MG tablet Take 10 mg by mouth daily.     . Multiple Vitamins-Minerals (MULTIVITAMIN ADULT PO) Take 1 tablet by mouth daily.     . nitroGLYCERIN (NITROLINGUAL) 0.4 MG/SPRAY spray Place 1 spray under the tongue every 5 (five) minutes x 3 doses as needed for chest pain.    Marland Kitchen omeprazole (PRILOSEC) 20 MG capsule Take 1 capsule (20 mg total) by mouth daily. 90 capsule 0  . Potassium Chloride ER 20 MEQ TBCR Take 20 mEq by mouth daily. 90 tablet 0  . simvastatin (ZOCOR) 40 MG tablet Take 1 tablet (40 mg total) by mouth daily. 90 tablet 0  . umeclidinium-vilanterol (ANORO ELLIPTA) 62.5-25 MCG/INH AEPB Inhale 1 puff into the lungs daily. 180 each 3   No current facility-administered medications for this visit.     No Known Allergies    Review of Systems negative except from HPI and PMH  Physical Exam BP (!) 90/58 (BP Location: Left Arm, Patient Position: Sitting, Cuff Size: Small)   Pulse 64   Ht 5\' 8"  (1.727 m)   Wt 168 lb 12 oz (76.5 kg)   BMI 25.66 kg/m  Well developed and well nourished in no acute distress HENT normal E scleral and icterus clear Neck Supple JVP 8 carotids brisk and full Clear to ausculation  Regular rate and rhythm, no murmurs gallops or rub Soft with active bowel sounds No clubbing cyanosis 2-3+Edema Alert and oriented, grossly normal motor and sensory function Skin Warm and Dry  AV pacing at 64   Assessment and  Plan  Ischemic cardiomyopathy  Congestive heart failure-chronic-systolic  Renal insufficiency grade 3  Hypokalemia  Atrial fibrillation-paroxysmal  Implantable defibrillator-CRT-[employer]   The patient has ongoing issues with volume overload. We will increase his diuretics gently for 5 days changing his Lasix from 40/40--80  daily. We will check his metabolic profile today to reassess potassium levels which were low at discharge and has not been rechecked. We will also check a TSH given the initiation of amiodarone.  It turns out that his LV lead output was programmed at threshold. This might have contributed to his heart failure. This is been reprogrammed.  Given his hypotension we will decrease his carvedilol and decrease his eplerenone.

## 2015-09-01 NOTE — Patient Instructions (Addendum)
Medication Instructions: - Your physician has recommended you make the following change in your medication:  1) Decrease amiodarone to 200 mg once daily  2) Decrease coreg (carvedilol) to 3.125 mg twice daily     *you may use the 6.25 mg dose- 1/2 tablet twice daily until the new dose arrives*  3) Decrease inspra (eplerenone) to 25 mg once daily     *a prescription was sent to local pharmacy as well as mail order  4) Take lasix (furosemide) 80 mg once every morning x 5 days, then resume 40 mg twice daily  Labwork: - Your physician recommends that you have lab work today: CMET/ TSH/ CBC  Procedures/Testing: - none  Follow-Up: - Your physician recommends that you schedule a follow-up appointment in: 2 weeks with the PA  - Your physician recommends that you schedule a follow-up appointment in: 3 months with Dr. Graciela Husbands.  Any Additional Special Instructions Will Be Listed Below (If Applicable).     If you need a refill on your cardiac medications before your next appointment, please call your pharmacy.

## 2015-09-02 ENCOUNTER — Telehealth: Payer: Self-pay | Admitting: Cardiovascular Disease

## 2015-09-02 LAB — COMPREHENSIVE METABOLIC PANEL
ALK PHOS: 88 IU/L (ref 39–117)
ALT: 44 IU/L (ref 0–44)
AST: 32 IU/L (ref 0–40)
Albumin/Globulin Ratio: 1.9 (ref 1.2–2.2)
Albumin: 3.7 g/dL (ref 3.5–4.7)
BILIRUBIN TOTAL: 0.8 mg/dL (ref 0.0–1.2)
BUN/Creatinine Ratio: 19 (ref 10–24)
BUN: 28 mg/dL — AB (ref 8–27)
CHLORIDE: 95 mmol/L — AB (ref 96–106)
CO2: 25 mmol/L (ref 18–29)
CREATININE: 1.49 mg/dL — AB (ref 0.76–1.27)
Calcium: 8.9 mg/dL (ref 8.6–10.2)
GFR calc Af Amer: 51 mL/min/{1.73_m2} — ABNORMAL LOW (ref >59)
GFR calc non Af Amer: 44 mL/min/{1.73_m2} — ABNORMAL LOW (ref >59)
GLUCOSE: 86 mg/dL (ref 65–99)
Globulin, Total: 2 g/dL (ref 1.5–4.5)
Potassium: 4.6 mmol/L (ref 3.5–5.2)
Sodium: 137 mmol/L (ref 134–144)
Total Protein: 5.7 g/dL — ABNORMAL LOW (ref 6.0–8.5)

## 2015-09-02 LAB — CBC WITH DIFFERENTIAL/PLATELET
BASOS: 0 %
Basophils Absolute: 0 10*3/uL (ref 0.0–0.2)
EOS (ABSOLUTE): 0.1 10*3/uL (ref 0.0–0.4)
EOS: 2 %
HEMATOCRIT: 31 % — AB (ref 37.5–51.0)
HEMOGLOBIN: 10.2 g/dL — AB (ref 12.6–17.7)
Immature Grans (Abs): 0 10*3/uL (ref 0.0–0.1)
Immature Granulocytes: 0 %
LYMPHS ABS: 0.7 10*3/uL (ref 0.7–3.1)
Lymphs: 16 %
MCH: 31.2 pg (ref 26.6–33.0)
MCHC: 32.9 g/dL (ref 31.5–35.7)
MCV: 95 fL (ref 79–97)
MONOCYTES: 16 %
MONOS ABS: 0.8 10*3/uL (ref 0.1–0.9)
NEUTROS ABS: 3.1 10*3/uL (ref 1.4–7.0)
Neutrophils: 66 %
Platelets: 142 10*3/uL — ABNORMAL LOW (ref 150–379)
RBC: 3.27 x10E6/uL — AB (ref 4.14–5.80)
RDW: 17.2 % — AB (ref 12.3–15.4)
WBC: 4.7 10*3/uL (ref 3.4–10.8)

## 2015-09-02 LAB — TSH: TSH: 1.81 u[IU]/mL (ref 0.450–4.500)

## 2015-09-02 MED ORDER — EPLERENONE 25 MG PO TABS: 25.0000 mg | ORAL_TABLET | Freq: Every day | ORAL | 3 refills | Status: DC

## 2015-09-02 MED ORDER — AMIODARONE HCL 200 MG PO TABS: 200.0000 mg | ORAL_TABLET | Freq: Every day | ORAL | 3 refills | Status: DC

## 2015-09-02 MED ORDER — CARVEDILOL 3.125 MG PO TABS: 3.1250 mg | ORAL_TABLET | Freq: Two times a day (BID) | ORAL | 3 refills | Status: DC

## 2015-09-02 NOTE — Telephone Encounter (Signed)
Pharmacy calling asking if we can re-send in prescription on  eplerenone  They were helping patient get it correct and accidentally deleted the order Please advise.

## 2015-09-04 ENCOUNTER — Telehealth: Payer: Self-pay

## 2015-09-04 DIAGNOSIS — I255 Ischemic cardiomyopathy: Secondary | ICD-10-CM

## 2015-09-04 DIAGNOSIS — I5022 Chronic systolic (congestive) heart failure: Secondary | ICD-10-CM

## 2015-09-04 NOTE — Telephone Encounter (Signed)
-----   Message from Duke Salvia, MD sent at 09/03/2015  9:32 AM EDT ----- Please Inform Patient that labs potassium in now norma,  Thyroid is normal, renal function is not but is pretty stable and blood count a little low and will need followup  In about a month  Thanks

## 2015-09-04 NOTE — Telephone Encounter (Signed)
Informed patient of results and verbal understanding expressed.  CMET, TSH and CBC to be drawn 9/15 at the [place] office. Patient agrees with treatment plan.

## 2015-09-15 ENCOUNTER — Ambulatory Visit (INDEPENDENT_AMBULATORY_CARE_PROVIDER_SITE_OTHER): Payer: Medicare Other | Admitting: Cardiovascular Disease

## 2015-09-15 ENCOUNTER — Encounter: Payer: Self-pay | Admitting: Cardiovascular Disease

## 2015-09-15 ENCOUNTER — Other Ambulatory Visit: Payer: Self-pay

## 2015-09-15 VITALS — BP 118/86 | HR 70 | Ht 68.0 in | Wt 168.8 lb

## 2015-09-15 DIAGNOSIS — I429 Cardiomyopathy, unspecified: Secondary | ICD-10-CM | POA: Diagnosis not present

## 2015-09-15 DIAGNOSIS — I1310 Hypertensive heart and chronic kidney disease without heart failure, with stage 1 through stage 4 chronic kidney disease, or unspecified chronic kidney disease: Secondary | ICD-10-CM

## 2015-09-15 DIAGNOSIS — I10 Essential (primary) hypertension: Secondary | ICD-10-CM

## 2015-09-15 DIAGNOSIS — I5022 Chronic systolic (congestive) heart failure: Secondary | ICD-10-CM

## 2015-09-15 DIAGNOSIS — I25810 Atherosclerosis of coronary artery bypass graft(s) without angina pectoris: Secondary | ICD-10-CM

## 2015-09-15 DIAGNOSIS — I272 Other secondary pulmonary hypertension: Secondary | ICD-10-CM | POA: Diagnosis not present

## 2015-09-15 DIAGNOSIS — N19 Unspecified kidney failure: Secondary | ICD-10-CM

## 2015-09-15 DIAGNOSIS — E785 Hyperlipidemia, unspecified: Secondary | ICD-10-CM

## 2015-09-15 DIAGNOSIS — J432 Centrilobular emphysema: Secondary | ICD-10-CM

## 2015-09-15 MED ORDER — SACUBITRIL-VALSARTAN 24-26 MG PO TABS: 1.0000 | ORAL_TABLET | Freq: Two times a day (BID) | ORAL | 6 refills | Status: DC

## 2015-09-15 NOTE — Progress Notes (Signed)
Cardiology Office Note  Date:  09/15/2015   ID:  Tom Nguyen, DOB 1932-05-10, MRN [id number]  PCP:  Tom Sams, DO   Chief Complaint  Patient presents with  . Other    2 week follow up. Meds reviewed by the patient verbally. "doing well."     HPI:  [name] is a 82 -year-old gentleman with long history of smoking starting at age 47, stopped in 1995, coronary artery disease, bypass surgery in 1995, ischemic cardiomyopathy, sleep apnea with CPAP, COPD, ICD 3 leads, underlying left bundle branch block with ejection fraction 28% who presents for routine follow-up of his coronary artery disease  Recent hospital admission with discharge 08/18/2015 Admitted with acute respiratory failure with hypoxia secondary to COPD and acute on chronic systolic CHF -He was treated with milrinone infusion and IV diuretics, 8 L negative Symptoms dramatically improved --Noted to have atrial fibrillation with RVR during his hospital course, started on amiodarone infusion, discharged on oral amiodarone  Recent cardiac catheterization July 20 50,017 1. Right heart catheterization showed moderate to severe pulmonary hypertension with PA pressure of 55 reports 31 mmHg with pulmonary wedge pressure of 32 mmHg and severely reduced cardiac output at 2.95 with a cardiac index of 1.59. 2. Significant underlying three-vessel coronary artery disease with patent SVG to diagonal and SVG to right PDA. SVG to left circumflex is occluded. Native LAD has 70% proximal heavily calcified stenosis. --- Noted to have episodes of tachycardia during cardiac catheterization Medical management was recommended   In follow-up today, he reports that he is slowly improving  Weight 167 dressed  Has visiting nurse 3x per week Patient does not know pills, other families not at the visit today due to his medications Following low sodium diet Weights daily,  Able to get shoes on today without a shoe horn   slowly getting stronger,   family reports episodes of incontinence of bowels Appears to be taking Lasix 40 mg twice a day , low-dose Coreg, losartan, Inspra   Other past medical history reviewed  Previous episodes of bronchitis, COPD exacerbation. Treated with antibiotics and steroids  Previously was taking  iron daily, past hematocrit 31 Denies having significant GI bleed but stool is dark on the iron  He has a Research officer, political party. Notes provided shows ejection fraction by echocardiogram October 2010 was 35%. Ejection fraction in 2007 was 40% Stress test July 2006 showed inferior posterior lateral infarction with peri-infarct ischemia, ejection fraction 28%. Akinesis of the inferior myocardial segment and hypokinesis of the inferolateral segment. Prior study July 2004, no change  Notes indicate creatinine 1.65 in November 2015 with GFR 39 BNP 07/28/2014 was 200 EKG on today's visit shows a sense, V pace at 70 bpm  He does report that he was previously on warfarin. He does not take aspirin Family is unaware why he was on the warfarin in the past. They deny any atrial fibrillation or other arrhythmia. He had significant bruising, bleeding. Warfarin was held He reports he has not been on aspirin for a long time but does not know why. Possibly from bleeding. Notes also indicate GI bleeding dates back to 2011. Perhaps this is why his anticoagulation was held   PMH:   has a past medical history of Arthritis; Asthma; CAD (coronary artery disease); Chronic combined systolic and diastolic CHF, NYHA class 3 (HCC); Chronic diarrhea; Chronic respiratory failure (HCC); COPD (chronic obstructive pulmonary disease) (HCC); Emphysema of lung (HCC); History of GI bleed; Hyperlipidemia; Hypertension; Hypothyroid; Ischemic cardiomyopathy;  LBBB (left bundle branch block); MI (myocardial infarction) (HCC) (1995); and Sleep apnea.  PSH:    Past Surgical History:  Procedure Laterality Date  . CARDIAC CATHETERIZATION    .  CARDIAC CATHETERIZATION N/A 08/11/2015   Procedure: Right/Left Heart Cath and Coronary/Graft Angiography;  Surgeon: Iran Ouch, MD;  Location: [facility] INVASIVE CV LAB;  Service: Cardiovascular;  Laterality: N/A;  . CATARACT EXTRACTION    . CORONARY ARTERY BYPASS GRAFT  1995   5 vessels  . INSERT / REPLACE / REMOVE PACEMAKER     Research officer, political party   . PACEMAKER INSERTION  2002  . REVERSE SHOULDER ARTHROPLASTY Right 2013  . REVERSE TOTAL SHOULDER ARTHROPLASTY Left 2014  . TOTAL KNEE ARTHROPLASTY  2009   both knees    Current Outpatient Prescriptions  Medication Sig Dispense Refill  . acetaminophen (TYLENOL) 500 MG tablet Take 500 mg by mouth every 6 (six) hours as needed.    Marland Kitchen albuterol (PROVENTIL HFA;VENTOLIN HFA) 108 (90 Base) MCG/ACT inhaler Inhale 2 puffs into the lungs every 4 (four) hours as needed for wheezing or shortness of breath. 1 Inhaler 6  . albuterol (PROVENTIL) (2.5 MG/3ML) 0.083% nebulizer solution Take 3 mLs (2.5 mg total) by nebulization every 4 (four) hours. For wheezing/SOB 120 mL 12  . alendronate (FOSAMAX) 70 MG tablet Take 70 mg by mouth once a week.     Marland Kitchen allopurinol (ZYLOPRIM) 100 MG tablet Take 100 mg by mouth daily.     Marland Kitchen amiodarone (PACERONE) 200 MG tablet Take 1 tablet (200 mg total) by mouth daily. 90 tablet 3  . carvedilol (COREG) 3.125 MG tablet Take 1 tablet (3.125 mg total) by mouth 2 (two) times daily with a meal. 180 tablet 3  . folic acid (FOLVITE) 1 MG tablet Take 1 mg by mouth daily.     . furosemide (LASIX) 40 MG tablet Take 1 tablet (40 mg total) by mouth 2 (two) times daily. 180 tablet 0  . hydroxychloroquine (PLAQUENIL) 200 MG tablet Take 400 mg by mouth daily.     Marland Kitchen leflunomide (ARAVA) 20 MG tablet Take 20 mg by mouth daily.     Marland Kitchen levothyroxine (SYNTHROID, LEVOTHROID) 75 MCG tablet Take 1 tablet (75 mcg total) by mouth daily before breakfast. 90 tablet 2  . montelukast (SINGULAIR) 10 MG tablet Take 10 mg by mouth daily.     . Multiple  Vitamins-Minerals (MULTIVITAMIN ADULT PO) Take 1 tablet by mouth daily.     . nitroGLYCERIN (NITROLINGUAL) 0.4 MG/SPRAY spray Place 1 spray under the tongue every 5 (five) minutes x 3 doses as needed for chest pain.    Marland Kitchen omeprazole (PRILOSEC) 20 MG capsule Take 1 capsule (20 mg total) by mouth daily. 90 capsule 0  . Potassium Chloride ER 20 MEQ TBCR Take 20 mEq by mouth daily. 90 tablet 0  . simvastatin (ZOCOR) 40 MG tablet Take 1 tablet (40 mg total) by mouth daily. 90 tablet 0  . umeclidinium-vilanterol (ANORO ELLIPTA) 62.5-25 MCG/INH AEPB Inhale 1 puff into the lungs daily. 180 each 3  . sacubitril-valsartan (ENTRESTO) 24-26 MG Take 1 tablet by mouth 2 (two) times daily. 60 tablet 6   No current facility-administered medications for this visit.      Allergies:   Review of patient's allergies indicates no known allergies.   Social History:  The patient  reports that he quit smoking about 22 years ago. He has never used smokeless tobacco. He reports that he drinks alcohol. He reports that he  does not use drugs.   Family History:   family history includes Hyperlipidemia in his father; Hypertension in his father; Kidney disease in his father; Kidney failure in his mother; Stroke in his father.    Review of Systems: Review of Systems  Constitutional: Negative.   Respiratory: Positive for shortness of breath.   Cardiovascular: Positive for leg swelling.  Gastrointestinal: Negative.   Musculoskeletal: Negative.   Neurological: Negative.   Psychiatric/Behavioral: Negative.   All other systems reviewed and are negative.    PHYSICAL EXAM: VS:  BP 118/86 (BP Location: Left Arm, Patient Position: Sitting, Cuff Size: Normal)   Pulse 70   Ht 5\' 8"  (1.727 m)   Wt 168 lb 12 oz (76.5 kg)   BMI 25.66 kg/m  , BMI Body mass index is 25.66 kg/m. GEN: Well nourished, well developed, in no acute distress , Presents in a wheelchair now HEENT: normal  Neck: no JVD, carotid bruits, or  masses Cardiac: RRR; no murmurs, rubs, or gallops, 1+ pitting edema bilateral lower extremities to the mid shins   Respiratory:  Decreased breath sounds throughout, particularly bilateral bases, normal work of breathing GI: soft, nontender, nondistended, + BS MS: no deformity or atrophy  Skin: warm and dry, no rash Neuro:  Strength and sensation are intact Psych: euthymic mood, full affect    Recent Labs: 08/16/2015: B Natriuretic Peptide 2,409.0 08/17/2015: Magnesium 1.9 08/18/2015: Hemoglobin 11.3 09/01/2015: ALT 44; BUN 28; Creatinine, Ser 1.49; Platelets 142; Potassium 4.6; Sodium 137; TSH 1.810    Lipid Panel Lab Results  Component Value Date   CHOL 91 10/20/2014   HDL 40.00 10/20/2014   LDLCALC 28 10/20/2014   TRIG 112.0 10/20/2014      Wt Readings from Last 3 Encounters:  09/15/15 168 lb 12 oz (76.5 kg)  09/01/15 168 lb 12 oz (76.5 kg)  08/26/15 167 lb (75.8 kg)       ASSESSMENT AND PLAN:  Cardiomyopathy (HCC) - Plan: Basic Metabolic Panel (BMET) Ischemic cardiomyopathy Recent hospital admission for acute on chronic systolic CHF Medications discussed with him in detail We will hold the losartan and Inspra Recommended he start entresto 24/26 mg twice a day Recheck BMP in one month Further medication titration as blood pressure tolerates We'll try to increase carvedilol uon his next clinic visit or increase entrsto dosing  Chronic systolic heart failure (HCC) - Plan: Basic Metabolic Panel (BMET) Weight is stable, has visiting nurses Periodically taking extra Lasix for weight gain or chest fullness, shortness of breath Still with leg ede ma, pitting Likely 45 pounds above his ideal weight  Atherosclerosis of coronary artery bypass graft of native heart without angina pectoris Recent cardiac catheterization reviewed with him in detail Medical management recommended, severe three-vessel coronary disease  Pulmonary HTN (HCC) Grade elevated pressures on right  heart catheterization Suspected ideal weight should be around 163 pounds, currently 167 pounds  HLD (hyperlipidemia) Cholesterol is at goal on the current lipid regimen. No changes to the medications were made.  Cardiorenal syndrome with renal failure  recheck BMP in one month time   Centrilobular emphysema (HCC) Severe underlying COPD,   prone to recurrent COPD exacerbation and bronchitis   Total encounter time more than 40 minutes  Greater than 50% was spent in counseling and coordination of care with the patient   Disposition:   F/U  1 month   Orders Placed This Encounter  Procedures  . Basic Metabolic Panel (BMET)     Signed, Dossie Arbour, M.D., Ph.D.  09/15/2015  [facility] [place], [place] [phone]

## 2015-09-15 NOTE — Patient Instructions (Addendum)
Medication Instructions:   Please stop the losartan (do not throw away) Stop inspra for now  Start entresto 24/26 mg twice a day Come in for labs in one month  Labwork:  BMP in one month  Testing/Procedures:  No further testing at this time   Follow-Up: It was a pleasure seeing you in the office today. Please call us if you have new issues that need to be addressed before your next appt.  [phone]  Your physician wants you to follow-up in: 1 month.    If you need a refill on your cardiac medications before your next appointment, please call your pharmacy.

## 2015-09-16 ENCOUNTER — Telehealth: Payer: Self-pay

## 2015-09-16 NOTE — Telephone Encounter (Signed)
Prior auth for [employer] 24-26 obtained from [facility]. Case [id number]. Local pharmacy notified.

## 2015-09-17 ENCOUNTER — Ambulatory Visit: Payer: Medicare Other | Admitting: Family Medicine

## 2015-09-22 ENCOUNTER — Encounter: Payer: Self-pay | Admitting: Internal Medicine

## 2015-09-22 ENCOUNTER — Ambulatory Visit: Payer: Medicare Other | Admitting: Physician Assistant

## 2015-09-24 ENCOUNTER — Emergency Department: Payer: Medicare Other

## 2015-09-24 ENCOUNTER — Inpatient Hospital Stay
Admission: EM | Admit: 2015-09-24 | Discharge: 2015-09-30 | DRG: 308 | Disposition: A | Discharge status: Other Acute Inpatient Hospital | Payer: Medicare Other | Attending: Internal Medicine | Admitting: Internal Medicine

## 2015-09-24 DIAGNOSIS — N179 Acute kidney failure, unspecified: Secondary | ICD-10-CM | POA: Diagnosis not present

## 2015-09-24 DIAGNOSIS — I472 Ventricular tachycardia, unspecified: Secondary | ICD-10-CM

## 2015-09-24 DIAGNOSIS — Z7902 Long term (current) use of antithrombotics/antiplatelets: Secondary | ICD-10-CM

## 2015-09-24 DIAGNOSIS — E871 Hypo-osmolality and hyponatremia: Secondary | ICD-10-CM | POA: Diagnosis present

## 2015-09-24 DIAGNOSIS — E785 Hyperlipidemia, unspecified: Secondary | ICD-10-CM | POA: Diagnosis present

## 2015-09-24 DIAGNOSIS — J449 Chronic obstructive pulmonary disease, unspecified: Secondary | ICD-10-CM | POA: Diagnosis present

## 2015-09-24 DIAGNOSIS — K921 Melena: Secondary | ICD-10-CM | POA: Diagnosis present

## 2015-09-24 DIAGNOSIS — I071 Rheumatic tricuspid insufficiency: Secondary | ICD-10-CM | POA: Diagnosis present

## 2015-09-24 DIAGNOSIS — I959 Hypotension, unspecified: Secondary | ICD-10-CM | POA: Diagnosis present

## 2015-09-24 DIAGNOSIS — I255 Ischemic cardiomyopathy: Secondary | ICD-10-CM | POA: Diagnosis not present

## 2015-09-24 DIAGNOSIS — I5023 Acute on chronic systolic (congestive) heart failure: Secondary | ICD-10-CM | POA: Diagnosis not present

## 2015-09-24 DIAGNOSIS — N170 Acute kidney failure with tubular necrosis: Secondary | ICD-10-CM | POA: Diagnosis present

## 2015-09-24 DIAGNOSIS — D631 Anemia in chronic kidney disease: Secondary | ICD-10-CM | POA: Diagnosis present

## 2015-09-24 DIAGNOSIS — M069 Rheumatoid arthritis, unspecified: Secondary | ICD-10-CM | POA: Diagnosis present

## 2015-09-24 DIAGNOSIS — K649 Unspecified hemorrhoids: Secondary | ICD-10-CM | POA: Diagnosis present

## 2015-09-24 DIAGNOSIS — I272 Other secondary pulmonary hypertension: Secondary | ICD-10-CM | POA: Diagnosis present

## 2015-09-24 DIAGNOSIS — I130 Hypertensive heart and chronic kidney disease with heart failure and stage 1 through stage 4 chronic kidney disease, or unspecified chronic kidney disease: Secondary | ICD-10-CM | POA: Diagnosis present

## 2015-09-24 DIAGNOSIS — Z9981 Dependence on supplemental oxygen: Secondary | ICD-10-CM

## 2015-09-24 DIAGNOSIS — Z87442 Personal history of urinary calculi: Secondary | ICD-10-CM

## 2015-09-24 DIAGNOSIS — Z96611 Presence of right artificial shoulder joint: Secondary | ICD-10-CM | POA: Diagnosis present

## 2015-09-24 DIAGNOSIS — R34 Anuria and oliguria: Secondary | ICD-10-CM | POA: Diagnosis not present

## 2015-09-24 DIAGNOSIS — I2510 Atherosclerotic heart disease of native coronary artery without angina pectoris: Secondary | ICD-10-CM | POA: Diagnosis present

## 2015-09-24 DIAGNOSIS — I480 Paroxysmal atrial fibrillation: Secondary | ICD-10-CM | POA: Diagnosis present

## 2015-09-24 DIAGNOSIS — I248 Other forms of acute ischemic heart disease: Secondary | ICD-10-CM | POA: Diagnosis present

## 2015-09-24 DIAGNOSIS — Z96653 Presence of artificial knee joint, bilateral: Secondary | ICD-10-CM | POA: Diagnosis present

## 2015-09-24 DIAGNOSIS — I252 Old myocardial infarction: Secondary | ICD-10-CM | POA: Diagnosis not present

## 2015-09-24 DIAGNOSIS — I5043 Acute on chronic combined systolic (congestive) and diastolic (congestive) heart failure: Secondary | ICD-10-CM | POA: Diagnosis present

## 2015-09-24 DIAGNOSIS — J432 Centrilobular emphysema: Secondary | ICD-10-CM | POA: Diagnosis present

## 2015-09-24 DIAGNOSIS — R570 Cardiogenic shock: Secondary | ICD-10-CM | POA: Diagnosis not present

## 2015-09-24 DIAGNOSIS — I447 Left bundle-branch block, unspecified: Secondary | ICD-10-CM | POA: Diagnosis present

## 2015-09-24 DIAGNOSIS — Z87891 Personal history of nicotine dependence: Secondary | ICD-10-CM

## 2015-09-24 DIAGNOSIS — I25810 Atherosclerosis of coronary artery bypass graft(s) without angina pectoris: Secondary | ICD-10-CM | POA: Diagnosis present

## 2015-09-24 DIAGNOSIS — G4733 Obstructive sleep apnea (adult) (pediatric): Secondary | ICD-10-CM | POA: Diagnosis present

## 2015-09-24 DIAGNOSIS — Z7983 Long term (current) use of bisphosphonates: Secondary | ICD-10-CM

## 2015-09-24 DIAGNOSIS — Z8249 Family history of ischemic heart disease and other diseases of the circulatory system: Secondary | ICD-10-CM

## 2015-09-24 DIAGNOSIS — I4901 Ventricular fibrillation: Secondary | ICD-10-CM | POA: Diagnosis present

## 2015-09-24 DIAGNOSIS — R55 Syncope and collapse: Secondary | ICD-10-CM

## 2015-09-24 DIAGNOSIS — Z79899 Other long term (current) drug therapy: Secondary | ICD-10-CM

## 2015-09-24 DIAGNOSIS — E039 Hypothyroidism, unspecified: Secondary | ICD-10-CM | POA: Diagnosis present

## 2015-09-24 DIAGNOSIS — Z95810 Presence of automatic (implantable) cardiac defibrillator: Secondary | ICD-10-CM

## 2015-09-24 DIAGNOSIS — Z96612 Presence of left artificial shoulder joint: Secondary | ICD-10-CM | POA: Diagnosis present

## 2015-09-24 DIAGNOSIS — J9610 Chronic respiratory failure, unspecified whether with hypoxia or hypercapnia: Secondary | ICD-10-CM | POA: Diagnosis present

## 2015-09-24 DIAGNOSIS — Z23 Encounter for immunization: Secondary | ICD-10-CM | POA: Diagnosis not present

## 2015-09-24 DIAGNOSIS — N171 Acute kidney failure with acute cortical necrosis: Secondary | ICD-10-CM | POA: Diagnosis not present

## 2015-09-24 DIAGNOSIS — Z841 Family history of disorders of kidney and ureter: Secondary | ICD-10-CM

## 2015-09-24 DIAGNOSIS — Z823 Family history of stroke: Secondary | ICD-10-CM

## 2015-09-24 DIAGNOSIS — N183 Chronic kidney disease, stage 3 (moderate): Secondary | ICD-10-CM | POA: Diagnosis present

## 2015-09-24 HISTORY — DX: Ventricular tachycardia: I47.2

## 2015-09-24 HISTORY — DX: Ventricular tachycardia, unspecified: I47.20

## 2015-09-24 LAB — APTT: APTT: 35 s (ref 24–36)

## 2015-09-24 LAB — COMPREHENSIVE METABOLIC PANEL
ALBUMIN: 3.6 g/dL (ref 3.5–5.0)
ALT: 26 U/L (ref 17–63)
AST: 42 U/L — AB (ref 15–41)
Alkaline Phosphatase: 97 U/L (ref 38–126)
Anion gap: 14 (ref 5–15)
BILIRUBIN TOTAL: 1.6 mg/dL — AB (ref 0.3–1.2)
BUN: 32 mg/dL — ABNORMAL HIGH (ref 6–20)
CALCIUM: 8.9 mg/dL (ref 8.9–10.3)
CHLORIDE: 101 mmol/L (ref 101–111)
CO2: 21 mmol/L — AB (ref 22–32)
CREATININE: 1.77 mg/dL — AB (ref 0.61–1.24)
GFR calc Af Amer: 40 mL/min — ABNORMAL LOW (ref >60)
GFR calc non Af Amer: 35 mL/min — ABNORMAL LOW (ref >60)
Glucose, Bld: 133 mg/dL — ABNORMAL HIGH (ref 65–99)
POTASSIUM: 4.2 mmol/L (ref 3.5–5.1)
Sodium: 136 mmol/L (ref 135–145)
TOTAL PROTEIN: 6.5 g/dL (ref 6.5–8.1)

## 2015-09-24 LAB — PROTIME-INR
INR: 1.51
PROTHROMBIN TIME: 18.4 s — AB (ref 11.4–15.2)

## 2015-09-24 LAB — GLUCOSE, CAPILLARY: Glucose-Capillary: 142 mg/dL — ABNORMAL HIGH (ref 65–99)

## 2015-09-24 LAB — TROPONIN I: TROPONIN I: 0.05 ng/mL — AB (ref <0.03)

## 2015-09-24 LAB — CBC
HCT: 33.2 % — ABNORMAL LOW (ref 40.0–52.0)
HEMOGLOBIN: 11.1 g/dL — AB (ref 13.0–18.0)
MCH: 32.1 pg (ref 26.0–34.0)
MCHC: 33.5 g/dL (ref 32.0–36.0)
MCV: 95.6 fL (ref 80.0–100.0)
PLATELETS: 120 10*3/uL — AB (ref 150–440)
RBC: 3.47 MIL/uL — AB (ref 4.40–5.90)
RDW: 20.1 % — ABNORMAL HIGH (ref 11.5–14.5)
WBC: 7.5 10*3/uL (ref 3.8–10.6)

## 2015-09-24 LAB — MRSA PCR SCREENING: MRSA by PCR: NEGATIVE

## 2015-09-24 MED ORDER — HEPARIN (PORCINE) IN NACL 100-0.45 UNIT/ML-% IJ SOLN
900.0000 [IU]/h | INTRAMUSCULAR | Status: DC
  Administered 2015-09-24: 900 [IU]/h via INTRAVENOUS
  Filled 2015-09-24 (×2): qty 250

## 2015-09-24 MED ORDER — AMIODARONE IV BOLUS ONLY 150 MG/100ML
150.0000 mg | Freq: Once | INTRAVENOUS | Status: AC
  Administered 2015-09-24: 150 mg via INTRAVENOUS

## 2015-09-24 MED ORDER — DEXTROSE 5 % IV SOLN
60.0000 mg/h | Freq: Once | INTRAVENOUS | Status: AC
  Administered 2015-09-24: 60 mg/h via INTRAVENOUS
  Filled 2015-09-24: qty 9

## 2015-09-24 MED ORDER — SODIUM CHLORIDE 0.9% FLUSH: 3.0000 mL | INTRAVENOUS | Status: DC | PRN

## 2015-09-24 MED ORDER — ACETAMINOPHEN 325 MG PO TABS
650.0000 mg | ORAL_TABLET | ORAL | Status: DC | PRN
  Administered 2015-09-30: 650 mg via ORAL
  Filled 2015-09-24: qty 2

## 2015-09-24 MED ORDER — SODIUM CHLORIDE 0.9% FLUSH
3.0000 mL | Freq: Two times a day (BID) | INTRAVENOUS | Status: DC
  Administered 2015-09-25 – 2015-09-30 (×9): 3 mL via INTRAVENOUS

## 2015-09-24 MED ORDER — AMIODARONE HCL IN DEXTROSE 360-4.14 MG/200ML-% IV SOLN
INTRAVENOUS | Status: AC
Start: 2015-09-24 — End: 2015-09-24
  Filled 2015-09-24: qty 200

## 2015-09-24 MED ORDER — HEPARIN SODIUM (PORCINE) 5000 UNIT/ML IJ SOLN: 4000.0000 [IU] | Freq: Once | INTRAMUSCULAR | Status: DC

## 2015-09-24 MED ORDER — MAGNESIUM SULFATE 2 GM/50ML IV SOLN
2.0000 g | Freq: Once | INTRAVENOUS | Status: AC
  Administered 2015-09-24: 2 g via INTRAVENOUS
  Filled 2015-09-24: qty 50

## 2015-09-24 MED ORDER — SODIUM CHLORIDE 0.9 % IV SOLN: 250.0000 mL | INTRAVENOUS | Status: DC | PRN

## 2015-09-24 MED ORDER — FAMOTIDINE IN NACL 20-0.9 MG/50ML-% IV SOLN
20.0000 mg | Freq: Two times a day (BID) | INTRAVENOUS | Status: DC
  Administered 2015-09-24: 20 mg via INTRAVENOUS
  Filled 2015-09-24: qty 50

## 2015-09-24 MED ORDER — HEPARIN BOLUS VIA INFUSION
4000.0000 [IU] | Freq: Once | INTRAVENOUS | Status: AC
  Administered 2015-09-24: 4000 [IU] via INTRAVENOUS
  Filled 2015-09-24: qty 4000

## 2015-09-24 MED ORDER — ENOXAPARIN SODIUM 40 MG/0.4ML ~~LOC~~ SOLN: 30.0000 mg | SUBCUTANEOUS | Status: DC

## 2015-09-24 MED ORDER — AMIODARONE HCL IN DEXTROSE 360-4.14 MG/200ML-% IV SOLN
60.0000 mg/h | INTRAVENOUS | Status: AC
Start: 2015-09-25 — End: 2015-09-25
  Administered 2015-09-25: 30 mg/h via INTRAVENOUS
  Filled 2015-09-24: qty 200

## 2015-09-24 MED ORDER — ONDANSETRON HCL 4 MG/2ML IJ SOLN: 4.0000 mg | Freq: Four times a day (QID) | INTRAMUSCULAR | Status: DC | PRN

## 2015-09-24 MED ORDER — DEXTROSE 5 % IV SOLN: 60.0000 mg/h | INTRAVENOUS | Status: DC

## 2015-09-24 MED ORDER — LACTATED RINGERS IV SOLN: INTRAVENOUS | Status: DC

## 2015-09-24 MED ORDER — HEPARIN (PORCINE) IN NACL 100-0.45 UNIT/ML-% IJ SOLN: 12.0000 [IU]/kg/h | Freq: Once | INTRAMUSCULAR | Status: DC

## 2015-09-24 MED ORDER — PNEUMOCOCCAL VAC POLYVALENT 25 MCG/0.5ML IJ INJ
0.5000 mL | INJECTION | INTRAMUSCULAR | Status: AC
  Administered 2015-09-26: 0.5 mL via INTRAMUSCULAR
  Filled 2015-09-24: qty 0.5

## 2015-09-24 MED ORDER — SODIUM CHLORIDE 0.9 % IV BOLUS (SEPSIS)
1000.0000 mL | Freq: Once | INTRAVENOUS | Status: AC
  Administered 2015-09-24: 1000 mL via INTRAVENOUS

## 2015-09-24 NOTE — ED Triage Notes (Addendum)
Pt reports to ED w/ c/o CP and SOB that started today.  In triage pts HR >150.  Pt brought back to room 8, MD Williams at bedside.  Pt rated pain 10/10. Pt alert, able to answer questions

## 2015-09-24 NOTE — ED Notes (Signed)
MD Mayford Knife at bedside. Pt began lose responsiveness, BP continued to drop.  Pt defib once at 150 J.  Shock delivered at pt became more responsive.  BP increased.

## 2015-09-24 NOTE — ED Notes (Signed)
PATIENTS DAUGHTER IN CASE OF UPDATE:  [phone]

## 2015-09-24 NOTE — Progress Notes (Signed)
eLink Physician-Brief Progress Note Patient Name: Tyriq Moragne DOB: 1932-11-02 MRN: [id number]     Recent Labs Lab 09/24/15 1927  TROPONINI 0.05*    Recent Labs Lab 09/24/15 1927  NA 136  K 4.2  CL 101  CO2 21*  GLUCOSE 133*  BUN 32*  CREATININE 1.77*  CALCIUM 8.9     Patient admitted with VTac and s.op cardioversio  0n camera   Sleeping Paced Looks stable  A V Tach  P Advised APP to call cards and ensure mag/phos checked  Dr. Kalman Shan, M.D., [facility].C.P Pulmonary and Critical Care Medicine Staff Physician [facility] Pager: [phone], If no answer or between  15:00h - 7:00h: call [phone]  09/24/2015 11:25 PM      Intervention Category Major Interventions: Other:  Nattie Lazenby 09/24/2015, 11:24 PM

## 2015-09-24 NOTE — ED Notes (Signed)
PATIENT'S DAUGHTER PHONE NUMBER CALL FOR UPDATE:  [phone]

## 2015-09-24 NOTE — Progress Notes (Signed)
ANTICOAGULATION CONSULT NOTE - Initial Consult  Pharmacy Consult for Heparin Indication: chest pain/ACS  No Known Allergies  Patient Measurements:   Heparin Dosing Weight: 76.5 kg  Vital Signs: BP: 80/61 (09/07 1924) Pulse Rate: 84 (09/07 1926)  Labs:  Recent Labs  09/24/15 1927  HGB 11.1*  HCT 33.2*  PLT 120*  APTT 35  LABPROT 18.4*  INR 1.51  CREATININE 1.77*  TROPONINI 0.05*    Estimated Creatinine Clearance: 32.2 mL/min (by C-G formula based on SCr of 1.77 mg/dL).   Medical History: Past Medical History:  Diagnosis Date  . Arthritis   . Asthma   . CAD (coronary artery disease)    a. s/p CABG 1995; b. stress test 2006: inf post lat infarction w/ peri-infarct ischemia, AK inferior wall & HK inferolat wall, EF 28%. unchanged from prior study 07/2002.  Marland Kitchen Chronic combined systolic and diastolic CHF, NYHA class 3 (HCC)    a. 2007 EF 40%; b. 10/2008: EF 35% by echo; c. echo 10/16: EF 45-50%, sev inf/post wall HK, conduction abnormality, mild MR, mod dilated LA, PASP 47 mm Hg  . Chronic diarrhea   . Chronic respiratory failure (HCC)   . COPD (chronic obstructive pulmonary disease) (HCC)    on 2L o2 at home  . Emphysema of lung (HCC)   . History of GI bleed   . Hyperlipidemia   . Hypertension   . Hypothyroid   . Ischemic cardiomyopathy   . LBBB (left bundle branch block)   . MI (myocardial infarction) (HCC) 1995  . Sleep apnea    a. on CPAP    Medications:   (Not in a hospital admission) Scheduled:  . heparin  4,000 Units Intravenous Once    Assessment: 82 y/o M with a known h/o CHF and CAD not on anticoagulants per family.   Goal of Therapy:  Heparin level 0.3-0.7 units/ml Monitor platelets by anticoagulation protocol: Yes   Plan:  Give 4000 units bolus x 1 Start heparin infusion at 900 units/hr Check anti-Xa level in 8 hours and daily while on heparin Continue to monitor H&H and platelets  Luisa Hart D 09/24/2015,8:10 PM

## 2015-09-24 NOTE — ED Provider Notes (Addendum)
[facility] Emergency Department Provider Note    L5 caveat: Review of systems and history is limited by respiratory distress    Time seen: ----------------------------------------- 7:23 PM on 09/24/2015 -----------------------------------------    I have reviewed the triage vital signs and the nursing notes.   HISTORY  Chief Complaint No chief complaint on file.    HPI Tom Nguyen is a 82 y.o. male who presents to ER for acute shortness of breath and tachycardia. Patient is not having chest pain but is having difficulty breathing. Patient states his heart has never raced before. He does see a cardiologist Dr. Mariah Milling and saw him about a week ago. He denies recent illness or changes in his medicines. Currently he presents in distress.   Past Medical History:  Diagnosis Date  . Arthritis   . Asthma   . CAD (coronary artery disease)    a. s/p CABG 1995; b. stress test 2006: inf post lat infarction w/ peri-infarct ischemia, AK inferior wall & HK inferolat wall, EF 28%. unchanged from prior study 07/2002.  Marland Kitchen Chronic combined systolic and diastolic CHF, NYHA class 3 (HCC)    a. 2007 EF 40%; b. 10/2008: EF 35% by echo; c. echo 10/16: EF 45-50%, sev inf/post wall HK, conduction abnormality, mild MR, mod dilated LA, PASP 47 mm Hg  . Chronic diarrhea   . Chronic respiratory failure (HCC)   . COPD (chronic obstructive pulmonary disease) (HCC)    on 2L o2 at home  . Emphysema of lung (HCC)   . History of GI bleed   . Hyperlipidemia   . Hypertension   . Hypothyroid   . Ischemic cardiomyopathy   . LBBB (left bundle branch block)   . MI (myocardial infarction) (HCC) 1995  . Sleep apnea    a. on CPAP    Patient Active Problem List   Diagnosis Date Noted  . Chronic systolic heart failure (HCC) 08/21/2015  . Obstructive sleep apnea 08/21/2015  . Weakness generalized   . S/P CABG (coronary artery bypass graft)   . S/P ICD (internal cardiac defibrillator)  procedure   . DNR (do not resuscitate) discussion 08/13/2015  . Palliative care encounter 08/13/2015  . Cardiorenal syndrome with renal failure - improving with IV Lasix 08/08/2015  . Elevated troponin   . Dyspnea 08/06/2015  . Atherosclerosis of coronary artery bypass graft of native heart without angina pectoris   . Pulmonary HTN (HCC)   . Centrilobular emphysema (HCC)   . Anemia 06/17/2015  . Weight loss 04/24/2015  . Preventative health care 10/21/2014  . HLD (hyperlipidemia) 10/21/2014  . HTN (hypertension) 10/21/2014  . CKD (chronic kidney disease) stage 3, GFR 30-59 ml/min 10/21/2014  . GERD (gastroesophageal reflux disease) 10/21/2014  . Osteoporosis 10/21/2014  . CAD (coronary artery disease) 10/21/2014  . COPD (chronic obstructive pulmonary disease) (HCC) 10/21/2014  . Rheumatoid arthritis (HCC) 10/21/2014  . Hypothyroidism 10/21/2014  . Gout 10/21/2014  . Cardiomyopathy, ischemic 10/21/2014    Past Surgical History:  Procedure Laterality Date  . CARDIAC CATHETERIZATION    . CARDIAC CATHETERIZATION N/A 08/11/2015   Procedure: Right/Left Heart Cath and Coronary/Graft Angiography;  Surgeon: Iran Ouch, MD;  Location: [facility] INVASIVE CV LAB;  Service: Cardiovascular;  Laterality: N/A;  . CATARACT EXTRACTION    . CORONARY ARTERY BYPASS GRAFT  1995   5 vessels  . INSERT / REPLACE / REMOVE PACEMAKER     Research officer, political party   . PACEMAKER INSERTION  2002  . REVERSE SHOULDER  ARTHROPLASTY Right 2013  . REVERSE TOTAL SHOULDER ARTHROPLASTY Left 2014  . TOTAL KNEE ARTHROPLASTY  2009   both knees    Allergies Review of patient's allergies indicates no known allergies.  Social History Social History  Substance Use Topics  . Smoking status: Former Smoker    Quit date: 02/15/1993  . Smokeless tobacco: Never Used     Comment: Quit in 1995  . Alcohol use 0.0 - 0.6 oz/week     Comment: occasional    Review of Systems Constitutional: Negative for  fever. Cardiovascular: Negative for chest pain.Positive for tachycardia Respiratory: Positive for shortness of breath Gastrointestinal: Negative for abdominal pain, vomiting and diarrhea. Genitourinary: Negative for dysuria. Musculoskeletal: Negative for back pain. Skin: Negative for rash. Neurological: Negative for headaches, positive for weakness  10-point ROS otherwise negative.  ____________________________________________   PHYSICAL EXAM:  VITAL SIGNS: ED Triage Vitals  Enc Vitals Group     BP      Pulse      Resp      Temp      Temp src      SpO2      Weight      Height      Head Circumference      Peak Flow      Pain Score      Pain Loc      Pain Edu?      Excl. in GC?     Constitutional: Alert and oriented. Moderate distress Eyes: Conjunctivae are normal. PERRL. Normal extraocular movements. ENT   Head: Normocephalic and atraumatic.   Nose: No congestion/rhinnorhea.   Mouth/Throat: Mucous membranes are moist.   Neck: No stridor. Cardiovascular: Rapid rate, regular rhythm. Possible murmur noted. Respiratory: Tachypnea with clear breath sounds Gastrointestinal: Soft and nontender. Normal bowel sounds Musculoskeletal: Nontender with normal range of motion in all extremities. No lower extremity tenderness nor edema. Neurologic:  Normal speech and language. No gross focal neurologic deficits are appreciated.  Skin:  Skin is warm, dry and intact. No rash noted. Psychiatric: Mood and affect are normal. Speech and behavior are normal.  ____________________________________________  EKG: Interpreted by me. White QRS tachycardia with a rate of 160 bpm, long QT, left axis deviation, likely inferior infarct age indeterminate.  Repeat EKG reveals dual-chamber pacemaker with a rate of 69 bpm, with occasional lack of atrial pacing ____________________________________________  ED COURSE:  Pertinent labs & imaging results that were available during my care  of the patient were reviewed by me and considered in my medical decision making (see chart for details). Clinical Course  Patient presents in acute distress from [facility] QRS tachycardia. He's been treated with IV magnesium and amiodarone. CRITICAL CARE Performed by: Emily Filbert   Total critical care time: 30 minutes  Critical care time was exclusive of separately billable procedures and treating other patients.  Critical care was necessary to treat or prevent imminent or life-threatening deterioration.  Critical care was time spent personally by me on the following activities: development of treatment plan with patient and/or surrogate as well as nursing, discussions with consultants, evaluation of patient's response to treatment, examination of patient, obtaining history from patient or surrogate, ordering and performing treatments and interventions, ordering and review of laboratory studies, ordering and review of radiographic studies, pulse oximetry and re-evaluation of patient's condition.  .Cardioversion Date/Time: 09/24/2015 7:58 PM Performed by: Emily Filbert Authorized by: Daryel November E   Consent:    Consent obtained:  Emergent situation Pre-procedure details:  Cardioversion basis:  Emergent   Rhythm:  Ventricular tachycardia   Electrode placement:  Anterior-lateral Attempt one:    Cardioversion mode:  Asynchronous   Waveform:  Biphasic   Shock (Joules):  150   Shock outcome:  Conversion to other rhythm Post-procedure details:    Patient status:  Awake   Patient tolerance of procedure:  Tolerated well, no immediate complications   ____________________________________________   LABS (pertinent positives/negatives)  Labs Reviewed  CBC - Abnormal; Notable for the following:       Result Value   RBC 3.47 (*)    Hemoglobin 11.1 (*)    HCT 33.2 (*)    RDW 20.1 (*)    Platelets 120 (*)    All other components within normal limits  TROPONIN I -  Abnormal; Notable for the following:    Troponin I 0.05 (*)    All other components within normal limits  COMPREHENSIVE METABOLIC PANEL - Abnormal; Notable for the following:    CO2 21 (*)    Glucose, Bld 133 (*)    BUN 32 (*)    Creatinine, Ser 1.77 (*)    AST 42 (*)    Total Bilirubin 1.6 (*)    GFR calc non Af Amer 35 (*)    GFR calc Af Amer 40 (*)    All other components within normal limits  PROTIME-INR - Abnormal; Notable for the following:    Prothrombin Time 18.4 (*)    All other components within normal limits  APTT  CBC    RADIOLOGY Images were viewed by me  Chest x-ray Reveals no acute findings ____________________________________________  FINAL ASSESSMENT AND PLAN  White QRS tachycardia, dyspnea, defibrillation, Elevated troponin  Plan: Patient with labs and imaging as dictated above. Initially we had treated the patient represented in stable ventricular tachycardia with IV amiodarone. Patient did not have successful conversion, the plan was to repeat this bolus and then place him on a drip however he subsequent he syncopized. During the syncopal event he was hypotensive, decision was made for emergent unsynchronized cardioversion with biphasic shock. Patient had return of circulation with improvement in his blood pressure and improved EKG with dual-chamber pacemaker spikes. I discussed with cardiology, Dr. Elberta Fortis who agrees with plan. I will discuss with eICU attending for admission.   Emily Filbert, MD   Note: This dictation was prepared with Dragon dictation. Any transcriptional errors that result from this process are unintentional    Emily Filbert, MD 09/24/15 2000    Emily Filbert, MD 09/24/15 2018

## 2015-09-24 NOTE — Consult Note (Signed)
CH arrived to provide pastoral support; spoke w/both PT & family. Offered compassionate presence & encouragement.   [facility] Jeff Celia [phone]

## 2015-09-25 ENCOUNTER — Inpatient Hospital Stay: Payer: Medicare Other

## 2015-09-25 ENCOUNTER — Encounter: Payer: Self-pay | Admitting: Physician Assistant

## 2015-09-25 DIAGNOSIS — J432 Centrilobular emphysema: Secondary | ICD-10-CM

## 2015-09-25 DIAGNOSIS — I272 Other secondary pulmonary hypertension: Secondary | ICD-10-CM

## 2015-09-25 DIAGNOSIS — I255 Ischemic cardiomyopathy: Secondary | ICD-10-CM

## 2015-09-25 DIAGNOSIS — I4901 Ventricular fibrillation: Secondary | ICD-10-CM | POA: Diagnosis present

## 2015-09-25 DIAGNOSIS — I472 Ventricular tachycardia: Principal | ICD-10-CM

## 2015-09-25 DIAGNOSIS — I25810 Atherosclerosis of coronary artery bypass graft(s) without angina pectoris: Secondary | ICD-10-CM

## 2015-09-25 LAB — BASIC METABOLIC PANEL
Anion gap: 11 (ref 5–15)
BUN: 33 mg/dL — AB (ref 6–20)
CO2: 20 mmol/L — ABNORMAL LOW (ref 22–32)
CREATININE: 2.05 mg/dL — AB (ref 0.61–1.24)
Calcium: 8.2 mg/dL — ABNORMAL LOW (ref 8.9–10.3)
Chloride: 103 mmol/L (ref 101–111)
GFR calc Af Amer: 34 mL/min — ABNORMAL LOW (ref >60)
GFR, EST NON AFRICAN AMERICAN: 29 mL/min — AB (ref >60)
GLUCOSE: 125 mg/dL — AB (ref 65–99)
POTASSIUM: 4.3 mmol/L (ref 3.5–5.1)
Sodium: 134 mmol/L — ABNORMAL LOW (ref 135–145)

## 2015-09-25 LAB — MAGNESIUM: Magnesium: 2.2 mg/dL (ref 1.7–2.4)

## 2015-09-25 LAB — CBC
HEMATOCRIT: 33.9 % — AB (ref 40.0–52.0)
Hemoglobin: 11.5 g/dL — ABNORMAL LOW (ref 13.0–18.0)
MCH: 32.1 pg (ref 26.0–34.0)
MCHC: 33.9 g/dL (ref 32.0–36.0)
MCV: 94.7 fL (ref 80.0–100.0)
PLATELETS: 109 10*3/uL — AB (ref 150–440)
RBC: 3.58 MIL/uL — AB (ref 4.40–5.90)
RDW: 19.7 % — ABNORMAL HIGH (ref 11.5–14.5)
WBC: 8.3 10*3/uL (ref 3.8–10.6)

## 2015-09-25 LAB — BLOOD GAS, ARTERIAL
Acid-base deficit: 3.6 mmol/L — ABNORMAL HIGH (ref 0.0–2.0)
BICARBONATE: 20.1 mmol/L (ref 20.0–28.0)
O2 SAT: 99.1 %
PATIENT TEMPERATURE: 37
PO2 ART: 136 mmHg — AB (ref 83.0–108.0)
pCO2 arterial: 31 mmHg — ABNORMAL LOW (ref 32.0–48.0)
pH, Arterial: 7.42 (ref 7.350–7.450)

## 2015-09-25 LAB — TROPONIN I
TROPONIN I: 1.18 ng/mL — AB (ref <0.03)
Troponin I: 0.89 ng/mL (ref <0.03)

## 2015-09-25 LAB — HEPARIN LEVEL (UNFRACTIONATED)
Heparin Unfractionated: 0.59 IU/mL (ref 0.30–0.70)
Heparin Unfractionated: 0.63 IU/mL (ref 0.30–0.70)

## 2015-09-25 LAB — PHOSPHORUS: Phosphorus: 3.5 mg/dL (ref 2.5–4.6)

## 2015-09-25 MED ORDER — SIMVASTATIN 40 MG PO TABS
40.0000 mg | ORAL_TABLET | Freq: Every day | ORAL | Status: DC
  Administered 2015-09-25 – 2015-09-29 (×5): 40 mg via ORAL
  Filled 2015-09-25 (×5): qty 1

## 2015-09-25 MED ORDER — FAMOTIDINE 20 MG PO TABS
20.0000 mg | ORAL_TABLET | Freq: Every day | ORAL | Status: DC
  Administered 2015-09-25 – 2015-09-30 (×6): 20 mg via ORAL
  Filled 2015-09-25 (×6): qty 1

## 2015-09-25 MED ORDER — MORPHINE SULFATE (PF) 2 MG/ML IV SOLN
1.0000 mg | INTRAVENOUS | Status: DC | PRN
  Administered 2015-09-29 (×2): 1 mg via INTRAVENOUS
  Administered 2015-09-30: 2 mg via INTRAVENOUS
  Filled 2015-09-25 (×3): qty 1

## 2015-09-25 MED ORDER — MORPHINE SULFATE (PF) 2 MG/ML IV SOLN: 1.0000 mg | Freq: Once | INTRAVENOUS | Status: DC | PRN

## 2015-09-25 MED ORDER — LEVOTHYROXINE SODIUM 75 MCG PO TABS
75.0000 ug | ORAL_TABLET | Freq: Every day | ORAL | Status: DC
  Administered 2015-09-25 – 2015-09-30 (×6): 75 ug via ORAL
  Filled 2015-09-25 (×6): qty 1

## 2015-09-25 MED ORDER — AMIODARONE HCL IN DEXTROSE 360-4.14 MG/200ML-% IV SOLN
30.0000 mg/h | INTRAVENOUS | Status: DC
  Administered 2015-09-25 – 2015-09-26 (×2): 30 mg/h via INTRAVENOUS
  Filled 2015-09-25 (×2): qty 200

## 2015-09-25 MED ORDER — MONTELUKAST SODIUM 10 MG PO TABS
10.0000 mg | ORAL_TABLET | Freq: Every day | ORAL | Status: DC
  Administered 2015-09-25 – 2015-09-29 (×6): 10 mg via ORAL
  Filled 2015-09-25 (×6): qty 1

## 2015-09-25 MED ORDER — IPRATROPIUM-ALBUTEROL 0.5-2.5 (3) MG/3ML IN SOLN: 3.0000 mL | RESPIRATORY_TRACT | Status: DC | PRN

## 2015-09-25 MED ORDER — CARVEDILOL 3.125 MG PO TABS
3.1250 mg | ORAL_TABLET | Freq: Two times a day (BID) | ORAL | Status: DC
  Administered 2015-09-25 – 2015-09-29 (×8): 3.125 mg via ORAL
  Filled 2015-09-25 (×10): qty 1

## 2015-09-25 MED ORDER — MORPHINE SULFATE (PF) 2 MG/ML IV SOLN: 1.0000 mg | INTRAVENOUS | Status: DC

## 2015-09-25 MED ORDER — CLOPIDOGREL BISULFATE 75 MG PO TABS
75.0000 mg | ORAL_TABLET | Freq: Every day | ORAL | Status: DC
  Administered 2015-09-25 – 2015-09-30 (×6): 75 mg via ORAL
  Filled 2015-09-25 (×6): qty 1

## 2015-09-25 MED ORDER — MORPHINE SULFATE (PF) 2 MG/ML IV SOLN
INTRAVENOUS | Status: AC
  Filled 2015-09-25: qty 1

## 2015-09-25 NOTE — H&P (Signed)
PULMONARY / CRITICAL CARE MEDICINE   Name: Tom Nguyen MRN: [id number] DOB: Apr 03, 1932    ADMISSION DATE:  09/24/2015 CONSULTATION DATE:  09/24/15  REFERRING MD:  Dr. Mayford Knife  CHIEF COMPLAINT:  Chest pain and shortness of breath  HISTORY OF PRESENT ILLNESS:   Tom Nguyen is an 82 year old male with past medical history of Asthma,CAD, CHF- Class 3,,COPD,s/p AICDHypertension, Hyperlipidemia, MI, and sleep apnea. Patient started experiencing chest pain and shortness of breath startin approximately at 6 pm on 9/7 with un tolerable headache.  Patient took two Nitroglycerine tablet , but his chest pain was unrelieved therefore he came to [facility] ED on 9/7. In the ED the patient was in V-tach and was symptomatic.  Patient was defibrillated with 150 joules. Patient was started on amiodarone.  Patient is followed by Dr. Mariah Milling .  CCm team was called  To admit the patient.  PAST MEDICAL HISTORY :  He  has a past medical history of Arthritis; Asthma; CAD (coronary artery disease); Chronic combined systolic and diastolic CHF, NYHA class 3 (HCC); Chronic diarrhea; Chronic respiratory failure (HCC); COPD (chronic obstructive pulmonary disease) (HCC); Emphysema of lung (HCC); History of GI bleed; Hyperlipidemia; Hypertension; Hypothyroid; Ischemic cardiomyopathy; LBBB (left bundle branch block); MI (myocardial infarction) (HCC) (1995); and Sleep apnea.  PAST SURGICAL HISTORY: He  has a past surgical history that includes Coronary artery bypass graft (1995); Pacemaker insertion (2002); Reverse shoulder arthroplasty (Right, 2013); Reverse total shoulder arthroplasty (Left, 2014); Total knee arthroplasty (2009); Cardiac catheterization; Insert / replace / remove pacemaker; Cataract extraction; and Cardiac catheterization (N/A, 08/11/2015).  No Known Allergies  No current facility-administered medications on file prior to encounter.    Current Outpatient Prescriptions on File Prior to Encounter  Medication Sig  .  acetaminophen (TYLENOL) 500 MG tablet Take 500 mg by mouth every 6 (six) hours as needed.  Marland Kitchen albuterol (PROVENTIL HFA;VENTOLIN HFA) 108 (90 Base) MCG/ACT inhaler Inhale 2 puffs into the lungs every 4 (four) hours as needed for wheezing or shortness of breath.  Marland Kitchen alendronate (FOSAMAX) 70 MG tablet Take 70 mg by mouth once a week.   Marland Kitchen allopurinol (ZYLOPRIM) 100 MG tablet Take 100 mg by mouth daily.   Marland Kitchen amiodarone (PACERONE) 200 MG tablet Take 1 tablet (200 mg total) by mouth daily.  . carvedilol (COREG) 3.125 MG tablet Take 1 tablet (3.125 mg total) by mouth 2 (two) times daily with a meal.  . folic acid (FOLVITE) 1 MG tablet Take 1 mg by mouth daily.   . furosemide (LASIX) 40 MG tablet Take 1 tablet (40 mg total) by mouth 2 (two) times daily.  . hydroxychloroquine (PLAQUENIL) 200 MG tablet Take 400 mg by mouth daily.   Marland Kitchen leflunomide (ARAVA) 20 MG tablet Take 20 mg by mouth daily.   Marland Kitchen levothyroxine (SYNTHROID, LEVOTHROID) 75 MCG tablet Take 1 tablet (75 mcg total) by mouth daily before breakfast.  . montelukast (SINGULAIR) 10 MG tablet Take 10 mg by mouth daily.   . Multiple Vitamins-Minerals (MULTIVITAMIN ADULT PO) Take 1 tablet by mouth daily.   . nitroGLYCERIN (NITROLINGUAL) 0.4 MG/SPRAY spray Place 1 spray under the tongue every 5 (five) minutes x 3 doses as needed for chest pain.  Marland Kitchen omeprazole (PRILOSEC) 20 MG capsule Take 1 capsule (20 mg total) by mouth daily.  . Potassium Chloride ER 20 MEQ TBCR Take 20 mEq by mouth daily.  . sacubitril-valsartan (ENTRESTO) 24-26 MG Take 1 tablet by mouth 2 (two) times daily.  . simvastatin (ZOCOR) 40  MG tablet Take 1 tablet (40 mg total) by mouth daily.  Marland Kitchen umeclidinium-vilanterol (ANORO ELLIPTA) 62.5-25 MCG/INH AEPB Inhale 1 puff into the lungs daily.  Marland Kitchen albuterol (PROVENTIL) (2.5 MG/3ML) 0.083% nebulizer solution Take 3 mLs (2.5 mg total) by nebulization every 4 (four) hours. For wheezing/SOB (Patient not taking: Reported on 09/24/2015)    FAMILY HISTORY:   His indicated that his mother is deceased. He indicated that his father is deceased.    SOCIAL HISTORY: He  reports that he quit smoking about 22 years ago. He has never used smokeless tobacco. He reports that he drinks alcohol. He reports that he does not use drugs.  REVIEW OF SYSTEMS:   Review of Systems  Constitutional: Negative for malaise/fatigue and weight loss.  HENT: Negative for congestion, ear discharge, ear pain and nosebleeds.   Eyes: Negative for photophobia, pain, discharge and redness.  Respiratory: Positive for shortness of breath.   Cardiovascular: Positive for chest pain.  Gastrointestinal: Negative for abdominal pain, constipation, diarrhea and vomiting.  Genitourinary: Negative for frequency and urgency.  Musculoskeletal: Negative for back pain, joint pain and neck pain.  Skin: Negative for itching and rash.  Neurological: Positive for headaches. Negative for tingling, tremors, sensory change and speech change.  Endo/Heme/Allergies: Negative for environmental allergies.  Psychiatric/Behavioral: Negative for hallucinations and substance abuse. The patient is not nervous/anxious and does not have insomnia.      SUBJECTIVE:  Patient states that he feels much better now.  Not in any pain at this time. Will continue to monitor.  VITAL SIGNS: BP 105/70   Pulse 72   Temp 98 F (36.7 C)   Resp 18   Ht 5\' 8"  (1.727 m)   Wt 76.2 kg (168 lb)   SpO2 100%   BMI 25.54 kg/m   HEMODYNAMICS:    VENTILATOR SETTINGS:    INTAKE / OUTPUT: No intake/output data recorded.  PHYSICAL EXAMINATION: General:  Elderly white male, Appears to be in no acute distress. Neuro:  Awake, alert, oriented, follows command, no focal deficits HEENT:  Ataumatic, normocephalic, no JVD Appreciated Cardiovascular:  Paced, regular, no MRG noted Lungs:  Clear bilaterally, no wheezes, crackles, rhonchi noted Abdomen:  Soft, nontender, active bowel sounds Musculoskeletal: no  inflammation/deformity noted Skin:  Grossly intact  LABS:  BMET  Recent Labs Lab 09/24/15 1927  NA 136  K 4.2  CL 101  CO2 21*  BUN 32*  CREATININE 1.77*  GLUCOSE 133*    Electrolytes  Recent Labs Lab 09/24/15 1927  CALCIUM 8.9    CBC  Recent Labs Lab 09/24/15 1927  WBC 7.5  HGB 11.1*  HCT 33.2*  PLT 120*    Coag's  Recent Labs Lab 09/24/15 1927  APTT 35  INR 1.51    Sepsis Markers No results for input(s): LATICACIDVEN, PROCALCITON, O2SATVEN in the last 168 hours.  ABG No results for input(s): PHART, PCO2ART, PO2ART in the last 168 hours.  Liver Enzymes  Recent Labs Lab 09/24/15 1927  AST 42*  ALT 26  ALKPHOS 97  BILITOT 1.6*  ALBUMIN 3.6    Cardiac Enzymes  Recent Labs Lab 09/24/15 1927  TROPONINI 0.05*    Glucose  Recent Labs Lab 09/24/15 2226  GLUCAP 142*    Imaging Dg Chest Port 1 View  Result Date: 09/24/2015 CLINICAL DATA:  Dyspnea EXAM: PORTABLE CHEST 1 VIEW COMPARISON:  08/12/2015 FINDINGS: Cardiomegaly again noted. Status post median sternotomy. 3 leads cardiac pacemaker is unchanged in position. No infiltrate or pulmonary edema. Bilateral  shoulder prosthesis again noted. IMPRESSION: Cardiomegaly. No active disease. Cardiac pacemaker unchanged in position. Electronically Signed   By: Natasha Mead M.D.   On: 09/24/2015 20:24     STUDIES:  08/06/15 ECHO>>The cavity size was moderately dilated. Systolic  function was severely reduced. The estimated ejection fraction  was in the range of 25% to 30%.Aorta: Aortic root is mildly dilated.Mitral valve: There was moderate regurgitation.Left atrium: The atrium was moderately to severely dilated. - Right ventricle: The cavity size was moderately dilated  CULTURES: NONE  ANTIBIOTICS: NONE SIGNIFICANT EVENTS: 9/8 >>Patient admitted to the ICU for chest pain and shortness of breath  LINES/TUBES: none  DISCUSSION: 82yo male came with chest pain and shortness of breath, went  into V-TACH .Was shocked with 150 joules.  Patient's AICD did not fire.  ?If AICD is functioning properly  ASSESSMENT / PLAN:  PULMONARY A: Hx of COPD Pulmonary Hypertension Sleep apnea P:   Support with O2 to keep sats> 92% duoneb prn Continue Singulair  CARDIOVASCULAR A:  NSTEMI CAD Tachyrhythmia HYPERTENSION S/p ICD Elevated troponin Chronic systolic heart failure with EF OF 25-30% P:  Heparin GTT Amiodarone gtt Continue Coreg Trend troponin Cardiology consulted Received 2 gm Mg   RENAL A:   Chronic Kidney disease stage- 3 P:   Strict I/o Follow chemistry Replace electrolytes per ICU protocol  GASTROINTESTINAL A:   No active issues P:   pepcid for GIP  HEMATOLOGIC A:   Thrombocytopenia P:  Transfuse if Hgb<7 SCDS INFECTIOUS A:   No active issues P:   Monitor fever curve  ENDOCRINE A:   Hypothyroidism P:   Continue synthroid NEUROLOGIC A:   No active issues P:   RASS goal: 0    FAMILY  - Updates: Wife was updated regarding the patient's condition    Ivor Kishi,AG-ACNP Pulmonary and Critical Care Medicine [facility]  09/25/2015, 1:01 AM

## 2015-09-25 NOTE — Progress Notes (Signed)
Patient complains of chest pressure in mid sternal. Stated it feels like his heart is racing. VSS. Heart rate on monitor 65 paced. Notified Dr. Mariah Milling orders for morphine given

## 2015-09-25 NOTE — Consult Note (Signed)
Cardiology Consultation Note  Patient ID: Tom Nguyen, MRN: [id number], DOB/AGE: Aug 04, 1932 82 y.o. Admit date: 09/24/2015   Date of Consult: 09/25/2015 Primary Physician: Olevia Perches, DO Primary Cardiologist: Dr. Mariah Milling, MD Requesting Physician: Dr. Belia Heman, MD  Chief Complaint: Chest pain Reason for Consult: VT arrest s/p defibrillation and IV amiodarone   HPI: 82 y.o. male with h/o CAD s/p CABG in 1995, chronic combined CHF NYHA class 3/ICM with EF of 25-30% by echo on 08/06/2015 s/p [facility] Bi-V ICD, underlying LBBB, chronic respiratory failure 2/2 COPD with long history of tobacco abuse, OSA on CPAP who presented on 9/7 with chest pain, SOB, diaphoresis, and palpitations around 3 PM on 9/7 and was found to be in symptomatic sustained VT upon arrival to the ED with heart rates in the 160's bpm (device threshold for VT on his ICD is 175 bpm). He underwent emergent defibrillation at 150 joules followed by initiation of IV amiodarone.   He was recently admitted to [facility] in late July 2/2 acute on chronic respiratory failure 2/2 acute on chronic combined CHF and COPD exacerbation. He underwent R/LHC during that showed akinetic inferior wall scar, as well as significant 3-vessel CAD with patent SVG to diagonal, SVG to RPDA. SVG to LCx was occluded. Along the native LAD there was 70% heavily calcified stenosis that was not felt to be amenable to intervention. Medical management was recommended. Of note, he was noted to have episodes of tachycardia during the cath and his Coreg was increased at that time. RHC showed severe pulmonary HTN with PA pressure of 55, PCWP 32 and severely reduced cardiac output at 2.95 with a cardiac index of 1.59. He was diuresed and discharged. At his EP follow up Coreg was reduced along with reduction in amiodarone 2/2 allow for diuresis as he was noted to be volume overloaded. When he followed up with Dr. Mariah Milling in late August he was doing well. Noted to be 4 pounds over  dry weight. BP precluded titration of medications.   He was in his USOH until 9/7 around 3 PM when he developed sudden onset of chest pain, palpitations, diaphoresis, and SOB. He called his wife to come home and call EMS. The family decided to call their daughter first and waited for her to arrive and assess him. They then drive him to the ED by private car.    Upon the patient's arrival to [facility] they were found to have troponin of 0.05 at 19:38 on 9/7 (none since), magnesium 2.2, potassium 4.2-->4.3, HGB 11.1, WBC 7.5, SCr 1.77-->2.05. ECG showed sustained VT, 168 bpm, CXR showed cardiomegaly without active disease. Repeat CXR this morning showed cardiomegaly with mild congestive changes. No focal consolidation or edema. He has remained in NSR since defibrillation in the ED as above and starting IV amiodarone.    Past Medical History:  Diagnosis Date  . Arthritis   . Asthma   . CAD (coronary artery disease)    a. s/p CABG 1995; b. stress test 2006: inf post lat infarction w/ peri-infarct ischemia, AK inferior wall & HK inferolat wall, EF 28%. unchanged from prior study 07/2002.  Marland Kitchen Chronic combined systolic and diastolic CHF, NYHA class 3 (HCC)    a. 2007 EF 40%; b. 10/2008: EF 35% by echo; c. echo 10/16: EF 45-50%, sev inf/post wall HK, conduction abnormality, mild MR, mod dilated LA, PASP 47 mm Hg  . Chronic diarrhea   . Chronic respiratory failure (HCC)   . COPD (chronic obstructive pulmonary disease) (  HCC)    on 2L o2 at home  . Emphysema of lung (HCC)   . History of GI bleed   . Hyperlipidemia   . Hypertension   . Hypothyroid   . Ischemic cardiomyopathy   . LBBB (left bundle branch block)   . MI (myocardial infarction) (HCC) 1995  . Sleep apnea    a. on CPAP  . Ventricular tachyarrhythmia (HCC) 09/24/2015      Most Recent Cardiac Studies: [facility] 08/11/2015: Conclusion     Mid RCA lesion, 100 %stenosed.  Prox RCA lesion, 80 %stenosed.  SVG graft was visualized by angiography  and is normal in caliber and anatomically normal.  Ost 1st Diag to 1st Diag lesion, 100 %stenosed.  Ost LM to LM lesion, 30 %stenosed.  Ost Cx to Prox Cx lesion, 100 %stenosed.  Ost LAD lesion, 70 %stenosed.  SVG.  Origin lesion, 100 %stenosed.  SVG graft was visualized by angiography and is large.  The graft exhibits mild .  There is no aortic valve stenosis.  Hemodynamic findings consistent with moderate pulmonary hypertension.   1. Right heart catheterization showed moderate to severe pulmonary hypertension with PA pressure of 55 reports 31 mmHg with pulmonary wedge pressure of 32 mmHg and severely reduced cardiac output at 2.95 with a cardiac index of 1.59. 2. Significant underlying three-vessel coronary artery disease with patent SVG to diagonal and SVG to right PDA. SVG to left circumflex is occluded. Native LAD has 70% proximal heavily calcified stenosis. 3. Severely reduced LV systolic function by echocardiogram. Left ventricular angiography was not performed.  Recommendations: The patient is still significantly volume overloaded with severely reduced cardiac output. I resumed IV diuresis. He was noted to have intermittent episodes of tachycardia throughout the case and thus I increased the dose of carvedilol to 6.25 mg twice daily. There are no good options for revascularization. I doubt that intervening on the LAD would make a difference in his LV systolic function.  Echo 08/06/2015: Study Conclusions  - Left ventricle: The cavity size was moderately dilated. Systolic   function was severely reduced. The estimated ejection fraction   was in the range of 25% to 30%. Diffuse hypokinesis. Severe   hypokinesis, possible akinesis of the inferior/posterior   myocardium. Doppler parameters are consistent with abnormal left   ventricular relaxation. - Aorta: Aortic root is mildly dilated, dimension: 37 mm (ED). - Mitral valve: There was moderate regurgitation. - Left  atrium: The atrium was moderately to severely dilated. - Right ventricle: The cavity size was moderately dilated. Wall   thickness was normal. Systolic function was moderately reduced. - Tricuspid valve: There was moderate regurgitation. - Pulmonary arteries: Systolic pressure was moderately elevated. PA   peak pressure: 58 mm Hg (S). - Inferior vena cava: The vessel was dilated. The respirophasic   diameter changes were blunted (< 50%), consistent with elevated   central venous pressure.     Surgical History:  Past Surgical History:  Procedure Laterality Date  . CARDIAC CATHETERIZATION    . CARDIAC CATHETERIZATION N/A 08/11/2015   Procedure: Right/Left Heart Cath and Coronary/Graft Angiography;  Surgeon: Iran Ouch, MD;  Location: [facility] INVASIVE CV LAB;  Service: Cardiovascular;  Laterality: N/A;  . CATARACT EXTRACTION    . CORONARY ARTERY BYPASS GRAFT  1995   5 vessels  . INSERT / REPLACE / REMOVE PACEMAKER     [employer], political party   . PACEMAKER INSERTION  2002  . REVERSE SHOULDER ARTHROPLASTY Right 2013  . REVERSE TOTAL  SHOULDER ARTHROPLASTY Left 2014  . TOTAL KNEE ARTHROPLASTY  2009   both knees     Home Meds: Prior to Admission medications   Medication Sig Start Date End Date Taking? Authorizing Provider  acetaminophen (TYLENOL) 500 MG tablet Take 500 mg by mouth every 6 (six) hours as needed.   Yes Historical Provider, MD  albuterol (PROVENTIL HFA;VENTOLIN HFA) 108 (90 Base) MCG/ACT inhaler Inhale 2 puffs into the lungs every 4 (four) hours as needed for wheezing or shortness of breath. 03/05/15  Yes Erin FullingKurian Kasa, MD  alendronate (FOSAMAX) 70 MG tablet Take 70 mg by mouth once a week.  04/08/15  Yes Historical Provider, MD  allopurinol (ZYLOPRIM) 100 MG tablet Take 100 mg by mouth daily.  08/25/14  Yes Historical Provider, MD  amiodarone (PACERONE) 200 MG tablet Take 1 tablet (200 mg total) by mouth daily. 09/02/15  Yes Duke SalviaSteven C Klein, MD  carvedilol (COREG) 3.125 MG  tablet Take 1 tablet (3.125 mg total) by mouth 2 (two) times daily with a meal. 09/02/15  Yes Duke SalviaSteven C Klein, MD  folic acid (FOLVITE) 1 MG tablet Take 1 mg by mouth daily.  08/25/14  Yes Historical Provider, MD  furosemide (LASIX) 40 MG tablet Take 1 tablet (40 mg total) by mouth 2 (two) times daily. 08/26/15  Yes Antonieta Ibaimothy J Gollan, MD  hydroxychloroquine (PLAQUENIL) 200 MG tablet Take 400 mg by mouth daily.  09/26/14  Yes Historical Provider, MD  leflunomide (ARAVA) 20 MG tablet Take 20 mg by mouth daily.  08/25/14  Yes Historical Provider, MD  levothyroxine (SYNTHROID, LEVOTHROID) 75 MCG tablet Take 1 tablet (75 mcg total) by mouth daily before breakfast. 06/12/15  Yes Jayce G Cook, DO  montelukast (SINGULAIR) 10 MG tablet Take 10 mg by mouth daily.  01/25/15  Yes Historical Provider, MD  Multiple Vitamins-Minerals (MULTIVITAMIN ADULT PO) Take 1 tablet by mouth daily.    Yes Historical Provider, MD  nitroGLYCERIN (NITROLINGUAL) 0.4 MG/SPRAY spray Place 1 spray under the tongue every 5 (five) minutes x 3 doses as needed for chest pain.   Yes Historical Provider, MD  omeprazole (PRILOSEC) 20 MG capsule Take 1 capsule (20 mg total) by mouth daily. 06/17/15  Yes Tommie SamsJayce G Cook, DO  Potassium Chloride ER 20 MEQ TBCR Take 20 mEq by mouth daily. 08/26/15  Yes Antonieta Ibaimothy J Gollan, MD  sacubitril-valsartan (ENTRESTO) 24-26 MG Take 1 tablet by mouth 2 (two) times daily. 09/15/15  Yes Antonieta Ibaimothy J Gollan, MD  simvastatin (ZOCOR) 40 MG tablet Take 1 tablet (40 mg total) by mouth daily. 08/26/15  Yes Antonieta Ibaimothy J Gollan, MD  umeclidinium-vilanterol (ANORO ELLIPTA) 62.5-25 MCG/INH AEPB Inhale 1 puff into the lungs daily. 08/26/15  Yes Merwyn Katosavid B Simonds, MD  albuterol (PROVENTIL) (2.5 MG/3ML) 0.083% nebulizer solution Take 3 mLs (2.5 mg total) by nebulization every 4 (four) hours. For wheezing/SOB Patient not taking: Reported on 09/24/2015 03/05/15   Erin FullingKurian Kasa, MD    Inpatient Medications:  . carvedilol  3.125 mg Oral BID WC  . famotidine  (PEPCID) IV  20 mg Intravenous Q12H  . levothyroxine  75 mcg Oral QAC breakfast  . montelukast  10 mg Oral QHS  . pneumococcal 23 valent vaccine  0.5 mL Intramuscular Tomorrow-1000  . simvastatin  40 mg Oral q1800  . sodium chloride flush  3 mL Intravenous Q12H   . amiodarone    . heparin 900 Units/hr (09/25/15 0800)    Allergies: No Known Allergies  Social History   Social History  .  Marital status: Married    Spouse name: N/A  . Number of children: N/A  . Years of education: N/A   Occupational History  . Not on file.   Social History Main Topics  . Smoking status: Former Smoker    Quit date: 02/15/1993  . Smokeless tobacco: Never Used     Comment: Quit in 1995  . Alcohol use 0.0 - 0.6 oz/week     Comment: occasional  . Drug use: No  . Sexual activity: Not on file   Other Topics Concern  . Not on file   Social History Narrative   Lives at home with wife, has both cane and a walker.   Mobility limited due to breathing/dyspnea     Family History  Problem Relation Age of Onset  . Hyperlipidemia Father   . Stroke Father   . Hypertension Father   . Kidney disease Father   . Kidney failure Mother     Was on dialysis     Review of Systems: Review of Systems  Constitutional: Positive for malaise/fatigue. Negative for chills, diaphoresis, fever and weight loss.  HENT: Negative for congestion.   Eyes: Negative for discharge and redness.  Respiratory: Positive for shortness of breath. Negative for cough, hemoptysis, sputum production and wheezing.   Cardiovascular: Positive for chest pain, palpitations and leg swelling. Negative for orthopnea, claudication and PND.  Gastrointestinal: Negative for abdominal pain, blood in stool, heartburn, melena, nausea and vomiting.  Genitourinary: Negative for hematuria.  Musculoskeletal: Negative for falls and myalgias.  Skin: Negative for rash.  Neurological: Positive for weakness. Negative for dizziness, tingling, tremors,  sensory change, speech change, focal weakness and loss of consciousness.  Endo/Heme/Allergies: Does not bruise/bleed easily.  Psychiatric/Behavioral: Negative for substance abuse. The patient is not nervous/anxious.   All other systems reviewed and are negative.   Labs:  Recent Labs  09/24/15 1927  TROPONINI 0.05*   Lab Results  Component Value Date   WBC 8.3 09/25/2015   HGB 11.5 (L) 09/25/2015   HCT 33.9 (L) 09/25/2015   MCV 94.7 09/25/2015   PLT 109 (L) 09/25/2015     Recent Labs Lab 09/24/15 1927 09/25/15 0555  NA 136 134*  K 4.2 4.3  CL 101 103  CO2 21* 20*  BUN 32* 33*  CREATININE 1.77* 2.05*  CALCIUM 8.9 8.2*  PROT 6.5  --   BILITOT 1.6*  --   ALKPHOS 97  --   ALT 26  --   AST 42*  --   GLUCOSE 133* 125*   Lab Results  Component Value Date   CHOL 91 10/20/2014   HDL 40.00 10/20/2014   LDLCALC 28 10/20/2014   TRIG 112.0 10/20/2014   No results found for: DDIMER  Radiology/Studies:  Dg Chest Port 1 View  Result Date: 09/25/2015 CLINICAL DATA:  82 year old male with shortness of breath. EXAM: PORTABLE CHEST 1 VIEW COMPARISON:  Chest radiograph dated 09/24/2015 FINDINGS: There is stable moderate cardiomegaly with minimal central vascular prominence. There is no focal consolidation, pleural effusion, or pneumothorax. Right apical pleural thickening. Left pectoral AICD device and median sternotomy wires noted. There is osteopenia with degenerative changes of the spine. Bilateral shoulder arthroplasty. No acute fracture. IMPRESSION: Cardiomegaly with probable mild congestive changes. No focal consolidation or edema. Electronically Signed   By: Elgie Collard M.D.   On: 09/25/2015 02:59   Dg Chest Port 1 View  Result Date: 09/24/2015 CLINICAL DATA:  Dyspnea EXAM: PORTABLE CHEST 1 VIEW COMPARISON:  08/12/2015 FINDINGS:  Cardiomegaly again noted. Status post median sternotomy. 3 leads cardiac pacemaker is unchanged in position. No infiltrate or pulmonary edema.  Bilateral shoulder prosthesis again noted. IMPRESSION: Cardiomegaly. No active disease. Cardiac pacemaker unchanged in position. Electronically Signed   By: Natasha Mead M.D.   On: 09/24/2015 20:24    EKG: Interpreted by me showed: Sustained VT, 168 bpm Telemetry: Interpreted by me showed: currently sinus rhythm 70's bpm, sustained VT in the evening of 9/7 with a rate in the 160's bpm  Weights: [facility] Weights   09/24/15 2038 09/25/15 0325  Weight: 168 lb (76.2 kg) 174 lb 2.6 oz (79 kg)     Physical Exam: Blood pressure 108/74, pulse 71, temperature (!) 96.6 F (35.9 C), temperature source Axillary, resp. rate 16, height 5\' 8"  (1.727 m), weight 174 lb 2.6 oz (79 kg), SpO2 97 %. Body mass index is 26.48 kg/m. General: Frail appearing, in no acute distress. Head: Normocephalic, atraumatic, sclera non-icteric, no xanthomas, nares are without discharge.  Neck: Negative for carotid bruits. JVD not elevated. Lungs: Clear bilaterally to auscultation without wheezes, rales, or rhonchi. Breathing is unlabored. Heart: RRR with S1 S2. No murmurs, rubs, or gallops appreciated. Abdomen: Soft, non-tender, non-distended with normoactive bowel sounds. No hepatomegaly. No rebound/guarding. No obvious abdominal masses. Msk:  Strength and tone appear normal for age. Extremities: No clubbing or cyanosis. No edema. Distal pedal pulses are 2+ and equal bilaterally. Neuro: Alert and oriented X 3. No facial asymmetry. No focal deficit. Moves all extremities spontaneously. Psych:  Responds to questions appropriately with a normal affect.    Assessment and Plan:  Principal Problem:   Sustained ventricular fibrillation (HCC) Active Problems:   Cardiomyopathy, ischemic   Pulmonary HTN (HCC)   Centrilobular emphysema (HCC)   Atherosclerosis of coronary artery bypass graft of native heart without angina pectoris   Chronic systolic heart failure (HCC)    1. Symptomatic sustained ventricular  tachycardia: -Status post defibrillation at 150 joules in the ED with successful conversion to NSR -Continue IV amiodarone at half-dose -Unable to add back beta blocker 2/2 hypotension with SBP ranging from 60 systolic in the ED to 90's systolic in the ICU -Will need PO amiodarone 400 mg bid at time of discharge until he can be seen by EP -Will have rep come out to lower device sensing threshold for VT to below 160 bpm (currently set at 175 bpm) -The slower VT was likely in the setting of the patient's amiodarone at home -Case discussed with EP who agrees with the above and will follow -Magnesium and potassium at goal -TSH pending  2. Acute on chronic combined systolic and diastolic CHF: -Weight remains elevated at 174 pounds at time of admission (dry weight of 164 pounds) -Unable to diuresis at this time with Lasix 2/2 hypotension -Restart Lasix when able -Unable to start, ACEi/ARB/Entresto, beta blocker 2/2 hypotension -Not on spironolactone 2/2 hypotension and AKI -CHF education  3. CAD as above: -Initial troponin mildly elevated and c/w prior readings -Recheck stat troponin this morning -Less likely to require repeat ischemic evaluation unless troponin trends significantly upwards -Start Plavix 75 mg daily -Beta blocker as above -Statin  4. Acute on CKD stage III: -Possibly ATN in the setting of hypotension  -Monitor -Avoid nephrotoxins    Signed, [employer], PA-C [facility] Pager: [phone] 09/25/2015, 9:07 AM

## 2015-09-25 NOTE — Progress Notes (Signed)
[facility]     Assessment and Plan:  Patient chart, labs reviewed, patient seen and examined. Patient admitted earlier this am history of Arthritis; Asthma; CAD (coronary artery disease); Chronic combined systolic and diastolic CHF, NYHA class 3 (HCC); Chronic diarrhea; Chronic respiratory failure (HCC); COPD (chronic obstructive pulmonary disease) (HCC); Emphysema of lung (HCC); History of GI bleed; Ischemic cardiomyopathy; LBBB (left bundle branch block); MI (myocardial infarction) (HCC) (1995); and Sleep apnea.  Admitted overnight with V-tach, patients implanted AICD did not fire. He has been loaded with amiodarone, his rate is stable at 71. Labs reviewed, Mag OK, mild AKI.   Continue amiodarone, monitor labs, vitals. Will discuss with hospitalist physician, EP following.    Date: 09/25/2015  MRN# [id number] Tom Nguyen April 12, 1976   Tom Nguyen is a 82 y.o. old male seen in follow up for chief complaint of  Chief Complaint  Patient presents with  . Chest Pain     HPI:     Medication:   @ENCMED @   Allergies:  Review of patient's allergies indicates no known allergies.  Review of Systems: Gen:  Denies  fever, sweats. HEENT: Denies blurred vision. Cvc:  No dizziness, chest pain or heaviness Resp:   Denies cough or sputum porduction. Gi: Denies swallowing difficulty, stomach pain. constipation, bowel incontinence Gu:  Denies bladder incontinence, burning urine Ext:   No Joint pain, stiffness. Skin: No skin rash, easy bruising. Endoc:  No polyuria, polydipsia. Psych: No depression, insomnia. Other:  All other systems were reviewed and found to be negative other than what is mentioned in the HPI.   Physical Examination:   VS: BP 108/74 (BP Location: Right Arm)   Pulse 71   Temp (!) 96.6 F (35.9 C) (Axillary)   Resp 16   Ht 5\' 8"  (1.727 m)   Wt 174 lb 2.6 oz (79 kg)   SpO2 97%   BMI 26.48 kg/m   General Appearance: No distress    Neuro:without focal findings,  speech normal,  HEENT: PERRLA, EOM intact. Pulmonary: normal breath sounds, No wheezing.   CardiovascularNormal S1,S2.  No m/r/g.   Abdomen: Benign, Soft, non-tender. Renal:  No costovertebral tenderness  GU:  Not performed at this time. Endoc: No evident thyromegaly, no signs of acromegaly. Skin:   warm, no rash. Extremities: normal, no cyanosis, clubbing.   LABORATORY PANEL:   CBC  Recent Labs Lab 09/25/15 0555  WBC 8.3  HGB 11.5*  HCT 33.9*  PLT 109*   ------------------------------------------------------------------------------------------------------------------  Chemistries   Recent Labs Lab 09/24/15 1927 09/25/15 0555  NA 136 134*  K 4.2 4.3  CL 101 103  CO2 21* 20*  GLUCOSE 133* 125*  BUN 32* 33*  CREATININE 1.77* 2.05*  CALCIUM 8.9 8.2*  MG  --  2.2  AST 42*  --   ALT 26  --   ALKPHOS 97  --   BILITOT 1.6*  --    ------------------------------------------------------------------------------------------------------------------  Cardiac Enzymes  Recent Labs Lab 09/24/15 1927  TROPONINI 0.05*   ------------------------------------------------------------  RADIOLOGY:   No results found for this or any previous visit. Results for orders placed during the hospital encounter of 04/13/15  DG Chest 2 View   Narrative CLINICAL DATA:  Shortness of breath for few days  EXAM: CHEST  2 VIEW  COMPARISON:  12/16/2014  FINDINGS: Cardiac shadow is mildly enlarged but stable. A defibrillator is again seen in stable position. The lungs are well aerated bilaterally. Mild thickening of the minor fissure on  the right is seen. No focal infiltrate or sizable effusion is noted.  IMPRESSION: No acute abnormality noted.   Electronically Signed   By: Alcide Clever M.D.   On: 04/13/2015 08:06    ------------------------------------------------------------------------------------------------------------------  Thank  you  for allowing [facility] to assist in the care of your patient. Our recommendations are noted above.  Please contact us if we can be of further service.   Wells Guiles, MD.  [facility] Office Number: [phone]  Santiago Glad, M.D.  Stephanie Acre, M.D.  Billy Fischer, M.D  09/25/2015   Critical Care Attestation.  I have personally obtained a history, examined the patient, evaluated laboratory and imaging results, formulated the assessment and plan and placed orders. The Patient requires high complexity decision making for assessment and support, frequent evaluation and titration of therapies, application of advanced monitoring technologies and extensive interpretation of multiple databases. The patient has critical illness that could lead imminently to failure of 1 or more organ systems and requires the highest level of physician preparedness to intervene.  Critical Care Time devoted to patient care services described in this note is 45 minutes and is exclusive of time spent in procedures.

## 2015-09-25 NOTE — Progress Notes (Signed)
Admitted with V tech- Cardiology re-adjusted setting on AICD and amiodarone dose, now monitoring on tele. transferrd out of ICU to hospitalist care.

## 2015-09-25 NOTE — Progress Notes (Signed)
Pt accepted in transfer from [facility]. Report received from nia. Pt is a/o and painfree. Tele shows paced rhythm. Amiodarone gtt infusing well. Hep gtt infusing well. Wife at bedside.

## 2015-09-25 NOTE — Progress Notes (Signed)
Report given to Darl Pikes, RN on 2A

## 2015-09-25 NOTE — Progress Notes (Signed)
Reviewed tracings   Spoke with Bsx  Will reprogram device to lower VT detection to 145 with ATP x 8 x 12 beats Three zones [id number]  conintue amio infusion overnight and if no more VtT ok to home tomorrow

## 2015-09-25 NOTE — Progress Notes (Signed)
ANTICOAGULATION CONSULT NOTE - Initial Consult  Pharmacy Consult for Heparin Indication: chest pain/ACS  No Known Allergies  Patient Measurements: Height: 5\' 8"  (172.7 cm) Weight: 174 lb 2.6 oz (79 kg) IBW/kg (Calculated) : 68.4 Heparin Dosing Weight: 76.5 kg  Vital Signs: Temp: 97.6 F (36.4 C) (09/08 1217) Temp Source: Axillary (09/08 0800) BP: 114/70 (09/08 1217) Pulse Rate: 68 (09/08 1217)  Labs:  Recent Labs  09/24/15 1927 09/25/15 0555 09/25/15 0900 09/25/15 1414  HGB 11.1* 11.5*  --   --   HCT 33.2* 33.9*  --   --   PLT 120* 109*  --   --   APTT 35  --   --   --   LABPROT 18.4*  --   --   --   INR 1.51  --   --   --   HEPARINUNFRC  --  0.59  --  0.63  CREATININE 1.77* 2.05*  --   --   TROPONINI 0.05*  --  1.18*  --     Estimated Creatinine Clearance: 27.8 mL/min (by C-G formula based on SCr of 2.05 mg/dL).   Assessment: 82 y/o M with a known h/o CHF and CAD started on heparin drip for ACS. Patient was not on anticoagulants per family.   Goal of Therapy:  Heparin level 0.3-0.7 units/ml Monitor platelets by anticoagulation protocol: Yes   Plan:  Give 4000 units bolus x 1 Start heparin infusion at 900 units/hr Check anti-Xa level in 8 hours and daily while on heparin Continue to monitor H&H and platelets   9/8 confirmatory heparin level = 0.63. Continue heparin at current rate of 900 units/hr and will check CBC/HL with AM labs tomorrow.  11/8, PharmD, BCPS Clinical Pharmacist 09/25/2015,3:01 PM

## 2015-09-25 NOTE — Progress Notes (Signed)
[facility]  Patient Status: Active  [facility] is providing the following services: SN/PT/HHA  If patient discharges after hours, please call [phone].   Tom Nguyen 09/25/2015, 11:39 AM

## 2015-09-25 NOTE — Progress Notes (Signed)
Dr. Mariah Milling notified of troponin of 1.18. States to continue heparin and would like to recheck troponin in 8hrs.

## 2015-09-25 NOTE — Progress Notes (Signed)
ANTICOAGULATION CONSULT NOTE - Initial Consult  Pharmacy Consult for Heparin Indication: chest pain/ACS  No Known Allergies  Patient Measurements: Height: 5\' 8"  (172.7 cm) Weight: 174 lb 2.6 oz (79 kg) IBW/kg (Calculated) : 68.4 Heparin Dosing Weight: 76.5 kg  Vital Signs: Temp: 98.3 F (36.8 C) (09/08 0400) Temp Source: Oral (09/07 2200) BP: 93/76 (09/08 0400) Pulse Rate: 73 (09/08 0400)  Labs:  Recent Labs  09/24/15 1927 09/25/15 0555  HGB 11.1* 11.5*  HCT 33.2* 33.9*  PLT 120* 109*  APTT 35  --   LABPROT 18.4*  --   INR 1.51  --   HEPARINUNFRC  --  0.59  CREATININE 1.77* 2.05*  TROPONINI 0.05*  --     Estimated Creatinine Clearance: 27.8 mL/min (by C-G formula based on SCr of 2.05 mg/dL).   Medical History: Past Medical History:  Diagnosis Date  . Arthritis   . Asthma   . CAD (coronary artery disease)    a. s/p CABG 1995; b. stress test 2006: inf post lat infarction w/ peri-infarct ischemia, AK inferior wall & HK inferolat wall, EF 28%. unchanged from prior study 07/2002.  08/2002 Chronic combined systolic and diastolic CHF, NYHA class 3 (HCC)    a. 2007 EF 40%; b. 10/2008: EF 35% by echo; c. echo 10/16: EF 45-50%, sev inf/post wall HK, conduction abnormality, mild MR, mod dilated LA, PASP 47 mm Hg  . Chronic diarrhea   . Chronic respiratory failure (HCC)   . COPD (chronic obstructive pulmonary disease) (HCC)    on 2L o2 at home  . Emphysema of lung (HCC)   . History of GI bleed   . Hyperlipidemia   . Hypertension   . Hypothyroid   . Ischemic cardiomyopathy   . LBBB (left bundle branch block)   . MI (myocardial infarction) (HCC) 1995  . Sleep apnea    a. on CPAP    Medications:  Prescriptions Prior to Admission  Medication Sig Dispense Refill Last Dose  . acetaminophen (TYLENOL) 500 MG tablet Take 500 mg by mouth every 6 (six) hours as needed.   prn at prn  . albuterol (PROVENTIL HFA;VENTOLIN HFA) 108 (90 Base) MCG/ACT inhaler Inhale 2 puffs into the  lungs every 4 (four) hours as needed for wheezing or shortness of breath. 1 Inhaler 6 09/24/2015 at 1000  . alendronate (FOSAMAX) 70 MG tablet Take 70 mg by mouth once a week.    Past Week at Unknown time  . allopurinol (ZYLOPRIM) 100 MG tablet Take 100 mg by mouth daily.    09/24/2015 at 1000  . amiodarone (PACERONE) 200 MG tablet Take 1 tablet (200 mg total) by mouth daily. 90 tablet 3 09/24/2015 at 1000  . carvedilol (COREG) 3.125 MG tablet Take 1 tablet (3.125 mg total) by mouth 2 (two) times daily with a meal. 180 tablet 3 09/24/2015 at 1000  . folic acid (FOLVITE) 1 MG tablet Take 1 mg by mouth daily.    09/24/2015 at 1000  . furosemide (LASIX) 40 MG tablet Take 1 tablet (40 mg total) by mouth 2 (two) times daily. 180 tablet 0 09/24/2015 at 1000  . hydroxychloroquine (PLAQUENIL) 200 MG tablet Take 400 mg by mouth daily.    09/23/2015 at pm  . leflunomide (ARAVA) 20 MG tablet Take 20 mg by mouth daily.    09/24/2015 at 1000  . levothyroxine (SYNTHROID, LEVOTHROID) 75 MCG tablet Take 1 tablet (75 mcg total) by mouth daily before breakfast. 90 tablet 2 09/24/2015 at 1000  .  montelukast (SINGULAIR) 10 MG tablet Take 10 mg by mouth daily.    09/23/2015 at pm  . Multiple Vitamins-Minerals (MULTIVITAMIN ADULT PO) Take 1 tablet by mouth daily.    09/24/2015 at 1000  . nitroGLYCERIN (NITROLINGUAL) 0.4 MG/SPRAY spray Place 1 spray under the tongue every 5 (five) minutes x 3 doses as needed for chest pain.   prn at prn  . omeprazole (PRILOSEC) 20 MG capsule Take 1 capsule (20 mg total) by mouth daily. 90 capsule 0 09/24/2015 at 1000  . Potassium Chloride ER 20 MEQ TBCR Take 20 mEq by mouth daily. 90 tablet 0 09/24/2015 at 1000  . sacubitril-valsartan (ENTRESTO) 24-26 MG Take 1 tablet by mouth 2 (two) times daily. 60 tablet 6 09/24/2015 at 1000  . simvastatin (ZOCOR) 40 MG tablet Take 1 tablet (40 mg total) by mouth daily. 90 tablet 0 09/24/2015 at 1000  . umeclidinium-vilanterol (ANORO ELLIPTA) 62.5-25 MCG/INH AEPB Inhale 1 puff into  the lungs daily. 180 each 3 09/24/2015 at 1000  . albuterol (PROVENTIL) (2.5 MG/3ML) 0.083% nebulizer solution Take 3 mLs (2.5 mg total) by nebulization every 4 (four) hours. For wheezing/SOB (Patient not taking: Reported on 09/24/2015) 120 mL 12 Not Taking at Unknown time   Scheduled:  . carvedilol  3.125 mg Oral BID WC  . famotidine (PEPCID) IV  20 mg Intravenous Q12H  . levothyroxine  75 mcg Oral QAC breakfast  . montelukast  10 mg Oral QHS  . pneumococcal 23 valent vaccine  0.5 mL Intramuscular Tomorrow-1000  . simvastatin  40 mg Oral q1800  . sodium chloride flush  3 mL Intravenous Q12H    Assessment: 82 y/o M with a known h/o CHF and CAD not on anticoagulants per family.   Goal of Therapy:  Heparin level 0.3-0.7 units/ml Monitor platelets by anticoagulation protocol: Yes   Plan:  Give 4000 units bolus x 1 Start heparin infusion at 900 units/hr Check anti-Xa level in 8 hours and daily while on heparin Continue to monitor H&H and platelets   9/8 AM heparin level 0.59. Recheck in 8 hours to confirm.  Mckaylah Bettendorf S 09/25/2015,6:50 AM

## 2015-09-25 NOTE — Progress Notes (Signed)
Key Points: Use following P&T approved IV to PO antibiotic change policy.  Description contains the criteria that are approved Note: Policy Excludes:  Esophagectomy patientsPHARMACIST - PHYSICIAN COMMUNICATION DR:   Nicholos Johns CONCERNING: IV to Oral Route Change Policy  RECOMMENDATION: This patient is receiving famotidine by the intravenous route.  Based on criteria approved by the Pharmacy and Therapeutics Committee, the intravenous medication(s) is/are being converted to the equivalent oral dose form(s).   DESCRIPTION: These criteria include:  The patient is eating (either orally or via tube) and/or has been taking other orally administered medications for a least 24 hours  The patient has no evidence of active gastrointestinal bleeding or impaired GI absorption (gastrectomy, short bowel, patient on TNA or NPO).  If you have questions about this conversion, please contact the Pharmacy Department    Cindi Carbon, [facility] 09/25/2015 11:06 AM

## 2015-09-26 LAB — CBC
HEMATOCRIT: 33.1 % — AB (ref 40.0–52.0)
Hemoglobin: 11.5 g/dL — ABNORMAL LOW (ref 13.0–18.0)
MCH: 32.2 pg (ref 26.0–34.0)
MCHC: 34.7 g/dL (ref 32.0–36.0)
MCV: 92.9 fL (ref 80.0–100.0)
PLATELETS: 101 10*3/uL — AB (ref 150–440)
RBC: 3.56 MIL/uL — ABNORMAL LOW (ref 4.40–5.90)
RDW: 19.6 % — AB (ref 11.5–14.5)
WBC: 8.1 10*3/uL (ref 3.8–10.6)

## 2015-09-26 LAB — PHOSPHORUS: PHOSPHORUS: 4 mg/dL (ref 2.5–4.6)

## 2015-09-26 LAB — BASIC METABOLIC PANEL
ANION GAP: 13 (ref 5–15)
BUN: 41 mg/dL — ABNORMAL HIGH (ref 6–20)
CALCIUM: 8.1 mg/dL — AB (ref 8.9–10.3)
CO2: 19 mmol/L — ABNORMAL LOW (ref 22–32)
CREATININE: 2.73 mg/dL — AB (ref 0.61–1.24)
Chloride: 101 mmol/L (ref 101–111)
GFR, EST AFRICAN AMERICAN: 24 mL/min — AB (ref >60)
GFR, EST NON AFRICAN AMERICAN: 20 mL/min — AB (ref >60)
Glucose, Bld: 101 mg/dL — ABNORMAL HIGH (ref 65–99)
Potassium: 4.4 mmol/L (ref 3.5–5.1)
SODIUM: 133 mmol/L — AB (ref 135–145)

## 2015-09-26 LAB — HEPARIN LEVEL (UNFRACTIONATED)

## 2015-09-26 LAB — MAGNESIUM: Magnesium: 2.1 mg/dL (ref 1.7–2.4)

## 2015-09-26 LAB — CK: CK TOTAL: 58 U/L (ref 49–397)

## 2015-09-26 MED ORDER — SODIUM CHLORIDE 0.9 % IV SOLN
INTRAVENOUS | Status: DC
  Administered 2015-09-26: 11:00:00 via INTRAVENOUS

## 2015-09-26 MED ORDER — AMIODARONE HCL 200 MG PO TABS
400.0000 mg | ORAL_TABLET | Freq: Two times a day (BID) | ORAL | Status: DC
  Administered 2015-09-26 – 2015-09-30 (×8): 400 mg via ORAL
  Filled 2015-09-26 (×8): qty 2

## 2015-09-26 NOTE — Discharge Instructions (Signed)
Resume diet and activity as before ° ° °

## 2015-09-26 NOTE — Progress Notes (Signed)
[facility] - [place] [facility]   PATIENT NAME: Cary Lothrop    MR#:  [id number]  DATE OF BIRTH:  Sep 09, 1932  SUBJECTIVE:  CHIEF COMPLAINT:   Chief Complaint  Patient presents with  . Chest Pain   No chest pain or shortness of breath. Had some blood in stool which is chronic from his hemorrhoids. Now resolved.  REVIEW OF SYSTEMS:    Review of Systems  Constitutional: Negative for chills and fever.  HENT: Negative for sore throat.   Eyes: Negative for blurred vision, double vision and pain.  Respiratory: Negative for cough, hemoptysis, shortness of breath and wheezing.   Cardiovascular: Negative for chest pain, palpitations, orthopnea and leg swelling.  Gastrointestinal: Negative for abdominal pain, constipation, diarrhea, heartburn, nausea and vomiting.  Genitourinary: Negative for dysuria and hematuria.  Musculoskeletal: Negative for back pain and joint pain.  Skin: Negative for rash.  Neurological: Negative for sensory change, speech change, focal weakness and headaches.  Endo/Heme/Allergies: Does not bruise/bleed easily.  Psychiatric/Behavioral: Negative for depression. The patient is not nervous/anxious.     DRUG ALLERGIES:  No Known Allergies  VITALS:  Blood pressure 101/65, pulse 63, temperature 98.2 F (36.8 C), temperature source Axillary, resp. rate 16, height 5\' 8"  (1.727 m), weight 79.5 kg (175 lb 4.8 oz), SpO2 99 %.  PHYSICAL EXAMINATION:   Physical Exam  GENERAL:  82 y.o.-year-old patient lying in the bed with no acute distress.  EYES: Pupils equal, round, reactive to light and accommodation. No scleral icterus. Extraocular muscles intact.  HEENT: Head atraumatic, normocephalic. Oropharynx and nasopharynx clear.  NECK:  Supple, no jugular venous distention. No thyroid enlargement, no tenderness.  LUNGS: Normal breath sounds bilaterally, no wheezing, rales, rhonchi. No use of accessory muscles of respiration.  CARDIOVASCULAR: S1,  S2 normal. No murmurs, rubs, or gallops.  ABDOMEN: Soft, nontender, nondistended. Bowel sounds present. No organomegaly or mass.  EXTREMITIES: No cyanosis, clubbing or edema b/l.    NEUROLOGIC: Cranial nerves II through XII are intact. No focal Motor or sensory deficits b/l.   PSYCHIATRIC: The patient is alert and oriented x 3.  SKIN: No obvious rash, lesion, or ulcer.   LABORATORY PANEL:   CBC  Recent Labs Lab 09/26/15 0449  WBC 8.1  HGB 11.5*  HCT 33.1*  PLT 101*   ------------------------------------------------------------------------------------------------------------------ Chemistries   Recent Labs Lab 09/24/15 1927  09/26/15 0449  NA 136  < > 133*  K 4.2  < > 4.4  CL 101  < > 101  CO2 21*  < > 19*  GLUCOSE 133*  < > 101*  BUN 32*  < > 41*  CREATININE 1.77*  < > 2.73*  CALCIUM 8.9  < > 8.1*  MG  --   < > 2.1  AST 42*  --   --   ALT 26  --   --   ALKPHOS 97  --   --   BILITOT 1.6*  --   --   < > = values in this interval not displayed. ------------------------------------------------------------------------------------------------------------------  Cardiac Enzymes  Recent Labs Lab 09/25/15 1722  TROPONINI 0.89*   ------------------------------------------------------------------------------------------------------------------  RADIOLOGY:  Dg Chest Port 1 View  Result Date: 09/25/2015 CLINICAL DATA:  82 year old male with shortness of breath. EXAM: PORTABLE CHEST 1 VIEW COMPARISON:  Chest radiograph dated 09/24/2015 FINDINGS: There is stable moderate cardiomegaly with minimal central vascular prominence. There is no focal consolidation, pleural effusion, or pneumothorax. Right apical pleural thickening. Left pectoral AICD device and median  sternotomy wires noted. There is osteopenia with degenerative changes of the spine. Bilateral shoulder arthroplasty. No acute fracture. IMPRESSION: Cardiomegaly with probable mild congestive changes. No focal consolidation  or edema. Electronically Signed   By: Elgie Collard M.D.   On: 09/25/2015 02:59   Dg Chest Port 1 View  Result Date: 09/24/2015 CLINICAL DATA:  Dyspnea EXAM: PORTABLE CHEST 1 VIEW COMPARISON:  08/12/2015 FINDINGS: Cardiomegaly again noted. Status post median sternotomy. 3 leads cardiac pacemaker is unchanged in position. No infiltrate or pulmonary edema. Bilateral shoulder prosthesis again noted. IMPRESSION: Cardiomegaly. No active disease. Cardiac pacemaker unchanged in position. Electronically Signed   By: Natasha Mead M.D.   On: 09/24/2015 20:24     ASSESSMENT AND PLAN:   * Sustained VT - s/p cardioversion On amiodarone drip Seen by Cardiology/EP. Appreciate help Improved.  * AKI over CKD Check CK Hold lasix Start IVF Consult nephrology Monitor I/Os  * CAD Elevated troponin likely due to demand ischemia being in ventricular tachycardia and status post cardioversion.  *Hypothyroidism On levothyroxine  * Chronic respiratory failure with COPD Stable   All the records are reviewed and case discussed with Care Management/Social Workerr. Management plans discussed with the patient, family and they are in agreement.  CODE STATUS: FULL CODE  DVT Prophylaxis: SCDs  TOTAL TIME TAKING CARE OF THIS PATIENT: 30 minutes.   POSSIBLE D/C IN 1-2 DAYS, DEPENDING ON CLINICAL CONDITION.  Milagros Loll R M.D on 09/26/2015 at 9:22 AM  Between 7am to 6pm - Pager - [phone]  After 6pm go to [email or web address] - password [email or web address]  [facility]  Office  [phone]  CC: Primary care physician; Olevia Perches, DO  Note: This dictation was prepared with Dragon dictation along with smaller phrase technology. Any transcriptional errors that result from this process are unintentional.

## 2015-09-26 NOTE — Progress Notes (Signed)
Pt remains a/o but is somewhat discouraged. Up to chair and tol well. Has been voiding in small amts all day. Bladder scan show residual 130 mls. Amiodarone gtt and ivf's d/c'd. Po amio initiated. Pt c/o feeling short of breath. sats on r/ 92%, but placed on 2 l for comfort. (uses 02 with bipap at noc.)

## 2015-09-26 NOTE — Progress Notes (Signed)
ANTICOAGULATION CONSULT NOTE - Initial Consult  Pharmacy Consult for Heparin Indication: chest pain/ACS  No Known Allergies  Patient Measurements: Height: 5\' 8"  (172.7 cm) Weight: 175 lb 4.8 oz (79.5 kg) IBW/kg (Calculated) : 68.4 Heparin Dosing Weight: 76.5 kg  Vital Signs: Temp: 98.2 F (36.8 C) (09/09 0335) Temp Source: Axillary (09/09 0335) BP: 101/65 (09/09 0335) Pulse Rate: 63 (09/09 0335)  Labs:  Recent Labs  09/24/15 1927 09/25/15 0555 09/25/15 0900 09/25/15 1414 09/25/15 1722 09/26/15 0449  HGB 11.1* 11.5*  --   --   --  11.5*  HCT 33.2* 33.9*  --   --   --  33.1*  PLT 120* 109*  --   --   --  101*  APTT 35  --   --   --   --   --   LABPROT 18.4*  --   --   --   --   --   INR 1.51  --   --   --   --   --   HEPARINUNFRC  --  0.59  --  0.63  --  <0.10*  CREATININE 1.77* 2.05*  --   --   --  2.73*  TROPONINI 0.05*  --  1.18*  --  0.89*  --     Estimated Creatinine Clearance: 20.9 mL/min (by C-G formula based on SCr of 2.73 mg/dL).   Assessment: 82 y/o M with a known h/o CHF and CAD started on heparin drip for ACS. Patient was not on anticoagulants per family.   Goal of Therapy:  Heparin level 0.3-0.7 units/ml Monitor platelets by anticoagulation protocol: Yes   Plan:  Give 4000 units bolus x 1 Start heparin infusion at 900 units/hr Check anti-Xa level in 8 hours and daily while on heparin Continue to monitor H&H and platelets   9/8 confirmatory heparin level = 0.63. Continue heparin at current rate of 900 units/hr and will check CBC/HL with AM labs tomorrow.  9/9 AM heparin level undetectable. Per RN heparin drip has been stopped due to bleeding from multiple sites. Day shift will verify with cardiology at rounding whether to restart or d/c. No modifications to regimen at this time.  Mikiala Fugett S, PharmD, BCPS Clinical Pharmacist 09/26/2015,5:55 AM

## 2015-09-26 NOTE — Progress Notes (Signed)
[facility]  ROUNDING NOTE   Subjective:  Patient well known to [place] from last admission. He presented this time with ventricular tachycardia. He underwent defibrillation and then was subsequent restarted on amiodarone. He currently remains on an amiodarone drip. Renal function has worsened since admission. Baseline creatinine is 1.4. Creatinine currently up to 2.7. Patient also has pretty severe underlying cardiomyopathy with ejection fraction 25-30%. Lasix currently on hold.   Objective:  Vital signs in last 24 hours:  Temp:  [97.5 F (36.4 C)-98.4 F (36.9 C)] 98.4 F (36.9 C) (09/09 0849) Pulse Rate:  [63-67] 64 (09/09 1112) Resp:  [16-18] 18 (09/09 1112) BP: (98-111)/(61-74) 99/61 (09/09 1112) SpO2:  [96 %-100 %] 100 % (09/09 1112) Weight:  [79.5 kg (175 lb 4.8 oz)] 79.5 kg (175 lb 4.8 oz) (09/09 0331)  Weight change: 3.311 kg (7 lb 4.8 oz) Filed Weights   09/24/15 2038 09/25/15 0325 09/26/15 0331  Weight: 76.2 kg (168 lb) 79 kg (174 lb 2.6 oz) 79.5 kg (175 lb 4.8 oz)    Intake/Output: I/O last 3 completed shifts: In: 977.3 [P.O.:240; I.V.:687.3; IV Piggyback:50] Out: 150 [Urine:150]   Intake/Output this shift:  Total I/O In: 240 [P.O.:240] Out: 75 [Urine:75]  Physical Exam: General: No acute distress  Head: Normocephalic, atraumatic. Moist oral mucosal membranes  Eyes: Anicteric  Neck: Supple, trachea midline  Lungs:  Basilar rales, normal effort  Heart: S1S2 no rubs  Abdomen:  Soft, nontender, BS present  Extremities: 1+ peripheral edema.  Neurologic: Nonfocal, moving all four extremities  Skin: No lesions  Access:     Basic Metabolic Panel:  Recent Labs Lab 09/24/15 1927 09/25/15 0555 09/26/15 0449  NA 136 134* 133*  K 4.2 4.3 4.4  CL 101 103 101  CO2 21* 20* 19*  GLUCOSE 133* 125* 101*  BUN 32* 33* 41*  CREATININE 1.77* 2.05* 2.73*  CALCIUM 8.9 8.2* 8.1*  MG  --  2.2 2.1  PHOS  --  3.5 4.0    Liver Function  Tests:  Recent Labs Lab 09/24/15 1927  AST 42*  ALT 26  ALKPHOS 97  BILITOT 1.6*  PROT 6.5  ALBUMIN 3.6   No results for input(s): LIPASE, AMYLASE in the last 168 hours. No results for input(s): AMMONIA in the last 168 hours.  CBC:  Recent Labs Lab 09/24/15 1927 09/25/15 0555 09/26/15 0449  WBC 7.5 8.3 8.1  HGB 11.1* 11.5* 11.5*  HCT 33.2* 33.9* 33.1*  MCV 95.6 94.7 92.9  PLT 120* 109* 101*    Cardiac Enzymes:  Recent Labs Lab 09/24/15 1927 09/25/15 0900 09/25/15 1722 09/26/15 0449  CKTOTAL  --   --   --  58  TROPONINI 0.05* 1.18* 0.89*  --     BNP: Invalid input(s): POCBNP  CBG:  Recent Labs Lab 09/24/15 2226  GLUCAP 142*    Microbiology: Results for orders placed or performed during the hospital encounter of 09/24/15  MRSA PCR Screening     Status: None   Collection Time: 09/24/15 10:26 PM  Result Value Ref Range Status   MRSA by PCR NEGATIVE NEGATIVE Final    Comment:        The GeneXpert MRSA Assay (FDA approved for NASAL specimens only), is one component of a comprehensive MRSA colonization surveillance program. It is not intended to diagnose MRSA infection nor to guide or monitor treatment for MRSA infections.     Coagulation Studies:  Recent Labs  09/24/15 1927  LABPROT 18.4*  INR 1.51  Urinalysis: No results for input(s): COLORURINE, LABSPEC, PHURINE, GLUCOSEU, HGBUR, BILIRUBINUR, KETONESUR, PROTEINUR, UROBILINOGEN, NITRITE, LEUKOCYTESUR in the last 72 hours.  Invalid input(s): APPERANCEUR    Imaging: Dg Chest Port 1 View  Result Date: 09/25/2015 CLINICAL DATA:  81 year old male with shortness of breath. EXAM: PORTABLE CHEST 1 VIEW COMPARISON:  Chest radiograph dated 09/24/2015 FINDINGS: There is stable moderate cardiomegaly with minimal central vascular prominence. There is no focal consolidation, pleural effusion, or pneumothorax. Right apical pleural thickening. Left pectoral AICD device and median sternotomy wires  noted. There is osteopenia with degenerative changes of the spine. Bilateral shoulder arthroplasty. No acute fracture. IMPRESSION: Cardiomegaly with probable mild congestive changes. No focal consolidation or edema. Electronically Signed   By: Elgie Collard M.D.   On: 09/25/2015 02:59   Dg Chest Port 1 View  Result Date: 09/24/2015 CLINICAL DATA:  Dyspnea EXAM: PORTABLE CHEST 1 VIEW COMPARISON:  08/12/2015 FINDINGS: Cardiomegaly again noted. Status post median sternotomy. 3 leads cardiac pacemaker is unchanged in position. No infiltrate or pulmonary edema. Bilateral shoulder prosthesis again noted. IMPRESSION: Cardiomegaly. No active disease. Cardiac pacemaker unchanged in position. Electronically Signed   By: Natasha Mead M.D.   On: 09/24/2015 20:24     Medications:     . amiodarone  400 mg Oral BID  . carvedilol  3.125 mg Oral BID WC  . clopidogrel  75 mg Oral Daily  . famotidine  20 mg Oral Daily  . levothyroxine  75 mcg Oral QAC breakfast  . montelukast  10 mg Oral QHS  . simvastatin  40 mg Oral q1800  . sodium chloride flush  3 mL Intravenous Q12H   sodium chloride, sodium chloride, acetaminophen, ipratropium-albuterol, morphine injection, ondansetron (ZOFRAN) IV, sodium chloride flush  Assessment/ Plan:  82 y.o. male with medical problems of COPD requiring home oxygen, rheumatoid arthritis, hypertension, severe coronary disease with five-vessel CABG in 1995, congestive heart failure with EF 25-30%, pacemaker, AICD, history of goiter, history of renal stones in the remote past, CKD stage III, admitted with ventricular tachycardia and placed on amiodarone  1. Acute renal failure/chronic kidney disease stage III Baseline creatinine 1.4. The patient has worsening renal function now. Agree with cardiology that this most likely represents renal dysfunction secondary to acute cardiorenal syndrome. He is felt to be volume overloaded at this time therefore IV fluids will not be administered. In  addition given worsening renal function Lasix has also been placed on hold. We will obtain renal ultrasound to make sure there is no underlying obstruction. Continue to monitor renal function daily.  2.  Hyponatremia. Likely related to volume overload. Serum sodium currently 133. Hold off on IV fluids.  3. Acute systolic heart failure. Ejection fraction 25-30%. Cardiology following closely. Lasix has been placed on hold.  4. Ventricular tachycardia. Patient has been transitioned to by mouth amiodarone.  5. Anemia of chronic kidney disease. Hemoglobin currently 11.5. No indication for Procrit.   LOS: 2 Tom Nguyen 9/9/201712:31 PM

## 2015-09-26 NOTE — Progress Notes (Signed)
Patient ID: Tom Nguyen, male   DOB: 1932-09-29, 82 y.o.   MRN: [id number]   SUBJECTIVE: Telemetry overnight showed 1 episodes of 5 beats NSVT, otherwise no events.  No dyspnea at rest.  Creatinine worse today.   Scheduled Meds: . amiodarone  400 mg Oral BID  . carvedilol  3.125 mg Oral BID WC  . clopidogrel  75 mg Oral Daily  . famotidine  20 mg Oral Daily  . levothyroxine  75 mcg Oral QAC breakfast  . montelukast  10 mg Oral QHS  . simvastatin  40 mg Oral q1800  . sodium chloride flush  3 mL Intravenous Q12H   Continuous Infusions:  PRN Meds:.sodium chloride, sodium chloride, acetaminophen, ipratropium-albuterol, morphine injection, ondansetron (ZOFRAN) IV, sodium chloride flush    Vitals:   09/26/15 0331 09/26/15 0335 09/26/15 0849 09/26/15 1112  BP:  101/65 111/74 99/61  Pulse:  63 67 64  Resp:  16 16 18   Temp:  98.2 F (36.8 C) 98.4 F (36.9 C)   TempSrc:  Axillary Oral   SpO2:  99% 96% 100%  Weight: 175 lb 4.8 oz (79.5 kg)     Height:        Intake/Output Summary (Last 24 hours) at 09/26/15 1155 Last data filed at 09/26/15 1046  Gross per 24 hour  Intake           911.85 ml  Output              225 ml  Net           686.85 ml    LABS: Basic Metabolic Panel:  Recent Labs  11/26/15 0555 09/26/15 0449  NA 134* 133*  K 4.3 4.4  CL 103 101  CO2 20* 19*  GLUCOSE 125* 101*  BUN 33* 41*  CREATININE 2.05* 2.73*  CALCIUM 8.2* 8.1*  MG 2.2 2.1  PHOS 3.5 4.0   Liver Function Tests:  Recent Labs  09/24/15 1927  AST 42*  ALT 26  ALKPHOS 97  BILITOT 1.6*  PROT 6.5  ALBUMIN 3.6   No results for input(s): LIPASE, AMYLASE in the last 72 hours. CBC:  Recent Labs  09/25/15 0555 09/26/15 0449  WBC 8.3 8.1  HGB 11.5* 11.5*  HCT 33.9* 33.1*  MCV 94.7 92.9  PLT 109* 101*   Cardiac Enzymes:  Recent Labs  09/24/15 1927 09/25/15 0900 09/25/15 1722 09/26/15 0449  CKTOTAL  --   --   --  58  TROPONINI 0.05* 1.18* 0.89*  --    BNP: Invalid  input(s): POCBNP D-Dimer: No results for input(s): DDIMER in the last 72 hours. Hemoglobin A1C: No results for input(s): HGBA1C in the last 72 hours. Fasting Lipid Panel: No results for input(s): CHOL, HDL, LDLCALC, TRIG, CHOLHDL, LDLDIRECT in the last 72 hours. Thyroid Function Tests: No results for input(s): TSH, T4TOTAL, T3FREE, THYROIDAB in the last 72 hours.  Invalid input(s): FREET3 Anemia Panel: No results for input(s): VITAMINB12, FOLATE, FERRITIN, TIBC, IRON, RETICCTPCT in the last 72 hours.  RADIOLOGY: Dg Chest Port 1 View  Result Date: 09/25/2015 CLINICAL DATA:  82 year old male with shortness of breath. EXAM: PORTABLE CHEST 1 VIEW COMPARISON:  Chest radiograph dated 09/24/2015 FINDINGS: There is stable moderate cardiomegaly with minimal central vascular prominence. There is no focal consolidation, pleural effusion, or pneumothorax. Right apical pleural thickening. Left pectoral AICD device and median sternotomy wires noted. There is osteopenia with degenerative changes of the spine. Bilateral shoulder arthroplasty. No acute fracture. IMPRESSION: Cardiomegaly with probable  mild congestive changes. No focal consolidation or edema. Electronically Signed   By: Elgie Collard M.D.   On: 09/25/2015 02:59   Dg Chest Port 1 View  Result Date: 09/24/2015 CLINICAL DATA:  Dyspnea EXAM: PORTABLE CHEST 1 VIEW COMPARISON:  08/12/2015 FINDINGS: Cardiomegaly again noted. Status post median sternotomy. 3 leads cardiac pacemaker is unchanged in position. No infiltrate or pulmonary edema. Bilateral shoulder prosthesis again noted. IMPRESSION: Cardiomegaly. No active disease. Cardiac pacemaker unchanged in position. Electronically Signed   By: Natasha Mead M.D.   On: 09/24/2015 20:24    PHYSICAL EXAM General: NAD Neck: JVP 12+ cm, no thyromegaly or thyroid nodule.  Lungs: Crackles at bases bilaterally.  CV: Lateral PMI.  Heart regular S1/S2, no S3/S4, no murmur.  1+ edema 1/2 to knees bilaterally.    Abdomen: Soft, nontender, no hepatosplenomegaly, mild distention.  Neurologic: Alert and oriented x 3.  Psych: Normal affect. Extremities: No clubbing or cyanosis.   TELEMETRY: Reviewed telemetry pt in NSR, BiV paced  ASSESSMENT AND PLAN: 82 with history of CAD s/p CABG, ischemic cardiomyopathy s/p [employer] CRT-D, paroxysmal atrial fibrillation, CKD admitted with VT and ICD discharge.   1. VT: Appropriate ICD discharge.  No further sustained VT here, reloading amiodarone via gtt. Do not think this was triggered by ACS, likely scar-mediated. - Transition to amiodarone 400 mg bid, likely home eventually on 200 mg bid.  2. CAD: s/p CABG.  Had LHC in 7/17 with no options for revascularization.  Would not repeat cath (especially with AKI). Elevated troponin may be demand ischemia with volume overload and defibrillation.  - Continue Plavix, statin.  3. Acute on chronic systolic CHF: EF 40-81% on 7/17 echo, ischemic cardiomyopathy.  RHC in 7/17 showed low cardiac output.  Difficult situation.  He is volume overloaded on exam but creatinine up to 2.73 (AKI).  I suspect he has low output heart failure with a component of cardiorenal syndrome.  This will be very difficult to manage, would try to hold off on milrinone gtt use with recent sustained VT.  SBP in 90s-100s.  - Keep off Entresto.  - Holding Lasix today with AKI but volume overloaded and will have to eventually restart.   - Would not give IV fluid, he is volume overloaded.  I will stop.  4. AKI on CKD stage III: Most likely hemodynamic.  Known low cardiac output based on RHC in 7/17.  As above, would hold Lasix for now but will soon need to restart.  Would not give IV fluid.   Marca Ancona 09/26/2015 12:02 PM

## 2015-09-27 ENCOUNTER — Inpatient Hospital Stay: Payer: Medicare Other

## 2015-09-27 LAB — BASIC METABOLIC PANEL
ANION GAP: 13 (ref 5–15)
BUN: 48 mg/dL — ABNORMAL HIGH (ref 6–20)
CHLORIDE: 99 mmol/L — AB (ref 101–111)
CO2: 19 mmol/L — ABNORMAL LOW (ref 22–32)
Calcium: 8.1 mg/dL — ABNORMAL LOW (ref 8.9–10.3)
Creatinine, Ser: 3.16 mg/dL — ABNORMAL HIGH (ref 0.61–1.24)
GFR, EST AFRICAN AMERICAN: 20 mL/min — AB (ref >60)
GFR, EST NON AFRICAN AMERICAN: 17 mL/min — AB (ref >60)
Glucose, Bld: 91 mg/dL (ref 65–99)
POTASSIUM: 4.4 mmol/L (ref 3.5–5.1)
SODIUM: 131 mmol/L — AB (ref 135–145)

## 2015-09-27 LAB — PHOSPHORUS: Phosphorus: 4.1 mg/dL (ref 2.5–4.6)

## 2015-09-27 MED ORDER — FUROSEMIDE 10 MG/ML IJ SOLN
60.0000 mg | Freq: Once | INTRAMUSCULAR | Status: AC
Start: 2015-09-27 — End: 2015-09-27
  Administered 2015-09-27: 60 mg via INTRAVENOUS
  Filled 2015-09-27: qty 6

## 2015-09-27 NOTE — Progress Notes (Signed)
Pt given 60 mg iv lasix. Only voided one unmeasured amount afterwards.

## 2015-09-27 NOTE — Progress Notes (Signed)
[facility] [place] [facility]   PATIENT NAME: Tom Nguyen    MR#:  [id number]  DATE OF BIRTH:  [birth year]  SUBJECTIVE:  CHIEF COMPLAINT:   Chief Complaint  Patient presents with  . Chest Pain   No chest pain or shortness of breath. Slept with his CPAP. Frequent urination  REVIEW OF SYSTEMS:    Review of Systems  Constitutional: Negative for chills and fever.  HENT: Negative for sore throat.   Eyes: Negative for blurred vision, double vision and pain.  Respiratory: Negative for cough, hemoptysis, shortness of breath and wheezing.   Cardiovascular: Negative for chest pain, palpitations, orthopnea and leg swelling.  Gastrointestinal: Negative for abdominal pain, constipation, diarrhea, heartburn, nausea and vomiting.  Genitourinary: Negative for dysuria and hematuria.  Musculoskeletal: Negative for back pain and joint pain.  Skin: Negative for rash.  Neurological: Negative for sensory change, speech change, focal weakness and headaches.  Endo/Heme/Allergies: Does not bruise/bleed easily.  Psychiatric/Behavioral: Negative for depression. The patient is not nervous/anxious.     DRUG ALLERGIES:  No Known Allergies  VITALS:  Blood pressure 112/70, pulse 69, temperature 97.7 F (36.5 C), temperature source Oral, resp. rate 15, height 5\' 8"  (1.727 m), weight 82.3 kg (181 lb 8 oz), SpO2 100 %.  PHYSICAL EXAMINATION:   Physical Exam  GENERAL:  82 y.o.-year-old patient lying in the bed with no acute distress.  EYES: Pupils equal, round, reactive to light and accommodation. No scleral icterus. Extraocular muscles intact.  HEENT: Head atraumatic, normocephalic. Oropharynx and nasopharynx clear.  NECK:  Supple, no jugular venous distention. No thyroid enlargement, no tenderness.  LUNGS: Normal work of breathing. Bibasilar crackles. no wheezing, rhonchi. No use of accessory muscles of respiration.  CARDIOVASCULAR: S1, S2 normal. No murmurs, rubs, or  gallops.  ABDOMEN: Soft, nontender, nondistended. Bowel sounds present. No organomegaly or mass.  EXTREMITIES: No cyanosis, clubbing or edema b/l.    NEUROLOGIC: Cranial nerves II through XII are intact. No focal Motor or sensory deficits b/l.   PSYCHIATRIC: The patient is alert and oriented x 3.  SKIN: No obvious rash, lesion, or ulcer.   LABORATORY PANEL:   CBC  Recent Labs Lab 09/26/15 0449  WBC 8.1  HGB 11.5*  HCT 33.1*  PLT 101*   ------------------------------------------------------------------------------------------------------------------ Chemistries   Recent Labs Lab 09/24/15 1927  09/26/15 0449 09/27/15 0702  NA 136  < > 133* 131*  K 4.2  < > 4.4 4.4  CL 101  < > 101 99*  CO2 21*  < > 19* 19*  GLUCOSE 133*  < > 101* 91  BUN 32*  < > 41* 48*  CREATININE 1.77*  < > 2.73* 3.16*  CALCIUM 8.9  < > 8.1* 8.1*  MG  --   < > 2.1  --   AST 42*  --   --   --   ALT 26  --   --   --   ALKPHOS 97  --   --   --   BILITOT 1.6*  --   --   --   < > = values in this interval not displayed. ------------------------------------------------------------------------------------------------------------------  Cardiac Enzymes  Recent Labs Lab 09/25/15 1722  TROPONINI 0.89*   ------------------------------------------------------------------------------------------------------------------  RADIOLOGY:  No results found.   ASSESSMENT AND PLAN:   * AKI over CKD - worsening Likely cardiorenal syndrome. Held lasix. Monitor I/Os. Check renal ultrasound. Repeat labs in the morning.  * Sustained VT - s/p cardioversion A meter  dose increased. Stable Seen by Cardiology/EP. Appreciate help.  * CAD Elevated troponin likely due to demand ischemia being in ventricular tachycardia and status post cardioversion.  *Hypothyroidism On levothyroxine  * Chronic respiratory failure with COPD Stable  All the records are reviewed and case discussed with Care Management/Social  Workerr. Management plans discussed with the patient, family and they are in agreement.  CODE STATUS: FULL CODE  DVT Prophylaxis: SCDs  TOTAL TIME TAKING CARE OF THIS PATIENT: 30 minutes.   POSSIBLE D/C IN 1-2 DAYS, DEPENDING ON CLINICAL CONDITION.  Milagros Loll R M.D on 09/27/2015 at 8:36 AM  Between 7am to 6pm - Pager - [phone]  After 6pm go to [email or web address] - password [email or web address]  [facility]  Office  [phone]  CC: Primary care physician; Olevia Perches, DO  Note: This dictation was prepared with Dragon dictation along with smaller phrase technology. Any transcriptional errors that result from this process are unintentional.

## 2015-09-27 NOTE — Progress Notes (Signed)
[facility]  ROUNDING NOTE   Subjective:  Cardiology note reviewed. Patient being administered Lasix 60 mg IV 1 today. Renal function appears to be worse. Urine output in the oliguric range. We discussed the potential for temporary dialysis with the patient today.   Objective:  Vital signs in last 24 hours:  Temp:  [97.4 F (36.3 C)-97.7 F (36.5 C)] 97.5 F (36.4 C) (09/10 0900) Pulse Rate:  [60-69] 63 (09/10 1130) Resp:  [15-16] 16 (09/10 0900) BP: (93-112)/(55-70) 93/55 (09/10 1130) SpO2:  [97 %-100 %] 97 % (09/10 0900) Weight:  [82.3 kg (181 lb 8 oz)] 82.3 kg (181 lb 8 oz) (09/10 0549)  Weight change: 2.812 kg (6 lb 3.2 oz) Filed Weights   09/25/15 0325 09/26/15 0331 09/27/15 0549  Weight: 79 kg (174 lb 2.6 oz) 79.5 kg (175 lb 4.8 oz) 82.3 kg (181 lb 8 oz)    Intake/Output: I/O last 3 completed shifts: In: 836.6 [P.O.:720; I.V.:116.6] Out: 500 [Urine:500]   Intake/Output this shift:  No intake/output data recorded.  Physical Exam: General: No acute distress  Head: Normocephalic, atraumatic. Moist oral mucosal membranes  Eyes: Anicteric  Neck: Supple, trachea midline  Lungs:  Basilar rales, normal effort  Heart: S1S2 no rubs  Abdomen:  Soft, nontender, BS present  Extremities: 2+ peripheral edema.  Neurologic: Nonfocal, moving all four extremities  Skin: No lesions  Access:     Basic Metabolic Panel:  Recent Labs Lab 09/24/15 1927 09/25/15 0555 09/26/15 0449 09/27/15 0702  NA 136 134* 133* 131*  K 4.2 4.3 4.4 4.4  CL 101 103 101 99*  CO2 21* 20* 19* 19*  GLUCOSE 133* 125* 101* 91  BUN 32* 33* 41* 48*  CREATININE 1.77* 2.05* 2.73* 3.16*  CALCIUM 8.9 8.2* 8.1* 8.1*  MG  --  2.2 2.1  --   PHOS  --  3.5 4.0 4.1    Liver Function Tests:  Recent Labs Lab 09/24/15 1927  AST 42*  ALT 26  ALKPHOS 97  BILITOT 1.6*  PROT 6.5  ALBUMIN 3.6   No results for input(s): LIPASE, AMYLASE in the last 168 hours. No results for input(s):  AMMONIA in the last 168 hours.  CBC:  Recent Labs Lab 09/24/15 1927 09/25/15 0555 09/26/15 0449  WBC 7.5 8.3 8.1  HGB 11.1* 11.5* 11.5*  HCT 33.2* 33.9* 33.1*  MCV 95.6 94.7 92.9  PLT 120* 109* 101*    Cardiac Enzymes:  Recent Labs Lab 09/24/15 1927 09/25/15 0900 09/25/15 1722 09/26/15 0449  CKTOTAL  --   --   --  58  TROPONINI 0.05* 1.18* 0.89*  --     BNP: Invalid input(s): POCBNP  CBG:  Recent Labs Lab 09/24/15 2226  GLUCAP 142*    Microbiology: Results for orders placed or performed during the hospital encounter of 09/24/15  MRSA PCR Screening     Status: None   Collection Time: 09/24/15 10:26 PM  Result Value Ref Range Status   MRSA by PCR NEGATIVE NEGATIVE Final    Comment:        The GeneXpert MRSA Assay (FDA approved for NASAL specimens only), is one component of a comprehensive MRSA colonization surveillance program. It is not intended to diagnose MRSA infection nor to guide or monitor treatment for MRSA infections.     Coagulation Studies:  Recent Labs  09/24/15 1927  LABPROT 18.4*  INR 1.51    Urinalysis: No results for input(s): COLORURINE, LABSPEC, PHURINE, GLUCOSEU, HGBUR, BILIRUBINUR, KETONESUR, PROTEINUR, UROBILINOGEN,  NITRITE, LEUKOCYTESUR in the last 72 hours.  Invalid input(s): APPERANCEUR    Imaging: US Renal  Result Date: 09/27/2015 CLINICAL DATA:  Acute renal failure. EXAM: RENAL / URINARY TRACT ULTRASOUND COMPLETE COMPARISON:  08/07/2015 FINDINGS: Right Kidney: Length: 9.9 cm. Cortical thinning noted with slight increased cortical echogenicity. No hydronephrosis. Left Kidney: Length: 11.5 cm multiple simple appearing cysts are identified, as before. No hydronephrosis. Echogenicity within normal limits. No mass or hydronephrosis visualized. Bladder: Appears normal for degree of bladder distention. Other:  Small volume ascites noted with 2.8 cm hepatic cyst evident. IMPRESSION: No evidence for hydronephrosis.  Electronically Signed   By: Kennith Center M.D.   On: 09/27/2015 10:30     Medications:     . amiodarone  400 mg Oral BID  . carvedilol  3.125 mg Oral BID WC  . clopidogrel  75 mg Oral Daily  . famotidine  20 mg Oral Daily  . furosemide  60 mg Intravenous Once  . levothyroxine  75 mcg Oral QAC breakfast  . montelukast  10 mg Oral QHS  . simvastatin  40 mg Oral q1800  . sodium chloride flush  3 mL Intravenous Q12H   sodium chloride, sodium chloride, acetaminophen, ipratropium-albuterol, morphine injection, ondansetron (ZOFRAN) IV, sodium chloride flush  Assessment/ Plan:  82 y.o. male with medical problems of COPD requiring home oxygen, rheumatoid arthritis, hypertension, severe coronary disease with five-vessel CABG in 1995, congestive heart failure with EF 25-30%, pacemaker, AICD, history of goiter, history of renal stones in the remote past, CKD stage III, admitted with ventricular tachycardia and placed on amiodarone  1. Acute renal failure/chronic kidney disease stage III Baseline creatinine 1.4. The patient has worsening renal function now. Agree with cardiology that this most likely represents renal dysfunction secondary to acute cardiorenal syndrome.  -  Volume overload persists in the setting of acute renal failure. We will plan for Lasix 60 mg IV 1 today. If this is not effective we may need to consider short-term hemodialysis with ultrafiltration. Continue to monitor renal parameters closely.  2.  Hyponatremia. Serum sodium low today at 131. Likely secondary to underlying heart failure. Lasix being administered as above today.  3. Acute systolic heart failure. Ejection fraction 25-30%. Cardiology following closely.  - 1 time dose of Lasix 61 g IV ordered today. We may need to consider ultrafiltration as above.  4. Ventricular tachycardia. Patient has been transitioned to by mouth amiodarone.  5. Anemia of chronic kidney disease. Hemoglobin currently 11.5. No indication for  Procrit.   LOS: 3 Lanis Storlie 9/10/201712:15 PM

## 2015-09-27 NOTE — Progress Notes (Signed)
Patient ID: Tom Nguyen, male   DOB: 01/26/1932, 82 y.o.   MRN: [id number]   SUBJECTIVE: No VT overnight.  Short of breath walking to door.  Creatinine up to 3.1.   Scheduled Meds: . amiodarone  400 mg Oral BID  . carvedilol  3.125 mg Oral BID WC  . clopidogrel  75 mg Oral Daily  . famotidine  20 mg Oral Daily  . furosemide  60 mg Intravenous Once  . levothyroxine  75 mcg Oral QAC breakfast  . montelukast  10 mg Oral QHS  . simvastatin  40 mg Oral q1800  . sodium chloride flush  3 mL Intravenous Q12H   Continuous Infusions:  PRN Meds:.sodium chloride, sodium chloride, acetaminophen, ipratropium-albuterol, morphine injection, ondansetron (ZOFRAN) IV, sodium chloride flush    Vitals:   09/27/15 0522 09/27/15 0549 09/27/15 0900 09/27/15 1130  BP: 112/70  (!) 105/59 (!) 93/55  Pulse: 69  67 63  Resp: 15  16   Temp: 97.7 F (36.5 C)  97.5 F (36.4 C)   TempSrc: Oral  Oral   SpO2: 100%  97%   Weight:  181 lb 8 oz (82.3 kg)    Height:        Intake/Output Summary (Last 24 hours) at 09/27/15 1152 Last data filed at 09/27/15 0550  Gross per 24 hour  Intake              480 ml  Output              275 ml  Net              205 ml    LABS: Basic Metabolic Panel:  Recent Labs  35/32/99 0555 09/26/15 0449 09/27/15 0702  NA 134* 133* 131*  K 4.3 4.4 4.4  CL 103 101 99*  CO2 20* 19* 19*  GLUCOSE 125* 101* 91  BUN 33* 41* 48*  CREATININE 2.05* 2.73* 3.16*  CALCIUM 8.2* 8.1* 8.1*  MG 2.2 2.1  --   PHOS 3.5 4.0 4.1   Liver Function Tests:  Recent Labs  09/24/15 1927  AST 42*  ALT 26  ALKPHOS 97  BILITOT 1.6*  PROT 6.5  ALBUMIN 3.6   No results for input(s): LIPASE, AMYLASE in the last 72 hours. CBC:  Recent Labs  09/25/15 0555 09/26/15 0449  WBC 8.3 8.1  HGB 11.5* 11.5*  HCT 33.9* 33.1*  MCV 94.7 92.9  PLT 109* 101*   Cardiac Enzymes:  Recent Labs  09/24/15 1927 09/25/15 0900 09/25/15 1722 09/26/15 0449  CKTOTAL  --   --   --  58  TROPONINI  0.05* 1.18* 0.89*  --    BNP: Invalid input(s): POCBNP D-Dimer: No results for input(s): DDIMER in the last 72 hours. Hemoglobin A1C: No results for input(s): HGBA1C in the last 72 hours. Fasting Lipid Panel: No results for input(s): CHOL, HDL, LDLCALC, TRIG, CHOLHDL, LDLDIRECT in the last 72 hours. Thyroid Function Tests: No results for input(s): TSH, T4TOTAL, T3FREE, THYROIDAB in the last 72 hours.  Invalid input(s): FREET3 Anemia Panel: No results for input(s): VITAMINB12, FOLATE, FERRITIN, TIBC, IRON, RETICCTPCT in the last 72 hours.  RADIOLOGY: US Renal  Result Date: 09/27/2015 CLINICAL DATA:  Acute renal failure. EXAM: RENAL / URINARY TRACT ULTRASOUND COMPLETE COMPARISON:  08/07/2015 FINDINGS: Right Kidney: Length: 9.9 cm. Cortical thinning noted with slight increased cortical echogenicity. No hydronephrosis. Left Kidney: Length: 11.5 cm multiple simple appearing cysts are identified, as before. No hydronephrosis. Echogenicity within normal limits.  No mass or hydronephrosis visualized. Bladder: Appears normal for degree of bladder distention. Other:  Small volume ascites noted with 2.8 cm hepatic cyst evident. IMPRESSION: No evidence for hydronephrosis. Electronically Signed   By: Kennith Center M.D.   On: 09/27/2015 10:30   Dg Chest Port 1 View  Result Date: 09/25/2015 CLINICAL DATA:  82 year old male with shortness of breath. EXAM: PORTABLE CHEST 1 VIEW COMPARISON:  Chest radiograph dated 09/24/2015 FINDINGS: There is stable moderate cardiomegaly with minimal central vascular prominence. There is no focal consolidation, pleural effusion, or pneumothorax. Right apical pleural thickening. Left pectoral AICD device and median sternotomy wires noted. There is osteopenia with degenerative changes of the spine. Bilateral shoulder arthroplasty. No acute fracture. IMPRESSION: Cardiomegaly with probable mild congestive changes. No focal consolidation or edema. Electronically Signed   By: Elgie Collard M.D.   On: 09/25/2015 02:59   Dg Chest Port 1 View  Result Date: 09/24/2015 CLINICAL DATA:  Dyspnea EXAM: PORTABLE CHEST 1 VIEW COMPARISON:  08/12/2015 FINDINGS: Cardiomegaly again noted. Status post median sternotomy. 3 leads cardiac pacemaker is unchanged in position. No infiltrate or pulmonary edema. Bilateral shoulder prosthesis again noted. IMPRESSION: Cardiomegaly. No active disease. Cardiac pacemaker unchanged in position. Electronically Signed   By: Natasha Mead M.D.   On: 09/24/2015 20:24    PHYSICAL EXAM General: NAD Neck: JVP 12+ cm, no thyromegaly or thyroid nodule.  Lungs: Crackles at bases bilaterally.  CV: Lateral PMI.  Heart regular S1/S2, no S3/S4, no murmur.  1+ edema 1/2 to knees bilaterally.   Abdomen: Soft, nontender, no hepatosplenomegaly, mild distention.  Neurologic: Alert and oriented x 3.  Psych: Normal affect. Extremities: No clubbing or cyanosis.   TELEMETRY: Reviewed telemetry pt in NSR, BiV paced  ASSESSMENT AND PLAN: 82 with history of CAD s/p CABG, ischemic cardiomyopathy s/p [employer] CRT-D, paroxysmal atrial fibrillation, CKD admitted with VT and ICD discharge.   1. VT: Appropriate ICD discharge.  No further sustained VT here, reloading amiodarone via gtt. Do not think this was triggered by ACS, likely scar-mediated. - Continue amiodarone 400 mg bid, likely home eventually on 200 mg bid.  2. CAD: s/p CABG.  Had LHC in 7/17 with no options for revascularization.  Would not repeat cath (especially with AKI). Elevated troponin may be demand ischemia with volume overload and defibrillation.  - Continue Plavix, statin.  3. Acute on chronic systolic CHF: EF 63-33% on 7/17 echo, ischemic cardiomyopathy.  RHC in 7/17 showed low cardiac output.  Difficult situation.  He is volume overloaded on exam but creatinine up to 2.73 (AKI).  I suspect he has low output heart failure with a component of cardiorenal syndrome.  This will be very difficult to  manage, would try to hold off on milrinone gtt use with recent sustained VT.  SBP in 90s-100s.  - Keep off Entresto.  - Despite rise in creatinine, weight is up and he is more volume overloaded.  Lasix 60 mg IV x 1 now and follow response.  Repeat BMET in am.  Unless large rise in creatinine, would likely try to diurese him further tomorrow.   4. AKI on CKD stage III: Most likely hemodynamic.  Known low cardiac output based on RHC in 7/17.  Lasix 60 mg IV x 1 today as above.   Marca Ancona 09/27/2015 11:52 AM

## 2015-09-28 ENCOUNTER — Ambulatory Visit: Payer: Medicare Other | Admitting: Family

## 2015-09-28 LAB — BASIC METABOLIC PANEL
Anion gap: 12 (ref 5–15)
BUN: 52 mg/dL — AB (ref 6–20)
CHLORIDE: 99 mmol/L — AB (ref 101–111)
CO2: 22 mmol/L (ref 22–32)
CREATININE: 3.52 mg/dL — AB (ref 0.61–1.24)
Calcium: 8.2 mg/dL — ABNORMAL LOW (ref 8.9–10.3)
GFR calc Af Amer: 17 mL/min — ABNORMAL LOW (ref >60)
GFR calc non Af Amer: 15 mL/min — ABNORMAL LOW (ref >60)
GLUCOSE: 93 mg/dL (ref 65–99)
POTASSIUM: 4.2 mmol/L (ref 3.5–5.1)
SODIUM: 133 mmol/L — AB (ref 135–145)

## 2015-09-28 LAB — PHOSPHORUS: Phosphorus: 4.3 mg/dL (ref 2.5–4.6)

## 2015-09-28 MED ORDER — FUROSEMIDE 40 MG PO TABS
40.0000 mg | ORAL_TABLET | Freq: Two times a day (BID) | ORAL | Status: DC
  Administered 2015-09-28 – 2015-09-30 (×3): 40 mg via ORAL
  Filled 2015-09-28 (×3): qty 1

## 2015-09-28 NOTE — Progress Notes (Signed)
Hospital Problem List     Principal Problem:   Sustained ventricular fibrillation (HCC) Active Problems:   Cardiomyopathy, ischemic   Atherosclerosis of coronary artery bypass graft of native heart without angina pectoris   Pulmonary HTN (HCC)   Centrilobular emphysema (HCC)   Chronic systolic heart failure [facility])    Patient Profile:   Primary Cardiologist: Dr. Mariah Milling  82 y.o. male w/ PMH of CAD (s/p CABG in 1995), chronic combined CHF (NYHA class 3/ICM with EF of 25-30% by echo, s/p [employer] Bi-V ICD), underlying LBBB, chronic respiratory failure 2/2 COPD with long history of tobacco abuse, and OSA (on CPAP) who presented on 9/7 with chest pain, SOB, diaphoresis, and palpitations found to be in symptomatic sustained VT upon arrival to the ED.   Subjective   Breathing has improved, but still feels weak. Denies any chest discomfort or palpitations.   Inpatient Medications    . amiodarone  400 mg Oral BID  . carvedilol  3.125 mg Oral BID WC  . clopidogrel  75 mg Oral Daily  . famotidine  20 mg Oral Daily  . levothyroxine  75 mcg Oral QAC breakfast  . montelukast  10 mg Oral QHS  . simvastatin  40 mg Oral q1800  . sodium chloride flush  3 mL Intravenous Q12H    Vital Signs    Vitals:   09/27/15 1953 09/28/15 0413 09/28/15 0508 09/28/15 0817  BP: 92/63 101/61  110/64  Pulse: 64 63  66  Resp: 18 18    Temp: 97.7 F (36.5 C) 97.6 F (36.4 C)    TempSrc: Oral Oral    SpO2: 94% 98%    Weight:   177 lb 1.6 oz (80.3 kg)   Height:        Intake/Output Summary (Last 24 hours) at 09/28/15 1124 Last data filed at 09/28/15 1013  Gross per 24 hour  Intake              240 ml  Output              600 ml  Net             -360 ml   Filed Weights   09/26/15 0331 09/27/15 0549 09/28/15 0508  Weight: 175 lb 4.8 oz (79.5 kg) 181 lb 8 oz (82.3 kg) 177 lb 1.6 oz (80.3 kg)    Physical Exam    General: Elderly Caucasian male appearing in no acute distress. Head:  Normocephalic, atraumatic.  Neck: Supple without bruits, JVD remains elevated at 9cm. Lungs:  Resp regular and unlabored, rales at bases bilaterally. Heart: RRR, S1, S2, no S3, S4, or murmur; no rub. Abdomen: Soft, non-tender, non-distended with normoactive bowel sounds. No hepatomegaly. No rebound/guarding. No obvious abdominal masses. Extremities: No clubbing or cyanosis, 1+ edema bilaterally. Distal pedal pulses are 2+ bilaterally. Neuro: Alert and oriented X 3. Moves all extremities spontaneously. Psych: Normal affect.  Labs    CBC  Recent Labs  09/26/15 0449  WBC 8.1  HGB 11.5*  HCT 33.1*  MCV 92.9  PLT 101*   Basic Metabolic Panel  Recent Labs  09/26/15 0449 09/27/15 0702 09/28/15 0543  NA 133* 131* 133*  K 4.4 4.4 4.2  CL 101 99* 99*  CO2 19* 19* 22  GLUCOSE 101* 91 93  BUN 41* 48* 52*  CREATININE 2.73* 3.16* 3.52*  CALCIUM 8.1* 8.1* 8.2*  MG 2.1  --   --   PHOS 4.0 4.1 4.3  Liver Function Tests No results for input(s): AST, ALT, ALKPHOS, BILITOT, PROT, ALBUMIN in the last 72 hours. No results for input(s): LIPASE, AMYLASE in the last 72 hours. Cardiac Enzymes  Recent Labs  09/25/15 1722 09/26/15 0449  CKTOTAL  --  58  TROPONINI 0.89*  --    BNP Invalid input(s): POCBNP D-Dimer No results for input(s): DDIMER in the last 72 hours. Hemoglobin A1C No results for input(s): HGBA1C in the last 72 hours. Fasting Lipid Panel No results for input(s): CHOL, HDL, LDLCALC, TRIG, CHOLHDL, LDLDIRECT in the last 72 hours. Thyroid Function Tests No results for input(s): TSH, T4TOTAL, T3FREE, THYROIDAB in the last 72 hours.  Invalid input(s): FREET3  Telemetry    V-paced, HR in 60's.   ECG    No new tracings.    Cardiac Studies and Radiology    US Renal  Result Date: 09/27/2015 CLINICAL DATA:  Acute renal failure. EXAM: RENAL / URINARY TRACT ULTRASOUND COMPLETE COMPARISON:  08/07/2015 FINDINGS: Right Kidney: Length: 9.9 cm. Cortical thinning noted  with slight increased cortical echogenicity. No hydronephrosis. Left Kidney: Length: 11.5 cm multiple simple appearing cysts are identified, as before. No hydronephrosis. Echogenicity within normal limits. No mass or hydronephrosis visualized. Bladder: Appears normal for degree of bladder distention. Other:  Small volume ascites noted with 2.8 cm hepatic cyst evident. IMPRESSION: No evidence for hydronephrosis. Electronically Signed   By: Kennith Center M.D.   On: 09/27/2015 10:30   Dg Chest Port 1 View  Result Date: 09/25/2015 CLINICAL DATA:  82 year old male with shortness of breath. EXAM: PORTABLE CHEST 1 VIEW COMPARISON:  Chest radiograph dated 09/24/2015 FINDINGS: There is stable moderate cardiomegaly with minimal central vascular prominence. There is no focal consolidation, pleural effusion, or pneumothorax. Right apical pleural thickening. Left pectoral AICD device and median sternotomy wires noted. There is osteopenia with degenerative changes of the spine. Bilateral shoulder arthroplasty. No acute fracture. IMPRESSION: Cardiomegaly with probable mild congestive changes. No focal consolidation or edema. Electronically Signed   By: Elgie Collard M.D.   On: 09/25/2015 02:59   Dg Chest Port 1 View  Result Date: 09/24/2015 CLINICAL DATA:  Dyspnea EXAM: PORTABLE CHEST 1 VIEW COMPARISON:  08/12/2015 FINDINGS: Cardiomegaly again noted. Status post median sternotomy. 3 leads cardiac pacemaker is unchanged in position. No infiltrate or pulmonary edema. Bilateral shoulder prosthesis again noted. IMPRESSION: Cardiomegaly. No active disease. Cardiac pacemaker unchanged in position. Electronically Signed   By: Natasha Mead M.D.   On: 09/24/2015 20:24    Assessment & Plan    1. VT - Appropriate ICD discharge, thought to be scar medicated.  No further sustained VT noted on telemetry while admitted. - started on IV Amiodarone at time of admission. Has been transitioned to PO Amiodarone 400 mg BID, likely home  eventually on 200 mg BID.  - has been started on low-dose BB  2. CAD - s/p CABG in 1995.  Had LHC in 07/2015 with no options for revascularization.  Would not repeat cath (especially with AKI).  - cyclic troponin values have been elevated at 0.05, 1.18, and 0.89. Thought to be secondary to demand ischemia with volume overload and ICD discharge.  - Continue Plavix, statin, and recently started BB.   3. Acute on chronic systolic CHF - EF 25-30% by echo in 7/17, secondary to ischemic cardiomyopathy. RHC in 7/17 showed low cardiac output. Seen by Dr. Shirlee Latch and thought to have low output heart failure with a component of cardiorenal syndrome. Recommend trying to avoid Milrinone  gtt use with recent sustained VT.  - No Entresto with low SBP. No ACE-I/ABR/Spironolactone with AKI.  - discussed with Nephrology, will plan on resuming PO Lasix today.  4. AKI on Stage 3 CKD  - Most likely hemodynamic. Known low cardiac output based on RHC in 7/17.   - creatinine 1.77 on admission, elevated to 3.52 on 09/28/2015.  5. Deconditioning - will need PT/OT evaluation prior to discharge.    Leonides Schanz Ellsworth Lennox , PA-C 11:24 AM 09/28/2015 Pager: [phone]

## 2015-09-28 NOTE — Progress Notes (Signed)
[facility]  ROUNDING NOTE   Subjective:   Wife at bedside.  Na 133 Creatinine 3.5 (3.16)  Objective:  Vital signs in last 24 hours:  Temp:  [97.6 F (36.4 C)-97.7 F (36.5 C)] 97.6 F (36.4 C) (09/11 0413) Pulse Rate:  [63-66] 66 (09/11 0817) Resp:  [18] 18 (09/11 0413) BP: (92-110)/(55-64) 110/64 (09/11 0817) SpO2:  [94 %-98 %] 98 % (09/11 0413) Weight:  [80.3 kg (177 lb 1.6 oz)] 80.3 kg (177 lb 1.6 oz) (09/11 0508)  Weight change: -1.996 kg (-4 lb 6.4 oz) Filed Weights   09/26/15 0331 09/27/15 0549 09/28/15 0508  Weight: 79.5 kg (175 lb 4.8 oz) 82.3 kg (181 lb 8 oz) 80.3 kg (177 lb 1.6 oz)    Intake/Output: I/O last 3 completed shifts: In: -  Out: 625 [Urine:625]   Intake/Output this shift:  Total I/O In: 240 [P.O.:240] Out: 50 [Urine:50]  Physical Exam: General: No acute distress  Head: Normocephalic, atraumatic. Moist oral mucosal membranes  Eyes: Anicteric  Neck: Supple, trachea midline  Lungs:  Basilar rales, normal effort  Heart: S1S2 no rubs  Abdomen:  Soft, nontender, BS present  Extremities: 1+ peripheral edema.  Neurologic: Nonfocal, moving all four extremities  Skin: No lesions  Access:     Basic Metabolic Panel:  Recent Labs Lab 09/24/15 1927 09/25/15 0555 09/26/15 0449 09/27/15 0702 09/28/15 0543  NA 136 134* 133* 131* 133*  K 4.2 4.3 4.4 4.4 4.2  CL 101 103 101 99* 99*  CO2 21* 20* 19* 19* 22  GLUCOSE 133* 125* 101* 91 93  BUN 32* 33* 41* 48* 52*  CREATININE 1.77* 2.05* 2.73* 3.16* 3.52*  CALCIUM 8.9 8.2* 8.1* 8.1* 8.2*  MG  --  2.2 2.1  --   --   PHOS  --  3.5 4.0 4.1 4.3    Liver Function Tests:  Recent Labs Lab 09/24/15 1927  AST 42*  ALT 26  ALKPHOS 97  BILITOT 1.6*  PROT 6.5  ALBUMIN 3.6   No results for input(s): LIPASE, AMYLASE in the last 168 hours. No results for input(s): AMMONIA in the last 168 hours.  CBC:  Recent Labs Lab 09/24/15 1927 09/25/15 0555 09/26/15 0449  WBC 7.5 8.3 8.1   HGB 11.1* 11.5* 11.5*  HCT 33.2* 33.9* 33.1*  MCV 95.6 94.7 92.9  PLT 120* 109* 101*    Cardiac Enzymes:  Recent Labs Lab 09/24/15 1927 09/25/15 0900 09/25/15 1722 09/26/15 0449  CKTOTAL  --   --   --  58  TROPONINI 0.05* 1.18* 0.89*  --     BNP: Invalid input(s): POCBNP  CBG:  Recent Labs Lab 09/24/15 2226  GLUCAP 142*    Microbiology: Results for orders placed or performed during the hospital encounter of 09/24/15  MRSA PCR Screening     Status: None   Collection Time: 09/24/15 10:26 PM  Result Value Ref Range Status   MRSA by PCR NEGATIVE NEGATIVE Final    Comment:        The GeneXpert MRSA Assay (FDA approved for NASAL specimens only), is one component of a comprehensive MRSA colonization surveillance program. It is not intended to diagnose MRSA infection nor to guide or monitor treatment for MRSA infections.     Coagulation Studies: No results for input(s): LABPROT, INR in the last 72 hours.  Urinalysis: No results for input(s): COLORURINE, LABSPEC, PHURINE, GLUCOSEU, HGBUR, BILIRUBINUR, KETONESUR, PROTEINUR, UROBILINOGEN, NITRITE, LEUKOCYTESUR in the last 72 hours.  Invalid input(s): APPERANCEUR  Imaging: US Renal  Result Date: 09/27/2015 CLINICAL DATA:  Acute renal failure. EXAM: RENAL / URINARY TRACT ULTRASOUND COMPLETE COMPARISON:  08/07/2015 FINDINGS: Right Kidney: Length: 9.9 cm. Cortical thinning noted with slight increased cortical echogenicity. No hydronephrosis. Left Kidney: Length: 11.5 cm multiple simple appearing cysts are identified, as before. No hydronephrosis. Echogenicity within normal limits. No mass or hydronephrosis visualized. Bladder: Appears normal for degree of bladder distention. Other:  Small volume ascites noted with 2.8 cm hepatic cyst evident. IMPRESSION: No evidence for hydronephrosis. Electronically Signed   By: Kennith Center M.D.   On: 09/27/2015 10:30     Medications:     . amiodarone  400 mg Oral BID  .  carvedilol  3.125 mg Oral BID WC  . clopidogrel  75 mg Oral Daily  . famotidine  20 mg Oral Daily  . levothyroxine  75 mcg Oral QAC breakfast  . montelukast  10 mg Oral QHS  . simvastatin  40 mg Oral q1800  . sodium chloride flush  3 mL Intravenous Q12H   sodium chloride, sodium chloride, acetaminophen, ipratropium-albuterol, morphine injection, ondansetron (ZOFRAN) IV, sodium chloride flush  Assessment/ Plan:  82 y.o.white male with COPD, rheumatoid arthritis, hypertension, coronary artery disease status post CABG, systolic congestive heart failure, pacemaker/AICD  1. Acute renal failure on chronic kidney disease stage III Baseline creatinine 1.4 09/01/15  Acute renal failure from acute cardiorenal syndrome - Furosemide PO 40mg  bid - Discussed dialysis with patient and family.   2.  Hyponatremia: secondary to congestive heart failure - furosemide as above.   3. Acute systolic congestive heart failure. Ejection fraction 25-30%.  - furosemide PO 40mg  bid   LOS: 4 Tom Nguyen 9/11/201711:21 AM

## 2015-09-28 NOTE — Progress Notes (Signed)
A&O. No complaints. Slept with Bipap on. On IV lasix.  Paced on tele.

## 2015-09-28 NOTE — Progress Notes (Signed)
[facility] [place] [facility]   PATIENT NAME: Tom Nguyen    MR#:  [id number]  DATE OF BIRTH:  1932-03-31  SUBJECTIVE:  CHIEF COMPLAINT:   Chief Complaint  Patient presents with  . Chest Pain   He has some shortness of breath and dry cough. Poor urine output.  REVIEW OF SYSTEMS:    Review of Systems  Constitutional: Negative for chills and fever.  HENT: Negative for sore throat.   Eyes: Negative for blurred vision, double vision and pain.  Respiratory: Negative for cough, hemoptysis, shortness of breath and wheezing.   Cardiovascular: Negative for chest pain, palpitations, orthopnea and leg swelling.  Gastrointestinal: Negative for abdominal pain, constipation, diarrhea, heartburn, nausea and vomiting.  Genitourinary: Negative for dysuria and hematuria.  Musculoskeletal: Negative for back pain and joint pain.  Skin: Negative for rash.  Neurological: Negative for sensory change, speech change, focal weakness and headaches.  Endo/Heme/Allergies: Does not bruise/bleed easily.  Psychiatric/Behavioral: Negative for depression. The patient is not nervous/anxious.     DRUG ALLERGIES:  No Known Allergies  VITALS:  Blood pressure (!) 94/49, pulse (!) 59, temperature 98.5 F (36.9 C), resp. rate 12, height 5\' 8"  (1.727 m), weight 80.3 kg (177 lb 1.6 oz), SpO2 97 %.  PHYSICAL EXAMINATION:   Physical Exam  GENERAL:  82 y.o.-year-old patient lying in the bed with no acute distress.  EYES: Pupils equal, round, reactive to light and accommodation. No scleral icterus. Extraocular muscles intact.  HEENT: Head atraumatic, normocephalic. Oropharynx and nasopharynx clear.  NECK:  Supple, no jugular venous distention. No thyroid enlargement, no tenderness.  LUNGS: Normal work of breathing. Bibasilar crackles. no wheezing, rhonchi. No use of accessory muscles of respiration.  CARDIOVASCULAR: S1, S2 normal. No murmurs, rubs, or gallops.  ABDOMEN: Soft,  nontender, nondistended. Bowel sounds present. No organomegaly or mass.  EXTREMITIES: No cyanosis, clubbing or edema b/l.    NEUROLOGIC: Cranial nerves II through XII are intact. No focal Motor or sensory deficits b/l.   PSYCHIATRIC: The patient is alert and oriented x 3.  SKIN: No obvious rash, lesion, or ulcer.   LABORATORY PANEL:   CBC  Recent Labs Lab 09/26/15 0449  WBC 8.1  HGB 11.5*  HCT 33.1*  PLT 101*   ------------------------------------------------------------------------------------------------------------------ Chemistries   Recent Labs Lab 09/24/15 1927  09/26/15 0449  09/28/15 0543  NA 136  < > 133*  < > 133*  K 4.2  < > 4.4  < > 4.2  CL 101  < > 101  < > 99*  CO2 21*  < > 19*  < > 22  GLUCOSE 133*  < > 101*  < > 93  BUN 32*  < > 41*  < > 52*  CREATININE 1.77*  < > 2.73*  < > 3.52*  CALCIUM 8.9  < > 8.1*  < > 8.2*  MG  --   < > 2.1  --   --   AST 42*  --   --   --   --   ALT 26  --   --   --   --   ALKPHOS 97  --   --   --   --   BILITOT 1.6*  --   --   --   --   < > = values in this interval not displayed. ------------------------------------------------------------------------------------------------------------------  Cardiac Enzymes  Recent Labs Lab 09/25/15 1722  TROPONINI 0.89*   ------------------------------------------------------------------------------------------------------------------  RADIOLOGY:  11/25/15 Renal  Result Date: 09/27/2015 CLINICAL DATA:  Acute renal failure. EXAM: RENAL / URINARY TRACT ULTRASOUND COMPLETE COMPARISON:  08/07/2015 FINDINGS: Right Kidney: Length: 9.9 cm. Cortical thinning noted with slight increased cortical echogenicity. No hydronephrosis. Left Kidney: Length: 11.5 cm multiple simple appearing cysts are identified, as before. No hydronephrosis. Echogenicity within normal limits. No mass or hydronephrosis visualized. Bladder: Appears normal for degree of bladder distention. Other:  Small volume ascites noted  with 2.8 cm hepatic cyst evident. IMPRESSION: No evidence for hydronephrosis. Electronically Signed   By: Kennith Center M.D.   On: 09/27/2015 10:30     ASSESSMENT AND PLAN:   * AKI over CKD - worsening Likely cardiorenal syndrome. Discussed with Dr. Wynelle Link with nephrology. Start oral Lasix 40 twice a day. Monitor I/Os. Renal ultrasound showed no obstruction Repeat labs in the morning. Likely needs dialysis soon if any further worsening or poor urine output  * Acute on chronic systolic CHF Start Lasix oral. Received 1 dose of IV Lasix yesterday. Daily weight  * Sustained VT - s/p cardioversion A meter dose increased. Stable Seen by Cardiology/EP. Appreciate help.  * CAD Elevated troponin likely due to demand ischemia being in ventricular tachycardia and status post cardioversion.  *Hypothyroidism On levothyroxine  * Chronic respiratory failure with COPD Stable  All the records are reviewed and case discussed with Care Management/Social Workerr. Management plans discussed with the patient, family and they are in agreement.  CODE STATUS: FULL CODE  DVT Prophylaxis: SCDs  TOTAL TIME TAKING CARE OF THIS PATIENT: 30 minutes.   POSSIBLE D/C IN 1-2 DAYS, DEPENDING ON CLINICAL CONDITION.  Milagros Loll R M.D on 09/28/2015 at 12:47 PM  Between 7am to 6pm - Pager - [phone]  After 6pm go to [email or web address] - password [email or web address]  [facility]  Office  [phone]  CC: Primary care physician; Olevia Perches, DO  Note: This dictation was prepared with Dragon dictation along with smaller phrase technology. Any transcriptional errors that result from this process are unintentional.

## 2015-09-28 NOTE — Care Management (Addendum)
Barrier- worsening renal status.  Trying oral lasix.  Minimal output.  Concern will require dialysis.  Requested PT consult

## 2015-09-29 DIAGNOSIS — I472 Ventricular tachycardia, unspecified: Secondary | ICD-10-CM

## 2015-09-29 DIAGNOSIS — N171 Acute kidney failure with acute cortical necrosis: Secondary | ICD-10-CM

## 2015-09-29 DIAGNOSIS — I5023 Acute on chronic systolic (congestive) heart failure: Secondary | ICD-10-CM

## 2015-09-29 DIAGNOSIS — N179 Acute kidney failure, unspecified: Secondary | ICD-10-CM

## 2015-09-29 LAB — BASIC METABOLIC PANEL WITH GFR
Anion gap: 10 (ref 5–15)
BUN: 58 mg/dL — ABNORMAL HIGH (ref 6–20)
CO2: 22 mmol/L (ref 22–32)
Calcium: 8.3 mg/dL — ABNORMAL LOW (ref 8.9–10.3)
Chloride: 101 mmol/L (ref 101–111)
Creatinine, Ser: 3.54 mg/dL — ABNORMAL HIGH (ref 0.61–1.24)
GFR calc Af Amer: 17 mL/min — ABNORMAL LOW
GFR calc non Af Amer: 15 mL/min — ABNORMAL LOW
Glucose, Bld: 96 mg/dL (ref 65–99)
Potassium: 4 mmol/L (ref 3.5–5.1)
Sodium: 133 mmol/L — ABNORMAL LOW (ref 135–145)

## 2015-09-29 LAB — PHOSPHORUS: Phosphorus: 4.1 mg/dL (ref 2.5–4.6)

## 2015-09-29 MED ORDER — MONTELUKAST SODIUM 10 MG PO TABS: 10.0000 mg | ORAL_TABLET | Freq: Every day | ORAL | Status: DC

## 2015-09-29 MED ORDER — UMECLIDINIUM-VILANTEROL 62.5-25 MCG/INH IN AEPB
1.0000 | INHALATION_SPRAY | Freq: Every day | RESPIRATORY_TRACT | Status: DC
  Administered 2015-09-29 – 2015-09-30 (×2): 1 via RESPIRATORY_TRACT
  Filled 2015-09-29: qty 14

## 2015-09-29 MED ORDER — ALLOPURINOL 100 MG PO TABS
100.0000 mg | ORAL_TABLET | Freq: Every day | ORAL | Status: DC
  Administered 2015-09-29 – 2015-09-30 (×2): 100 mg via ORAL
  Filled 2015-09-29 (×2): qty 1

## 2015-09-29 MED ORDER — FOLIC ACID 1 MG PO TABS
1.0000 mg | ORAL_TABLET | Freq: Every day | ORAL | Status: DC
  Administered 2015-09-29 – 2015-09-30 (×2): 1 mg via ORAL
  Filled 2015-09-29 (×2): qty 1

## 2015-09-29 MED ORDER — SIMVASTATIN 40 MG PO TABS: 40.0000 mg | ORAL_TABLET | Freq: Every day | ORAL | Status: DC

## 2015-09-29 MED ORDER — DOPAMINE-DEXTROSE 3.2-5 MG/ML-% IV SOLN
5.0000 ug/kg/min | INTRAVENOUS | Status: DC
  Administered 2015-09-29: 2 ug/kg/min via INTRAVENOUS
  Administered 2015-09-29: 5 ug/kg/min via INTRAVENOUS
  Administered 2015-09-29: 3 ug/kg/min via INTRAVENOUS
  Filled 2015-09-29: qty 250

## 2015-09-29 MED ORDER — LEFLUNOMIDE 10 MG PO TABS
20.0000 mg | ORAL_TABLET | Freq: Every day | ORAL | Status: DC
  Administered 2015-09-29 – 2015-09-30 (×2): 20 mg via ORAL
  Filled 2015-09-29 (×2): qty 2

## 2015-09-29 MED ORDER — LEFLUNOMIDE 20 MG PO TABS
20.0000 mg | ORAL_TABLET | Freq: Every day | ORAL | Status: DC
  Filled 2015-09-29: qty 1

## 2015-09-29 NOTE — Progress Notes (Signed)
Patient has being moved to rm [id number] and is is resting quietly at this time  Rating pain level 4/10 , will continue to monitor

## 2015-09-29 NOTE — Progress Notes (Addendum)
Patient was started on dopamine drip at 2 mcg/kg/min  Titrate to 5 mcg/kg/min for renal perfusion, , 1 hrs into the infusion pt heart rate is noted to be increasing between 140's 160's DR Mariah Milling was made aware and also DR Sudini, who rush to pt's bedside , dopamine drip was titrate down to 2.5 mcg/ kg/ min with HR still in the 140's and SBP drop's to the 70's  Them HR  to the 80's , them  HR  back to 140's . Drip was stopped as per MD order, HR now 88 and bp 115/65 , will continue to monitor pt closely , patient was also having some chest pain and pressure during this episode .

## 2015-09-29 NOTE — Care Management (Signed)
Trial of dopamine infusion. Heart rate up to 140-160 of sustained  v tach.  Converted back to nsr.  Developed chest pain during this episode.  Dopamine was stopped.   Cardiology had long discussion with wife and patient about code status. Patient stated he would not want aggressive measure but wife can not accept that.  Palliative care consult placed to discuss the code status.  At present, patient is full code

## 2015-09-29 NOTE — Progress Notes (Signed)
Hospital Problem List     Principal Problem:   Sustained ventricular fibrillation (HCC) Active Problems:   Cardiomyopathy, ischemic   Atherosclerosis of coronary artery bypass graft of native heart without angina pectoris   Pulmonary HTN (HCC)   Centrilobular emphysema (HCC)   Acute on chronic systolic (congestive) heart failure [facility])    Patient Profile:   Primary Cardiologist: Dr. Mariah Milling  82 y.o.malew/ PMH of CAD (s/p CABG in 1995), chronic combined CHF (NYHA class 3/ICM with EF of 25-30% by echo, s/p [employer] Bi-V ICD), underlying LBBB, chronic respiratory failure 2/2 COPD with long history of tobacco abuse, and OSA (on CPAP) who presented on 9/7 with chest pain, SOB, diaphoresis, and palpitations found to be in symptomatic sustained VT upon arrival to the ED.   Subjective   No repeat episodes of chest discomfort. Breathing and edema improved. Ambulated 275 ft with PT this AM.  Inpatient Medications    . amiodarone  400 mg Oral BID  . carvedilol  3.125 mg Oral BID WC  . clopidogrel  75 mg Oral Daily  . famotidine  20 mg Oral Daily  . furosemide  40 mg Oral BID  . levothyroxine  75 mcg Oral QAC breakfast  . montelukast  10 mg Oral QHS  . simvastatin  40 mg Oral q1800  . sodium chloride flush  3 mL Intravenous Q12H    Vital Signs    Vitals:   09/28/15 2003 09/29/15 0348 09/29/15 0816 09/29/15 1119  BP: (!) 106/59 116/60 (!) 111/57 (!) 95/51  Pulse: 69 70 68 62  Resp: 18 18 18 12   Temp: 98.1 F (36.7 C) 97.9 F (36.6 C) 97.5 F (36.4 C) 97.8 F (36.6 C)  TempSrc: Oral Oral Oral Oral  SpO2: 94% 93% 93% 100%  Weight:  178 lb 6.4 oz (80.9 kg)    Height:        Intake/Output Summary (Last 24 hours) at 09/29/15 1243 Last data filed at 09/29/15 0945  Gross per 24 hour  Intake              720 ml  Output              775 ml  Net              -55 ml   Filed Weights   09/27/15 0549 09/28/15 0508 09/29/15 0348  Weight: 181 lb 8 oz (82.3 kg) 177 lb  1.6 oz (80.3 kg) 178 lb 6.4 oz (80.9 kg)    Physical Exam    General: Elderly Caucasian male appearing in no acute distress. Head: Normocephalic, atraumatic.  Neck: Supple without bruits, JVD at 8cm. Lungs:  Resp regular and unlabored, rales at bases bilaterally. Heart: RRR, S1, S2, no S3, S4, or murmur; no rub. Abdomen: Soft, non-tender, non-distended with normoactive bowel sounds. No hepatomegaly. No rebound/guarding. No obvious abdominal masses. Extremities: No clubbing or cyanosis, 1+ edema bilaterally. Distal pedal pulses are 2+ bilaterally. Neuro: Alert and oriented X 3. Moves all extremities spontaneously. Psych: Normal affect.  Labs    CBC No results for input(s): WBC, NEUTROABS, HGB, HCT, MCV, PLT in the last 72 hours. Basic Metabolic Panel  Recent Labs  09/28/15 0543 09/29/15 0459  NA 133* 133*  K 4.2 4.0  CL 99* 101  CO2 22 22  GLUCOSE 93 96  BUN 52* 58*  CREATININE 3.52* 3.54*  CALCIUM 8.2* 8.3*  PHOS 4.3 4.1   Liver Function Tests No results for input(s):  AST, ALT, ALKPHOS, BILITOT, PROT, ALBUMIN in the last 72 hours. No results for input(s): LIPASE, AMYLASE in the last 72 hours. Cardiac Enzymes No results for input(s): CKTOTAL, CKMB, CKMBINDEX, TROPONINI in the last 72 hours. BNP Invalid input(s): POCBNP D-Dimer No results for input(s): DDIMER in the last 72 hours. Hemoglobin A1C No results for input(s): HGBA1C in the last 72 hours. Fasting Lipid Panel No results for input(s): CHOL, HDL, LDLCALC, TRIG, CHOLHDL, LDLDIRECT in the last 72 hours. Thyroid Function Tests No results for input(s): TSH, T4TOTAL, T3FREE, THYROIDAB in the last 72 hours.  Invalid input(s): FREET3  Telemetry    V-paced, HR in 60's - 70's.   ECG    No new tracings.    Cardiac Studies and Radiology    US Renal  Result Date: 09/27/2015 CLINICAL DATA:  Acute renal failure. EXAM: RENAL / URINARY TRACT ULTRASOUND COMPLETE COMPARISON:  08/07/2015 FINDINGS: Right Kidney:  Length: 9.9 cm. Cortical thinning noted with slight increased cortical echogenicity. No hydronephrosis. Left Kidney: Length: 11.5 cm multiple simple appearing cysts are identified, as before. No hydronephrosis. Echogenicity within normal limits. No mass or hydronephrosis visualized. Bladder: Appears normal for degree of bladder distention. Other:  Small volume ascites noted with 2.8 cm hepatic cyst evident. IMPRESSION: No evidence for hydronephrosis. Electronically Signed   By: Kennith Center M.D.   On: 09/27/2015 10:30   Dg Chest Port 1 View  Result Date: 09/25/2015 CLINICAL DATA:  82 year old male with shortness of breath. EXAM: PORTABLE CHEST 1 VIEW COMPARISON:  Chest radiograph dated 09/24/2015 FINDINGS: There is stable moderate cardiomegaly with minimal central vascular prominence. There is no focal consolidation, pleural effusion, or pneumothorax. Right apical pleural thickening. Left pectoral AICD device and median sternotomy wires noted. There is osteopenia with degenerative changes of the spine. Bilateral shoulder arthroplasty. No acute fracture. IMPRESSION: Cardiomegaly with probable mild congestive changes. No focal consolidation or edema. Electronically Signed   By: Elgie Collard M.D.   On: 09/25/2015 02:59   Dg Chest Port 1 View  Result Date: 09/24/2015 CLINICAL DATA:  Dyspnea EXAM: PORTABLE CHEST 1 VIEW COMPARISON:  08/12/2015 FINDINGS: Cardiomegaly again noted. Status post median sternotomy. 3 leads cardiac pacemaker is unchanged in position. No infiltrate or pulmonary edema. Bilateral shoulder prosthesis again noted. IMPRESSION: Cardiomegaly. No active disease. Cardiac pacemaker unchanged in position. Electronically Signed   By: Natasha Mead M.D.   On: 09/24/2015 20:24    Echocardiogram: 08/06/2015  Study Conclusions  - Left ventricle: The cavity size was moderately dilated. Systolic   function was severely reduced. The estimated ejection fraction   was in the range of 25% to 30%.  Diffuse hypokinesis. Severe   hypokinesis, possible akinesis of the inferior/posterior   myocardium. Doppler parameters are consistent with abnormal left   ventricular relaxation. - Aorta: Aortic root is mildly dilated, dimension: 37 mm (ED). - Mitral valve: There was moderate regurgitation. - Left atrium: The atrium was moderately to severely dilated. - Right ventricle: The cavity size was moderately dilated. Wall   thickness was normal. Systolic function was moderately reduced. - Tricuspid valve: There was moderate regurgitation. - Pulmonary arteries: Systolic pressure was moderately elevated. PA   peak pressure: 58 mm Hg (S). - Inferior vena cava: The vessel was dilated. The respirophasic   diameter changes were blunted (< 50%), consistent with elevated   central venous pressure.  Assessment & Plan    1. VT - Appropriate ICD discharge, thought to be scar medicated. No further sustained VT noted on telemetry  while admitted. - started on IV Amiodarone at time of admission. Has been transitioned to PO Amiodarone 400 mg BID, likely home eventually on 200 mg BID.  - has been started on low-dose BB (hold parameters in place with recent episodes of hypotension).  2. CAD - s/p CABG in 1995. Had LHC in 07/2015 with no options for revascularization. Would not repeat cath (especially with AKI).  - cyclic troponin values have been elevated at 0.05, 1.18, and 0.89. Thought to be secondary to demand ischemia with volume overload and ICD discharge.  - Continue Plavix, statin, and recently started BB.   3. Acute on chronic systolic CHF - EF 25-30% by echo in 7/17, secondary to ischemic cardiomyopathy. RHC in 7/17 showed low cardiac output. Seen by Dr. Shirlee Latch and thought to have low output heart failure with a component of cardiorenal syndrome. Recommend trying to avoid Milrinone gtt use with recent sustained VT.  - No Entresto with low SBP. No ACE-I/ABR/Spironolactone with AKI.  - resumed on  PO Lasix 40mg  BID on 9/11, with minimal output of 550 cc. Discussed with Dr. 11/11, will start low-dose Dopamine infusion to hopefully assist with urine output.   4. Acute on Chronic Stage 3 CKD  - Most likely cardiorenal syndrome in the setting of known low cardiac output based on RHC in 7/17.  - creatinine 1.77 on admission, elevated to 3.54 on 09/29/2015. - Nephrology following. Patient and family made aware of possible need for HD. Palliative Care has been consulted to assist with goals of care.   5. Deconditioning - evaluated by PT --> Home Health PT recommended.  Signed, 11/29/2015 , PA-C 12:43 PM 09/29/2015 Pager: [phone]

## 2015-09-29 NOTE — Progress Notes (Addendum)
[facility] - [place] at [facility]   PATIENT NAME: Tom Nguyen    MR#:  [id number]  DATE OF BIRTH:  [birth year]  SUBJECTIVE:  CHIEF COMPLAINT:   Chief Complaint  Patient presents with  . Chest Pain   Worsening shortness of breath overnight No pain.  REVIEW OF SYSTEMS:    Review of Systems  Constitutional: Negative for chills and fever.  HENT: Negative for sore throat.   Eyes: Negative for blurred vision, double vision and pain.  Respiratory: Negative for cough, hemoptysis, shortness of breath and wheezing.   Cardiovascular: Negative for chest pain, palpitations, orthopnea and leg swelling.  Gastrointestinal: Negative for abdominal pain, constipation, diarrhea, heartburn, nausea and vomiting.  Genitourinary: Negative for dysuria and hematuria.  Musculoskeletal: Negative for back pain and joint pain.  Skin: Negative for rash.  Neurological: Negative for sensory change, speech change, focal weakness and headaches.  Endo/Heme/Allergies: Does not bruise/bleed easily.  Psychiatric/Behavioral: Negative for depression. The patient is not nervous/anxious.     DRUG ALLERGIES:  No Known Allergies  VITALS:  Blood pressure (!) 95/51, pulse 62, temperature 97.8 F (36.6 C), temperature source Oral, resp. rate 12, height 5\' 8"  (1.727 m), weight 80.9 kg (178 lb 6.4 oz), SpO2 100 %.  PHYSICAL EXAMINATION:   Physical Exam  GENERAL:  82 y.o.-year-old patient lying in the bed with no acute distress.  EYES: Pupils equal, round, reactive to light and accommodation. No scleral icterus. Extraocular muscles intact.  HEENT: Head atraumatic, normocephalic. Oropharynx and nasopharynx clear.  NECK:  Supple, no jugular venous distention. No thyroid enlargement, no tenderness.  LUNGS: Normal work of breathing. Bibasilar crackles. no wheezing, rhonchi. No use of accessory muscles of respiration.  CARDIOVASCULAR: S1, S2 normal. No murmurs, rubs, or gallops.  ABDOMEN: Soft,  nontender, nondistended. Bowel sounds present. No organomegaly or mass.  EXTREMITIES: No cyanosis, clubbing or edema b/l.    NEUROLOGIC: Cranial nerves II through XII are intact. No focal Motor or sensory deficits b/l.   PSYCHIATRIC: The patient is alert and oriented x 3.  SKIN: No obvious rash, lesion, or ulcer.   LABORATORY PANEL:   CBC  Recent Labs Lab 09/26/15 0449  WBC 8.1  HGB 11.5*  HCT 33.1*  PLT 101*   ------------------------------------------------------------------------------------------------------------------ Chemistries   Recent Labs Lab 09/24/15 1927  09/26/15 0449  09/29/15 0459  NA 136  < > 133*  < > 133*  K 4.2  < > 4.4  < > 4.0  CL 101  < > 101  < > 101  CO2 21*  < > 19*  < > 22  GLUCOSE 133*  < > 101*  < > 96  BUN 32*  < > 41*  < > 58*  CREATININE 1.77*  < > 2.73*  < > 3.54*  CALCIUM 8.9  < > 8.1*  < > 8.3*  MG  --   < > 2.1  --   --   AST 42*  --   --   --   --   ALT 26  --   --   --   --   ALKPHOS 97  --   --   --   --   BILITOT 1.6*  --   --   --   --   < > = values in this interval not displayed. ------------------------------------------------------------------------------------------------------------------  Cardiac Enzymes  Recent Labs Lab 09/25/15 1722  TROPONINI 0.89*   ------------------------------------------------------------------------------------------------------------------  RADIOLOGY:  No results found.  ASSESSMENT AND PLAN:   * AKI over CKD - worsening Likely cardiorenal syndrome or ATN Discussed with Dr. Wynelle Link with nephrology. Continue oral Lasix 40 twice a day. Monitor I/Os. Renal ultrasound showed no obstruction Repeat labs in the morning. Likely needs dialysis soon if any further worsening or poor urine output  * Acute on chronic systolic CHF Start Lasix oral. Received 1 dose of IV Lasix 09/27/2015. Daily weight  * Sustained VT - s/p cardioversion A meter dose increased. Stable Seen by  Cardiology/EP. Appreciate help.  * CAD Elevated troponin likely due to demand ischemia being in ventricular tachycardia and status post cardioversion.  *Hypothyroidism On levothyroxine  * Chronic respiratory failure with COPD Stable  All the records are reviewed and case discussed with Care Management/Social Workerr. Management plans discussed with the patient, family and they are in agreement.  CODE STATUS: FULL CODE  Advance care planning  Discussed with patient and his wife at bedside regarding goals of care. Patient has been progressively getting worse. He does understand he has a weak heart. He is hoping he will not need dialysis but would consider it as a temporary option.  Healthcare power of attorney is his wife. On discussing with wife she mentions that they do not want any kids involved healthcare decisions at this time, a wide burdening them and also to stay independent. Wife does not want to give up yet and wants to see how patient does with this hospitalization.. She is still unhappy that in the past patient was discharged home as they could not help him any further. She states take to all tubes and wires out and I asked him to go home. She also requested to talk to Dr. Mariah Milling regarding further prognosis with his heart. Patient continues to be full code. Time spent > 15 minutes  DVT Prophylaxis: SCDs  TOTAL TIME TAKING CARE OF THIS PATIENT: 30 minutes.   POSSIBLE D/C IN 1-2 DAYS, DEPENDING ON CLINICAL CONDITION.  Milagros Loll R M.D on 09/29/2015 at 12:13 PM  Between 7am to 6pm - Pager - [phone]  After 6pm go to [email or web address] - password [email or web address]  [facility]  Office  [phone]  CC: Primary care physician; Olevia Perches, DO  Note: This dictation was prepared with Dragon dictation along with smaller phrase technology. Any transcriptional errors that result from this process are unintentional.

## 2015-09-29 NOTE — Progress Notes (Signed)
Patient is resting in bed at this time wife at bed side , in no acute distress, vs stable at this time will continue to monitor , endorsed to nurse

## 2015-09-29 NOTE — Plan of Care (Signed)
Problem: Physical Regulation: Goal: Ability to maintain clinical measurements within normal limits will improve Outcome: Progressing Increased urine output

## 2015-09-29 NOTE — Progress Notes (Signed)
DR Elpidio Anis was made aware of pt's  Left side chest pain 10/10 with bp 96/53, stated to given pt's 1 mg of morphine  And continue to monitor

## 2015-09-29 NOTE — Evaluation (Signed)
Physical Therapy Evaluation Patient Details Name: Tom Nguyen MRN: [id number] DOB: 12/08/1932 Today's Date: 09/29/2015   History of Present Illness  Tom Nguyen is an 82 year old male with past medical history of Asthma,CAD, CHF- Class 3,COPD, s/p AICD, Hypertension, Hyperlipidemia, MI, and sleep apnea. Patient started experiencing chest pain and shortness of breath at approximately 6 pm on 9/7 with untolerable headache. Patient took two Nitroglycerine tablet, but his chest pain was unrelieved therefore he came to [facility] ED on 9/7. In the ED the patient was in V-tach and was symptomatic. Patient was defibrillated with 150 joules. Patient was started on amiodarone. Patient is followed by Dr. Mariah Milling . CCM team was called To admit the patient. One reported falls in the last 12 months.   Clinical Impression  Pt admitted with above diagnosis. Pt currently with functional limitations due to the deficits listed below (see PT Problem List). Pt demonstrates decreased LE strength with MMT and sit to stand transfers. He is able to ambulate a full lap around RN station on room air while SaO2 remains at or above 95%. Pt reports mild DOE and RR increases with ambulation. HR remains within normal limits for ambulation. Pt is somewhat unsteady but easily correct with rolling walker which patient uses at baseline. He is safe to return home with [facility] PT. Pt will benefit from skilled PT services to address deficits in strength, balance, and mobility in order to return to full function at home.     Follow Up Recommendations Home health PT    Equipment Recommendations  None recommended by PT    Recommendations for Other Services       Precautions / Restrictions Precautions Precautions: Fall Restrictions Weight Bearing Restrictions: No      Mobility  Bed Mobility               General bed mobility comments: Received upright in recliner and left in chair at end of session. Not  assessed  Transfers Overall transfer level: Needs assistance Equipment used: Rolling walker (2 wheeled) Transfers: Sit to/from Stand Sit to Stand: Min guard         General transfer comment: Pt requires increased time to come to standing due to LE weakness. Once upright stable and standing with UE support. Safe hand placement noted  Ambulation/Gait Ambulation/Gait assistance: Min guard;+2 safety/equipment Ambulation Distance (Feet): 275 Feet Assistive device: Rolling walker (2 wheeled) Gait Pattern/deviations: Decreased step length - right;Decreased step length - left Gait velocity: Decreased Gait velocity interpretation: <1.8 ft/sec, indicative of risk for recurrent falls General Gait Details: Intermittent LE buckling but self-corrected. Pt appears mildly unsteady but able to correct with rolling walker. Vitals monitored and SaO2 remains at or above 95% on room air during ambulation. HR remains WNL. Pt reports mild DOE. Increased RR noted during ambulation and after returning to chair. Supplemental O2 reapplied at end of ambulation for comfort  Stairs            Wheelchair Mobility    Modified Rankin (Stroke Patients Only)       Balance Overall balance assessment: Needs assistance Sitting-balance support: No upper extremity supported Sitting balance-Leahy Scale: Good     Standing balance support: No upper extremity supported Standing balance-Leahy Scale: Fair Standing balance comment: Able to maintain static standing balance without UE support                             Pertinent Vitals/Pain Pain Assessment:  No/denies pain    Home Living Family/patient expects to be discharged to:: Private residence Living Arrangements: Spouse/significant other Available Help at Discharge: Family Type of Home: House Home Access: Stairs to enter Entrance Stairs-Rails: Right;Left;Can reach both Entrance Stairs-Number of Steps: 2 Home Layout: Two level;Able to live  on main level with bedroom/bathroom Home Equipment: Dan Humphreys - 2 wheels;Walker - 4 wheels;Cane - single point;Shower seat;Wheelchair - manual;Other (comment);Grab bars - tub/shower;Bedside commode (Adjustable bed, no grab bars near commode) Additional Comments: 1    Prior Function Level of Independence: Needs assistance   Gait / Transfers Assistance Needed: Ambulates with spc limited community distances. Uses electric shopping cart in grocery store  ADL's / Homemaking Assistance Needed: Intermittent assist with bathing from wife  Comments: Has home O2 but only wears at night with CPAP.     Hand Dominance   Dominant Hand: Left    Extremity/Trunk Assessment   Upper Extremity Assessment: Generalized weakness (History of bilateral shoulder surgery, limited flexion AROM)           Lower Extremity Assessment: Generalized weakness (Focal L ankle DF weakness noted. chronic?)         Communication   Communication: No difficulties  Cognition Arousal/Alertness: Awake/alert Behavior During Therapy: WFL for tasks assessed/performed Overall Cognitive Status: Within Functional Limits for tasks assessed                      General Comments      Exercises        Assessment/Plan    PT Assessment Patient needs continued PT services  PT Diagnosis Difficulty walking;Abnormality of gait;Generalized weakness   PT Problem List Decreased strength;Decreased activity tolerance;Decreased balance;Cardiopulmonary status limiting activity  PT Treatment Interventions Gait training;Stair training;Therapeutic activities;Therapeutic exercise;Balance training;Neuromuscular re-education;Patient/family education   PT Goals (Current goals can be found in the Care Plan section) Acute Rehab PT Goals Patient Stated Goal: Return to prior level of function at home PT Goal Formulation: With patient/family Time For Goal Achievement: 10/13/15 Potential to Achieve Goals: Good    Frequency Min  2X/week   Barriers to discharge        Co-evaluation               End of Session Equipment Utilized During Treatment: Gait belt;Oxygen Activity Tolerance: Patient tolerated treatment well Patient left: in chair;with call bell/phone within reach;with chair alarm set Nurse Communication: Mobility status         Time: 0160-1093 PT Time Calculation (min) (ACUTE ONLY): 24 min   Charges:   PT Evaluation $PT Eval Moderate Complexity: 1 Procedure PT Treatments $Gait Training: 8-22 mins   PT G Codes:       Sharalyn Ink Huprich PT, DPT   Huprich,Jason 09/29/2015, 11:57 AM

## 2015-09-29 NOTE — Progress Notes (Signed)
[facility]  ROUNDING NOTE   Subjective:   Wife at bedside.  UOP 350 Na 133 Furosemide 40mg  BID  Creatinine 3.54 (3.52)  Objective:  Vital signs in last 24 hours:  Temp:  [97.5 F (36.4 C)-98.5 F (36.9 C)] 97.5 F (36.4 C) (09/12 0816) Pulse Rate:  [59-70] 68 (09/12 0816) Resp:  [12-18] 18 (09/12 0816) BP: (94-116)/(49-60) 111/57 (09/12 0816) SpO2:  [92 %-97 %] 93 % (09/12 0816) Weight:  [80.9 kg (178 lb 6.4 oz)] 80.9 kg (178 lb 6.4 oz) (09/12 0348)  Weight change: 0.59 kg (1 lb 4.8 oz) Filed Weights   09/27/15 0549 09/28/15 0508 09/29/15 0348  Weight: 82.3 kg (181 lb 8 oz) 80.3 kg (177 lb 1.6 oz) 80.9 kg (178 lb 6.4 oz)    Intake/Output: I/O last 3 completed shifts: In: 720 [P.O.:720] Out: 1100 [Urine:1100]   Intake/Output this shift:  Total I/O In: -  Out: 275 [Urine:275]  Physical Exam: General: No acute distress  Head: Normocephalic, atraumatic. Moist oral mucosal membranes  Eyes: Anicteric  Neck: Supple, trachea midline  Lungs:  Basilar rales, normal effort  Heart: S1S2 no rubs  Abdomen:  Soft, nontender, BS present  Extremities: 1+ peripheral edema.  Neurologic: Nonfocal, moving all four extremities  Skin: No lesions  Access:     Basic Metabolic Panel:  Recent Labs Lab 09/25/15 0555 09/26/15 0449 09/27/15 0702 09/28/15 0543 09/29/15 0459  NA 134* 133* 131* 133* 133*  K 4.3 4.4 4.4 4.2 4.0  CL 103 101 99* 99* 101  CO2 20* 19* 19* 22 22  GLUCOSE 125* 101* 91 93 96  BUN 33* 41* 48* 52* 58*  CREATININE 2.05* 2.73* 3.16* 3.52* 3.54*  CALCIUM 8.2* 8.1* 8.1* 8.2* 8.3*  MG 2.2 2.1  --   --   --   PHOS 3.5 4.0 4.1 4.3 4.1    Liver Function Tests:  Recent Labs Lab 09/24/15 1927  AST 42*  ALT 26  ALKPHOS 97  BILITOT 1.6*  PROT 6.5  ALBUMIN 3.6   No results for input(s): LIPASE, AMYLASE in the last 168 hours. No results for input(s): AMMONIA in the last 168 hours.  CBC:  Recent Labs Lab 09/24/15 1927 09/25/15 0555  09/26/15 0449  WBC 7.5 8.3 8.1  HGB 11.1* 11.5* 11.5*  HCT 33.2* 33.9* 33.1*  MCV 95.6 94.7 92.9  PLT 120* 109* 101*    Cardiac Enzymes:  Recent Labs Lab 09/24/15 1927 09/25/15 0900 09/25/15 1722 09/26/15 0449  CKTOTAL  --   --   --  58  TROPONINI 0.05* 1.18* 0.89*  --     BNP: Invalid input(s): POCBNP  CBG:  Recent Labs Lab 09/24/15 2226  GLUCAP 142*    Microbiology: Results for orders placed or performed during the hospital encounter of 09/24/15  MRSA PCR Screening     Status: None   Collection Time: 09/24/15 10:26 PM  Result Value Ref Range Status   MRSA by PCR NEGATIVE NEGATIVE Final    Comment:        The GeneXpert MRSA Assay (FDA approved for NASAL specimens only), is one component of a comprehensive MRSA colonization surveillance program. It is not intended to diagnose MRSA infection nor to guide or monitor treatment for MRSA infections.     Coagulation Studies: No results for input(s): LABPROT, INR in the last 72 hours.  Urinalysis: No results for input(s): COLORURINE, LABSPEC, PHURINE, GLUCOSEU, HGBUR, BILIRUBINUR, KETONESUR, PROTEINUR, UROBILINOGEN, NITRITE, LEUKOCYTESUR in the last 72 hours.  Invalid input(s): APPERANCEUR    Imaging: No results found.   Medications:     . amiodarone  400 mg Oral BID  . carvedilol  3.125 mg Oral BID WC  . clopidogrel  75 mg Oral Daily  . famotidine  20 mg Oral Daily  . furosemide  40 mg Oral BID  . levothyroxine  75 mcg Oral QAC breakfast  . montelukast  10 mg Oral QHS  . simvastatin  40 mg Oral q1800  . sodium chloride flush  3 mL Intravenous Q12H   sodium chloride, sodium chloride, acetaminophen, ipratropium-albuterol, morphine injection, ondansetron (ZOFRAN) IV, sodium chloride flush  Assessment/ Plan:  82 y.o.white male with COPD, rheumatoid arthritis, hypertension, coronary artery disease status post CABG, systolic congestive heart failure, pacemaker/AICD  1. Acute renal failure on  chronic kidney disease stage III Baseline creatinine 1.4 09/01/15  Acute renal failure from acute cardiorenal syndrome versus ATN.  - Furosemide PO 40mg  bid - Discussed dialysis with patient and family.   2.  Hyponatremia: secondary to congestive heart failure. Stable.  - furosemide as above.   3. Acute systolic congestive heart failure. Ejection fraction 25-30%.  - furosemide PO 40mg  bid   LOS: 5 Shelsey Rieth 9/12/201710:29 AM

## 2015-09-29 NOTE — Progress Notes (Signed)
MD notified. Pt is due to receive coreg and lasix. BP is low. MD orders to hold the dose of coreg and lasix. I will continue to assess.

## 2015-09-29 NOTE — Progress Notes (Signed)
Contacted by nursing about episodes of sustained VT Patient was on low dose dopamine between 2 and 5ugs Patient was sleeping, developed sustained VT Nurses reported heart rate 140s  No ICD shock delivered, ATP therapy delivered Dopamine held, eventually converted back to normal sinus rhythm after 8 minutes  After event patient did develop chest pain symptoms, was given morphine with improvement of his symptoms Was hypotensive during episode of VT  On clinical exam, heart sounds regular, lungs are clear with dullness at the bases, abdomen soft nontender, no leg edema, on oxygen nasal cannula  Lab work reviewed with creatinine 3.5  --- Acute on chronic systolic CHF Limited options given arrhythmia on minimal dose dopamine Unable to use inotropes at this time including dopamine, dobutamine, milrinone given arrhythmia/sustained VT Only other option would be dialysis, though may be difficult in the setting of hypotension Palliative care to discuss CODE STATUS  ----Sustained VT discuss again with Dr. Graciela Husbands, Slow VT Not reaching threshold, shock not delivered Will avoid inotropes for now   Total encounter time more than 60 minutes  Greater than 50% was spent in counseling and coordination of care with the patient  Signed, Dossie Arbour, MD, Ph.D [facility]

## 2015-09-30 ENCOUNTER — Other Ambulatory Visit: Payer: Self-pay | Admitting: Student

## 2015-09-30 ENCOUNTER — Inpatient Hospital Stay (HOSPITAL_COMMUNITY)
Admission: AD | Admit: 2015-09-30 | Discharge: 2015-10-11 | DRG: 308 | Disposition: A | Discharge status: Hospice | Payer: Medicare Other | Source: Other Acute Inpatient Hospital | Attending: Cardiology | Admitting: Cardiology

## 2015-09-30 ENCOUNTER — Inpatient Hospital Stay (HOSPITAL_COMMUNITY): Payer: Medicare Other

## 2015-09-30 DIAGNOSIS — I472 Ventricular tachycardia, unspecified: Secondary | ICD-10-CM

## 2015-09-30 DIAGNOSIS — I252 Old myocardial infarction: Secondary | ICD-10-CM

## 2015-09-30 DIAGNOSIS — R570 Cardiogenic shock: Secondary | ICD-10-CM | POA: Diagnosis present

## 2015-09-30 DIAGNOSIS — I130 Hypertensive heart and chronic kidney disease with heart failure and stage 1 through stage 4 chronic kidney disease, or unspecified chronic kidney disease: Secondary | ICD-10-CM | POA: Diagnosis present

## 2015-09-30 DIAGNOSIS — I248 Other forms of acute ischemic heart disease: Secondary | ICD-10-CM | POA: Diagnosis present

## 2015-09-30 DIAGNOSIS — E785 Hyperlipidemia, unspecified: Secondary | ICD-10-CM | POA: Diagnosis not present

## 2015-09-30 DIAGNOSIS — Z515 Encounter for palliative care: Secondary | ICD-10-CM | POA: Diagnosis present

## 2015-09-30 DIAGNOSIS — I959 Hypotension, unspecified: Secondary | ICD-10-CM | POA: Diagnosis present

## 2015-09-30 DIAGNOSIS — J9610 Chronic respiratory failure, unspecified whether with hypoxia or hypercapnia: Secondary | ICD-10-CM | POA: Diagnosis not present

## 2015-09-30 DIAGNOSIS — I5023 Acute on chronic systolic (congestive) heart failure: Secondary | ICD-10-CM | POA: Diagnosis present

## 2015-09-30 DIAGNOSIS — R34 Anuria and oliguria: Secondary | ICD-10-CM | POA: Diagnosis not present

## 2015-09-30 DIAGNOSIS — Z951 Presence of aortocoronary bypass graft: Secondary | ICD-10-CM | POA: Diagnosis not present

## 2015-09-30 DIAGNOSIS — I255 Ischemic cardiomyopathy: Secondary | ICD-10-CM | POA: Diagnosis present

## 2015-09-30 DIAGNOSIS — M069 Rheumatoid arthritis, unspecified: Secondary | ICD-10-CM | POA: Diagnosis present

## 2015-09-30 DIAGNOSIS — Z7189 Other specified counseling: Secondary | ICD-10-CM | POA: Diagnosis not present

## 2015-09-30 DIAGNOSIS — E871 Hypo-osmolality and hyponatremia: Secondary | ICD-10-CM | POA: Diagnosis not present

## 2015-09-30 DIAGNOSIS — Z87891 Personal history of nicotine dependence: Secondary | ICD-10-CM

## 2015-09-30 DIAGNOSIS — I447 Left bundle-branch block, unspecified: Secondary | ICD-10-CM | POA: Diagnosis present

## 2015-09-30 DIAGNOSIS — N178 Other acute kidney failure: Secondary | ICD-10-CM | POA: Diagnosis not present

## 2015-09-30 DIAGNOSIS — Z66 Do not resuscitate: Secondary | ICD-10-CM | POA: Diagnosis not present

## 2015-09-30 DIAGNOSIS — G4730 Sleep apnea, unspecified: Secondary | ICD-10-CM | POA: Diagnosis present

## 2015-09-30 DIAGNOSIS — N183 Chronic kidney disease, stage 3 (moderate): Secondary | ICD-10-CM | POA: Diagnosis not present

## 2015-09-30 DIAGNOSIS — N170 Acute kidney failure with tubular necrosis: Secondary | ICD-10-CM | POA: Diagnosis present

## 2015-09-30 DIAGNOSIS — E876 Hypokalemia: Secondary | ICD-10-CM | POA: Diagnosis not present

## 2015-09-30 DIAGNOSIS — Z79899 Other long term (current) drug therapy: Secondary | ICD-10-CM

## 2015-09-30 DIAGNOSIS — Z23 Encounter for immunization: Secondary | ICD-10-CM | POA: Diagnosis not present

## 2015-09-30 DIAGNOSIS — J449 Chronic obstructive pulmonary disease, unspecified: Secondary | ICD-10-CM | POA: Diagnosis present

## 2015-09-30 DIAGNOSIS — Z6827 Body mass index (BMI) 27.0-27.9, adult: Secondary | ICD-10-CM

## 2015-09-30 DIAGNOSIS — Z96611 Presence of right artificial shoulder joint: Secondary | ICD-10-CM | POA: Diagnosis present

## 2015-09-30 DIAGNOSIS — K921 Melena: Secondary | ICD-10-CM | POA: Diagnosis present

## 2015-09-30 DIAGNOSIS — Z452 Encounter for adjustment and management of vascular access device: Secondary | ICD-10-CM

## 2015-09-30 DIAGNOSIS — N179 Acute kidney failure, unspecified: Secondary | ICD-10-CM | POA: Diagnosis not present

## 2015-09-30 DIAGNOSIS — I480 Paroxysmal atrial fibrillation: Secondary | ICD-10-CM | POA: Diagnosis not present

## 2015-09-30 DIAGNOSIS — Z96653 Presence of artificial knee joint, bilateral: Secondary | ICD-10-CM | POA: Diagnosis present

## 2015-09-30 DIAGNOSIS — I5043 Acute on chronic combined systolic (congestive) and diastolic (congestive) heart failure: Secondary | ICD-10-CM | POA: Diagnosis present

## 2015-09-30 DIAGNOSIS — E039 Hypothyroidism, unspecified: Secondary | ICD-10-CM | POA: Diagnosis present

## 2015-09-30 DIAGNOSIS — Z9981 Dependence on supplemental oxygen: Secondary | ICD-10-CM

## 2015-09-30 DIAGNOSIS — E669 Obesity, unspecified: Secondary | ICD-10-CM | POA: Diagnosis present

## 2015-09-30 DIAGNOSIS — I272 Other secondary pulmonary hypertension: Secondary | ICD-10-CM | POA: Diagnosis present

## 2015-09-30 DIAGNOSIS — D631 Anemia in chronic kidney disease: Secondary | ICD-10-CM | POA: Diagnosis present

## 2015-09-30 DIAGNOSIS — I2510 Atherosclerotic heart disease of native coronary artery without angina pectoris: Secondary | ICD-10-CM | POA: Diagnosis not present

## 2015-09-30 DIAGNOSIS — Z95810 Presence of automatic (implantable) cardiac defibrillator: Secondary | ICD-10-CM

## 2015-09-30 DIAGNOSIS — Z841 Family history of disorders of kidney and ureter: Secondary | ICD-10-CM

## 2015-09-30 DIAGNOSIS — N171 Acute kidney failure with acute cortical necrosis: Secondary | ICD-10-CM | POA: Diagnosis not present

## 2015-09-30 DIAGNOSIS — Z96612 Presence of left artificial shoulder joint: Secondary | ICD-10-CM | POA: Diagnosis present

## 2015-09-30 DIAGNOSIS — Z823 Family history of stroke: Secondary | ICD-10-CM

## 2015-09-30 DIAGNOSIS — I25810 Atherosclerosis of coronary artery bypass graft(s) without angina pectoris: Secondary | ICD-10-CM | POA: Diagnosis present

## 2015-09-30 DIAGNOSIS — Z7902 Long term (current) use of antithrombotics/antiplatelets: Secondary | ICD-10-CM

## 2015-09-30 DIAGNOSIS — Z7983 Long term (current) use of bisphosphonates: Secondary | ICD-10-CM

## 2015-09-30 DIAGNOSIS — I4901 Ventricular fibrillation: Secondary | ICD-10-CM | POA: Diagnosis present

## 2015-09-30 DIAGNOSIS — Z8249 Family history of ischemic heart disease and other diseases of the circulatory system: Secondary | ICD-10-CM

## 2015-09-30 DIAGNOSIS — R079 Chest pain, unspecified: Secondary | ICD-10-CM | POA: Diagnosis present

## 2015-09-30 DIAGNOSIS — J432 Centrilobular emphysema: Secondary | ICD-10-CM | POA: Diagnosis present

## 2015-09-30 LAB — CARBOXYHEMOGLOBIN
CARBOXYHEMOGLOBIN: 1.4 % (ref 0.5–1.5)
METHEMOGLOBIN: 0.9 % (ref 0.0–1.5)
O2 SAT: 64.2 %
Total hemoglobin: 10.2 g/dL — ABNORMAL LOW (ref 12.0–16.0)

## 2015-09-30 LAB — BASIC METABOLIC PANEL
Anion gap: 11 (ref 5–15)
BUN: 65 mg/dL — AB (ref 6–20)
CHLORIDE: 99 mmol/L — AB (ref 101–111)
CO2: 20 mmol/L — AB (ref 22–32)
CREATININE: 3.73 mg/dL — AB (ref 0.61–1.24)
Calcium: 8.3 mg/dL — ABNORMAL LOW (ref 8.9–10.3)
GFR calc Af Amer: 16 mL/min — ABNORMAL LOW (ref >60)
GFR calc non Af Amer: 14 mL/min — ABNORMAL LOW (ref >60)
GLUCOSE: 122 mg/dL — AB (ref 65–99)
POTASSIUM: 4.4 mmol/L (ref 3.5–5.1)
Sodium: 130 mmol/L — ABNORMAL LOW (ref 135–145)

## 2015-09-30 LAB — PHOSPHORUS: Phosphorus: 4 mg/dL (ref 2.5–4.6)

## 2015-09-30 LAB — CREATININE, URINE, RANDOM: CREATININE, URINE: 94.52 mg/dL

## 2015-09-30 LAB — SODIUM, URINE, RANDOM

## 2015-09-30 MED ORDER — FUROSEMIDE 10 MG/ML IJ SOLN: 80.0000 mg | Freq: Three times a day (TID) | INTRAMUSCULAR | Status: DC

## 2015-09-30 MED ORDER — MILRINONE LACTATE IN DEXTROSE 20-5 MG/100ML-% IV SOLN
0.1250 ug/kg/min | INTRAVENOUS | Status: DC
  Administered 2015-09-30 – 2015-10-08 (×6): 0.125 ug/kg/min via INTRAVENOUS
  Filled 2015-09-30 (×9): qty 100

## 2015-09-30 MED ORDER — CARVEDILOL 3.125 MG PO TABS
3.1250 mg | ORAL_TABLET | Freq: Two times a day (BID) | ORAL | Status: DC
  Administered 2015-09-30 – 2015-10-07 (×15): 3.125 mg via ORAL
  Filled 2015-09-30 (×16): qty 1

## 2015-09-30 MED ORDER — ALLOPURINOL 100 MG PO TABS
100.0000 mg | ORAL_TABLET | Freq: Every day | ORAL | Status: DC
  Administered 2015-10-01 – 2015-10-11 (×11): 100 mg via ORAL
  Filled 2015-09-30 (×12): qty 1

## 2015-09-30 MED ORDER — HEPARIN SODIUM (PORCINE) 5000 UNIT/ML IJ SOLN
5000.0000 [IU] | Freq: Three times a day (TID) | INTRAMUSCULAR | Status: DC
  Administered 2015-09-30 – 2015-10-09 (×28): 5000 [IU] via SUBCUTANEOUS
  Filled 2015-09-30 (×28): qty 1

## 2015-09-30 MED ORDER — SIMVASTATIN 40 MG PO TABS
40.0000 mg | ORAL_TABLET | Freq: Every day | ORAL | Status: DC
  Administered 2015-09-30 – 2015-10-08 (×9): 40 mg via ORAL
  Filled 2015-09-30 (×9): qty 1

## 2015-09-30 MED ORDER — ORAL CARE MOUTH RINSE
15.0000 mL | Freq: Two times a day (BID) | OROMUCOSAL | Status: DC
  Administered 2015-09-30 – 2015-10-11 (×16): 15 mL via OROMUCOSAL

## 2015-09-30 MED ORDER — UMECLIDINIUM BROMIDE 62.5 MCG/INH IN AEPB
1.0000 | INHALATION_SPRAY | Freq: Every day | RESPIRATORY_TRACT | Status: DC
  Administered 2015-10-01 – 2015-10-09 (×9): 1 via RESPIRATORY_TRACT
  Filled 2015-09-30 (×3): qty 7

## 2015-09-30 MED ORDER — MONTELUKAST SODIUM 10 MG PO TABS
10.0000 mg | ORAL_TABLET | Freq: Every day | ORAL | Status: DC
  Administered 2015-10-01 – 2015-10-11 (×11): 10 mg via ORAL
  Filled 2015-09-30 (×11): qty 1

## 2015-09-30 MED ORDER — SODIUM CHLORIDE 0.9% FLUSH
3.0000 mL | Freq: Two times a day (BID) | INTRAVENOUS | Status: DC
  Administered 2015-09-30 – 2015-10-09 (×13): 3 mL via INTRAVENOUS

## 2015-09-30 MED ORDER — AMIODARONE HCL 200 MG PO TABS: 400.0000 mg | ORAL_TABLET | Freq: Two times a day (BID) | ORAL | Status: DC

## 2015-09-30 MED ORDER — LEFLUNOMIDE 20 MG PO TABS
20.0000 mg | ORAL_TABLET | Freq: Every day | ORAL | Status: DC
  Administered 2015-09-30 – 2015-10-11 (×12): 20 mg via ORAL
  Filled 2015-09-30 (×13): qty 1

## 2015-09-30 MED ORDER — SODIUM CHLORIDE 0.9 % IV SOLN
250.0000 mL | INTRAVENOUS | Status: DC | PRN
  Administered 2015-09-30 – 2015-10-03 (×2): 250 mL via INTRAVENOUS

## 2015-09-30 MED ORDER — IPRATROPIUM-ALBUTEROL 0.5-2.5 (3) MG/3ML IN SOLN
3.0000 mL | Freq: Three times a day (TID) | RESPIRATORY_TRACT | Status: DC
  Administered 2015-09-30 – 2015-10-01 (×3): 3 mL via RESPIRATORY_TRACT
  Filled 2015-09-30 (×3): qty 3

## 2015-09-30 MED ORDER — ACETAMINOPHEN 325 MG PO TABS
650.0000 mg | ORAL_TABLET | ORAL | Status: DC | PRN
  Administered 2015-10-08: 650 mg via ORAL
  Filled 2015-09-30: qty 2

## 2015-09-30 MED ORDER — CLOPIDOGREL BISULFATE 75 MG PO TABS
75.0000 mg | ORAL_TABLET | Freq: Every day | ORAL | Status: DC
  Administered 2015-10-01 – 2015-10-09 (×9): 75 mg via ORAL
  Filled 2015-09-30 (×9): qty 1

## 2015-09-30 MED ORDER — LEVOTHYROXINE SODIUM 75 MCG PO TABS
75.0000 ug | ORAL_TABLET | Freq: Every day | ORAL | Status: DC
  Administered 2015-10-01 – 2015-10-09 (×9): 75 ug via ORAL
  Filled 2015-09-30 (×9): qty 1

## 2015-09-30 MED ORDER — INFLUENZA VAC SPLIT QUAD 0.5 ML IM SUSY
0.5000 mL | PREFILLED_SYRINGE | INTRAMUSCULAR | Status: AC
  Administered 2015-10-01: 0.5 mL via INTRAMUSCULAR
  Filled 2015-09-30: qty 0.5

## 2015-09-30 MED ORDER — IPRATROPIUM-ALBUTEROL 0.5-2.5 (3) MG/3ML IN SOLN
3.0000 mL | RESPIRATORY_TRACT | Status: DC
  Administered 2015-09-30: 3 mL via RESPIRATORY_TRACT
  Filled 2015-09-30: qty 3

## 2015-09-30 MED ORDER — AMIODARONE HCL IN DEXTROSE 360-4.14 MG/200ML-% IV SOLN
INTRAVENOUS | Status: AC
  Administered 2015-09-30: 60 mg/h via INTRAVENOUS
  Filled 2015-09-30: qty 200

## 2015-09-30 MED ORDER — AMIODARONE HCL 400 MG PO TABS: 400.0000 mg | ORAL_TABLET | Freq: Two times a day (BID) | ORAL | 0 refills | Status: DC

## 2015-09-30 MED ORDER — AMIODARONE HCL IN DEXTROSE 360-4.14 MG/200ML-% IV SOLN
30.0000 mg/h | INTRAVENOUS | Status: DC
  Administered 2015-10-01 – 2015-10-09 (×15): 30 mg/h via INTRAVENOUS
  Filled 2015-09-30 (×19): qty 200

## 2015-09-30 MED ORDER — BUMETANIDE 0.25 MG/ML IJ SOLN
1.0000 mg | Freq: Two times a day (BID) | INTRAMUSCULAR | Status: AC
  Administered 2015-09-30 (×2): 1 mg via INTRAVENOUS
  Filled 2015-09-30 (×3): qty 4

## 2015-09-30 MED ORDER — ARFORMOTEROL TARTRATE 15 MCG/2ML IN NEBU
15.0000 ug | INHALATION_SOLUTION | Freq: Two times a day (BID) | RESPIRATORY_TRACT | Status: DC
  Administered 2015-09-30 – 2015-10-10 (×20): 15 ug via RESPIRATORY_TRACT
  Filled 2015-09-30 (×21): qty 2

## 2015-09-30 MED ORDER — SODIUM CHLORIDE 0.9% FLUSH: 3.0000 mL | INTRAVENOUS | Status: DC | PRN

## 2015-09-30 MED ORDER — ONDANSETRON HCL 4 MG/2ML IJ SOLN: 4.0000 mg | Freq: Four times a day (QID) | INTRAMUSCULAR | Status: DC | PRN

## 2015-09-30 MED ORDER — PANTOPRAZOLE SODIUM 40 MG PO TBEC
40.0000 mg | DELAYED_RELEASE_TABLET | Freq: Every day | ORAL | Status: DC
Start: 2015-10-01 — End: 2015-10-11
  Administered 2015-10-01 – 2015-10-11 (×11): 40 mg via ORAL
  Filled 2015-09-30 (×11): qty 1

## 2015-09-30 MED ORDER — AMIODARONE HCL IN DEXTROSE 360-4.14 MG/200ML-% IV SOLN
60.0000 mg/h | INTRAVENOUS | Status: AC
  Administered 2015-09-30 (×2): 60 mg/h via INTRAVENOUS
  Filled 2015-09-30: qty 200

## 2015-09-30 MED ORDER — CLOPIDOGREL BISULFATE 75 MG PO TABS: 75.0000 mg | ORAL_TABLET | Freq: Every day | ORAL | 0 refills | Status: DC

## 2015-09-30 MED ORDER — FUROSEMIDE 40 MG PO TABS: 40.0000 mg | ORAL_TABLET | Freq: Two times a day (BID) | ORAL | Status: DC

## 2015-09-30 MED ORDER — UMECLIDINIUM-VILANTEROL 62.5-25 MCG/INH IN AEPB
1.0000 | INHALATION_SPRAY | Freq: Every day | RESPIRATORY_TRACT | Status: DC
Start: 2015-10-01 — End: 2015-09-30
  Filled 2015-09-30: qty 14

## 2015-09-30 MED ORDER — NITROGLYCERIN 0.4 MG/SPRAY TL SOLN
1.0000 | Status: DC | PRN
  Filled 2015-09-30: qty 4.9

## 2015-09-30 MED ORDER — FOLIC ACID 1 MG PO TABS
1.0000 mg | ORAL_TABLET | Freq: Every day | ORAL | Status: DC
  Administered 2015-10-01 – 2015-10-09 (×9): 1 mg via ORAL
  Filled 2015-09-30 (×9): qty 1

## 2015-09-30 NOTE — Consult Note (Signed)
Reason for Consult: Acute on chronic renal failure Referring Physician: [name] is an 82 y.o. male.  HPI: 82 y/o man with CAD s/p CABG, combined systolic and diastolic heart failure from ICM with EF 25-30% s/p AICD, COPD, CKD 3, rheumatoid arthritis who presented to [facility] 9/7 with dyspnea and tachycardia noted to be fluid overloaded in and ventricular tachyardia that was electrically cardioverted. He subsequently developed fluid overload with increasing oxygen requirements and AKI. An attempt to improve cardiac output on dobutamine was limited by recurrent ventricular tachycardia halted with ATP.  PMH:   Past Medical History:  Diagnosis Date  . Arthritis   . Asthma   . CAD (coronary artery disease)    a. s/p CABG 1995; b. stress test 2006: inf post lat infarction w/ peri-infarct ischemia, AK inferior wall & HK inferolat wall, EF 28%. unchanged from prior study 07/2002.  Marland Kitchen Chronic combined systolic and diastolic CHF, NYHA class 3 (HCC)    a. 2007 EF 40%; b. 10/2008: EF 35% by echo; c. echo 10/16: EF 45-50%, sev inf/post wall HK, conduction abnormality, mild MR, mod dilated LA, PASP 47 mm Hg  . Chronic diarrhea   . Chronic respiratory failure (HCC)   . COPD (chronic obstructive pulmonary disease) (HCC)    on 2L o2 at home  . Emphysema of lung (HCC)   . History of GI bleed   . Hyperlipidemia   . Hypertension   . Hypothyroid   . Ischemic cardiomyopathy   . LBBB (left bundle branch block)   . MI (myocardial infarction) (HCC) 1995  . Sleep apnea    a. on CPAP  . Ventricular tachyarrhythmia (HCC) 09/24/2015    PSH:   Past Surgical History:  Procedure Laterality Date  . CARDIAC CATHETERIZATION    . CARDIAC CATHETERIZATION N/A 08/11/2015   Procedure: Right/Left Heart Cath and Coronary/Graft Angiography;  Surgeon: Iran Ouch, MD;  Location: [facility] INVASIVE CV LAB;  Service: Cardiovascular;  Laterality: N/A;  . CATARACT EXTRACTION    . CORONARY ARTERY BYPASS GRAFT  1995    5 vessels  . INSERT / REPLACE / REMOVE PACEMAKER     [employer], political party   . PACEMAKER INSERTION  2002  . REVERSE SHOULDER ARTHROPLASTY Right 2013  . REVERSE TOTAL SHOULDER ARTHROPLASTY Left 2014  . TOTAL KNEE ARTHROPLASTY  2009   both knees    Allergies: No Known Allergies  Medications:   Prior to Admission medications   Medication Sig Start Date End Date Taking? Authorizing Provider  acetaminophen (TYLENOL) 500 MG tablet Take 500 mg by mouth every 6 (six) hours as needed.    Historical Provider, MD  albuterol (PROVENTIL HFA;VENTOLIN HFA) 108 (90 Base) MCG/ACT inhaler Inhale 2 puffs into the lungs every 4 (four) hours as needed for wheezing or shortness of breath. 03/05/15   Erin Fulling, MD  alendronate (FOSAMAX) 70 MG tablet Take 70 mg by mouth once a week.  04/08/15   Historical Provider, MD  allopurinol (ZYLOPRIM) 100 MG tablet Take 100 mg by mouth daily.  08/25/14   Historical Provider, MD  amiodarone (PACERONE) 400 MG tablet Take 1 tablet (400 mg total) by mouth 2 (two) times daily. 09/30/15   Katha Hamming, MD  carvedilol (COREG) 3.125 MG tablet Take 1 tablet (3.125 mg total) by mouth 2 (two) times daily with a meal. 09/02/15   Duke Salvia, MD  clopidogrel (PLAVIX) 75 MG tablet Take 1 tablet (75 mg total) by mouth daily. 10/01/15  Katha Hamming, MD  folic acid (FOLVITE) 1 MG tablet Take 1 mg by mouth daily.  08/25/14   Historical Provider, MD  furosemide (LASIX) 40 MG tablet Take 1 tablet (40 mg total) by mouth 2 (two) times daily. 08/26/15   Antonieta Iba, MD  hydroxychloroquine (PLAQUENIL) 200 MG tablet Take 400 mg by mouth daily.  09/26/14   Historical Provider, MD  leflunomide (ARAVA) 20 MG tablet Take 20 mg by mouth daily.  08/25/14   Historical Provider, MD  levothyroxine (SYNTHROID, LEVOTHROID) 75 MCG tablet Take 1 tablet (75 mcg total) by mouth daily before breakfast. 06/12/15   [name], DO  montelukast (SINGULAIR) 10 MG tablet Take 10 mg by mouth daily.   01/25/15   Historical Provider, MD  Multiple Vitamins-Minerals (MULTIVITAMIN ADULT PO) Take 1 tablet by mouth daily.     Historical Provider, MD  nitroGLYCERIN (NITROLINGUAL) 0.4 MG/SPRAY spray Place 1 spray under the tongue every 5 (five) minutes x 3 doses as needed for chest pain.    Historical Provider, MD  omeprazole (PRILOSEC) 20 MG capsule Take 1 capsule (20 mg total) by mouth daily. 06/17/15   [name], DO  simvastatin (ZOCOR) 40 MG tablet Take 1 tablet (40 mg total) by mouth daily. 08/26/15   Antonieta Iba, MD  umeclidinium-vilanterol (ANORO ELLIPTA) 62.5-25 MCG/INH AEPB Inhale 1 puff into the lungs daily. 08/26/15   Merwyn Katos, MD    Discontinued Meds:   Medications Discontinued During This Encounter  Medication Reason  . amiodarone (PACERONE) tablet 400 mg   . furosemide (LASIX) tablet 40 mg   . umeclidinium-vilanterol (ANORO ELLIPTA) 62.5-25 MCG/INH 1 puff Formulary change    Social History:  reports that he quit smoking about 22 years ago. He has never used smokeless tobacco. He reports that he drinks alcohol. He reports that he does not use drugs.  Family History:   Family History  Problem Relation Age of Onset  . Hyperlipidemia Father   . Stroke Father   . Hypertension Father   . Kidney disease Father   . Kidney failure Mother     Was on dialysis   Review of Systems  Eyes: Negative for blurred vision.  Respiratory: Positive for cough and shortness of breath.   Cardiovascular: Positive for orthopnea and leg swelling. Negative for chest pain.  Gastrointestinal: Negative for blood in stool.  Genitourinary: Negative for dysuria and hematuria.  Musculoskeletal: Negative for falls.  Skin: Negative for rash.  Neurological: Positive for weakness. Negative for dizziness and headaches.  Endo/Heme/Allergies: Negative for polydipsia.  Psychiatric/Behavioral: Negative for memory loss.    Creatinine  Date/Time Value Ref Range Status  09/05/2014 11:22 AM 1.4 (A) 0.6 -  1.3 mg/dL Final  23/76/2831 51:76 AM 1.4 (A) 0.6 - 1.3 mg/dL Final   Creatinine, Ser  Date/Time Value Ref Range Status  09/30/2015 04:59 AM 3.73 (H) 0.61 - 1.24 mg/dL Final  16/07/3708 62:69 AM 3.54 (H) 0.61 - 1.24 mg/dL Final  48/54/6270 35:00 AM 3.52 (H) 0.61 - 1.24 mg/dL Final  93/81/8299 37:16 AM 3.16 (H) 0.61 - 1.24 mg/dL Final  96/78/9381 01:75 AM 2.73 (H) 0.61 - 1.24 mg/dL Final  11/10/8525 78:24 AM 2.05 (H) 0.61 - 1.24 mg/dL Final  23/53/6144 31:54 PM 1.77 (H) 0.61 - 1.24 mg/dL Final  00/86/7619 50:93 AM 1.49 (H) 0.76 - 1.27 mg/dL Final  26/71/2458 09:98 AM 1.42 (H) 0.61 - 1.24 mg/dL Final  33/82/5053 97:67 AM 1.52 (H) 0.61 - 1.24 mg/dL Final  08/16/2015 05:35 PM 1.49 (H) 0.61 - 1.24 mg/dL Final  51/02/5850 77:82 AM 1.47 (H) 0.61 - 1.24 mg/dL Final  42/35/3614 43:15 AM 1.44 (H) 0.61 - 1.24 mg/dL Final  40/08/6759 95:09 AM 1.44 (H) 0.61 - 1.24 mg/dL Final  32/67/1245 80:99 AM 1.24 0.61 - 1.24 mg/dL Final  83/38/2505 39:76 AM 1.39 (H) 0.61 - 1.24 mg/dL Final  73/41/9379 02:40 AM 1.26 (H) 0.61 - 1.24 mg/dL Final  97/35/3299 24:26 AM 1.18 0.61 - 1.24 mg/dL Final  83/41/9622 29:79 AM 1.16 0.61 - 1.24 mg/dL Final  89/21/1941 74:08 AM 1.31 (H) 0.61 - 1.24 mg/dL Final  14/48/1856 31:49 AM 1.68 (H) 0.61 - 1.24 mg/dL Final  70/26/3785 88:50 AM 1.99 (H) 0.61 - 1.24 mg/dL Final  27/74/1287 86:76 AM 1.21 0.61 - 1.24 mg/dL Final  72/09/4707 62:83 PM 1.09 0.61 - 1.24 mg/dL Final  66/29/4765 46:50 AM 1.02 0.61 - 1.24 mg/dL Final  35/46/5681 27:51 PM 1.45 0.40 - 1.50 mg/dL Final    Recent Labs Lab 09/24/15 1927 09/25/15 0555 09/26/15 0449 09/27/15 0702 09/28/15 0543 09/29/15 0459 09/30/15 0459  NA 136 134* 133* 131* 133* 133* 130*  K 4.2 4.3 4.4 4.4 4.2 4.0 4.4  CL 101 103 101 99* 99* 101 99*  CO2 21* 20* 19* 19* 22 22 20*  GLUCOSE 133* 125* 101* 91 93 96 122*  BUN 32* 33* 41* 48* 52* 58* 65*  CREATININE 1.77* 2.05* 2.73* 3.16* 3.52* 3.54* 3.73*  CALCIUM 8.9 8.2* 8.1* 8.1*  8.2* 8.3* 8.3*  PHOS  --  3.5 4.0 4.1 4.3 4.1 4.0    Recent Labs Lab 09/24/15 1927 09/25/15 0555 09/26/15 0449  WBC 7.5 8.3 8.1  HGB 11.1* 11.5* 11.5*  HCT 33.2* 33.9* 33.1*  MCV 95.6 94.7 92.9  PLT 120* 109* 101*   Liver Function Tests:  Recent Labs Lab 09/24/15 1927  AST 42*  ALT 26  ALKPHOS 97  BILITOT 1.6*  PROT 6.5  ALBUMIN 3.6   Cardiac Enzymes:  Recent Labs Lab 09/24/15 1927 09/25/15 0900 09/25/15 1722 09/26/15 0449  CKTOTAL  --   --   --  58  TROPONINI 0.05* 1.18* 0.89*  --    Iron Studies: No results for input(s): IRON, TIBC, TRANSFERRIN, FERRITIN in the last 72 hours.  Results for orders placed or performed during the hospital encounter of 09/30/15 (from the past 48 hour(s))  Carboxyhemoglobin     Status: Abnormal   Collection Time: 09/30/15  1:30 PM  Result Value Ref Range   Total hemoglobin 10.2 (L) 12.0 - 16.0 g/dL   O2 Saturation 70.0 %   Carboxyhemoglobin 1.4 0.5 - 1.5 %   Methemoglobin 0.9 0.0 - 1.5 %    Dg Chest Port 1 View  Result Date: 09/30/2015 CLINICAL DATA:  Central line placement EXAM: PORTABLE CHEST 1 VIEW COMPARISON:  09/25/2015 FINDINGS: Left pacer remains in place, unchanged. Interval placement of left internal jugular central line. The tip projects superiorly into the lateral wall of the SVC or possibly into the lower right innominate vein. No pneumothorax. There is cardiomegaly with vascular congestion. Bibasilar atelectasis and small effusions. IMPRESSION: Left PICC line tip projects superiorly into the lateral wall of the SVC or possibly into the lower portion of the right innominate vein. Cardiomegaly with vascular congestion. Small effusions with bibasilar atelectasis. Electronically Signed   By: Charlett Nose M.D.   On: 09/30/2015 14:10    Blood pressure 95/65, pulse 61, temperature 97.8 F (36.6 C), temperature source Oral, resp.  rate 12, height 5\' 8"  (1.727 m), weight 178 lb 2.1 oz (80.8 kg), SpO2 100 %.   GENERAL- alert,  co-operative man HEENT- Maybe small JVP (not measured), no cervical LAN, moist oral mucosa CARDIAC- RRR, no murmurs RESP- Faint basilar crackles, breathing on nasal cannula ABDOMEN- Soft, nontender, abdominal wall edema in dependent position  NEURO- No obvious Cr N abnormality, strength upper and lower extremities- 5/5 EXTREMITIES- symmetric, 1+ pedal edema bilaterally, 1+ upper extremity edema in dependent positions (elbows/forearms), bilateral arthritic changes of MCP joints PSYCH- Pleasant mood and affect, appropriate thought content and speech.    Assessment/Plan: 1.  Acute renal failure on chronic kidney disease stage 3: This presentation seems to be a mixed picture of ischemic ATN from hypoperfusion likely during his initial VTach with decompensation s/p conversion with recovery impaired by low output heart failure limiting perfusion now. SCr has continued to rise but at a decreasing rate altuough urea still higher. He is still making urine although obviously third space fluid overloaded on exam. His blood pressure is soft with very low physiologic reserve so careful monitoring is needed for diuresis. 1. Volume retention is the current complication of his AKI. We will follow this closely and continue medical management at this time. 2. Switch lasix to Bumex 1mg  IV BID today and agree with checking CVP, strict I/Os for an approximation of his intravascular volume status. 3. Check urine electrolytes in the unlikely case an alternate intrinsic injury is present.  2. Combined systolic and diastolic heart failure from ischemic cardiomyopathy: Diuresis as above and planned trial of ionotropes or otherwise per Heart Failure team  3. Vtach: S/p defibrillation and later chemical cardioversion with ATP, currently in a normal wrhythm   Fuller Plan, MD PGY-II Internal Medicine Resident Pager# [phone] 09/30/2015, 3:51 PM

## 2015-09-30 NOTE — Progress Notes (Signed)
[facility]  ROUNDING NOTE   Subjective:   Patient to be transferred to [facility] V-tach again last night Family at bedside bedside.  UOP oliguric.   Furosemide 40mg  BID  Creatinine 3.73 (3.54) (3.52)  Objective:  Vital signs in last 24 hours:  Temp:  [97.5 F (36.4 C)-98.2 F (36.8 C)] 98.2 F (36.8 C) (09/13 0849) Pulse Rate:  [61-151] 71 (09/13 0849) Resp:  [12-20] 18 (09/13 0849) BP: (71-116)/(47-69) 103/48 (09/13 0849) SpO2:  [92 %-100 %] 92 % (09/13 0849) Weight:  [85.5 kg (188 lb 8 oz)] 85.5 kg (188 lb 8 oz) (09/13 0514)  Weight change: 4.581 kg (10 lb 1.6 oz) Filed Weights   09/28/15 0508 09/29/15 0348 09/30/15 0514  Weight: 80.3 kg (177 lb 1.6 oz) 80.9 kg (178 lb 6.4 oz) 85.5 kg (188 lb 8 oz)    Intake/Output: I/O last 3 completed shifts: In: 728.7 [P.O.:720; I.V.:8.7] Out: 1125 [Urine:1125]   Intake/Output this shift:  Total I/O In: 240 [P.O.:240] Out: -   Physical Exam: General: No acute distress  Head: Normocephalic, atraumatic. Moist oral mucosal membranes  Eyes: Anicteric  Neck: Supple, trachea midline  Lungs:  Basilar rales  Heart: S1S2 no rubs  Abdomen:  Soft, nontender, BS present  Extremities: 1+ peripheral edema.  Neurologic: Nonfocal, moving all four extremities  Skin: No lesions  Access:     Basic Metabolic Panel:  Recent Labs Lab 09/25/15 0555 09/26/15 0449 09/27/15 0702 09/28/15 0543 09/29/15 0459 09/30/15 0459  NA 134* 133* 131* 133* 133* 130*  K 4.3 4.4 4.4 4.2 4.0 4.4  CL 103 101 99* 99* 101 99*  CO2 20* 19* 19* 22 22 20*  GLUCOSE 125* 101* 91 93 96 122*  BUN 33* 41* 48* 52* 58* 65*  CREATININE 2.05* 2.73* 3.16* 3.52* 3.54* 3.73*  CALCIUM 8.2* 8.1* 8.1* 8.2* 8.3* 8.3*  MG 2.2 2.1  --   --   --   --   PHOS 3.5 4.0 4.1 4.3 4.1 4.0    Liver Function Tests:  Recent Labs Lab 09/24/15 1927  AST 42*  ALT 26  ALKPHOS 97  BILITOT 1.6*  PROT 6.5  ALBUMIN 3.6   No results for input(s): LIPASE, AMYLASE in  the last 168 hours. No results for input(s): AMMONIA in the last 168 hours.  CBC:  Recent Labs Lab 09/24/15 1927 09/25/15 0555 09/26/15 0449  WBC 7.5 8.3 8.1  HGB 11.1* 11.5* 11.5*  HCT 33.2* 33.9* 33.1*  MCV 95.6 94.7 92.9  PLT 120* 109* 101*    Cardiac Enzymes:  Recent Labs Lab 09/24/15 1927 09/25/15 0900 09/25/15 1722 09/26/15 0449  CKTOTAL  --   --   --  58  TROPONINI 0.05* 1.18* 0.89*  --     BNP: Invalid input(s): POCBNP  CBG:  Recent Labs Lab 09/24/15 2226  GLUCAP 142*    Microbiology: Results for orders placed or performed during the hospital encounter of 09/24/15  MRSA PCR Screening     Status: None   Collection Time: 09/24/15 10:26 PM  Result Value Ref Range Status   MRSA by PCR NEGATIVE NEGATIVE Final    Comment:        The GeneXpert MRSA Assay (FDA approved for NASAL specimens only), is one component of a comprehensive MRSA colonization surveillance program. It is not intended to diagnose MRSA infection nor to guide or monitor treatment for MRSA infections.     Coagulation Studies: No results for input(s): LABPROT, INR in the last  72 hours.  Urinalysis: No results for input(s): COLORURINE, LABSPEC, PHURINE, GLUCOSEU, HGBUR, BILIRUBINUR, KETONESUR, PROTEINUR, UROBILINOGEN, NITRITE, LEUKOCYTESUR in the last 72 hours.  Invalid input(s): APPERANCEUR    Imaging: No results found.   Medications:     . allopurinol  100 mg Oral Daily  . amiodarone  400 mg Oral BID  . carvedilol  3.125 mg Oral BID WC  . clopidogrel  75 mg Oral Daily  . famotidine  20 mg Oral Daily  . folic acid  1 mg Oral Daily  . furosemide  40 mg Oral BID  . leflunomide  20 mg Oral Daily  . levothyroxine  75 mcg Oral QAC breakfast  . montelukast  10 mg Oral QHS  . simvastatin  40 mg Oral q1800  . sodium chloride flush  3 mL Intravenous Q12H  . umeclidinium-vilanterol  1 puff Inhalation Daily   sodium chloride, sodium chloride, acetaminophen,  ipratropium-albuterol, morphine injection, ondansetron (ZOFRAN) IV, sodium chloride flush  Assessment/ Plan:  82 y.o.white male with COPD, rheumatoid arthritis, hypertension, coronary artery disease status post CABG, systolic congestive heart failure, pacemaker/AICD  1. Acute renal failure on chronic kidney disease stage III Baseline creatinine 1.4 on 09/01/15  Acute renal failure from acute cardiorenal syndrome versus ATN. More likely ATN due to v-tach and hypotension.  Low threshold for dialysis for volume. Currently with anasarca.  - hold entresto due to renal insufficiency - Furosemide PO 40mg  bid - Discussed dialysis with patient and family.   2.  Hyponatremia: secondary to congestive heart failure. Stable.  - furosemide as above.   3. Acute systolic congestive heart failure. Ejection fraction 25-30%.  - furosemide PO 40mg  bid - Appreciate cardiology input.    LOS: 6 Zygmunt Mcglinn 9/13/201710:53 AM

## 2015-09-30 NOTE — Progress Notes (Signed)
Pt complained of edema in right arm and leg. Both arms propped up with pillows. Will continue to monitor

## 2015-09-30 NOTE — Discharge Summary (Signed)
Tom Nguyen, is a 82 y.o. male  DOB 1932-07-09  MRN [id number].  Admission date:  09/24/2015  Admitting Physician  Erin Fulling, MD  Discharge Date:  09/30/2015   Primary MD  Olevia Perches, DO  Recommendations for primary care physician for things to follow:   Patient is being transferred  [facility] for advanced treatment for his systolic heart failure, renal failure.  Admission Diagnosis  Ventricular tachycardia (HCC) [I47.2] Syncope, unspecified syncope type [R55]   Discharge Diagnosis  Ventricular tachycardia (HCC) [I47.2] Syncope, unspecified syncope type [R55]    Principal Problem:   Sustained ventricular fibrillation (HCC) Active Problems:   Cardiomyopathy, ischemic   Atherosclerosis of coronary artery bypass graft of native heart without angina pectoris   Pulmonary HTN (HCC)   Centrilobular emphysema (HCC)   Acute on chronic systolic (congestive) heart failure (HCC)   ARF (acute renal failure) (HCC)   Ventricular tachycardia (HCC)      Past Medical History:  Diagnosis Date  . Arthritis   . Asthma   . CAD (coronary artery disease)    a. s/p CABG 1995; b. stress test 2006: inf post lat infarction w/ peri-infarct ischemia, AK inferior wall & HK inferolat wall, EF 28%. unchanged from prior study 07/2002.  Marland Kitchen Chronic combined systolic and diastolic CHF, NYHA class 3 (HCC)    a. 2007 EF 40%; b. 10/2008: EF 35% by echo; c. echo 10/16: EF 45-50%, sev inf/post wall HK, conduction abnormality, mild MR, mod dilated LA, PASP 47 mm Hg  . Chronic diarrhea   . Chronic respiratory failure (HCC)   . COPD (chronic obstructive pulmonary disease) (HCC)    on 2L o2 at home  . Emphysema of lung (HCC)   . History of GI bleed   . Hyperlipidemia   . Hypertension   . Hypothyroid   . Ischemic  cardiomyopathy   . LBBB (left bundle branch block)   . MI (myocardial infarction) (HCC) 1995  . Sleep apnea    a. on CPAP  . Ventricular tachyarrhythmia (HCC) 09/24/2015    Past Surgical History:  Procedure Laterality Date  . CARDIAC CATHETERIZATION    . CARDIAC CATHETERIZATION N/A 08/11/2015   Procedure: Right/Left Heart Cath and Coronary/Graft Angiography;  Surgeon: Iran Ouch, MD;  Location: [facility] INVASIVE CV LAB;  Service: Cardiovascular;  Laterality: N/A;  . CATARACT EXTRACTION    . CORONARY ARTERY BYPASS GRAFT  1995   5 vessels  . INSERT / REPLACE / REMOVE PACEMAKER     [employer], political party   . PACEMAKER INSERTION  2002  . REVERSE SHOULDER ARTHROPLASTY Right 2013  . REVERSE TOTAL SHOULDER ARTHROPLASTY Left 2014  . TOTAL KNEE ARTHROPLASTY  2009   both knees       History of present illness and  Hospital Course:     Kindly see H&P for history of present illness and admission details, please review complete Labs, Consult reports and Test reports for all details in brief  HPI  from the history and physical done on the day of admission 82 year old male patient came in because of acute shortness of breath, tachycardia.patient found to have ventricular tachycardia  With HR 168 bpm.started on amiodarone drip in the emergency room. patient did not have successful chemical cardioversion, he developed syncope and hypotension requiring immediate defibrillation for sustained  V tach.. . Hospital Course  #1 symptomatic sustained ventricular tachycardia status post defibrillation with 150 J., successful cardioversion to normal sinus rhythm. Started on  amiodarone drip, admitted to intensive care unit.Seen by cardiology from [facility], patient seen by Dr. Mariah Milling. Patient has history of chronic systolic heart failure status post ICD. [place]  scientific representative came out to decrease the threshold for V. Tach below 160.patient is on amiodarone 400 mg by mouth twice a day, Coreg  3.125 mg by mouth twice a day at this time.   #2 acute on chronic combined systolic /diastolic heart failure:patient has ICD. Patient rate elevated to 174 pounds at the time of admission, his dry weight is 164 pounds. Patient did not get Lasix initially due to hypotension. Patient did not get beta blockers, and  entresto  secondary to hypotension and acute renal failure. echocardiogram shows EF of 25-30% by echo on 7/20  #3 acute on chronic renal failure with chronic kidney disease stage III:  Patient's creatinine continues to rise today it is 3.73. Patient's baseline  CR is 1.48.acute renal failure is  due to cardiorenal syndrome versus ATN. Seen by nephrology..patient is right now on Lasix 40 mg by mouth twice a day. Seen by nephrology. Because of worsening kidney function also because of systolic heart failure the patient is made to transfer the patient to [facility] under cardiology service for stepdown unit.patient, family are and agreed for transfer.  #4/hypotension;patientarteries on dopamine by cardiology yesterday but immediately discontinued because of development of V. Tach on dopamine. #5 history of COPD: Patient is on  anoro Ellipta, Singulair. #6 history of rheumatoid arthritis: Patient is on  Arava, folic acid.   Discharge Condition: stable   Follow UP  Follow-up Information    Sherryl Manges, MD Follow up in 1 week(s).   Specialty:  Cardiology Contact information: [address] [place] [address] [phone]        Olevia Perches, DO Follow up in 1 week(s).   Specialty:  Family Medicine Contact information: [address] [place] [address] [phone]             Discharge Instructions  and  Discharge Medications          Diet and Activity recommendation: See Discharge Instructions above   Consults obtained - cardiology   Major procedures and Radiology Reports - PLEASE review detailed and final reports for all  details, in brief -     US Renal  Result Date: 09/27/2015 CLINICAL DATA:  Acute renal failure. EXAM: RENAL / URINARY TRACT ULTRASOUND COMPLETE COMPARISON:  08/07/2015 FINDINGS: Right Kidney: Length: 9.9 cm. Cortical thinning noted with slight increased cortical echogenicity. No hydronephrosis. Left Kidney: Length: 11.5 cm multiple simple appearing cysts are identified, as before. No hydronephrosis. Echogenicity within normal limits. No mass or hydronephrosis visualized. Bladder: Appears normal for degree of bladder distention. Other:  Small volume ascites noted with 2.8 cm hepatic cyst evident. IMPRESSION: No evidence for hydronephrosis. Electronically Signed   By: Kennith Center M.D.   On: 09/27/2015 10:30   Dg Chest Port 1 View  Result Date: 09/25/2015 CLINICAL DATA:  82 year old male with shortness of breath. EXAM: PORTABLE CHEST 1 VIEW COMPARISON:  Chest radiograph dated 09/24/2015 FINDINGS: There is stable moderate cardiomegaly with minimal central vascular prominence. There is no focal consolidation, pleural effusion, or pneumothorax. Right apical pleural thickening. Left pectoral AICD device and median sternotomy wires noted. There is osteopenia with degenerative changes of the spine. Bilateral shoulder arthroplasty. No acute fracture. IMPRESSION: Cardiomegaly with probable mild congestive changes. No focal consolidation or edema. Electronically Signed   By: Burtis Junes  Radparvar M.D.   On: 09/25/2015 02:59   Dg Chest Port 1 View  Result Date: 09/24/2015 CLINICAL DATA:  Dyspnea EXAM: PORTABLE CHEST 1 VIEW COMPARISON:  08/12/2015 FINDINGS: Cardiomegaly again noted. Status post median sternotomy. 3 leads cardiac pacemaker is unchanged in position. No infiltrate or pulmonary edema. Bilateral shoulder prosthesis again noted. IMPRESSION: Cardiomegaly. No active disease. Cardiac pacemaker unchanged in position. Electronically Signed   By: Natasha Mead M.D.   On: 09/24/2015 20:24    Micro Results    Recent  Results (from the past 240 hour(s))  MRSA PCR Screening     Status: None   Collection Time: 09/24/15 10:26 PM  Result Value Ref Range Status   MRSA by PCR NEGATIVE NEGATIVE Final    Comment:        The GeneXpert MRSA Assay (FDA approved for NASAL specimens only), is one component of a comprehensive MRSA colonization surveillance program. It is not intended to diagnose MRSA infection nor to guide or monitor treatment for MRSA infections.        Today   Subjective:   Tom Nguyen seen today,denies chest pain. Medically stable for transfer to [facility].  Objective:   Blood pressure (!) 103/48, pulse 71, temperature 98.2 F (36.8 C), temperature source Oral, resp. rate 18, height 5\' 8"  (1.727 m), weight 85.5 kg (188 lb 8 oz), SpO2 92 %.   Intake/Output Summary (Last 24 hours) at 09/30/15 1007 Last data filed at 09/30/15 0941  Gross per 24 hour  Intake           728.67 ml  Output              400 ml  Net           328.67 ml    Exam Awake Alert, Oriented x 3, No new F.N deficits, Normal affect Power.AT,PERRAL Supple Neck,No JVD, No cervical lymphadenopathy appriciated.  Symmetrical Chest wall movement, decreased breath sound at bases. RRR,No Gallops,Rubs or new Murmurs, No Parasternal Heave +ve B.Sounds, Abd Soft, Non tender, No organomegaly appriciated, No rebound -guarding or rigidity. No Cyanosis, Clubbing or edema, No new Rash or bruise  Data Review   CBC w Diff: Lab Results  Component Value Date   WBC 8.1 09/26/2015   HGB 11.5 (L) 09/26/2015   HCT 33.1 (L) 09/26/2015   HCT 31.0 (L) 09/01/2015   PLT 101 (L) 09/26/2015   PLT 142 (L) 09/01/2015   LYMPHOPCT 7 04/13/2015   MONOPCT 12 04/13/2015   EOSPCT 0 04/13/2015   BASOPCT 0 04/13/2015    CMP: Lab Results  Component Value Date   NA 130 (L) 09/30/2015   NA 137 09/01/2015   K 4.4 09/30/2015   CL 99 (L) 09/30/2015   CO2 20 (L) 09/30/2015   BUN 65 (H) 09/30/2015   BUN 28 (H) 09/01/2015    CREATININE 3.73 (H) 09/30/2015   GLU 88 09/05/2014   PROT 6.5 09/24/2015   PROT 5.7 (L) 09/01/2015   ALBUMIN 3.6 09/24/2015   ALBUMIN 3.7 09/01/2015   BILITOT 1.6 (H) 09/24/2015   BILITOT 0.8 09/01/2015   ALKPHOS 97 09/24/2015   AST 42 (H) 09/24/2015   ALT 26 09/24/2015  .   Total Time in preparing paper work, data evaluation and todays exam - 35 minutes  Tom Nguyen M.D on 09/30/2015 at 10:07 AM    Note: This dictation was prepared with Dragon dictation along with smaller phrase technology. Any transcriptional errors that result from this process are unintentional.

## 2015-09-30 NOTE — Progress Notes (Signed)
CVP set up on 09/30/15

## 2015-09-30 NOTE — H&P (Signed)
Patient ID: Tom Nguyen, male   DOB: 12-05-1932, 82 y.o.   MRN: [id number]      SUBJECTIVE: Patient transferred from [facility] today.  He was initially admitted to [facility] after VT with ICD discharge, then developed progressive renal dysfunction with volume overload.  Creatinine up to 3.73.  He was started on dopamine yesterday but had VT in 140s terminated by ATP.    He is comfortable at rest, currently in NSR with BiV pacing.   Scheduled Meds: . [START ON 10/01/2015] allopurinol  100 mg Oral Daily  . carvedilol  3.125 mg Oral BID WC  . [START ON 10/01/2015] clopidogrel  75 mg Oral Daily  . [START ON 10/01/2015] folic acid  1 mg Oral Daily  . heparin  5,000 Units Subcutaneous Q8H  . ipratropium-albuterol  3 mL Nebulization Q4H  . leflunomide  20 mg Oral Daily  . [START ON 10/01/2015] levothyroxine  75 mcg Oral QAC breakfast  . [START ON 10/01/2015] montelukast  10 mg Oral Daily  . [START ON 10/01/2015] pantoprazole  40 mg Oral Daily  . simvastatin  40 mg Oral q1800  . sodium chloride flush  3 mL Intravenous Q12H  . [START ON 10/01/2015] umeclidinium-vilanterol  1 puff Inhalation Daily   Continuous Infusions: . amiodarone    . amiodarone     PRN Meds:.sodium chloride, acetaminophen, nitroGLYCERIN, ondansetron (ZOFRAN) IV, sodium chloride flush    Vitals:   09/30/15 1200  BP: (!) 98/58  Pulse: 63  Resp: 16  Temp: 97.8 F (36.6 C)  TempSrc: Oral  SpO2: 99%  Weight: 178 lb 2.1 oz (80.8 kg)  Height: 5\' 8"  (1.727 m)   No intake or output data in the 24 hours ending 09/30/15 1255  LABS: Basic Metabolic Panel:  Recent Labs  62/03/55 0459 09/30/15 0459  NA 133* 130*  K 4.0 4.4  CL 101 99*  CO2 22 20*  GLUCOSE 96 122*  BUN 58* 65*  CREATININE 3.54* 3.73*  CALCIUM 8.3* 8.3*  PHOS 4.1 4.0   Liver Function Tests: No results for input(s): AST, ALT, ALKPHOS, BILITOT, PROT, ALBUMIN in the last 72 hours. No results for input(s): LIPASE, AMYLASE in the last 72 hours. CBC: No results  for input(s): WBC, NEUTROABS, HGB, HCT, MCV, PLT in the last 72 hours. Cardiac Enzymes: No results for input(s): CKTOTAL, CKMB, CKMBINDEX, TROPONINI in the last 72 hours. BNP: Invalid input(s): POCBNP D-Dimer: No results for input(s): DDIMER in the last 72 hours. Hemoglobin A1C: No results for input(s): HGBA1C in the last 72 hours. Fasting Lipid Panel: No results for input(s): CHOL, HDL, LDLCALC, TRIG, CHOLHDL, LDLDIRECT in the last 72 hours. Thyroid Function Tests: No results for input(s): TSH, T4TOTAL, T3FREE, THYROIDAB in the last 72 hours.  Invalid input(s): FREET3 Anemia Panel: No results for input(s): VITAMINB12, FOLATE, FERRITIN, TIBC, IRON, RETICCTPCT in the last 72 hours.  RADIOLOGY: US Renal  Result Date: 09/27/2015 CLINICAL DATA:  Acute renal failure. EXAM: RENAL / URINARY TRACT ULTRASOUND COMPLETE COMPARISON:  08/07/2015 FINDINGS: Right Kidney: Length: 9.9 cm. Cortical thinning noted with slight increased cortical echogenicity. No hydronephrosis. Left Kidney: Length: 11.5 cm multiple simple appearing cysts are identified, as before. No hydronephrosis. Echogenicity within normal limits. No mass or hydronephrosis visualized. Bladder: Appears normal for degree of bladder distention. Other:  Small volume ascites noted with 2.8 cm hepatic cyst evident. IMPRESSION: No evidence for hydronephrosis. Electronically Signed   By: Kennith Center M.D.   On: 09/27/2015 10:30   Dg Chest Port 1  View  Result Date: 09/25/2015 CLINICAL DATA:  82 year old male with shortness of breath. EXAM: PORTABLE CHEST 1 VIEW COMPARISON:  Chest radiograph dated 09/24/2015 FINDINGS: There is stable moderate cardiomegaly with minimal central vascular prominence. There is no focal consolidation, pleural effusion, or pneumothorax. Right apical pleural thickening. Left pectoral AICD device and median sternotomy wires noted. There is osteopenia with degenerative changes of the spine. Bilateral shoulder arthroplasty. No  acute fracture. IMPRESSION: Cardiomegaly with probable mild congestive changes. No focal consolidation or edema. Electronically Signed   By: Elgie Collard M.D.   On: 09/25/2015 02:59   Dg Chest Port 1 View  Result Date: 09/24/2015 CLINICAL DATA:  Dyspnea EXAM: PORTABLE CHEST 1 VIEW COMPARISON:  08/12/2015 FINDINGS: Cardiomegaly again noted. Status post median sternotomy. 3 leads cardiac pacemaker is unchanged in position. No infiltrate or pulmonary edema. Bilateral shoulder prosthesis again noted. IMPRESSION: Cardiomegaly. No active disease. Cardiac pacemaker unchanged in position. Electronically Signed   By: Natasha Mead M.D.   On: 09/24/2015 20:24    PHYSICAL EXAM General: NAD Neck: JVP 14 cm, no thyromegaly or thyroid nodule.  Lungs: Crackles at bases bilaterally. CV: Lateral PMI.  Heart sounds distant, regular S1/S2, no S3/S4, no murmur.  1+ edema to knees bilaterally.   Abdomen: Soft, nontender, no hepatosplenomegaly, no distention.  Neurologic: Alert and oriented x 3.  Psych: Normal affect. Extremities: No clubbing or cyanosis.   TELEMETRY: Reviewed telemetry pt in NSR with BiV pacing:  ASSESSMENT AND PLAN: 82 with history of CAD s/p CABG, ischemic cardiomyopathy s/p [employer] CRT-D, paroxysmal atrial fibrillation, CKD admitted with VT and ICD discharge.  At [facility], he developed progressive volume overload and renal dysfunction, had VT again with dopamine use.  Transferred to [facility].  1. VT: Appropriate ICD discharge prior to admission.  Had another VT episode in hospital on dopamine, terminated with ATP. Do not think this was triggered by ACS, likely scar-mediated. No chest pain.  - Restart amiodarone gtt, suspect low output HF and will need to attempt to use milrinone and diurese.  2. CAD: s/p CABG.  Had LHC in 7/17 with no options for revascularization.  Would not repeat cath (especially with AKI). Elevated troponin at admission may be demand ischemia with volume overload and  defibrillation.  - Continue Plavix, statin.  3. Acute on chronic systolic CHF: EF 00-45%, moderate MR, moderately decreased RV systolic function, moderate TR on 7/17 echo, ischemic cardiomyopathy.  [employer] CRT-D.  RHC in 7/17 showed low cardiac output and elevated filling pressures (PA 55/31, mean PCWP 32, CI 1.6).  Today, he is volume overloaded on exam but creatinine has continued to rise.  Concerned for low output HF/cardiogenic shock with cardiorenal syndrome.  SBP in 90s-100s.  Very difficult situation.   - Place CVL => measure co-ox and CVP.  If co-ox low, will attempt milrinone gtt at 0.25 with accompanying amiodarone infusion to try to keep him out of VT.  Can add mexiletine if neede.  - Once on milrinone, will need IV diuresis.   - Long-term prognosis poor.  With current renal function and advanced age, not good LVAD candidate.  Not sure he is going to be able to tolerate milrinone with history of VT.  If we are able to use milrinone, our best option will be full diuresis then try to get him on a stable medical regimen.  4. AKI on CKD stage III: Most likely hemodynamic.  Known low cardiac output based on RHC in 7/17. Will ask renal  to see, not a good long-term HD candidate.   5. Atrial fibrillation: History of PAF, has not been anticoagulated however.   40 minutes critical care time.   Marca Ancona 09/30/2015 1:04 PM

## 2015-09-30 NOTE — Care Management (Signed)
Informed that patient is for transfer to [facility] for LVAD consideration.  To travel by [facility].  [facility] is adamant that Emtala form is required because even though Patient is transferring to a facility within the [facility] system since will be crossing [employer], requires emtala.

## 2015-09-30 NOTE — Progress Notes (Signed)
Patient: Tom Nguyen / Admit Date: 09/24/2015 / Date of Encounter: 09/30/2015, 10:16 AM   Subjective: Had a restful minute, mild shortness of breath Reports having scrotal tenderness, swelling Interested in pursuing aggressive treatment options for heart failure, interested in going to [facility] Minimal diuresis Events from yesterday again reviewed with him including run of VT on low-dose dopamine  Review of Systems: Review of Systems  Constitutional: Positive for malaise/fatigue.  Respiratory: Positive for shortness of breath.   Cardiovascular: Positive for leg swelling.  Gastrointestinal: Negative.   Genitourinary:       Scrotal swelling  Musculoskeletal: Negative.   Neurological: Negative.   Psychiatric/Behavioral: Negative.   All other systems reviewed and are negative.  All other systems reviewed and negative.   Objective: Telemetry: Paced rhythm, rate 60s to 90s Physical Exam: Blood pressure (!) 103/48, pulse 71, temperature 98.2 F (36.8 C), temperature source Oral, resp. rate 18, height 5\' 8"  (1.727 m), weight 188 lb 8 oz (85.5 kg), SpO2 92 %. Body mass index is 28.66 kg/m. General: Well developed, well nourished, Mild SOB at rest in bed Head: Normocephalic, atraumatic, sclera non-icteric, no xanthomas, nares are without discharge. Neck: Negative for carotid bruits. JVP 10+ CM Lungs: Mildly decreased breath sounds throughout, but dull at the bases Breathing is mildly labored Heart: RRR S1 S2 with II/VI SEM LSB,  no rubs, or gallops.  Abdomen: Soft, non-tender, non-distended with normoactive bowel sounds. No rebound/guarding. Extremities: No clubbing or cyanosis. Trace pitting edema lower extremities Distal pedal pulses are 2+ and equal bilaterally. Neuro: Alert and oriented X 3. Moves all extremities spontaneously. Psych:  Responds to questions appropriately with a normal affect.   Intake/Output Summary (Last 24 hours) at 09/30/15 1016 Last data filed at 09/30/15  0941  Gross per 24 hour  Intake           728.67 ml  Output              400 ml  Net           328.67 ml    Inpatient Medications:  . allopurinol  100 mg Oral Daily  . amiodarone  400 mg Oral BID  . carvedilol  3.125 mg Oral BID WC  . clopidogrel  75 mg Oral Daily  . famotidine  20 mg Oral Daily  . folic acid  1 mg Oral Daily  . furosemide  40 mg Oral BID  . leflunomide  20 mg Oral Daily  . levothyroxine  75 mcg Oral QAC breakfast  . montelukast  10 mg Oral QHS  . simvastatin  40 mg Oral q1800  . sodium chloride flush  3 mL Intravenous Q12H  . umeclidinium-vilanterol  1 puff Inhalation Daily   Infusions:    Labs:  Recent Labs  09/29/15 0459 09/30/15 0459  NA 133* 130*  K 4.0 4.4  CL 101 99*  CO2 22 20*  GLUCOSE 96 122*  BUN 58* 65*  CREATININE 3.54* 3.73*  CALCIUM 8.3* 8.3*  PHOS 4.1 4.0   No results for input(s): AST, ALT, ALKPHOS, BILITOT, PROT, ALBUMIN in the last 72 hours. No results for input(s): WBC, NEUTROABS, HGB, HCT, MCV, PLT in the last 72 hours. No results for input(s): CKTOTAL, CKMB, TROPONINI in the last 72 hours. Invalid input(s): POCBNP No results for input(s): HGBA1C in the last 72 hours.   Weights: Filed Weights   09/28/15 0508 09/29/15 0348 09/30/15 0514  Weight: 177 lb 1.6 oz (80.3 kg) 178 lb 6.4 oz (  80.9 kg) 188 lb 8 oz (85.5 kg)     Radiology/Studies:  US Renal  Result Date: 2015/10/26 CLINICAL DATA:  Acute renal failure. EXAM: RENAL / URINARY TRACT ULTRASOUND COMPLETE COMPARISON:  08/07/2015 FINDINGS: Right Kidney: Length: 9.9 cm. Cortical thinning noted with slight increased cortical echogenicity. No hydronephrosis. Left Kidney: Length: 11.5 cm multiple simple appearing cysts are identified, as before. No hydronephrosis. Echogenicity within normal limits. No mass or hydronephrosis visualized. Bladder: Appears normal for degree of bladder distention. Other:  Small volume ascites noted with 2.8 cm hepatic cyst evident. IMPRESSION: No  evidence for hydronephrosis. Electronically Signed   By: Kennith Center M.D.   On: October 26, 2015 10:30   Dg Chest Port 1 View  Result Date: 09/25/2015 CLINICAL DATA:  82 year old male with shortness of breath. EXAM: PORTABLE CHEST 1 VIEW COMPARISON:  Chest radiograph dated 09/24/2015 FINDINGS: There is stable moderate cardiomegaly with minimal central vascular prominence. There is no focal consolidation, pleural effusion, or pneumothorax. Right apical pleural thickening. Left pectoral AICD device and median sternotomy wires noted. There is osteopenia with degenerative changes of the spine. Bilateral shoulder arthroplasty. No acute fracture. IMPRESSION: Cardiomegaly with probable mild congestive changes. No focal consolidation or edema. Electronically Signed   By: Elgie Collard M.D.   On: 09/25/2015 02:59   Dg Chest Port 1 View  Result Date: 09/24/2015 CLINICAL DATA:  Dyspnea EXAM: PORTABLE CHEST 1 VIEW COMPARISON:  08/12/2015 FINDINGS: Cardiomegaly again noted. Status post median sternotomy. 3 leads cardiac pacemaker is unchanged in position. No infiltrate or pulmonary edema. Bilateral shoulder prosthesis again noted. IMPRESSION: Cardiomegaly. No active disease. Cardiac pacemaker unchanged in position. Electronically Signed   By: Natasha Mead M.D.   On: 09/24/2015 20:24     Assessment and Plan  82 y.o. male  Acute on chronic systolic CHF/decompensated heart failure Showing signs of end-stage heart failure, worsening renal function, minimal urine output Unable to tolerate inotropes secondary to VT on low-dose dopamine. Do not try dobutamine or milrinone at this time Case discussed with Marca Ancona at [facility], he is familiar with the patient Long discussion with family He would like to pursue aggressive treatment options," not ready to give up" Options may include IV levo for blood pressure support an effort to stabilize renal function, increase urine output More aggressive options include Aquaphor  pheresis, and impella device, balloon pump. If renal function can improve back to his baseline, perhaps could be a candidate for LVAD  Acute on chronic renal dysfunction -Likely component of ATN from low blood pressure in the setting of VT on arrival, very short episode yesterday 8 minutes but did have hypotension systolics in the 70s -Also likely component of cardiorenal Suspect renal function may improve with better blood pressure support, Unable to use inotropes secondary to VT  CAD, CABG Recent catheterization July 2017, no options for revascularization  VT, sustained Treated with IV amiodarone on arrival, transition to amiodarone 400 mg by mouth twice a day Case discussed with Dr. Graciela Husbands on arrival and again yesterday He has ATP, delivered yesterday which seemed to terminate arrhythmia after 8 minutes Shock not delivered as VT rate below threshold Rate was in the 140 range May need to re-program if he has recurrent runs of VT or rebolus was amiodarone  Case discussed with hospitalist and dr Shirlee Latch, will [facility] to [facility]  Total encounter time more than 35 minutes  Greater than 50% was spent in counseling and coordination of care with the patient  Signed, Dossie Arbour, MD, Ph.D.  [facility] 09/30/2015, 10:16 AM

## 2015-09-30 NOTE — Progress Notes (Signed)
[facility] is being transfer to [facility], report given to receiving nurse, patient left the unit via care links, patient was alert and oriented prior to transport

## 2015-09-30 NOTE — Progress Notes (Signed)
Patient presently resting in the bed, family at bedside, dr Nadara Mode at bedside discussing about transferring patient to [facility]. Patient appear to agree with plan of care. No distress noted at this time.

## 2015-09-30 NOTE — Procedures (Signed)
Central Venous Catheter Insertion Procedure Note Tom Nguyen [id number] 1932/02/02  Procedure: Insertion of Central Venous Catheter Indications: Assessment of intravascular volume, Drug and/or fluid administration and Frequent blood sampling  Procedure Details Consent: Risks of procedure as well as the alternatives and risks of each were explained to the (patient/caregiver).  Consent for procedure obtained. Time Out: Verified patient identification, verified procedure, site/side was marked, verified correct patient position, special equipment/implants available, medications/allergies/relevent history reviewed, required imaging and test results available.  Performed  Maximum sterile technique was used including antiseptics, cap, gloves, gown, hand hygiene, mask and sheet. Skin prep: Chlorhexidine; local anesthetic administered A antimicrobial bonded/coated triple lumen catheter was placed in the left internal jugular vein using the Seldinger technique.  Evaluation Blood flow good Complications: No apparent complications Patient did tolerate procedure well. Chest X-ray ordered to verify placement.  CXR: pending.  Procedure performed under direct ultrasound guidance for real time vessel cannulation.      Tom Nguyen, [place] Tom [facility] Pager: [phone]  or [phone] 09/30/2015, 1:30 PM  Tom Nguyen, M.D. [facility] Pulmonary/Critical Care Medicine. Pager: [phone]. After hours pager: [phone].

## 2015-09-30 NOTE — H&P (Signed)
History & Physical    Patient ID: Tom Nguyen MRN: [id number], DOB/AGE: 80/06/1932   Admit date: 09/24/2015  Primary Physician: Olevia PerchesMegan Johnson, DO Primary Cardiologist: Dr. Mariah MillingGollan   History of Present Illness    Tom Nguyen is a 82 y.o. male with past medical history of CAD (s/p CABG in 1995, cath in 07/2015 with no options for revascularization), chronic combined CHF (NYHA class 3/ICM with EF of 25-30% by echo, s/p [employer] Bi-V ICD), underlying LBBB, chronic respiratory failure 2/2 COPD with long history of tobacco abuse, and OSA (on CPAP)who [facility]2015 with chest pain, SOB, diaphoresis, and palpitations found to be in symptomatic sustained VT upon arrival to the ED.   He underwent defibrillation at 150J with conversion to NSR. Electrolytes were within normal limits. CXR showed cardiomegaly with no active disease. He was started on Heparin and IV Amiodarone at time of admission.  Past Medical History   The following morning, no episodes of recurrent VT were noted. He was switched to PO Amiodarone and his device was interrogated by the [employer] representative with sensing threshold decreased from 175 bpm to 145bpm.  He was started on IV Lasix at time of admission, but this was later held as his creatinine continued to trend upwards (was 1.77 on admission, 1.4 in 08/2015). Nephrology was consulted on 09/26/2015 for his AKI, as creatinine was up to 2.7. This was thought to be secondary to ATN or acute cardiorenal syndrome. Renal US showed no evidence of hydronephrosis.   On 9/11, his creatinine was elevated to 3.52 while still appearing volume overloaded on physical exam. The situation was discussed with Nephrology and restarting him on PO Lasix 40mg  BID was initiated.   He had minimal urine output and creatinine was up to 3.54 on 9/12, therefore a trial of low-dose dopamine was initiated. Unfortunately, with the initiation of this, he developed VT with HR  into the 140's. No shock was delivered but APT therapy noted. Converted back to NSR after 8 minutes. With his episode of VT, Dopamine was discontinued and no further inotropes were initiated.   On 9/13, creatinine was continuing to trend upwards to 3.73. Still reporting dyspnea with volume overload noted by physical exam. The patient voiced wanting to pursue aggressive treatment options, therefore he was [facility] to [facility] for further evaluation.     Past Medical History:  Diagnosis Date  . Arthritis   . Asthma   . CAD (coronary artery disease)    a. s/p CABG 1995; b. stress test 2006: inf post lat infarction w/ peri-infarct ischemia, AK inferior wall & HK inferolat wall, EF 28%. unchanged from prior study 07/2002.  Marland Kitchen. Chronic combined systolic and diastolic CHF, NYHA class 3 (HCC)    a. 2007 EF 40%; b. 10/2008: EF 35% by echo; c. echo 10/16: EF 45-50%, sev inf/post wall HK, conduction abnormality, mild MR, mod dilated LA, PASP 47 mm Hg  . Chronic diarrhea   . Chronic respiratory failure (HCC)   . COPD (chronic obstructive pulmonary disease) (HCC)    on 2L o2 at home  . Emphysema of lung (HCC)   . History of GI bleed   . Hyperlipidemia   . Hypertension   . Hypothyroid   . Ischemic cardiomyopathy   . LBBB (left bundle branch block)   . MI (myocardial infarction) (HCC) 1995  . Sleep apnea    a. on CPAP  . Ventricular tachyarrhythmia (HCC) 09/24/2015    Past Surgical  History:  Procedure Laterality Date  . CARDIAC CATHETERIZATION    . CARDIAC CATHETERIZATION N/A 08/11/2015   Procedure: Right/Left Heart Cath and Coronary/Graft Angiography;  Surgeon: Iran Ouch, MD;  Location: [facility] INVASIVE CV LAB;  Service: Cardiovascular;  Laterality: N/A;  . CATARACT EXTRACTION    . CORONARY ARTERY BYPASS GRAFT  1995   5 vessels  . INSERT / REPLACE / REMOVE PACEMAKER     Research officer, political party   . PACEMAKER INSERTION  2002  . REVERSE SHOULDER ARTHROPLASTY Right 2013  . REVERSE TOTAL  SHOULDER ARTHROPLASTY Left 2014  . TOTAL KNEE ARTHROPLASTY  2009   both knees     Allergies  No Known Allergies   Home Medications    Prior to Admission medications   Medication Sig Start Date End Date Taking? Authorizing Provider  acetaminophen (TYLENOL) 500 MG tablet Take 500 mg by mouth every 6 (six) hours as needed.   Yes Historical Provider, MD  albuterol (PROVENTIL HFA;VENTOLIN HFA) 108 (90 Base) MCG/ACT inhaler Inhale 2 puffs into the lungs every 4 (four) hours as needed for wheezing or shortness of breath. 03/05/15  Yes Erin Fulling, MD  alendronate (FOSAMAX) 70 MG tablet Take 70 mg by mouth once a week.  04/08/15  Yes Historical Provider, MD  allopurinol (ZYLOPRIM) 100 MG tablet Take 100 mg by mouth daily.  08/25/14  Yes Historical Provider, MD  amiodarone (PACERONE) 200 MG tablet Take 1 tablet (200 mg total) by mouth daily. 09/02/15  Yes Duke Salvia, MD  carvedilol (COREG) 3.125 MG tablet Take 1 tablet (3.125 mg total) by mouth 2 (two) times daily with a meal. 09/02/15  Yes Duke Salvia, MD  folic acid (FOLVITE) 1 MG tablet Take 1 mg by mouth daily.  08/25/14  Yes Historical Provider, MD  furosemide (LASIX) 40 MG tablet Take 1 tablet (40 mg total) by mouth 2 (two) times daily. 08/26/15  Yes Antonieta Iba, MD  hydroxychloroquine (PLAQUENIL) 200 MG tablet Take 400 mg by mouth daily.  09/26/14  Yes Historical Provider, MD  leflunomide (ARAVA) 20 MG tablet Take 20 mg by mouth daily.  08/25/14  Yes Historical Provider, MD  levothyroxine (SYNTHROID, LEVOTHROID) 75 MCG tablet Take 1 tablet (75 mcg total) by mouth daily before breakfast. 06/12/15  Yes Jayce G Cook, DO  montelukast (SINGULAIR) 10 MG tablet Take 10 mg by mouth daily.  01/25/15  Yes Historical Provider, MD  Multiple Vitamins-Minerals (MULTIVITAMIN ADULT PO) Take 1 tablet by mouth daily.    Yes Historical Provider, MD  nitroGLYCERIN (NITROLINGUAL) 0.4 MG/SPRAY spray Place 1 spray under the tongue every 5 (five) minutes x 3 doses as  needed for chest pain.   Yes Historical Provider, MD  omeprazole (PRILOSEC) 20 MG capsule Take 1 capsule (20 mg total) by mouth daily. 06/17/15  Yes [name], DO  Potassium Chloride ER 20 MEQ TBCR Take 20 mEq by mouth daily. 08/26/15  Yes Antonieta Iba, MD  sacubitril-valsartan (ENTRESTO) 24-26 MG Take 1 tablet by mouth 2 (two) times daily. 09/15/15  Yes Antonieta Iba, MD  simvastatin (ZOCOR) 40 MG tablet Take 1 tablet (40 mg total) by mouth daily. 08/26/15  Yes Antonieta Iba, MD  umeclidinium-vilanterol (ANORO ELLIPTA) 62.5-25 MCG/INH AEPB Inhale 1 puff into the lungs daily. 08/26/15  Yes Merwyn Katos, MD  albuterol (PROVENTIL) (2.5 MG/3ML) 0.083% nebulizer solution Take 3 mLs (2.5 mg total) by nebulization every 4 (four) hours. For wheezing/SOB Patient not taking: Reported on 09/24/2015  03/05/15   Erin Fulling, MD  amiodarone (PACERONE) 400 MG tablet Take 1 tablet (400 mg total) by mouth 2 (two) times daily. 09/30/15   Katha Hamming, MD  clopidogrel (PLAVIX) 75 MG tablet Take 1 tablet (75 mg total) by mouth daily. 10/01/15   Katha Hamming, MD    Family History    Family History  Problem Relation Age of Onset  . Hyperlipidemia Father   . Stroke Father   . Hypertension Father   . Kidney disease Father   . Kidney failure Mother     Was on dialysis    Social History    Social History   Social History  . Marital status: Married    Spouse name: N/A  . Number of children: N/A  . Years of education: N/A   Occupational History  . Not on file.   Social History Main Topics  . Smoking status: Former Smoker    Quit date: 02/15/1993  . Smokeless tobacco: Never Used     Comment: Quit in 1995  . Alcohol use 0.0 - 0.6 oz/week     Comment: occasional  . Drug use: No  . Sexual activity: Not on file   Other Topics Concern  . Not on file   Social History Narrative   Lives at home with wife, has both cane and a walker.   Mobility limited due to breathing/dyspnea      Review of Systems    General:  No chills, fever, night sweats or weight changes.  Cardiovascular:  No chest pain, palpitations, paroxysmal nocturnal dyspnea. Positive for dyspnea, orthopnea, and lower extremity edema.  Dermatological: No rash, lesions/masses Respiratory: No cough, Positive for dyspnea Urologic: No hematuria, dysuria Abdominal:   No nausea, vomiting, diarrhea, melena, or hematemesis. Positive for bright red blood per rectum (reports known hemorrhoids). Neurologic:  No visual changes, wkns, changes in mental status. All other systems reviewed and are otherwise negative except as noted above.  Physical Exam    Blood pressure (!) 103/48, pulse 71, temperature 98.2 F (36.8 C), temperature source Oral, resp. rate 18, height 5\' 8"  (1.727 m), weight 188 lb 8 oz (85.5 kg), SpO2 92 %.  General: Elderly Caucasian male appearing in no acute distress. Head: Normocephalic, atraumatic, sclera non-icteric, no xanthomas, nares are without discharge. Neck: No carotid bruits. JVD at 10cm.  Lungs: Respirations regular and unlabored, decreased breath sounds at bases bilaterally.  Heart: Regular rate and rhythm. No S3 or S4.  No rubs, or gallops appreciated. 2/6 SEM at LUSB. Abdomen: Soft, non-tender, non-distended with normoactive bowel sounds. No hepatomegaly. No rebound/guarding. No obvious abdominal masses. Msk:  Strength and tone appear normal for age. No joint deformities or effusions. Extremities: No clubbing or cyanosis. Trace lower extremity edema bilaterally.  Distal pedal pulses are 2+ bilaterally. Neuro: Alert and oriented X 3. Moves all extremities spontaneously. No focal deficits noted. Psych:  Responds to questions appropriately with a normal affect. Skin: No rashes or lesions noted  Labs    Troponin (Point of Care Test) No results for input(s): TROPIPOC in the last 72 hours. No results for input(s): CKTOTAL, CKMB, TROPONINI in the last 72 hours. Lab Results  Component  Value Date   WBC 8.1 09/26/2015   HGB 11.5 (L) 09/26/2015   HCT 33.1 (L) 09/26/2015   MCV 92.9 09/26/2015   PLT 101 (L) 09/26/2015    Recent Labs Lab 09/24/15 1927  09/30/15 0459  NA 136  < > 130*  K 4.2  < >  4.4  CL 101  < > 99*  CO2 21*  < > 20*  BUN 32*  < > 65*  CREATININE 1.77*  < > 3.73*  CALCIUM 8.9  < > 8.3*  PROT 6.5  --   --   BILITOT 1.6*  --   --   ALKPHOS 97  --   --   ALT 26  --   --   AST 42*  --   --   GLUCOSE 133*  < > 122*  < > = values in this interval not displayed. Lab Results  Component Value Date   CHOL 91 10/20/2014   HDL 40.00 10/20/2014   LDLCALC 28 10/20/2014   TRIG 112.0 10/20/2014   No results found for: DDIMER   B Natriuretic Peptide  Date/Time Value Ref Range Status  08/16/2015 04:08 AM 2,409.0 (H) 0.0 - 100.0 pg/mL Final  08/06/2015 10:31 AM 2,004.0 (H) 0.0 - 100.0 pg/mL Final  04/13/2015 07:47 AM >4,500.0 (H) 0.0 - 100.0 pg/mL Final    Comment:    RESULT CONFIRMED BY MANUAL DILUTION   No results found for: PROBNP No results for input(s): INR in the last 72 hours.    Radiology Studies    US Renal  Result Date: 09/27/2015 CLINICAL DATA:  Acute renal failure. EXAM: RENAL / URINARY TRACT ULTRASOUND COMPLETE COMPARISON:  08/07/2015 FINDINGS: Right Kidney: Length: 9.9 cm. Cortical thinning noted with slight increased cortical echogenicity. No hydronephrosis. Left Kidney: Length: 11.5 cm multiple simple appearing cysts are identified, as before. No hydronephrosis. Echogenicity within normal limits. No mass or hydronephrosis visualized. Bladder: Appears normal for degree of bladder distention. Other:  Small volume ascites noted with 2.8 cm hepatic cyst evident. IMPRESSION: No evidence for hydronephrosis. Electronically Signed   By: Kennith Center M.D.   On: 09/27/2015 10:30   Dg Chest Port 1 View  Result Date: 09/25/2015 CLINICAL DATA:  82 year old male with shortness of breath. EXAM: PORTABLE CHEST 1 VIEW COMPARISON:  Chest radiograph  dated 09/24/2015 FINDINGS: There is stable moderate cardiomegaly with minimal central vascular prominence. There is no focal consolidation, pleural effusion, or pneumothorax. Right apical pleural thickening. Left pectoral AICD device and median sternotomy wires noted. There is osteopenia with degenerative changes of the spine. Bilateral shoulder arthroplasty. No acute fracture. IMPRESSION: Cardiomegaly with probable mild congestive changes. No focal consolidation or edema. Electronically Signed   By: Elgie Collard M.D.   On: 09/25/2015 02:59   Dg Chest Port 1 View  Result Date: 09/24/2015 CLINICAL DATA:  Dyspnea EXAM: PORTABLE CHEST 1 VIEW COMPARISON:  08/12/2015 FINDINGS: Cardiomegaly again noted. Status post median sternotomy. 3 leads cardiac pacemaker is unchanged in position. No infiltrate or pulmonary edema. Bilateral shoulder prosthesis again noted. IMPRESSION: Cardiomegaly. No active disease. Cardiac pacemaker unchanged in position. Electronically Signed   By: Natasha Mead M.D.   On: 09/24/2015 20:24    EKG & Cardiac Imaging    Cardiac Catheterization: 08/11/2015  Mid RCA lesion, 100 %stenosed.  Prox RCA lesion, 80 %stenosed.  SVG graft was visualized by angiography and is normal in caliber and anatomically normal.  Ost 1st Diag to 1st Diag lesion, 100 %stenosed.  Ost LM to LM lesion, 30 %stenosed.  Ost Cx to Prox Cx lesion, 100 %stenosed.  Ost LAD lesion, 70 %stenosed.  SVG.  Origin lesion, 100 %stenosed.  SVG graft was visualized by angiography and is large.  The graft exhibits mild .  There is no aortic valve stenosis.  Hemodynamic findings consistent with  moderate pulmonary hypertension.   1. Right heart catheterization showed moderate to severe pulmonary hypertension with PA pressure of 55 reports 31 mmHg with pulmonary wedge pressure of 32 mmHg and severely reduced cardiac output at 2.95 with a cardiac index of 1.59. 2. Significant underlying three-vessel coronary  artery disease with patent SVG to diagonal and SVG to right PDA. SVG to left circumflex is occluded. Native LAD has 70% proximal heavily calcified stenosis. 3. Severely reduced LV systolic function by echocardiogram. Left ventricular angiography was not performed.  Recommendations: The patient is still significantly volume overloaded with severely reduced cardiac output. I resumed IV diuresis. He was noted to have intermittent episodes of tachycardia throughout the case and thus I increased the dose of carvedilol to 6.25 mg twice daily. There are no good options for revascularization. I doubt that intervening on the LAD would make a difference in his LV systolic function.  Echocardiogram: 08/06/2015  Study Conclusions  - Left ventricle: The cavity size was moderately dilated. Systolic function was severely reduced. The estimated ejection fraction was in the range of 25% to 30%. Diffuse hypokinesis. Severe hypokinesis, possible akinesis of the inferior/posterior myocardium. Doppler parameters are consistent with abnormal left ventricular relaxation. - Aorta: Aortic root is mildly dilated, dimension: 37 mm (ED). - Mitral valve: There was moderate regurgitation. - Left atrium: The atrium was moderately to severely dilated. - Right ventricle: The cavity size was moderately dilated. Wall thickness was normal. Systolic function was moderately reduced. - Tricuspid valve: There was moderate regurgitation. - Pulmonary arteries: Systolic pressure was moderately elevated. PA peak pressure: 58 mm Hg (S). - Inferior vena cava: The vessel was dilated. The respirophasic diameter changes were blunted (<50%), consistent with elevated central venous pressure.  Assessment & Plan    1. Acute on chronic systolic CHF - EF 25-30% by echo in 7/17, secondary toischemic cardiomyopathy. RHC in 7/17 showed low cardiac output. Seen by Dr. Shirlee Latch and thought to have low output heart failure  with a component of cardiorenal syndrome. Recommend trying to avoid Milrinone gtt use with recent sustained VT.  - No Entresto with low SBP. No ACE-I/ABR/Spironolactone with AKI.  - resumed on PO Lasix 40mg  BID on 9/11, with minimal output of 550 cc. Started on low-dose Dopamine on 9/12 but HR went up to 140 with VT noted on the monitor. Was discontinued. Would avoid inotropic therapy at this time. - Dr. Shirlee Latch to see later today for further recommendations. Not currently an LVAD candidate with worsening renal function.  2. VT - Appropriate ICD discharge, thought to be scar medicated. No further sustained VT noted on telemetry while admitted. - started on IV Amiodarone at time of admission. Has been transitioned to PO Amiodarone400 mg BID. Had 8 minute episode of VT on 9/12 while receiving low-dose Amiodarone with ICD not firing. May need device reprogrammed if he has recurrent runs of VT. - has been started on low-dose BB (hold parameters in place with recent episodes of hypotension).  3. CAD - s/p CABG in 1995. Had LHC in 07/2015 with no options for revascularization. Would not repeat cath (especially with AKI).  - cyclic troponin values have been elevated at 0.05, 1.18, and 0.89. Thought to be secondaryto demand ischemia with volume overload and ICD discharge.  - Continue Plavix, statin, and recently started BB.   4. Acute on Chronic Stage 3 CKD  - Most likely cardiorenal syndrome in the setting of known low cardiac output based on RHC in 7/17.  - creatinine 1.77  on admission, elevated to 3.73 on 09/30/2015. - Nephrology following at [facility] Patient and family made aware of possible need for HD. Renal US negative for hydronephrosis.   5. Deconditioning - evaluated by PT --> Home Health PT recommended. - will need repeat eval at [facility] prior to hospital discharge.   Signed, Ellsworth Lennox, PA-C 09/30/2015, 10:37 AM Pager: [phone]

## 2015-09-30 NOTE — Progress Notes (Signed)
Pt has home NIV device, o2 humidity provided in the machine. Pt is stable at this time no distress noted. Pt receiving neb.

## 2015-09-30 NOTE — Progress Notes (Signed)
Portable chest xray for line placement was done, radiologist read the line as follows  FINDINGS: Left pacer remains in place, unchanged. Interval placement of left internal jugular central line. The tip projects superiorly into the lateral wall of the SVC or possibly into the lower right innominate vein. No pneumothorax. There is cardiomegaly with vascular congestion. Bibasilar atelectasis and small effusions.   [name], PA called to verify ability to use, and came to pull the line back 2 cm.  No new xray ordered , blood return in all ports, Ok to use per Rahul.    Aretta Nip, RN

## 2015-10-01 ENCOUNTER — Encounter (HOSPITAL_COMMUNITY): Payer: Self-pay | Admitting: Emergency Medicine

## 2015-10-01 DIAGNOSIS — I472 Ventricular tachycardia: Principal | ICD-10-CM

## 2015-10-01 LAB — CBC
HEMATOCRIT: 25.9 % — AB (ref 39.0–52.0)
Hemoglobin: 8.8 g/dL — ABNORMAL LOW (ref 13.0–17.0)
MCH: 30.7 pg (ref 26.0–34.0)
MCHC: 34 g/dL (ref 30.0–36.0)
MCV: 90.2 fL (ref 78.0–100.0)
PLATELETS: 117 10*3/uL — AB (ref 150–400)
RBC: 2.87 MIL/uL — ABNORMAL LOW (ref 4.22–5.81)
RDW: 18.6 % — AB (ref 11.5–15.5)
WBC: 8.8 10*3/uL (ref 4.0–10.5)

## 2015-10-01 LAB — BASIC METABOLIC PANEL
ANION GAP: 12 (ref 5–15)
Anion gap: 11 (ref 5–15)
BUN: 63 mg/dL — AB (ref 6–20)
BUN: 67 mg/dL — ABNORMAL HIGH (ref 6–20)
CHLORIDE: 96 mmol/L — AB (ref 101–111)
CO2: 21 mmol/L — ABNORMAL LOW (ref 22–32)
CO2: 22 mmol/L (ref 22–32)
CREATININE: 3.62 mg/dL — AB (ref 0.61–1.24)
Calcium: 7.6 mg/dL — ABNORMAL LOW (ref 8.9–10.3)
Calcium: 8 mg/dL — ABNORMAL LOW (ref 8.9–10.3)
Chloride: 99 mmol/L — ABNORMAL LOW (ref 101–111)
Creatinine, Ser: 3.81 mg/dL — ABNORMAL HIGH (ref 0.61–1.24)
GFR calc Af Amer: 17 mL/min — ABNORMAL LOW (ref >60)
GFR calc non Af Amer: 14 mL/min — ABNORMAL LOW (ref >60)
GFR, EST AFRICAN AMERICAN: 16 mL/min — AB (ref >60)
GFR, EST NON AFRICAN AMERICAN: 15 mL/min — AB (ref >60)
GLUCOSE: 102 mg/dL — AB (ref 65–99)
Glucose, Bld: 112 mg/dL — ABNORMAL HIGH (ref 65–99)
POTASSIUM: 4.7 mmol/L (ref 3.5–5.1)
Potassium: 4.7 mmol/L (ref 3.5–5.1)
Sodium: 131 mmol/L — ABNORMAL LOW (ref 135–145)
Sodium: 130 mmol/L — ABNORMAL LOW (ref 135–145)

## 2015-10-01 LAB — CARBOXYHEMOGLOBIN
Carboxyhemoglobin: 1.3 % (ref 0.5–1.5)
Methemoglobin: 0.8 % (ref 0.0–1.5)
O2 SAT: 67.8 %
Total hemoglobin: 9 g/dL — ABNORMAL LOW (ref 12.0–16.0)

## 2015-10-01 LAB — UREA NITROGEN, URINE: UREA NITROGEN UR: 462 mg/dL

## 2015-10-01 MED ORDER — DIPHENHYDRAMINE HCL 12.5 MG/5ML PO ELIX
12.5000 mg | ORAL_SOLUTION | Freq: Once | ORAL | Status: AC
  Administered 2015-10-01: 12.5 mg via ORAL
  Filled 2015-10-01: qty 5

## 2015-10-01 MED ORDER — FUROSEMIDE 10 MG/ML IJ SOLN
160.0000 mg | Freq: Two times a day (BID) | INTRAVENOUS | Status: DC
  Administered 2015-10-01: 160 mg via INTRAVENOUS
  Filled 2015-10-01 (×3): qty 16

## 2015-10-01 MED ORDER — FUROSEMIDE 10 MG/ML IJ SOLN
160.0000 mg | Freq: Three times a day (TID) | INTRAVENOUS | Status: DC
  Administered 2015-10-01 – 2015-10-06 (×15): 160 mg via INTRAVENOUS
  Filled 2015-10-01 (×17): qty 16

## 2015-10-01 MED ORDER — IPRATROPIUM-ALBUTEROL 0.5-2.5 (3) MG/3ML IN SOLN
3.0000 mL | RESPIRATORY_TRACT | Status: DC | PRN
  Administered 2015-10-08: 3 mL via RESPIRATORY_TRACT
  Filled 2015-10-01: qty 3

## 2015-10-01 MED ORDER — METOLAZONE 5 MG PO TABS
5.0000 mg | ORAL_TABLET | Freq: Once | ORAL | Status: AC
Start: 2015-10-01 — End: 2015-10-01
  Administered 2015-10-01: 5 mg via ORAL
  Filled 2015-10-01: qty 1

## 2015-10-01 NOTE — Progress Notes (Signed)
Pt has voided ~175cc urine since AM dose of 160mg  IV lasix. Bladder scan showed ~150cc of urine. This RN observed pt's WOB has increased with Dyspnea at rest, and prolonged expirations. SpO2 96-100% on RA. Pt verbalized he does feel more SOB. Dr. notified. Verbal orders given to increase frequency of IV lasix to Q8h including now, and to give one time dose of metolazone 5mg . Dr. Shirlee Latch with Dr. notified and agreed with plan. Will continue to monitor and assess pt closely.

## 2015-10-01 NOTE — Progress Notes (Signed)
S: Tom Nguyen is resting comfortably this morning, no longer on supplemental oxygen. He was started on milrinone and amiodarone infusions without any arrhythmia or complaints.  O:BP 106/61 (BP Location: Right Arm)   Pulse 66   Temp 97.6 F (36.4 C) (Oral)   Resp 13   Ht 5\' 8"  (1.727 m)   Wt 181 lb (82.1 kg)   SpO2 100%   BMI 27.52 kg/m   Intake/Output Summary (Last 24 hours) at 10/01/15 0856 Last data filed at 10/01/15 0815  Gross per 24 hour  Intake          1472.72 ml  Output              600 ml  Net           872.72 ml   Intake/Output: I/O last 3 completed shifts: In: 1380 [P.O.:840; I.V.:540] Out: 500 [Urine:500]  Intake/Output this shift:  Total I/O In: 92.7 [I.V.:26.7; IV Piggyback:66] Out: 100 [Urine:100] Weight change:  Gen: Pleasant elderly man in no acute distress CVS: RRR, no murmurs Resp: Bibasilar crackles, normal WOB Abd: Mild abdominal wall edema on dependent position Ext: 1+ pitting lower and upper extremity edema   Recent Labs Lab 09/24/15 1927 09/25/15 0555 09/26/15 0449 09/27/15 0702 09/28/15 0543 09/29/15 0459 09/30/15 0459 10/01/15 0446  NA 136 134* 133* 131* 133* 133* 130* 131*  K 4.2 4.3 4.4 4.4 4.2 4.0 4.4 4.7  CL 101 103 101 99* 99* 101 99* 99*  CO2 21* 20* 19* 19* 22 22 20* 21*  GLUCOSE 133* 125* 101* 91 93 96 122* 102*  BUN 32* 33* 41* 48* 52* 58* 65* 63*  CREATININE 1.77* 2.05* 2.73* 3.16* 3.52* 3.54* 3.73* 3.62*  ALBUMIN 3.6  --   --   --   --   --   --   --   CALCIUM 8.9 8.2* 8.1* 8.1* 8.2* 8.3* 8.3* 7.6*  PHOS  --  3.5 4.0 4.1 4.3 4.1 4.0  --   AST 42*  --   --   --   --   --   --   --   ALT 26  --   --   --   --   --   --   --    Liver Function Tests:  Recent Labs Lab 09/24/15 1927  AST 42*  ALT 26  ALKPHOS 97  BILITOT 1.6*  PROT 6.5  ALBUMIN 3.6   CBC:  Recent Labs Lab 09/24/15 1927 09/25/15 0555 09/26/15 0449 10/01/15 0446  WBC 7.5 8.3 8.1 8.8  HGB 11.1* 11.5* 11.5* 8.8*  HCT 33.2* 33.9* 33.1* 25.9*  MCV  95.6 94.7 92.9 90.2  PLT 120* 109* 101* 117*   Cardiac Enzymes:  Recent Labs Lab 09/24/15 1927 09/25/15 0900 09/25/15 1722 09/26/15 0449  CKTOTAL  --   --   --  58  TROPONINI 0.05* 1.18* 0.89*  --    CBG:  Recent Labs Lab 09/24/15 2226  GLUCAP 142*    Iron Studies: No results for input(s): IRON, TIBC, TRANSFERRIN, FERRITIN in the last 72 hours. Studies/Results: Dg Chest Port 1 View  Result Date: 09/30/2015 CLINICAL DATA:  Central line placement EXAM: PORTABLE CHEST 1 VIEW COMPARISON:  09/25/2015 FINDINGS: Left pacer remains in place, unchanged. Interval placement of left internal jugular central line. The tip projects superiorly into the lateral wall of the SVC or possibly into the lower right innominate vein. No pneumothorax. There is cardiomegaly with vascular congestion. Bibasilar atelectasis and  small effusions. IMPRESSION: Left PICC line tip projects superiorly into the lateral wall of the SVC or possibly into the lower portion of the right innominate vein. Cardiomegaly with vascular congestion. Small effusions with bibasilar atelectasis. Electronically Signed   By: Charlett Nose M.D.   On: 09/30/2015 14:10   . allopurinol  100 mg Oral Daily  . arformoterol  15 mcg Nebulization BID   And  . umeclidinium bromide  1 puff Inhalation Daily  . carvedilol  3.125 mg Oral BID WC  . clopidogrel  75 mg Oral Daily  . folic acid  1 mg Oral Daily  . furosemide  160 mg Intravenous BID  . heparin  5,000 Units Subcutaneous Q8H  . Influenza vac split quadrivalent PF  0.5 mL Intramuscular Tomorrow-1000  . ipratropium-albuterol  3 mL Nebulization TID  . leflunomide  20 mg Oral Daily  . levothyroxine  75 mcg Oral QAC breakfast  . mouth rinse  15 mL Mouth Rinse BID  . montelukast  10 mg Oral Daily  . pantoprazole  40 mg Oral Daily  . simvastatin  40 mg Oral q1800  . sodium chloride flush  3 mL Intravenous Q12H    BMET    Component Value Date/Time   NA 131 (L) 10/01/2015 0446   NA  137 09/01/2015 1132   K 4.7 10/01/2015 0446   CL 99 (L) 10/01/2015 0446   CO2 21 (L) 10/01/2015 0446   GLUCOSE 102 (H) 10/01/2015 0446   BUN 63 (H) 10/01/2015 0446   BUN 28 (H) 09/01/2015 1132   CREATININE 3.62 (H) 10/01/2015 0446   CALCIUM 7.6 (L) 10/01/2015 0446   GFRNONAA 15 (L) 10/01/2015 0446   GFRAA 17 (L) 10/01/2015 0446   CBC    Component Value Date/Time   WBC 8.8 10/01/2015 0446   RBC 2.87 (L) 10/01/2015 0446   HGB 8.8 (L) 10/01/2015 0446   HCT 25.9 (L) 10/01/2015 0446   HCT 31.0 (L) 09/01/2015 1132   PLT 117 (L) 10/01/2015 0446   PLT 142 (L) 09/01/2015 1132   MCV 90.2 10/01/2015 0446   MCV 95 09/01/2015 1132   MCH 30.7 10/01/2015 0446   MCHC 34.0 10/01/2015 0446   RDW 18.6 (H) 10/01/2015 0446   RDW 17.2 (H) 09/01/2015 1132   LYMPHSABS 0.7 09/01/2015 1132   MONOABS 0.9 04/13/2015 0747   EOSABS 0.1 09/01/2015 1132   BASOSABS 0.0 09/01/2015 1132     Assessment/Plan:  1. Acute renal failure on CKD stage 3: Mr. Dzikowski showed a small response to IV Bumex yesterday and slight improvement in BUN/SCr this morning. This is still maybe a mixed picture of ATN due to earlier ischemic insult so his response to diuretics is blunted. His urine sodium is appropriately low in this setting of hypervolemia despite recent diuretics suggesting there is fair intrinsic renal function at this time.  We could expect improvement in any cardiorenal component now that he is on milrinone infusion. A CVP > 20 is noted and the plan for repeated diuresis again today. There is no urgent indication for starting dialysis at this time.  2. Combined systolic and diastolic heart failure from ischemic cardiomyopathy: Diuresis and continue ionotropes per Heart Failure team  3. Vtach: S/p defibrillation and later chemical cardioversion with ATP, he remains in a normal rhythm   Fuller Plan, MD PGY-II Internal Medicine Resident Pager# [phone] 10/01/2015, 8:56 AM

## 2015-10-01 NOTE — Progress Notes (Signed)
Patient ID: Bill Yohn, male   DOB: 10-02-1932, 82 y.o.   MRN: [id number]     SUBJECTIVE: Patient transferred from [facility] 9/13.  He was initially admitted to [facility] after VT with ICD discharge, then developed progressive renal dysfunction with volume overload.  Creatinine up to 3.73.  He was started on dopamine 9/12 but had VT in 140s terminated by ATP.    He got 2 doses of IV bumetanide yesterday with about 325 cc UOP.  CVP remains > 20.  Now on milrinone at 0.125 mcg/kg/min without VT, co-ox good at 68%.   He is comfortable at rest, currently in NSR with BiV pacing.   Scheduled Meds: . allopurinol  100 mg Oral Daily  . arformoterol  15 mcg Nebulization BID   And  . umeclidinium bromide  1 puff Inhalation Daily  . carvedilol  3.125 mg Oral BID WC  . clopidogrel  75 mg Oral Daily  . folic acid  1 mg Oral Daily  . furosemide  160 mg Intravenous BID  . heparin  5,000 Units Subcutaneous Q8H  . Influenza vac split quadrivalent PF  0.5 mL Intramuscular Tomorrow-1000  . ipratropium-albuterol  3 mL Nebulization TID  . leflunomide  20 mg Oral Daily  . levothyroxine  75 mcg Oral QAC breakfast  . mouth rinse  15 mL Mouth Rinse BID  . montelukast  10 mg Oral Daily  . pantoprazole  40 mg Oral Daily  . simvastatin  40 mg Oral q1800  . sodium chloride flush  3 mL Intravenous Q12H   Continuous Infusions: . amiodarone 30 mg/hr (10/01/15 0416)  . milrinone 0.125 mcg/kg/min (09/30/15 1550)   PRN Meds:.sodium chloride, acetaminophen, nitroGLYCERIN, ondansetron (ZOFRAN) IV, sodium chloride flush    Vitals:   10/01/15 0400 10/01/15 0500 10/01/15 0600 10/01/15 0700  BP: (!) 96/55 105/62 101/74 (!) 97/55  Pulse: 64 65 68 67  Resp: 14 16 17 14   Temp:      TempSrc:      SpO2: 97% 97% 95% 96%  Weight: 181 lb (82.1 kg)     Height: 5\' 8"  (1.727 m)       Intake/Output Summary (Last 24 hours) at 10/01/15 0735 Last data filed at 10/01/15 0700  Gross per 24 hour  Intake          1373.02 ml  Output               500 ml  Net           873.02 ml    LABS: Basic Metabolic Panel:  Recent Labs  10/03/15 0459 09/30/15 0459 10/01/15 0446  NA 133* 130* 131*  K 4.0 4.4 4.7  CL 101 99* 99*  CO2 22 20* 21*  GLUCOSE 96 122* 102*  BUN 58* 65* 63*  CREATININE 3.54* 3.73* 3.62*  CALCIUM 8.3* 8.3* 7.6*  PHOS 4.1 4.0  --    Liver Function Tests: No results for input(s): AST, ALT, ALKPHOS, BILITOT, PROT, ALBUMIN in the last 72 hours. No results for input(s): LIPASE, AMYLASE in the last 72 hours. CBC:  Recent Labs  10/01/15 0446  WBC 8.8  HGB 8.8*  HCT 25.9*  MCV 90.2  PLT 117*   Cardiac Enzymes: No results for input(s): CKTOTAL, CKMB, CKMBINDEX, TROPONINI in the last 72 hours. BNP: Invalid input(s): POCBNP D-Dimer: No results for input(s): DDIMER in the last 72 hours. Hemoglobin A1C: No results for input(s): HGBA1C in the last 72 hours. Fasting Lipid Panel: No results for  input(s): CHOL, HDL, LDLCALC, TRIG, CHOLHDL, LDLDIRECT in the last 72 hours. Thyroid Function Tests: No results for input(s): TSH, T4TOTAL, T3FREE, THYROIDAB in the last 72 hours.  Invalid input(s): FREET3 Anemia Panel: No results for input(s): VITAMINB12, FOLATE, FERRITIN, TIBC, IRON, RETICCTPCT in the last 72 hours.  RADIOLOGY: US Renal  Result Date: 09/27/2015 CLINICAL DATA:  Acute renal failure. EXAM: RENAL / URINARY TRACT ULTRASOUND COMPLETE COMPARISON:  08/07/2015 FINDINGS: Right Kidney: Length: 9.9 cm. Cortical thinning noted with slight increased cortical echogenicity. No hydronephrosis. Left Kidney: Length: 11.5 cm multiple simple appearing cysts are identified, as before. No hydronephrosis. Echogenicity within normal limits. No mass or hydronephrosis visualized. Bladder: Appears normal for degree of bladder distention. Other:  Small volume ascites noted with 2.8 cm hepatic cyst evident. IMPRESSION: No evidence for hydronephrosis. Electronically Signed   By: Kennith Center M.D.   On: 09/27/2015 10:30    Dg Chest Port 1 View  Result Date: 09/30/2015 CLINICAL DATA:  Central line placement EXAM: PORTABLE CHEST 1 VIEW COMPARISON:  09/25/2015 FINDINGS: Left pacer remains in place, unchanged. Interval placement of left internal jugular central line. The tip projects superiorly into the lateral wall of the SVC or possibly into the lower right innominate vein. No pneumothorax. There is cardiomegaly with vascular congestion. Bibasilar atelectasis and small effusions. IMPRESSION: Left PICC line tip projects superiorly into the lateral wall of the SVC or possibly into the lower portion of the right innominate vein. Cardiomegaly with vascular congestion. Small effusions with bibasilar atelectasis. Electronically Signed   By: Charlett Nose M.D.   On: 09/30/2015 14:10   Dg Chest Port 1 View  Result Date: 09/25/2015 CLINICAL DATA:  82 year old male with shortness of breath. EXAM: PORTABLE CHEST 1 VIEW COMPARISON:  Chest radiograph dated 09/24/2015 FINDINGS: There is stable moderate cardiomegaly with minimal central vascular prominence. There is no focal consolidation, pleural effusion, or pneumothorax. Right apical pleural thickening. Left pectoral AICD device and median sternotomy wires noted. There is osteopenia with degenerative changes of the spine. Bilateral shoulder arthroplasty. No acute fracture. IMPRESSION: Cardiomegaly with probable mild congestive changes. No focal consolidation or edema. Electronically Signed   By: Elgie Collard M.D.   On: 09/25/2015 02:59   Dg Chest Port 1 View  Result Date: 09/24/2015 CLINICAL DATA:  Dyspnea EXAM: PORTABLE CHEST 1 VIEW COMPARISON:  08/12/2015 FINDINGS: Cardiomegaly again noted. Status post median sternotomy. 3 leads cardiac pacemaker is unchanged in position. No infiltrate or pulmonary edema. Bilateral shoulder prosthesis again noted. IMPRESSION: Cardiomegaly. No active disease. Cardiac pacemaker unchanged in position. Electronically Signed   By: Natasha Mead M.D.   On:  09/24/2015 20:24    PHYSICAL EXAM General: NAD Neck: JVP 14 cm, no thyromegaly or thyroid nodule.  Lungs: Crackles at bases bilaterally. CV: Lateral PMI.  Heart sounds distant, regular S1/S2, no S3/S4, no murmur.  1+ edema to knees bilaterally.   Abdomen: Soft, nontender, no hepatosplenomegaly, no distention.  Neurologic: Alert and oriented x 3.  Psych: Normal affect. Extremities: No clubbing or cyanosis.   TELEMETRY: Reviewed telemetry pt in NSR with BiV pacing:  ASSESSMENT AND PLAN: 82 with history of CAD s/p CABG, ischemic cardiomyopathy s/p [employer] CRT-D, paroxysmal atrial fibrillation, CKD admitted with VT and ICD discharge.  At [facility], he developed progressive volume overload and renal dysfunction, had VT again with dopamine use.  Transferred to [facility].  1. VT: Appropriate ICD discharge prior to admission.  Had another VT episode in hospital on dopamine, terminated with ATP. Do not  think this was triggered by ACS, likely scar-mediated. No chest pain.  - Continue amiodarone gtt while on low dose milrinone.  2. CAD: s/p CABG.  Had LHC in 7/17 with no options for revascularization.  Would not repeat cath (especially with AKI). Elevated troponin at admission may be demand ischemia with volume overload and defibrillation.  - Continue Plavix, statin.  3. Acute on chronic systolic CHF: EF 48-18%, moderate MR, moderately decreased RV systolic function, moderate TR on 7/17 echo, ischemic cardiomyopathy.  [employer] CRT-D.  RHC in 7/17 showed low cardiac output and elevated filling pressures (PA 55/31, mean PCWP 32, CI 1.6).  Currently, he is volume overloaded on exam but creatinine is now significantly elevated.  SBP in 90s-100s.  Co-ox not particularly low, now on low-dose milrinone at 0.125 with co-ox 68%.  CVP remains > 20.  Very difficult situation => suspect possible ATN event from cardiac arrest with hypotension at time of initial admission.   - Continue low dose milrinone for  now.  - Minimal UOP with bumetanide yesterday.  Will write for Lasix 160 mg IV bid today.    - In absence of renal recovery, prognosis poor.  4. AKI on CKD stage III: Most likely hemodynamic => suspect ATN event in the setting of initial cardiac arrest with hypotension. Renal following, not a good long-term HD candidate.  However, could envision short course of CVVH to see if he recovers renal function.  5. Atrial fibrillation: History of PAF, has not been anticoagulated however.   Marca Ancona 10/01/2015 7:35 AM

## 2015-10-01 NOTE — Progress Notes (Signed)
CARDIAC REHAB PHASE I   PRE:  Rate/Rhythm: pacing 68  BP:  Supine:   Sitting: 96/55  Standing:    SaO2: 98%RA  MODE:  Ambulation: 110 ft   POST:  Rate/Rhythm: 84 paced  BP:  Supine:   Sitting: 114/62  Standing:    SaO2: 90%RA hall, 99%RA in bed 1405-1442 Pt walked 110 ft on RA with asst x 2 and rolling walker. One asst to follow with chair. Encouraged pt to listen to his body and let us know when ready to turn around. Encouraged him to stop and rest. Pt determined to walk back to room but exhausted at door. Made pt sit and recliner and roll back in room. Encouraged pursed-lip breathing. Pt asked to go back to bed . Assisted  Back to bed and made comfortable.    Luetta Nutting, RN BSN  10/01/2015 2:38 PM

## 2015-10-01 NOTE — Progress Notes (Signed)
Pt's voided ~175cc since 1500 dose of 160mg  IV Lasix. Bladder scan showed ~175cc of urine. Dr. notified. RN instructed to make renal aware. Dr. Shirlee Latch, with Dr. Dimple Casey, says to give diuretics more time, and do not want to initiate CRRT at this time. Will continue to monitor and assess pt closely.

## 2015-10-02 ENCOUNTER — Other Ambulatory Visit: Payer: Medicare Other

## 2015-10-02 LAB — BASIC METABOLIC PANEL
Anion gap: 11 (ref 5–15)
Anion gap: 12 (ref 5–15)
BUN: 69 mg/dL — AB (ref 6–20)
BUN: 69 mg/dL — AB (ref 6–20)
CALCIUM: 7.9 mg/dL — AB (ref 8.9–10.3)
CO2: 23 mmol/L (ref 22–32)
CO2: 22 mmol/L (ref 22–32)
CREATININE: 3.91 mg/dL — AB (ref 0.61–1.24)
Calcium: 7.8 mg/dL — ABNORMAL LOW (ref 8.9–10.3)
Chloride: 96 mmol/L — ABNORMAL LOW (ref 101–111)
Chloride: 98 mmol/L — ABNORMAL LOW (ref 101–111)
Creatinine, Ser: 3.71 mg/dL — ABNORMAL HIGH (ref 0.61–1.24)
GFR calc Af Amer: 16 mL/min — ABNORMAL LOW (ref >60)
GFR calc Af Amer: 15 mL/min — ABNORMAL LOW (ref >60)
GFR calc non Af Amer: 14 mL/min — ABNORMAL LOW (ref >60)
GFR calc non Af Amer: 13 mL/min — ABNORMAL LOW (ref >60)
GLUCOSE: 94 mg/dL (ref 65–99)
GLUCOSE: 89 mg/dL (ref 65–99)
Potassium: 6.2 mmol/L — ABNORMAL HIGH (ref 3.5–5.1)
Potassium: 3.2 mmol/L — ABNORMAL LOW (ref 3.5–5.1)
SODIUM: 130 mmol/L — AB (ref 135–145)
Sodium: 132 mmol/L — ABNORMAL LOW (ref 135–145)

## 2015-10-02 LAB — CBC
HEMATOCRIT: 26.6 % — AB (ref 39.0–52.0)
Hemoglobin: 9 g/dL — ABNORMAL LOW (ref 13.0–17.0)
MCH: 30.3 pg (ref 26.0–34.0)
MCHC: 33.8 g/dL (ref 30.0–36.0)
MCV: 89.6 fL (ref 78.0–100.0)
Platelets: 73 10*3/uL — ABNORMAL LOW (ref 150–400)
RBC: 2.97 MIL/uL — ABNORMAL LOW (ref 4.22–5.81)
RDW: 19.1 % — AB (ref 11.5–15.5)
WBC: 7.6 10*3/uL (ref 4.0–10.5)

## 2015-10-02 LAB — CARBOXYHEMOGLOBIN
Carboxyhemoglobin: 1.3 % (ref 0.5–1.5)
Carboxyhemoglobin: 1.3 % (ref 0.5–1.5)
METHEMOGLOBIN: 0.9 % (ref 0.0–1.5)
Methemoglobin: 1.1 % (ref 0.0–1.5)
O2 Saturation: 60.5 %
O2 Saturation: 68 %
TOTAL HEMOGLOBIN: 7.1 g/dL — AB (ref 12.0–16.0)
Total hemoglobin: 9.2 g/dL — ABNORMAL LOW (ref 12.0–16.0)

## 2015-10-02 LAB — MAGNESIUM: Magnesium: 1.7 mg/dL (ref 1.7–2.4)

## 2015-10-02 MED ORDER — SODIUM CHLORIDE 0.9% FLUSH: 10.0000 mL | INTRAVENOUS | Status: DC | PRN

## 2015-10-02 MED ORDER — POTASSIUM CHLORIDE CRYS ER 20 MEQ PO TBCR
40.0000 meq | EXTENDED_RELEASE_TABLET | Freq: Once | ORAL | Status: AC
  Administered 2015-10-02: 40 meq via ORAL
  Filled 2015-10-02: qty 2

## 2015-10-02 MED ORDER — SODIUM CHLORIDE 0.9% FLUSH
10.0000 mL | Freq: Two times a day (BID) | INTRAVENOUS | Status: DC
  Administered 2015-10-02 (×2): 10 mL
  Administered 2015-10-03: 30 mL
  Administered 2015-10-04 – 2015-10-05 (×3): 10 mL
  Administered 2015-10-06: 30 mL
  Administered 2015-10-07 – 2015-10-10 (×3): 10 mL
  Administered 2015-10-11: 3 mL

## 2015-10-02 NOTE — Evaluation (Signed)
Physical Therapy Evaluation Patient Details Name: Tom Nguyen MRN: [id number] DOB: [birth year] Today's Date: 10/02/2015   History of Present Illness  Tom Nguyen is an 82 year old male with past medical history of Asthma,CAD, CHF- Class 3,COPD, s/p AICD, Hypertension, Hyperlipidemia, MI, and sleep apnea. Patient presented to [facility] 09/24/15 with chest pain and shortness of breath. In the ED the patient was in V-tach and was defibrillated. 09/30/15 transfer to [facility] for consideration of LVAD. Nephrology following due to acute renal failure and ? will need CRRT (pt not a long-term dialysis candidate).     Clinical Impression  Pt admitted with above diagnosis. Evaluation limited due to hypotension. Pt currently with functional limitations due to the deficits listed below (see PT Problem List). Pt may benefit from skilled PT to increase their independence and safety with mobility to allow discharge to the venue listed below.       Follow Up Recommendations Home health PT;Supervision/Assistance - 24 hour    Equipment Recommendations  None recommended by PT    Recommendations for Other Services OT consult     Precautions / Restrictions Precautions Precautions: Fall Precaution Comments: one fall in past 12 months Restrictions Weight Bearing Restrictions: No      Mobility  Bed Mobility               General bed mobility comments: Received upright in recliner and left in chair at end of session. Not assessed  Transfers                 General transfer comment: Deferred due to decr BP  Ambulation/Gait                Stairs            Wheelchair Mobility    Modified Rankin (Stroke Patients Only)       Balance Overall balance assessment: History of Falls (deferred due to hypotension)                                           Pertinent Vitals/Pain See vitals flow sheet.   Pain Assessment: No/denies pain    Home Living  Family/patient expects to be discharged to:: Private residence Living Arrangements: Spouse/significant other Available Help at Discharge: Family Type of Home: House Home Access: Stairs to enter Entrance Stairs-Rails: Right;Left;Can reach both Entrance Stairs-Number of Steps: 2 Home Layout: Two level;Able to live on main level with bedroom/bathroom Home Equipment: Dan Humphreys - 2 wheels;Walker - 4 wheels;Cane - single point;Shower seat;Wheelchair - manual;Other (comment);Grab bars - tub/shower;Bedside commode (Adjustable bed, no grab bars near commode) Additional Comments: 1    Prior Function Level of Independence: Needs assistance   Gait / Transfers Assistance Needed: in community uses rollator, except shopping uses electric cart; inside home uses cane  ADL's / Homemaking Assistance Needed: Intermittent assist with bathing from wife  Comments: Has home O2 but only wears at night with CPAP.     Hand Dominance   Dominant Hand: Left    Extremity/Trunk Assessment   Upper Extremity Assessment: Generalized weakness (arthritic changes bil hands)           Lower Extremity Assessment: Generalized weakness (bil LE edema)         Communication   Communication: No difficulties  Cognition Arousal/Alertness: Awake/alert Behavior During Therapy: Flat affect Overall Cognitive Status: Within Functional Limits for tasks assessed  General Comments General comments (skin integrity, edema, etc.): Wife present.     Exercises General Exercises - Upper Extremity Elbow Flexion: AROM;Both;10 reps Wrist Flexion: AROM;Both;10 reps Wrist Extension: AROM;Both;10 reps Digit Composite Flexion: AROM;Both;10 reps Composite Extension: AROM;Both;10 reps General Exercises - Lower Extremity Ankle Circles/Pumps: AROM;Both;15 reps Quad Sets: AROM;Both;10 reps Hip Flexion/Marching: AROM;Both;10 reps;Seated Heel Raises: AROM;Both;20 reps;Seated   Assessment/Plan    PT  Assessment Patient needs continued PT services  PT Problem List Decreased strength;Decreased activity tolerance;Decreased balance;Cardiopulmonary status limiting activity;Decreased mobility;Decreased knowledge of use of DME       PT Diagnosis  Generalized weakness; Difficulty with gait   Encounter for central line placement - Plan: DG Chest Port 1 [place], DG Chest Port 1 View    PT Treatment Interventions Gait training;Stair training;Therapeutic activities;Therapeutic exercise;Balance training;Patient/family education;DME instruction;Functional mobility training    PT Goals (Current goals can be found in the Care Plan section)  Acute Rehab PT Goals Patient Stated Goal: Return to prior level of function at home PT Goal Formulation: With patient/family Time For Goal Achievement: 10/16/15 Potential to Achieve Goals: Fair    Frequency Min 3X/week   Barriers to discharge        Co-evaluation               End of Session   Activity Tolerance: Treatment limited secondary to medical complications (Comment) (hypotension; dyspnea) Patient left: in chair;with call bell/phone within reach;with family/visitor present Nurse Communication: Other (comment) (hypotensive)         Time: 4034-7425 PT Time Calculation (min) (ACUTE ONLY): 23 min   Charges:   PT Evaluation $PT Eval Moderate Complexity: 1 Procedure PT Treatments $Therapeutic Exercise: 8-22 mins   PT G Codes:        Tom Nguyen 10/05/15, 10:55 AM  Pager [phone]

## 2015-10-02 NOTE — Progress Notes (Signed)
S: Tom Nguyen had some dyspnea yesterday afternoon but urine output improving overnight on continued diuretics  O:BP (!) 96/47   Pulse 68   Temp 97.3 F (36.3 C) (Oral)   Resp 14   Ht 5\' 8"  (1.727 m)   Wt 178 lb 12.7 oz (81.1 kg)   SpO2 93%   BMI 27.19 kg/m   Intake/Output Summary (Last 24 hours) at 10/02/15 1130 Last data filed at 10/02/15 1100  Gross per 24 hour  Intake           1074.8 ml  Output             2095 ml  Net          -1020.2 ml   Intake/Output: I/O last 3 completed shifts: In: 1708.4 [P.O.:480; I.V.:964.4; IV Piggyback:264] Out: 2195 [Urine:2195]  Intake/Output this shift:  Total I/O In: 229.8 [P.O.:120; I.V.:109.8] Out: 400 [Urine:400] Weight change: 10.6 oz (0.3 kg)   Gen: Pleasant elderly man in no acute distress sitting in bedside chair CVS: RRR, no murmurs Resp: Bibasilar crackles, normal WOB Abd: Nontender, nondistended Ext: 1+ pitting lower and upper extremity edema   Recent Labs Lab 09/26/15 0449 09/27/15 0702 09/28/15 0543 09/29/15 0459 09/30/15 0459 10/01/15 0446 10/01/15 1741 10/02/15 0310 10/02/15 0730  NA 133* 131* 133* 133* 130* 131* 130* 130* 132*  K 4.4 4.4 4.2 4.0 4.4 4.7 4.7 6.2* 3.2*  CL 101 99* 99* 101 99* 99* 96* 96* 98*  CO2 19* 19* 22 22 20* 21* 22 23 22   GLUCOSE 101* 91 93 96 122* 102* 112* 94 89  BUN 41* 48* 52* 58* 65* 63* 67* 69* 69*  CREATININE 2.73* 3.16* 3.52* 3.54* 3.73* 3.62* 3.81* 3.71* 3.91*  CALCIUM 8.1* 8.1* 8.2* 8.3* 8.3* 7.6* 8.0* 7.8* 7.9*  PHOS 4.0 4.1 4.3 4.1 4.0  --   --   --   --    CBC:  Recent Labs Lab 09/26/15 0449 10/01/15 0446 10/02/15 0615  WBC 8.1 8.8 7.6  HGB 11.5* 8.8* 9.0*  HCT 33.1* 25.9* 26.6*  MCV 92.9 90.2 89.6  PLT 101* 117* 73*   Cardiac Enzymes:  Recent Labs Lab 09/25/15 1722 09/26/15 0449  CKTOTAL  --  58  TROPONINI 0.89*  --    CBG: No results for input(s): GLUCAP in the last 168 hours.  Iron Studies: No results for input(s): IRON, TIBC, TRANSFERRIN, FERRITIN  in the last 72 hours. Studies/Results: Dg Chest Port 1 View  Result Date: 09/30/2015 CLINICAL DATA:  Central line placement EXAM: PORTABLE CHEST 1 VIEW COMPARISON:  09/25/2015 FINDINGS: Left pacer remains in place, unchanged. Interval placement of left internal jugular central line. The tip projects superiorly into the lateral wall of the SVC or possibly into the lower right innominate vein. No pneumothorax. There is cardiomegaly with vascular congestion. Bibasilar atelectasis and small effusions. IMPRESSION: Left PICC line tip projects superiorly into the lateral wall of the SVC or possibly into the lower portion of the right innominate vein. Cardiomegaly with vascular congestion. Small effusions with bibasilar atelectasis. Electronically Signed   By: 10/02/2015 M.D.   On: 09/30/2015 14:10   . allopurinol  100 mg Oral Daily  . arformoterol  15 mcg Nebulization BID   And  . umeclidinium bromide  1 puff Inhalation Daily  . carvedilol  3.125 mg Oral BID WC  . clopidogrel  75 mg Oral Daily  . folic acid  1 mg Oral Daily  . furosemide  160 mg Intravenous Q8H  .  heparin  5,000 Units Subcutaneous Q8H  . leflunomide  20 mg Oral Daily  . levothyroxine  75 mcg Oral QAC breakfast  . mouth rinse  15 mL Mouth Rinse BID  . montelukast  10 mg Oral Daily  . pantoprazole  40 mg Oral Daily  . simvastatin  40 mg Oral q1800  . sodium chloride flush  10-40 mL Intracatheter Q12H  . sodium chloride flush  3 mL Intravenous Q12H    BMET    Component Value Date/Time   NA 132 (L) 10/02/2015 0730   NA 137 09/01/2015 1132   K 3.2 (L) 10/02/2015 0730   CL 98 (L) 10/02/2015 0730   CO2 22 10/02/2015 0730   GLUCOSE 89 10/02/2015 0730   BUN 69 (H) 10/02/2015 0730   BUN 28 (H) 09/01/2015 1132   CREATININE 3.91 (H) 10/02/2015 0730   CALCIUM 7.9 (L) 10/02/2015 0730   GFRNONAA 13 (L) 10/02/2015 0730   GFRAA 15 (L) 10/02/2015 0730   CBC    Component Value Date/Time   WBC 7.6 10/02/2015 0615   RBC 2.97 (L)  10/02/2015 0615   HGB 9.0 (L) 10/02/2015 0615   HCT 26.6 (L) 10/02/2015 0615   HCT 31.0 (L) 09/01/2015 1132   PLT 73 (L) 10/02/2015 0615   PLT 142 (L) 09/01/2015 1132   MCV 89.6 10/02/2015 0615   MCV 95 09/01/2015 1132   MCH 30.3 10/02/2015 0615   MCHC 33.8 10/02/2015 0615   RDW 19.1 (H) 10/02/2015 0615   RDW 17.2 (H) 09/01/2015 1132   LYMPHSABS 0.7 09/01/2015 1132   MONOABS 0.9 04/13/2015 0747   EOSABS 0.1 09/01/2015 1132   BASOSABS 0.0 09/01/2015 1132     Assessment/Plan:  1. Acute renal failure on CKD stage 3: Tom Nguyen shows and increase in urine output with higher diuretics dosing over the past 24hrs. He still has not achieved a good negative balance but this trend in the right direction is promising so we will not plan to start CRRT for volume removal at this time. For long term outcomes he would not tolerate intermittent HD well. He needs daily reassessment as his situation is still tentative with maybe some recovery from an ischemic ATN superimposed on his CKD and heart failure. Blood pressures remain soft but adequate over the interval. Some variation in potassium levels makes true value a bit unclear, will have low threshold to replace with high dose diuretics despite currentl renal failure.  2. Combined systolic and diastolic heart failure from ischemic cardiomyopathy: Diuresis and continue ionotropes per Heart Failure team  3. Vtach: S/p defibrillation and later chemical cardioversion with ATP, he remains in a normal rhythm   Fuller Plan, MD PGY-II Internal Medicine Resident Pager# [phone] 10/02/2015, 11:30 AM

## 2015-10-02 NOTE — Progress Notes (Signed)
NIV set up patient states that he will wear It at midnight once he is ready for bed.

## 2015-10-02 NOTE — Progress Notes (Addendum)
Patient ID: Tom Nguyen, male   DOB: 05-Jan-1933, 82 y.o.   MRN: [id number]     SUBJECTIVE: Patient transferred from [facility] 9/13.  He was initially admitted to [facility] after VT with ICD discharge, then developed progressive renal dysfunction with volume overload.  Creatinine up to 3.73.  He was started on dopamine 9/12 but had VT in 140s terminated by ATP.    Now on milrinone at 0.125 mcg/kg/min without VT, co-ox good at 60%. Yesterday, got Lasix 160 mg IV every 8 hrs with 1570 cc urine (improved).  CVP 15.  Creatinine with slight downtrend.  Comfortable at rest.  K high on am labs, nurse concerned sample hemolyzed.    Currently in NSR with BiV pacing.   Scheduled Meds: . allopurinol  100 mg Oral Daily  . arformoterol  15 mcg Nebulization BID   And  . umeclidinium bromide  1 puff Inhalation Daily  . carvedilol  3.125 mg Oral BID WC  . clopidogrel  75 mg Oral Daily  . folic acid  1 mg Oral Daily  . furosemide  160 mg Intravenous Q8H  . heparin  5,000 Units Subcutaneous Q8H  . leflunomide  20 mg Oral Daily  . levothyroxine  75 mcg Oral QAC breakfast  . mouth rinse  15 mL Mouth Rinse BID  . montelukast  10 mg Oral Daily  . pantoprazole  40 mg Oral Daily  . simvastatin  40 mg Oral q1800  . sodium chloride flush  3 mL Intravenous Q12H   Continuous Infusions: . amiodarone 30 mg/hr (10/01/15 2110)  . milrinone 0.125 mcg/kg/min (10/01/15 2000)   PRN Meds:.sodium chloride, acetaminophen, ipratropium-albuterol, nitroGLYCERIN, ondansetron (ZOFRAN) IV, sodium chloride flush    Vitals:   10/02/15 0400 10/02/15 0500 10/02/15 0600 10/02/15 0700  BP: 100/71 (!) 103/47 110/61   Pulse: 71 70 71 70  Resp: 17 19 19 14   Temp:      TempSrc:      SpO2: 96% 93% 96% 90%  Weight:  178 lb 12.7 oz (81.1 kg)    Height:        Intake/Output Summary (Last 24 hours) at 10/02/15 0732 Last data filed at 10/02/15 0700  Gross per 24 hour  Intake           1140.8 ml  Output             1570 ml  Net            -429.2 ml    LABS: Basic Metabolic Panel:  Recent Labs  10/04/15 0459  10/01/15 1741 10/02/15 0310  NA 130*  < > 130* 130*  K 4.4  < > 4.7 6.2*  CL 99*  < > 96* 96*  CO2 20*  < > 22 23  GLUCOSE 122*  < > 112* 94  BUN 65*  < > 67* 69*  CREATININE 3.73*  < > 3.81* 3.71*  CALCIUM 8.3*  < > 8.0* 7.8*  PHOS 4.0  --   --   --   < > = values in this interval not displayed. Liver Function Tests: No results for input(s): AST, ALT, ALKPHOS, BILITOT, PROT, ALBUMIN in the last 72 hours. No results for input(s): LIPASE, AMYLASE in the last 72 hours. CBC:  Recent Labs  10/01/15 0446 10/02/15 0615  WBC 8.8 7.6  HGB 8.8* 9.0*  HCT 25.9* 26.6*  MCV 90.2 89.6  PLT 117* 73*   Cardiac Enzymes: No results for input(s): CKTOTAL, CKMB, CKMBINDEX, TROPONINI in  the last 72 hours. BNP: Invalid input(s): POCBNP D-Dimer: No results for input(s): DDIMER in the last 72 hours. Hemoglobin A1C: No results for input(s): HGBA1C in the last 72 hours. Fasting Lipid Panel: No results for input(s): CHOL, HDL, LDLCALC, TRIG, CHOLHDL, LDLDIRECT in the last 72 hours. Thyroid Function Tests: No results for input(s): TSH, T4TOTAL, T3FREE, THYROIDAB in the last 72 hours.  Invalid input(s): FREET3 Anemia Panel: No results for input(s): VITAMINB12, FOLATE, FERRITIN, TIBC, IRON, RETICCTPCT in the last 72 hours.  RADIOLOGY: US Renal  Result Date: 09/27/2015 CLINICAL DATA:  Acute renal failure. EXAM: RENAL / URINARY TRACT ULTRASOUND COMPLETE COMPARISON:  08/07/2015 FINDINGS: Right Kidney: Length: 9.9 cm. Cortical thinning noted with slight increased cortical echogenicity. No hydronephrosis. Left Kidney: Length: 11.5 cm multiple simple appearing cysts are identified, as before. No hydronephrosis. Echogenicity within normal limits. No mass or hydronephrosis visualized. Bladder: Appears normal for degree of bladder distention. Other:  Small volume ascites noted with 2.8 cm hepatic cyst evident. IMPRESSION: No  evidence for hydronephrosis. Electronically Signed   By: Kennith Center M.D.   On: 09/27/2015 10:30   Dg Chest Port 1 View  Result Date: 09/30/2015 CLINICAL DATA:  Central line placement EXAM: PORTABLE CHEST 1 VIEW COMPARISON:  09/25/2015 FINDINGS: Left pacer remains in place, unchanged. Interval placement of left internal jugular central line. The tip projects superiorly into the lateral wall of the SVC or possibly into the lower right innominate vein. No pneumothorax. There is cardiomegaly with vascular congestion. Bibasilar atelectasis and small effusions. IMPRESSION: Left PICC line tip projects superiorly into the lateral wall of the SVC or possibly into the lower portion of the right innominate vein. Cardiomegaly with vascular congestion. Small effusions with bibasilar atelectasis. Electronically Signed   By: Charlett Nose M.D.   On: 09/30/2015 14:10   Dg Chest Port 1 View  Result Date: 09/25/2015 CLINICAL DATA:  82 year old male with shortness of breath. EXAM: PORTABLE CHEST 1 VIEW COMPARISON:  Chest radiograph dated 09/24/2015 FINDINGS: There is stable moderate cardiomegaly with minimal central vascular prominence. There is no focal consolidation, pleural effusion, or pneumothorax. Right apical pleural thickening. Left pectoral AICD device and median sternotomy wires noted. There is osteopenia with degenerative changes of the spine. Bilateral shoulder arthroplasty. No acute fracture. IMPRESSION: Cardiomegaly with probable mild congestive changes. No focal consolidation or edema. Electronically Signed   By: Elgie Collard M.D.   On: 09/25/2015 02:59   Dg Chest Port 1 View  Result Date: 09/24/2015 CLINICAL DATA:  Dyspnea EXAM: PORTABLE CHEST 1 VIEW COMPARISON:  08/12/2015 FINDINGS: Cardiomegaly again noted. Status post median sternotomy. 3 leads cardiac pacemaker is unchanged in position. No infiltrate or pulmonary edema. Bilateral shoulder prosthesis again noted. IMPRESSION: Cardiomegaly. No active  disease. Cardiac pacemaker unchanged in position. Electronically Signed   By: Natasha Mead M.D.   On: 09/24/2015 20:24    PHYSICAL EXAM General: NAD Neck: JVP 14 cm, no thyromegaly or thyroid nodule.  Lungs: Crackles at bases bilaterally. CV: Lateral PMI.  Heart sounds distant, regular S1/S2, no S3/S4, no murmur.  1+ ankle edema.   Abdomen: Soft, nontender, no hepatosplenomegaly, no distention.  Neurologic: Alert and oriented x 3.  Psych: Normal affect. Extremities: No clubbing or cyanosis.   TELEMETRY: Reviewed telemetry pt in NSR with BiV pacing:  ASSESSMENT AND PLAN: 82 with history of CAD s/p CABG, ischemic cardiomyopathy s/p [employer] CRT-D, paroxysmal atrial fibrillation, CKD admitted with VT and ICD discharge.  At [facility], he developed progressive volume overload and  renal dysfunction, had VT again with dopamine use.  Transferred to [facility].  1. VT: Appropriate ICD discharge prior to admission.  Had another VT episode in hospital on dopamine, terminated with ATP. Do not think this was triggered by ACS, likely scar-mediated. No chest pain.  - Continue amiodarone gtt while on low dose milrinone.  2. CAD: s/p CABG.  Had LHC in 7/17 with no options for revascularization.  Would not repeat cath (especially with AKI). Elevated troponin at admission may be demand ischemia with volume overload and defibrillation.  - Continue Plavix, statin.  3. Acute on chronic systolic CHF: EF 37-85%, moderate MR, moderately decreased RV systolic function, moderate TR on 7/17 echo, ischemic cardiomyopathy.  [employer] CRT-D.  RHC in 7/17 showed low cardiac output and elevated filling pressures (PA 55/31, mean PCWP 32, CI 1.6).  Currently, he is volume overloaded on exam but creatinine is now significantly elevated.  SBP in 90s-100s.  Co-ox not particularly low, now on low-dose milrinone at 0.125 with co-ox 60%.  CVP now 15.  Difficult situation => suspect possible ATN event from cardiac arrest with  hypotension at time of initial admission.   - Continue low dose milrinone for now.  - Continue Lasix 160 mg IV every 8 hrs for now.    - In absence of renal recovery, prognosis poor.  4. AKI on CKD stage III: Most likely hemodynamic => suspect ATN event in the setting of initial cardiac arrest with hypotension. Renal following, not a good long-term HD candidate.  However, could envision short course of CVVH to see if he recovers renal function. Right now, UOP picking up and creatinine maybe starting to trend down.  5. Atrial fibrillation: History of PAF, has not been anticoagulated however.  6. Out of bed, PT consult.   Marca Ancona 10/02/2015 7:32 AM

## 2015-10-03 LAB — CBC
HEMATOCRIT: 25 % — AB (ref 39.0–52.0)
Hemoglobin: 8.4 g/dL — ABNORMAL LOW (ref 13.0–17.0)
MCH: 29.6 pg (ref 26.0–34.0)
MCHC: 33.6 g/dL (ref 30.0–36.0)
MCV: 88 fL (ref 78.0–100.0)
PLATELETS: 107 10*3/uL — AB (ref 150–400)
RBC: 2.84 MIL/uL — AB (ref 4.22–5.81)
RDW: 18.2 % — AB (ref 11.5–15.5)
WBC: 5.5 10*3/uL (ref 4.0–10.5)

## 2015-10-03 LAB — CARBOXYHEMOGLOBIN
CARBOXYHEMOGLOBIN: 1.6 % — AB (ref 0.5–1.5)
METHEMOGLOBIN: 0.8 % (ref 0.0–1.5)
O2 SAT: 62.4 %
Total hemoglobin: 13.2 g/dL (ref 12.0–16.0)

## 2015-10-03 LAB — BASIC METABOLIC PANEL
ANION GAP: 15 (ref 5–15)
Anion gap: 13 (ref 5–15)
BUN: 74 mg/dL — ABNORMAL HIGH (ref 6–20)
BUN: 74 mg/dL — ABNORMAL HIGH (ref 6–20)
CALCIUM: 8 mg/dL — AB (ref 8.9–10.3)
CHLORIDE: 96 mmol/L — AB (ref 101–111)
CO2: 24 mmol/L (ref 22–32)
CO2: 24 mmol/L (ref 22–32)
CREATININE: 3.99 mg/dL — AB (ref 0.61–1.24)
CREATININE: 4.12 mg/dL — AB (ref 0.61–1.24)
Calcium: 8.1 mg/dL — ABNORMAL LOW (ref 8.9–10.3)
Chloride: 96 mmol/L — ABNORMAL LOW (ref 101–111)
GFR calc non Af Amer: 13 mL/min — ABNORMAL LOW (ref >60)
GFR, EST AFRICAN AMERICAN: 15 mL/min — AB (ref >60)
GFR, EST AFRICAN AMERICAN: 14 mL/min — AB (ref >60)
GFR, EST NON AFRICAN AMERICAN: 12 mL/min — AB (ref >60)
GLUCOSE: 152 mg/dL — AB (ref 65–99)
Glucose, Bld: 85 mg/dL (ref 65–99)
POTASSIUM: 3.1 mmol/L — AB (ref 3.5–5.1)
Potassium: 2.9 mmol/L — ABNORMAL LOW (ref 3.5–5.1)
SODIUM: 135 mmol/L (ref 135–145)
Sodium: 133 mmol/L — ABNORMAL LOW (ref 135–145)

## 2015-10-03 LAB — MAGNESIUM: MAGNESIUM: 1.7 mg/dL (ref 1.7–2.4)

## 2015-10-03 MED ORDER — POTASSIUM CHLORIDE 10 MEQ/50ML IV SOLN: 10.0000 meq | INTRAVENOUS | Status: DC

## 2015-10-03 MED ORDER — POTASSIUM CHLORIDE 10 MEQ/50ML IV SOLN
10.0000 meq | INTRAVENOUS | Status: AC
  Administered 2015-10-03 (×4): 10 meq via INTRAVENOUS
  Filled 2015-10-03 (×4): qty 50

## 2015-10-03 MED ORDER — POTASSIUM CHLORIDE CRYS ER 20 MEQ PO TBCR: 40.0000 meq | EXTENDED_RELEASE_TABLET | Freq: Once | ORAL | Status: DC

## 2015-10-03 MED ORDER — BENZOCAINE 10 % MT GEL
Freq: Two times a day (BID) | OROMUCOSAL | Status: DC | PRN
  Administered 2015-10-03: 1 via OROMUCOSAL
  Filled 2015-10-03: qty 9.4

## 2015-10-03 MED ORDER — POTASSIUM CHLORIDE CRYS ER 20 MEQ PO TBCR
40.0000 meq | EXTENDED_RELEASE_TABLET | Freq: Two times a day (BID) | ORAL | Status: AC
Start: 2015-10-03 — End: 2015-10-04
  Administered 2015-10-03 – 2015-10-04 (×2): 40 meq via ORAL
  Filled 2015-10-03 (×2): qty 2

## 2015-10-03 NOTE — Progress Notes (Signed)
Patient ID: Tom Nguyen, male   DOB: 1932-07-21, 82 y.o.   MRN: [id number]   [facility] Progress Note   Assessment/ Plan:   1.Acute renal failure on chronic kidney disease stage III: Suspected to have dual injury from ischemic ATN with V. tach/relative hypotension and CHF exacerbation with renal hypoperfusion. Responding to diuretics at this time and without any acute indications to start CRRT. We'll continue close and vigilant monitoring-he does not desire and is not a candidate for long-term hemodialysis. 2. Combined systolic/diastolic heart failure with ischemic cardiomyopathy: On milrinone and amiodarone with ongoing diuretic therapy-improvement of urine output noted overnight with net -1.5 L 3. Status post ventricular tachycardia: Status post defibrillation and then chemical cardioversion-remains in sinus rhythm at this time on him amiodarone. 4. Hypokalemia: Secondary to ongoing urinary/diuretic associated losses-ongoing oral repletion and will recheck labs again this evening to decide on supplementation.  Subjective:   Reports to be comfortable as long as he remains still-gets short of breath with activity    Objective:   BP (!) 109/56 (BP Location: Left Arm)   Pulse 78   Temp 98.1 F (36.7 C) (Oral)   Resp 15   Ht 5\' 8"  (1.727 m)   Wt 80.6 kg (177 lb 11.1 oz)   SpO2 93%   BMI 27.02 kg/m   Intake/Output Summary (Last 24 hours) at 10/03/15 0842 Last data filed at 10/03/15 0825  Gross per 24 hour  Intake            845.4 ml  Output             2400 ml  Net          -1554.6 ml   Weight change: -0.5 kg (-1 lb 1.6 oz)  Physical Exam: 10/05/15 resting in bed, watching television. Wife and daughter at bedside CVS: Pulse regular rhythm, S1 and S2 normal Resp: Diminished breath sounds over bases with fine rales left side Abd: Soft, obese, nontender Ext: 1+ pitting upper and lower extremity edema  Imaging: No results found.  Labs: BMET  Recent  Labs Lab 09/27/15 0702 09/28/15 0543 09/29/15 0459 09/30/15 0459 10/01/15 0446 10/01/15 1741 10/02/15 0310 10/02/15 0730 10/03/15 0445  NA 131* 133* 133* 130* 131* 130* 130* 132* 135  K 4.4 4.2 4.0 4.4 4.7 4.7 6.2* 3.2* 3.1*  CL 99* 99* 101 99* 99* 96* 96* 98* 96*  CO2 19* 22 22 20* 21* 22 23 22 24   GLUCOSE 91 93 96 122* 102* 112* 94 89 85  BUN 48* 52* 58* 65* 63* 67* 69* 69* 74*  CREATININE 3.16* 3.52* 3.54* 3.73* 3.62* 3.81* 3.71* 3.91* 3.99*  CALCIUM 8.1* 8.2* 8.3* 8.3* 7.6* 8.0* 7.8* 7.9* 8.1*  PHOS 4.1 4.3 4.1 4.0  --   --   --   --   --    CBC  Recent Labs Lab 10/01/15 0446 10/02/15 0615 10/03/15 0445  WBC 8.8 7.6 5.5  HGB 8.8* 9.0* 8.4*  HCT 25.9* 26.6* 25.0*  MCV 90.2 89.6 88.0  PLT 117* 73* 107*    Medications:    . allopurinol  100 mg Oral Daily  . arformoterol  15 mcg Nebulization BID   And  . umeclidinium bromide  1 puff Inhalation Daily  . carvedilol  3.125 mg Oral BID WC  . clopidogrel  75 mg Oral Daily  . folic acid  1 mg Oral Daily  . furosemide  160 mg Intravenous Q8H  . heparin  5,000 Units Subcutaneous Q8H  .  leflunomide  20 mg Oral Daily  . levothyroxine  75 mcg Oral QAC breakfast  . mouth rinse  15 mL Mouth Rinse BID  . montelukast  10 mg Oral Daily  . pantoprazole  40 mg Oral Daily  . potassium chloride  40 mEq Oral BID  . simvastatin  40 mg Oral q1800  . sodium chloride flush  10-40 mL Intracatheter Q12H  . sodium chloride flush  3 mL Intravenous Q12H  Zetta Bills, MD 10/03/2015, 8:42 AM

## 2015-10-03 NOTE — Progress Notes (Signed)
Patient ID: Tom Nguyen, male   DOB: 05/30/1932, 82 y.o.   MRN: [id number]     SUBJECTIVE: Patient transferred from [facility] 9/13.  He was initially admitted to [facility] after VT with ICD discharge, then developed progressive renal dysfunction with volume overload.  Creatinine up to 3.73.  He was started on dopamine 9/12 but had VT in 140s terminated by ATP.    Now on milrinone at 0.125 mcg/kg/min without VT, co-ox good at 62%. Yesterday, got Lasix 160 mg IV every 8 hrs again with improving UOP.  CVP 12-13.  Creatinine fairly stable.  Comfortable at rest.    Currently in NSR with BiV pacing.   Scheduled Meds: . allopurinol  100 mg Oral Daily  . arformoterol  15 mcg Nebulization BID   And  . umeclidinium bromide  1 puff Inhalation Daily  . carvedilol  3.125 mg Oral BID WC  . clopidogrel  75 mg Oral Daily  . folic acid  1 mg Oral Daily  . furosemide  160 mg Intravenous Q8H  . heparin  5,000 Units Subcutaneous Q8H  . leflunomide  20 mg Oral Daily  . levothyroxine  75 mcg Oral QAC breakfast  . mouth rinse  15 mL Mouth Rinse BID  . montelukast  10 mg Oral Daily  . pantoprazole  40 mg Oral Daily  . simvastatin  40 mg Oral q1800  . sodium chloride flush  10-40 mL Intracatheter Q12H  . sodium chloride flush  3 mL Intravenous Q12H   Continuous Infusions: . amiodarone 30 mg/hr (10/03/15 0449)  . milrinone 0.125 mcg/kg/min (10/03/15 0449)   PRN Meds:.sodium chloride, acetaminophen, ipratropium-albuterol, nitroGLYCERIN, ondansetron (ZOFRAN) IV, sodium chloride flush, sodium chloride flush    Vitals:   10/03/15 0458 10/03/15 0500 10/03/15 0600 10/03/15 0825  BP:  108/68 (!) 116/58 (!) 109/56  Pulse:  70 73 78  Resp:  (!) 24 15   Temp:      TempSrc:      SpO2:  92% 96% 93%  Weight: 177 lb 11.1 oz (80.6 kg)     Height:        Intake/Output Summary (Last 24 hours) at 10/03/15 0834 Last data filed at 10/03/15 0825  Gross per 24 hour  Intake            845.4 ml  Output             2400 ml    Net          -1554.6 ml    LABS: Basic Metabolic Panel:  Recent Labs  46/50/35 0730 10/02/15 1130 10/03/15 0445  NA 132*  --  135  K 3.2*  --  3.1*  CL 98*  --  96*  CO2 22  --  24  GLUCOSE 89  --  85  BUN 69*  --  74*  CREATININE 3.91*  --  3.99*  CALCIUM 7.9*  --  8.1*  MG  --  1.7 1.7   Liver Function Tests: No results for input(s): AST, ALT, ALKPHOS, BILITOT, PROT, ALBUMIN in the last 72 hours. No results for input(s): LIPASE, AMYLASE in the last 72 hours. CBC:  Recent Labs  10/02/15 0615 10/03/15 0445  WBC 7.6 5.5  HGB 9.0* 8.4*  HCT 26.6* 25.0*  MCV 89.6 88.0  PLT 73* 107*   Cardiac Enzymes: No results for input(s): CKTOTAL, CKMB, CKMBINDEX, TROPONINI in the last 72 hours. BNP: Invalid input(s): POCBNP D-Dimer: No results for input(s): DDIMER in the last 72  hours. Hemoglobin A1C: No results for input(s): HGBA1C in the last 72 hours. Fasting Lipid Panel: No results for input(s): CHOL, HDL, LDLCALC, TRIG, CHOLHDL, LDLDIRECT in the last 72 hours. Thyroid Function Tests: No results for input(s): TSH, T4TOTAL, T3FREE, THYROIDAB in the last 72 hours.  Invalid input(s): FREET3 Anemia Panel: No results for input(s): VITAMINB12, FOLATE, FERRITIN, TIBC, IRON, RETICCTPCT in the last 72 hours.  RADIOLOGY: US Renal  Result Date: 09/27/2015 CLINICAL DATA:  Acute renal failure. EXAM: RENAL / URINARY TRACT ULTRASOUND COMPLETE COMPARISON:  08/07/2015 FINDINGS: Right Kidney: Length: 9.9 cm. Cortical thinning noted with slight increased cortical echogenicity. No hydronephrosis. Left Kidney: Length: 11.5 cm multiple simple appearing cysts are identified, as before. No hydronephrosis. Echogenicity within normal limits. No mass or hydronephrosis visualized. Bladder: Appears normal for degree of bladder distention. Other:  Small volume ascites noted with 2.8 cm hepatic cyst evident. IMPRESSION: No evidence for hydronephrosis. Electronically Signed   By: Kennith Center M.D.    On: 09/27/2015 10:30   Dg Chest Port 1 View  Result Date: 09/30/2015 CLINICAL DATA:  Central line placement EXAM: PORTABLE CHEST 1 VIEW COMPARISON:  09/25/2015 FINDINGS: Left pacer remains in place, unchanged. Interval placement of left internal jugular central line. The tip projects superiorly into the lateral wall of the SVC or possibly into the lower right innominate vein. No pneumothorax. There is cardiomegaly with vascular congestion. Bibasilar atelectasis and small effusions. IMPRESSION: Left PICC line tip projects superiorly into the lateral wall of the SVC or possibly into the lower portion of the right innominate vein. Cardiomegaly with vascular congestion. Small effusions with bibasilar atelectasis. Electronically Signed   By: Charlett Nose M.D.   On: 09/30/2015 14:10   Dg Chest Port 1 View  Result Date: 09/25/2015 CLINICAL DATA:  82 year old male with shortness of breath. EXAM: PORTABLE CHEST 1 VIEW COMPARISON:  Chest radiograph dated 09/24/2015 FINDINGS: There is stable moderate cardiomegaly with minimal central vascular prominence. There is no focal consolidation, pleural effusion, or pneumothorax. Right apical pleural thickening. Left pectoral AICD device and median sternotomy wires noted. There is osteopenia with degenerative changes of the spine. Bilateral shoulder arthroplasty. No acute fracture. IMPRESSION: Cardiomegaly with probable mild congestive changes. No focal consolidation or edema. Electronically Signed   By: Elgie Collard M.D.   On: 09/25/2015 02:59   Dg Chest Port 1 View  Result Date: 09/24/2015 CLINICAL DATA:  Dyspnea EXAM: PORTABLE CHEST 1 VIEW COMPARISON:  08/12/2015 FINDINGS: Cardiomegaly again noted. Status post median sternotomy. 3 leads cardiac pacemaker is unchanged in position. No infiltrate or pulmonary edema. Bilateral shoulder prosthesis again noted. IMPRESSION: Cardiomegaly. No active disease. Cardiac pacemaker unchanged in position. Electronically Signed   By:  Natasha Mead M.D.   On: 09/24/2015 20:24    PHYSICAL EXAM General: NAD Neck: JVP 14 cm, no thyromegaly or thyroid nodule.  Lungs: Crackles at bases bilaterally. CV: Lateral PMI.  Heart sounds distant, regular S1/S2, no S3/S4, no murmur.  1+ ankle edema.   Abdomen: Soft, nontender, no hepatosplenomegaly, no distention.  Neurologic: Alert and oriented x 3.  Psych: Normal affect. Extremities: No clubbing or cyanosis.   TELEMETRY: Reviewed telemetry pt in NSR with BiV pacing:  ASSESSMENT AND PLAN: 82 with history of CAD s/p CABG, ischemic cardiomyopathy s/p [employer] CRT-D, paroxysmal atrial fibrillation, CKD admitted with VT and ICD discharge.  At [facility], he developed progressive volume overload and renal dysfunction, had VT again with dopamine use.  Transferred to [facility].  1. VT: Appropriate ICD discharge  prior to admission.  Had another VT episode in hospital on dopamine, terminated with ATP. Do not think this was triggered by ACS, likely scar-mediated. No chest pain.  - Continue amiodarone gtt while on low dose milrinone.  2. CAD: s/p CABG.  Had LHC in 7/17 with no options for revascularization.  Would not repeat cath (especially with AKI). Elevated troponin at admission may be demand ischemia with volume overload and defibrillation.  - Continue Plavix, statin.  3. Acute on chronic systolic CHF: EF 35-32%, moderate MR, moderately decreased RV systolic function, moderate TR on 7/17 echo, ischemic cardiomyopathy.  [employer] CRT-D.  RHC in 7/17 showed low cardiac output and elevated filling pressures (PA 55/31, mean PCWP 32, CI 1.6).  Currently, he is volume overloaded on exam but creatinine is now significantly elevated.  SBP in 90s-100s, stable.  Co-ox not particularly low, now on low-dose milrinone at 0.125 with co-ox 62%.  CVP now 12-13.  Suspect ATN event from cardiac arrest with hypotension at time of initial admission.  UOP continues to improve but creatinine stably elevated.  -  Continue low dose milrinone for now.  - Continue Lasix 160 mg IV every 8 hrs for now.    - In absence of renal recovery, prognosis poor.  4. AKI on CKD stage III: Most likely hemodynamic => suspect ATN event in the setting of initial cardiac arrest with hypotension. Renal following, not a good long-term HD candidate.  However, could envision short course of CVVH if needed to see if he recovers renal function. Right now, UOP picking up but creatinine remains plateaued.  5. Atrial fibrillation: History of PAF, has not been anticoagulated however.  6. Out of bed, PT consult.   Marca Ancona 10/03/2015 8:34 AM

## 2015-10-04 LAB — BASIC METABOLIC PANEL
Anion gap: 15 (ref 5–15)
BUN: 75 mg/dL — ABNORMAL HIGH (ref 6–20)
CALCIUM: 8 mg/dL — AB (ref 8.9–10.3)
CHLORIDE: 94 mmol/L — AB (ref 101–111)
CO2: 25 mmol/L (ref 22–32)
CREATININE: 4.02 mg/dL — AB (ref 0.61–1.24)
GFR calc non Af Amer: 13 mL/min — ABNORMAL LOW (ref >60)
GFR, EST AFRICAN AMERICAN: 15 mL/min — AB (ref >60)
Glucose, Bld: 101 mg/dL — ABNORMAL HIGH (ref 65–99)
Potassium: 3.3 mmol/L — ABNORMAL LOW (ref 3.5–5.1)
SODIUM: 134 mmol/L — AB (ref 135–145)

## 2015-10-04 LAB — CARBOXYHEMOGLOBIN
CARBOXYHEMOGLOBIN: 1.4 % (ref 0.5–1.5)
METHEMOGLOBIN: 1.1 % (ref 0.0–1.5)
O2 Saturation: 75.9 %
TOTAL HEMOGLOBIN: 8.8 g/dL — AB (ref 12.0–16.0)

## 2015-10-04 LAB — CBC
HCT: 25.3 % — ABNORMAL LOW (ref 39.0–52.0)
Hemoglobin: 8.7 g/dL — ABNORMAL LOW (ref 13.0–17.0)
MCH: 29.9 pg (ref 26.0–34.0)
MCHC: 34.4 g/dL (ref 30.0–36.0)
MCV: 86.9 fL (ref 78.0–100.0)
PLATELETS: 109 10*3/uL — AB (ref 150–400)
RBC: 2.91 MIL/uL — AB (ref 4.22–5.81)
RDW: 18.2 % — ABNORMAL HIGH (ref 11.5–15.5)
WBC: 6.1 10*3/uL (ref 4.0–10.5)

## 2015-10-04 LAB — GLUCOSE, CAPILLARY: Glucose-Capillary: 98 mg/dL (ref 65–99)

## 2015-10-04 LAB — POTASSIUM: Potassium: 3.4 mmol/L — ABNORMAL LOW (ref 3.5–5.1)

## 2015-10-04 MED ORDER — SODIUM CHLORIDE 0.9 % IV SOLN
250.0000 mL | INTRAVENOUS | Status: DC | PRN
  Administered 2015-10-09: 250 mL via INTRAVENOUS

## 2015-10-04 MED ORDER — POTASSIUM CHLORIDE CRYS ER 20 MEQ PO TBCR
40.0000 meq | EXTENDED_RELEASE_TABLET | Freq: Two times a day (BID) | ORAL | Status: AC
  Administered 2015-10-04 (×2): 40 meq via ORAL
  Filled 2015-10-04 (×2): qty 2

## 2015-10-04 NOTE — Progress Notes (Signed)
Patient ID: Tom Nguyen, male   DOB: 1932/04/04, 82 y.o.   MRN: [id number]  [facility] Progress Note   Assessment/ Plan:   1.Acute renal failure on chronic kidney disease stage III: Suspected dual injury from ischemic ATN from V. tach/relative hypotension and CHF exacerbation with renal hypoperfusion. Continues to show slow but consistent response to diuretics at this time and without any acute indications to start CRRT. We'll continue close and vigilant monitoring-he does not desire and is not a candidate for long-term hemodialysis. 2. Combined systolic/diastolic heart failure with ischemic cardiomyopathy: On milrinone and amiodarone with ongoing diuretic therapy-improvement of urine output noted overnight with net -0.5 L. Clinically, appears to be doing better with regards to shortness of breath for reports. 3. Status post ventricular tachycardia: Status post defibrillation and then chemical cardioversion-remains in sinus rhythm at this time on him amiodarone. 4. Hypokalemia: Secondary to ongoing urinary/diuretic associated losses-ongoing oral supplementation.  Subjective:   Reports to be feeling somewhat better-ambulated around unit yesterday    Objective:   BP (!) 99/55   Pulse 70   Temp 97.6 F (36.4 C)   Resp 16   Ht 5\' 8"  (1.727 m)   Wt 80.6 kg (177 lb 11.1 oz)   SpO2 94%   BMI 27.02 kg/m   Intake/Output Summary (Last 24 hours) at 10/04/15 0905 Last data filed at 10/04/15 0700  Gross per 24 hour  Intake           1450.4 ml  Output             1975 ml  Net           -524.6 ml   Weight change:   Physical Exam: 10/06/15 sitting in recliner. Wife and daughter at bedside CVS: Pulse regular rhythm, S1 and S2 normal Resp: Diminished breath sounds over bases with fine rales left side Abd: Soft, obese, nontender Ext: 1+ pitting upper and lower extremity edema  Imaging: No results found.  Labs: BMET  Recent Labs Lab 09/28/15 0543 09/29/15 0459  09/30/15 0459 10/01/15 0446 10/01/15 1741 10/02/15 0310 10/02/15 0730 10/03/15 0445 10/03/15 1646 10/04/15 0030 10/04/15 0530  NA 133* 133* 130* 131* 130* 130* 132* 135 133*  --  134*  K 4.2 4.0 4.4 4.7 4.7 6.2* 3.2* 3.1* 2.9* 3.4* 3.3*  CL 99* 101 99* 99* 96* 96* 98* 96* 96*  --  94*  CO2 22 22 20* 21* 22 23 22 24 24   --  25  GLUCOSE 93 96 122* 102* 112* 94 89 85 152*  --  101*  BUN 52* 58* 65* 63* 67* 69* 69* 74* 74*  --  75*  CREATININE 3.52* 3.54* 3.73* 3.62* 3.81* 3.71* 3.91* 3.99* 4.12*  --  4.02*  CALCIUM 8.2* 8.3* 8.3* 7.6* 8.0* 7.8* 7.9* 8.1* 8.0*  --  8.0*  PHOS 4.3 4.1 4.0  --   --   --   --   --   --   --   --    CBC  Recent Labs Lab 10/01/15 0446 10/02/15 0615 10/03/15 0445 10/04/15 0530  WBC 8.8 7.6 5.5 6.1  HGB 8.8* 9.0* 8.4* 8.7*  HCT 25.9* 26.6* 25.0* 25.3*  MCV 90.2 89.6 88.0 86.9  PLT 117* 73* 107* 109*    Medications:    . allopurinol  100 mg Oral Daily  . arformoterol  15 mcg Nebulization BID   And  . umeclidinium bromide  1 puff Inhalation Daily  . carvedilol  3.125  mg Oral BID WC  . clopidogrel  75 mg Oral Daily  . folic acid  1 mg Oral Daily  . furosemide  160 mg Intravenous Q8H  . heparin  5,000 Units Subcutaneous Q8H  . leflunomide  20 mg Oral Daily  . levothyroxine  75 mcg Oral QAC breakfast  . mouth rinse  15 mL Mouth Rinse BID  . montelukast  10 mg Oral Daily  . pantoprazole  40 mg Oral Daily  . potassium chloride  40 mEq Oral BID  . simvastatin  40 mg Oral q1800  . sodium chloride flush  10-40 mL Intracatheter Q12H  . sodium chloride flush  3 mL Intravenous Q12H  Zetta Bills, MD 10/04/2015, 9:05 AM

## 2015-10-04 NOTE — Progress Notes (Signed)
Patient ID: Tom Nguyen, male   DOB: 11-Mar-1932, 82 y.o.   MRN: [id number]      SUBJECTIVE: Patient transferred from [facility] 9/13.  He was initially admitted to [facility] after VT with ICD discharge, then developed progressive renal dysfunction with volume overload.  Creatinine up to 3.73.  He was started on dopamine 9/12 but had VT in 140s terminated by ATP.    Now on milrinone at 0.125 mcg/kg/min without VT, co-ox good at 76%. Yesterday, got Lasix 160 mg IV every 8 hrs again with steady improvement in UOP over the last couple of days.  However, BUN/creatinine remain stably elevated.  CVP 10-12.  Comfortable at rest.    Currently in NSR with BiV pacing.   Scheduled Meds: . allopurinol  100 mg Oral Daily  . arformoterol  15 mcg Nebulization BID   And  . umeclidinium bromide  1 puff Inhalation Daily  . carvedilol  3.125 mg Oral BID WC  . clopidogrel  75 mg Oral Daily  . folic acid  1 mg Oral Daily  . furosemide  160 mg Intravenous Q8H  . heparin  5,000 Units Subcutaneous Q8H  . leflunomide  20 mg Oral Daily  . levothyroxine  75 mcg Oral QAC breakfast  . mouth rinse  15 mL Mouth Rinse BID  . montelukast  10 mg Oral Daily  . pantoprazole  40 mg Oral Daily  . potassium chloride  40 mEq Oral BID  . simvastatin  40 mg Oral q1800  . sodium chloride flush  10-40 mL Intracatheter Q12H  . sodium chloride flush  3 mL Intravenous Q12H   Continuous Infusions: . amiodarone 30 mg/hr (10/04/15 0354)  . milrinone 0.125 mcg/kg/min (10/03/15 0449)   PRN Meds:.sodium chloride, acetaminophen, benzocaine, ipratropium-albuterol, nitroGLYCERIN, ondansetron (ZOFRAN) IV, sodium chloride flush, sodium chloride flush    Vitals:   10/04/15 0400 10/04/15 0500 10/04/15 0600 10/04/15 0700  BP: (!) 108/53 (!) 111/51 (!) 106/44 (!) 107/59  Pulse: 69 69 76 70  Resp: 13 14 (!) 25 16  Temp:      TempSrc:      SpO2: 98% 96% 95% 94%  Weight:      Height:        Intake/Output Summary (Last 24 hours) at 10/04/15  0810 Last data filed at 10/04/15 0700  Gross per 24 hour  Intake           1710.1 ml  Output             2375 ml  Net           -664.9 ml    LABS: Basic Metabolic Panel:  Recent Labs  83/72/90 1130 10/03/15 0445 10/03/15 1646 10/04/15 0030 10/04/15 0530  NA  --  135 133*  --  134*  K  --  3.1* 2.9* 3.4* 3.3*  CL  --  96* 96*  --  94*  CO2  --  24 24  --  25  GLUCOSE  --  85 152*  --  101*  BUN  --  74* 74*  --  75*  CREATININE  --  3.99* 4.12*  --  4.02*  CALCIUM  --  8.1* 8.0*  --  8.0*  MG 1.7 1.7  --   --   --    Liver Function Tests: No results for input(s): AST, ALT, ALKPHOS, BILITOT, PROT, ALBUMIN in the last 72 hours. No results for input(s): LIPASE, AMYLASE in the last 72 hours. CBC:  Recent  Labs  10/03/15 0445 10/04/15 0530  WBC 5.5 6.1  HGB 8.4* 8.7*  HCT 25.0* 25.3*  MCV 88.0 86.9  PLT 107* 109*   Cardiac Enzymes: No results for input(s): CKTOTAL, CKMB, CKMBINDEX, TROPONINI in the last 72 hours. BNP: Invalid input(s): POCBNP D-Dimer: No results for input(s): DDIMER in the last 72 hours. Hemoglobin A1C: No results for input(s): HGBA1C in the last 72 hours. Fasting Lipid Panel: No results for input(s): CHOL, HDL, LDLCALC, TRIG, CHOLHDL, LDLDIRECT in the last 72 hours. Thyroid Function Tests: No results for input(s): TSH, T4TOTAL, T3FREE, THYROIDAB in the last 72 hours.  Invalid input(s): FREET3 Anemia Panel: No results for input(s): VITAMINB12, FOLATE, FERRITIN, TIBC, IRON, RETICCTPCT in the last 72 hours.  RADIOLOGY: US Renal  Result Date: 09/27/2015 CLINICAL DATA:  Acute renal failure. EXAM: RENAL / URINARY TRACT ULTRASOUND COMPLETE COMPARISON:  08/07/2015 FINDINGS: Right Kidney: Length: 9.9 cm. Cortical thinning noted with slight increased cortical echogenicity. No hydronephrosis. Left Kidney: Length: 11.5 cm multiple simple appearing cysts are identified, as before. No hydronephrosis. Echogenicity within normal limits. No mass or  hydronephrosis visualized. Bladder: Appears normal for degree of bladder distention. Other:  Small volume ascites noted with 2.8 cm hepatic cyst evident. IMPRESSION: No evidence for hydronephrosis. Electronically Signed   By: Kennith Center M.D.   On: 09/27/2015 10:30   Dg Chest Port 1 View  Result Date: 09/30/2015 CLINICAL DATA:  Central line placement EXAM: PORTABLE CHEST 1 VIEW COMPARISON:  09/25/2015 FINDINGS: Left pacer remains in place, unchanged. Interval placement of left internal jugular central line. The tip projects superiorly into the lateral wall of the SVC or possibly into the lower right innominate vein. No pneumothorax. There is cardiomegaly with vascular congestion. Bibasilar atelectasis and small effusions. IMPRESSION: Left PICC line tip projects superiorly into the lateral wall of the SVC or possibly into the lower portion of the right innominate vein. Cardiomegaly with vascular congestion. Small effusions with bibasilar atelectasis. Electronically Signed   By: Charlett Nose M.D.   On: 09/30/2015 14:10   Dg Chest Port 1 View  Result Date: 09/25/2015 CLINICAL DATA:  82 year old male with shortness of breath. EXAM: PORTABLE CHEST 1 VIEW COMPARISON:  Chest radiograph dated 09/24/2015 FINDINGS: There is stable moderate cardiomegaly with minimal central vascular prominence. There is no focal consolidation, pleural effusion, or pneumothorax. Right apical pleural thickening. Left pectoral AICD device and median sternotomy wires noted. There is osteopenia with degenerative changes of the spine. Bilateral shoulder arthroplasty. No acute fracture. IMPRESSION: Cardiomegaly with probable mild congestive changes. No focal consolidation or edema. Electronically Signed   By: Elgie Collard M.D.   On: 09/25/2015 02:59   Dg Chest Port 1 View  Result Date: 09/24/2015 CLINICAL DATA:  Dyspnea EXAM: PORTABLE CHEST 1 VIEW COMPARISON:  08/12/2015 FINDINGS: Cardiomegaly again noted. Status post median  sternotomy. 3 leads cardiac pacemaker is unchanged in position. No infiltrate or pulmonary edema. Bilateral shoulder prosthesis again noted. IMPRESSION: Cardiomegaly. No active disease. Cardiac pacemaker unchanged in position. Electronically Signed   By: Natasha Mead M.D.   On: 09/24/2015 20:24    PHYSICAL EXAM General: NAD Neck: JVP 14 cm, no thyromegaly or thyroid nodule.  Lungs: Crackles at bases bilaterally. CV: Lateral PMI.  Heart sounds distant, regular S1/S2, no S3/S4, no murmur.  1+edema 1/2 to knees bilaterally.   Abdomen: Soft, nontender, no hepatosplenomegaly, no distention.  Neurologic: Alert and oriented x 3.  Psych: Normal affect. Extremities: No clubbing or cyanosis.   TELEMETRY: Reviewed telemetry pt  in NSR with BiV pacing:  ASSESSMENT AND PLAN: 82 with history of CAD s/p CABG, ischemic cardiomyopathy s/p [employer] CRT-D, paroxysmal atrial fibrillation, CKD admitted with VT and ICD discharge.  At [facility], he developed progressive volume overload and renal dysfunction, had VT again with dopamine use.  Transferred to [facility].  1. VT: Appropriate ICD discharge prior to admission.  Had another VT episode in hospital on dopamine, terminated with ATP. Do not think this was triggered by ACS, likely scar-mediated. No chest pain.  - Continue amiodarone gtt while on low dose milrinone.  2. CAD: s/p CABG.  Had LHC in 7/17 with no options for revascularization.  Would not repeat cath (especially with AKI). Elevated troponin at admission may be demand ischemia with volume overload and defibrillation.  - Continue Plavix, statin.  3. Acute on chronic systolic CHF: EF 27-03%, moderate MR, moderately decreased RV systolic function, moderate TR on 7/17 echo, ischemic cardiomyopathy.  [employer] CRT-D.  RHC in 7/17 showed low cardiac output and elevated filling pressures (PA 55/31, mean PCWP 32, CI 1.6).  Currently, he is volume overloaded on exam but creatinine is now significantly elevated.   SBP in 90s-100s, stable.  Co-ox not particularly low, now on low-dose milrinone at 0.125 with co-ox 76%.  CVP now 10-12 but by exam looks more volume overloaded.  Suspect ATN event from cardiac arrest with hypotension at time of initial admission.  UOP continues to improve slowly but creatinine stably elevated. We seem to be in something of a holding pattern right now.  - Continue low dose milrinone for now.  - Continue Lasix 160 mg IV every 8 hrs for now.   - In absence of renal recovery, prognosis poor.  - Place ted hose.  4. AKI on CKD stage III: Most likely hemodynamic => suspect ATN event in the setting of initial cardiac arrest with hypotension. Renal following (appreciate), not a good long-term HD candidate.  However, could envision short course of CVVH if needed to see if he recovers renal function. Right now, UOP picking up but creatinine remains plateaued. Hopefully we will see a turn in the next day or so. 5. Atrial fibrillation: History of PAF, has not been anticoagulated however. On heparin [place] for DVT prophylaxis.  6. Out of bed, PT consult.   Marca Ancona 10/04/2015 8:10 AM

## 2015-10-05 LAB — CBC
HEMATOCRIT: 25 % — AB (ref 39.0–52.0)
Hemoglobin: 8.6 g/dL — ABNORMAL LOW (ref 13.0–17.0)
MCH: 29.8 pg (ref 26.0–34.0)
MCHC: 34.4 g/dL (ref 30.0–36.0)
MCV: 86.5 fL (ref 78.0–100.0)
Platelets: 116 10*3/uL — ABNORMAL LOW (ref 150–400)
RBC: 2.89 MIL/uL — ABNORMAL LOW (ref 4.22–5.81)
RDW: 18.6 % — ABNORMAL HIGH (ref 11.5–15.5)
WBC: 4.8 10*3/uL (ref 4.0–10.5)

## 2015-10-05 LAB — BASIC METABOLIC PANEL
Anion gap: 13 (ref 5–15)
BUN: 78 mg/dL — AB (ref 6–20)
CHLORIDE: 97 mmol/L — AB (ref 101–111)
CO2: 25 mmol/L (ref 22–32)
Calcium: 8.1 mg/dL — ABNORMAL LOW (ref 8.9–10.3)
Creatinine, Ser: 3.99 mg/dL — ABNORMAL HIGH (ref 0.61–1.24)
GFR calc non Af Amer: 13 mL/min — ABNORMAL LOW (ref >60)
GFR, EST AFRICAN AMERICAN: 15 mL/min — AB (ref >60)
Glucose, Bld: 121 mg/dL — ABNORMAL HIGH (ref 65–99)
Potassium: 3.6 mmol/L (ref 3.5–5.1)
SODIUM: 135 mmol/L (ref 135–145)

## 2015-10-05 LAB — IRON AND TIBC
Iron: 31 ug/dL — ABNORMAL LOW (ref 45–182)
SATURATION RATIOS: 12 % — AB (ref 17.9–39.5)
TIBC: 263 ug/dL (ref 250–450)
UIBC: 232 ug/dL

## 2015-10-05 LAB — CARBOXYHEMOGLOBIN
CARBOXYHEMOGLOBIN: 1.7 % — AB (ref 0.5–1.5)
METHEMOGLOBIN: 1 % (ref 0.0–1.5)
O2 Saturation: 67.3 %
Total hemoglobin: 8.6 g/dL — ABNORMAL LOW (ref 12.0–16.0)

## 2015-10-05 NOTE — Progress Notes (Signed)
Physical Therapy Treatment Patient Details Name: Tom Nguyen MRN: [id number] DOB: Jun 06, 1932 Today's Date: 10/05/2015    History of Present Illness Braxxton Stoudt is an 82 year old male with past medical history of Asthma,CAD, CHF- Class 3,COPD, s/p AICD, Hypertension, Hyperlipidemia, MI, and sleep apnea. Patient presented to [facility] 09/24/15 with chest pain and shortness of breath. In the ED the patient was in V-tach and was defibrillated. 09/30/15 transfer to [facility] for consideration of LVAD. Nephrology following due to acute renal failure and ? will need CRRT (pt not a long-term dialysis candidate).     PT Comments    Patient ambulated on room air with rollator x 160 ft (2 standing rest breaks due to dyspnea). Patient with poor awareness of his deficits as he required prompts to stop and slow breathing. SaO2 95% on room air, 89+% with activity on room air. BP with appropriate increase with activity. Discussed discharge plan with patient and wife. He is hopeful he can return home from hospital, however acknowledges in his current state he would need short-term SNF.   Follow Up Recommendations  Supervision/Assistance - 24 hour;SNF     Equipment Recommendations  None recommended by PT    Recommendations for Other Services OT consult     Precautions / Restrictions Precautions Precautions: Fall Precaution Comments: one fall in past 12 months Restrictions Weight Bearing Restrictions: No    Mobility  Bed Mobility               General bed mobility comments: Received upright in recliner and left in chair at end of session. Not assessed  Transfers Overall transfer level: Needs assistance Equipment used: 4-wheeled walker Transfers: Sit to/from Stand Sit to Stand: Mod assist         General transfer comment: Patient required incr assist for anterior weight shift over his feet as well as vertical translation  Ambulation/Gait Ambulation/Gait assistance: Min assist Ambulation  Distance (Feet): 160 Feet Assistive device: 4-wheeled walker Gait Pattern/deviations: Step-through pattern;Decreased stride length;Drifts right/left Gait velocity: Decreased Gait velocity interpretation: Below normal speed for age/gender General Gait Details: 2 standing rest breaks due to dyspnea; pt very motivated to complete "1 lap" around nurses' station; assist to control/maneuver RW as he fatigued and began to drift Lt/Rt   Stairs            Wheelchair Mobility    Modified Rankin (Stroke Patients Only)       Balance           Standing balance support: Bilateral upper extremity supported Standing balance-Leahy Scale: Poor Standing balance comment: requires UE support for static standing                    Cognition Arousal/Alertness: Awake/alert Behavior During Therapy: Flat affect Overall Cognitive Status: Within Functional Limits for tasks assessed                      Exercises      General Comments General comments (skin integrity, edema, etc.): Wife, family members present. All in agreement that he needs to be stronger for wife to be able to care for him at home. Patient reluctantly agreeable (he hopes to go straight home, however will go to SNF if needed)      Pertinent Vitals/Pain Pain Assessment: No/denies pain    Home Living                      Prior Function  PT Goals (current goals can now be found in the care plan section) Acute Rehab PT Goals Patient Stated Goal: Return to prior level of function at home Time For Goal Achievement: 10/16/15 Progress towards PT goals: Progressing toward goals    Frequency    Min 3X/week      PT Plan Discharge plan needs to be updated    Co-evaluation             End of Session Equipment Utilized During Treatment: Gait belt Activity Tolerance: Patient limited by fatigue Patient left: in chair;with call bell/phone within reach;with family/visitor present  (with lunch tray)     Time: 1230-1300 PT Time Calculation (min) (ACUTE ONLY): 30 min  Charges:  $Gait Training: 23-37 mins                    G Codes:      Jeb Schloemer 10/20/15, 2:19 PM Pager [phone]

## 2015-10-05 NOTE — Progress Notes (Signed)
Patient ID: Tom Nguyen, male   DOB: April 26, 1932, 82 y.o.   MRN: [id number]      SUBJECTIVE: Patient transferred from [facility] 9/13.  He was initially admitted to [facility] after VT with ICD discharge, then developed progressive renal dysfunction with volume overload.  Creatinine up to 3.73.  He was started on dopamine 9/12 but had VT in 140s terminated by ATP.    Remains on milrinone 0.125 mcg/kg/min without VT. Coox 67.3%.   Good UO yesterday with IV lasix 160 q 8hrs.  Currently in NSR with BiV pacing.   Feeling good today.  Urinating well so far. Urine not very dark.   Out 1.1 L. Weight unchanged.  CVP 8-9  BUN and creatinine remain elevated.   Scheduled Meds: . allopurinol  100 mg Oral Daily  . arformoterol  15 mcg Nebulization BID   And  . umeclidinium bromide  1 puff Inhalation Daily  . carvedilol  3.125 mg Oral BID WC  . clopidogrel  75 mg Oral Daily  . folic acid  1 mg Oral Daily  . furosemide  160 mg Intravenous Q8H  . heparin  5,000 Units Subcutaneous Q8H  . leflunomide  20 mg Oral Daily  . levothyroxine  75 mcg Oral QAC breakfast  . mouth rinse  15 mL Mouth Rinse BID  . montelukast  10 mg Oral Daily  . pantoprazole  40 mg Oral Daily  . simvastatin  40 mg Oral q1800  . sodium chloride flush  10-40 mL Intracatheter Q12H  . sodium chloride flush  3 mL Intravenous Q12H   Continuous Infusions: . amiodarone 30 mg/hr (10/05/15 0400)  . milrinone 0.125 mcg/kg/min (10/04/15 2000)   PRN Meds:.sodium chloride, acetaminophen, benzocaine, ipratropium-albuterol, nitroGLYCERIN, ondansetron (ZOFRAN) IV, sodium chloride flush, sodium chloride flush    Vitals:   10/05/15 0500 10/05/15 0600 10/05/15 0736 10/05/15 0800  BP:  (!) 115/58  (!) 108/56  Pulse:  73  75  Resp:  20  19  Temp:    97.9 F (36.6 C)  TempSrc:    Oral  SpO2:  95% 94% 91%  Weight: 177 lb 14.6 oz (80.7 kg)     Height:        Intake/Output Summary (Last 24 hours) at 10/05/15 0813 Last data filed at 10/05/15 0600  Gross per 24 hour  Intake           1008.1 ml  Output             1750 ml  Net           -741.9 ml    LABS: Basic Metabolic Panel:  Recent Labs  53/97/67 1130 10/03/15 0445  10/04/15 0530 10/05/15 0224  NA  --  135  < > 134* 135  K  --  3.1*  < > 3.3* 3.6  CL  --  96*  < > 94* 97*  CO2  --  24  < > 25 25  GLUCOSE  --  85  < > 101* 121*  BUN  --  74*  < > 75* 78*  CREATININE  --  3.99*  < > 4.02* 3.99*  CALCIUM  --  8.1*  < > 8.0* 8.1*  MG 1.7 1.7  --   --   --   < > = values in this interval not displayed. Liver Function Tests: No results for input(s): AST, ALT, ALKPHOS, BILITOT, PROT, ALBUMIN in the last 72 hours. No results for input(s): LIPASE, AMYLASE in the  last 72 hours. CBC:  Recent Labs  10/04/15 0530 10/05/15 0224  WBC 6.1 4.8  HGB 8.7* 8.6*  HCT 25.3* 25.0*  MCV 86.9 86.5  PLT 109* 116*   Cardiac Enzymes: No results for input(s): CKTOTAL, CKMB, CKMBINDEX, TROPONINI in the last 72 hours. BNP: Invalid input(s): POCBNP D-Dimer: No results for input(s): DDIMER in the last 72 hours. Hemoglobin A1C: No results for input(s): HGBA1C in the last 72 hours. Fasting Lipid Panel: No results for input(s): CHOL, HDL, LDLCALC, TRIG, CHOLHDL, LDLDIRECT in the last 72 hours. Thyroid Function Tests: No results for input(s): TSH, T4TOTAL, T3FREE, THYROIDAB in the last 72 hours.  Invalid input(s): FREET3 Anemia Panel: No results for input(s): VITAMINB12, FOLATE, FERRITIN, TIBC, IRON, RETICCTPCT in the last 72 hours.  RADIOLOGY: US Renal  Result Date: 09/27/2015 CLINICAL DATA:  Acute renal failure. EXAM: RENAL / URINARY TRACT ULTRASOUND COMPLETE COMPARISON:  08/07/2015 FINDINGS: Right Kidney: Length: 9.9 cm. Cortical thinning noted with slight increased cortical echogenicity. No hydronephrosis. Left Kidney: Length: 11.5 cm multiple simple appearing cysts are identified, as before. No hydronephrosis. Echogenicity within normal limits. No mass or hydronephrosis  visualized. Bladder: Appears normal for degree of bladder distention. Other:  Small volume ascites noted with 2.8 cm hepatic cyst evident. IMPRESSION: No evidence for hydronephrosis. Electronically Signed   By: Kennith Center M.D.   On: 09/27/2015 10:30   Dg Chest Port 1 View  Result Date: 09/30/2015 CLINICAL DATA:  Central line placement EXAM: PORTABLE CHEST 1 VIEW COMPARISON:  09/25/2015 FINDINGS: Left pacer remains in place, unchanged. Interval placement of left internal jugular central line. The tip projects superiorly into the lateral wall of the SVC or possibly into the lower right innominate vein. No pneumothorax. There is cardiomegaly with vascular congestion. Bibasilar atelectasis and small effusions. IMPRESSION: Left PICC line tip projects superiorly into the lateral wall of the SVC or possibly into the lower portion of the right innominate vein. Cardiomegaly with vascular congestion. Small effusions with bibasilar atelectasis. Electronically Signed   By: Charlett Nose M.D.   On: 09/30/2015 14:10   Dg Chest Port 1 View  Result Date: 09/25/2015 CLINICAL DATA:  82 year old male with shortness of breath. EXAM: PORTABLE CHEST 1 VIEW COMPARISON:  Chest radiograph dated 09/24/2015 FINDINGS: There is stable moderate cardiomegaly with minimal central vascular prominence. There is no focal consolidation, pleural effusion, or pneumothorax. Right apical pleural thickening. Left pectoral AICD device and median sternotomy wires noted. There is osteopenia with degenerative changes of the spine. Bilateral shoulder arthroplasty. No acute fracture. IMPRESSION: Cardiomegaly with probable mild congestive changes. No focal consolidation or edema. Electronically Signed   By: Elgie Collard M.D.   On: 09/25/2015 02:59   Dg Chest Port 1 View  Result Date: 09/24/2015 CLINICAL DATA:  Dyspnea EXAM: PORTABLE CHEST 1 VIEW COMPARISON:  08/12/2015 FINDINGS: Cardiomegaly again noted. Status post median sternotomy. 3 leads  cardiac pacemaker is unchanged in position. No infiltrate or pulmonary edema. Bilateral shoulder prosthesis again noted. IMPRESSION: Cardiomegaly. No active disease. Cardiac pacemaker unchanged in position. Electronically Signed   By: Natasha Mead M.D.   On: 09/24/2015 20:24    PHYSICAL EXAM General: Elderly and chronically ill appearing.  Neck: JVP 8-9 cm, no thyromegaly or thyroid nodule.  Lungs: Diminished bibasilar sounds, with scant crackles.  CV: Lateral PMI.  Heart sounds distant, regular S1/S2, no S3/S4, no murmur.  1+edema 1/3 to knees bilaterally.   Abdomen: Soft, NT, ND, no HSM. No bruits or masses. +BS  Neurologic: Alert and  oriented x 3.  Psych: Normal affect. Extremities: No clubbing or cyanosis.   TELEMETRY: Reviewed personally, NSR with BiV pacing.   ASSESSMENT AND PLAN: 63 with history of CAD s/p CABG, ischemic cardiomyopathy s/p [employer] CRT-D, paroxysmal atrial fibrillation, CKD admitted with VT and ICD discharge.  At [facility], he developed progressive volume overload and renal dysfunction, had VT again with dopamine use.  Transferred to [facility].   1. VT: Appropriate ICD discharge prior to admission.  Had another VT episode in hospital on dopamine, terminated with ATP. Do not think this was triggered by ACS, likely scar-mediated. No chest pain.  - Continue amiodarone gtt while on low dose milrinone.  2. CAD: s/p CABG.  Had LHC in 7/17 with no options for revascularization.  Would not repeat cath (especially with AKI). Elevated troponin at admission may be demand ischemia with volume overload and defibrillation.  - Continue Plavix, statin.  3. Acute on chronic systolic CHF: EF 88-28%, moderate MR, moderately decreased RV systolic function, moderate TR on 7/17 echo, ischemic cardiomyopathy.  [employer] CRT-D.  RHC in 7/17 showed low cardiac output and elevated filling pressures (PA 55/31, mean PCWP 32, CI 1.6). Remains mildly volume overloaded,though creatinine remains  significantly elevated.   - Continue low dose milrinone for today.  - Continue Lasix 160 mg IV every 8 hrs for today.  Likely transition to po tomorrow.    - In absence of renal recovery, prognosis poor.  - Continue ted hose. (not on this am, will re-order) 4. AKI on CKD stage III: Most likely hemodynamic => suspect ATN event in the setting of initial cardiac arrest with hypotension. Renal following (appreciate), not a good long-term HD candidate.  However, could envision short course of CVVH if needed to see if he recovers renal function.  - UOP has improved, but creatinine remains elevated.  5. Atrial fibrillation: History of PAF, has not been anticoagulated however. On heparin [place] for DVT prophylaxis.  6. Out of bed, PT consult.   Graciella Freer, PA-C 10/05/2015 8:13 AM  Advanced Heart Failure Team Pager [phone] (M-F; 7a - 4p)  Please contact [facility] for night-coverage after hours (4p -7a ) and weekends on [email or web address]  Patient seen with PA, agree with the above note.  CVP improving, creatinine seems to have hit a plateau.  Continue current IV Lasix today, possibly transition to torsemide tomorrow.  Will aim to stop milrinone once he is back on po diuretics.   He will need outpatient nephrology followup.   Very weak, has been in bed for > 1 week.  I suspect he will need a rehab stay after discharge.  Awaiting PT.   Marca Ancona 10/05/2015 8:54 AM

## 2015-10-05 NOTE — Progress Notes (Signed)
1400 Came to see pt to walk. Talked with pt's RN. Pt just walked 350 ft with PT. Will follow up tomorrow. Luetta Nutting RN BSN 10/05/2015 2:22 PM

## 2015-10-05 NOTE — Progress Notes (Signed)
Patient ID: Tom Nguyen, male   DOB: Apr 22, 1932, 82 y.o.   MRN: [id number]  [facility] Progress Note    Subjective:    Continues with IV lasix UOP 1.75 liters past 24 hours, sl lower than previous 2 days Weight unchanged at 80.7 kg Creatinine stalled at around 4 On IV milrinone and IV amio  Says the O2 burns his nose Breathing about the same Reiterates that he does not want dialysis  Objective:   BP (!) 108/56 (BP Location: Left Arm)   Pulse 75   Temp 97.9 F (36.6 C) (Oral)   Resp 19   Ht 5\' 8"  (1.727 m)   Wt 80.7 kg (177 lb 14.6 oz)   SpO2 91%   BMI 27.05 kg/m   Intake/Output Summary (Last 24 hours) at 10/05/15 0900 Last data filed at 10/05/15 0600  Gross per 24 hour  Intake            981.4 ml  Output             1750 ml  Net           -768.6 ml    Physical Exam: VS as noted CVP 8 Up on the bedside commode Regular (paced) rhythm S1 and S2 normal Crackles R base, otherwise diminished Trace sacral edema Soft, obese, nontender abdomen Ext: 1+ pitting upper and lower extremity edema bilaterally (he says no different)  Labs:  Recent Labs Lab 09/29/15 0459 09/30/15 0459  10/01/15 1741 10/02/15 0310 10/02/15 0730 10/03/15 0445 10/03/15 1646 10/04/15 0030 10/04/15 0530 10/05/15 0224  NA 133* 130*  < > 130* 130* 132* 135 133*  --  134* 135  K 4.0 4.4  < > 4.7 6.2* 3.2* 3.1* 2.9* 3.4* 3.3* 3.6  CL 101 99*  < > 96* 96* 98* 96* 96*  --  94* 97*  CO2 22 20*  < > 22 23 22 24 24   --  25 25  GLUCOSE 96 122*  < > 112* 94 89 85 152*  --  101* 121*  BUN 58* 65*  < > 67* 69* 69* 74* 74*  --  75* 78*  CREATININE 3.54* 3.73*  < > 3.81* 3.71* 3.91* 3.99* 4.12*  --  4.02* 3.99*  CALCIUM 8.3* 8.3*  < > 8.0* 7.8* 7.9* 8.1* 8.0*  --  8.0* 8.1*  PHOS 4.1 4.0  --   --   --   --   --   --   --   --   --   < > = values in this interval not displayed.   Recent Labs Lab 10/02/15 0615 10/03/15 0445 10/04/15 0530 10/05/15 0224  WBC 7.6 5.5 6.1 4.8  HGB  9.0* 8.4* 8.7* 8.6*  HCT 26.6* 25.0* 25.3* 25.0*  MCV 89.6 88.0 86.9 86.5  PLT 73* 107* 109* 116*    Medications:    . allopurinol  100 mg Oral Daily  . arformoterol  15 mcg Nebulization BID   And  . umeclidinium bromide  1 puff Inhalation Daily  . carvedilol  3.125 mg Oral BID WC  . clopidogrel  75 mg Oral Daily  . folic acid  1 mg Oral Daily  . furosemide  160 mg Intravenous Q8H  . heparin  5,000 Units Subcutaneous Q8H  . leflunomide  20 mg Oral Daily  . levothyroxine  75 mcg Oral QAC breakfast  . mouth rinse  15 mL Mouth Rinse BID  . montelukast  10 mg Oral Daily  .  pantoprazole  40 mg Oral Daily  . simvastatin  40 mg Oral q1800  . sodium chloride flush  10-40 mL Intracatheter Q12H  . sodium chloride flush  3 mL Intravenous Q12H   Infusion Medications . amiodarone 30 mg/hr (10/05/15 0400)  . milrinone 0.125 mcg/kg/min (10/04/15 2000)   Background: 82 year old gentleman transferred from [facility] with bifactorial AKI  d/t chronic cardiorenal syndrome (EF 20-25%) with acute component related to exacerbation of CHF plus possible ischemic ATN after episode pf VT/relative hypotension with renal hypoperfusion injury.  On discussions Dr. Allena Katz had with the patient-he is well aware of his poor candidacy as a long-term dialysis patient and supports not pursuing chronic hemodialysis, but is open to short-term dialysis/CRRT with time limits should that be necessary. Baseline creatinine (07/2015) appears to be around 1.4-1.5, up to 1.7 on admission to [facility], with peak creatinine since transfer to [facility] 4.12 on 10/04/15.  ([place] 9.9, 11.5 cm kidneys without hydro, UA entirely benign).   Assessment/ Plan:    Acute renal failure on chronic kidney disease stage III:  Suspected dual injury from ischemic ATN from V. tach/relative hypotension and CHF exacerbation with renal hypoperfusion. Creatinine "stalled" at around 4, no uremic symptoms and still responding to IV lasix though remains volume overloaded He  does not want (and is not candidate for) long term HD and so far no hard indication to initiate CRRT Continue lasix IV today and monitor renal function  Anemia Hb 8.6 History of Fe def Check iron studies/IV Fe if indicated/dose of Aranesp if Fe stores adequate  CAD/prior CABG Elev trop demand isch on adm No repeat cath planned  Combined systolic/diastolic heart failure  EF 25-30% w/MR, TR On milrinone/IV lasix 160 TID UOP reasonable and though weight not changed at all, net neg close to 600 cc past 24 hours Hope can move to po diuretic regimen in the next day or so  S/p VT  S/p defibrillation and then chemical cardioversion Remains in sinus rhythm on IV amio  Hypokalemia:  K 3.6 today after 3 doses of 40 po yesterday No standing doses ordered   Camille Bal, MD [facility] [phone] Pager 10/05/2015, 9:00 AM

## 2015-10-05 NOTE — Progress Notes (Signed)
Patient refused CPAP for the night due to nose being raw. Patient informed to let RN know if he changes his mind at anytime.

## 2015-10-06 LAB — RENAL FUNCTION PANEL
ALBUMIN: 2.6 g/dL — AB (ref 3.5–5.0)
Anion gap: 15 (ref 5–15)
BUN: 78 mg/dL — AB (ref 6–20)
CALCIUM: 8.2 mg/dL — AB (ref 8.9–10.3)
CO2: 27 mmol/L (ref 22–32)
CREATININE: 4.07 mg/dL — AB (ref 0.61–1.24)
Chloride: 95 mmol/L — ABNORMAL LOW (ref 101–111)
GFR, EST AFRICAN AMERICAN: 15 mL/min — AB (ref >60)
GFR, EST NON AFRICAN AMERICAN: 13 mL/min — AB (ref >60)
Glucose, Bld: 86 mg/dL (ref 65–99)
Phosphorus: 3.6 mg/dL (ref 2.5–4.6)
Potassium: 3.1 mmol/L — ABNORMAL LOW (ref 3.5–5.1)
SODIUM: 137 mmol/L (ref 135–145)

## 2015-10-06 LAB — CBC
HCT: 24 % — ABNORMAL LOW (ref 39.0–52.0)
HEMOGLOBIN: 8.3 g/dL — AB (ref 13.0–17.0)
MCH: 30.1 pg (ref 26.0–34.0)
MCHC: 34.6 g/dL (ref 30.0–36.0)
MCV: 87 fL (ref 78.0–100.0)
Platelets: 81 10*3/uL — ABNORMAL LOW (ref 150–400)
RBC: 2.76 MIL/uL — AB (ref 4.22–5.81)
RDW: 19 % — ABNORMAL HIGH (ref 11.5–15.5)
WBC: 5 10*3/uL (ref 4.0–10.5)

## 2015-10-06 LAB — MAGNESIUM: MAGNESIUM: 1.6 mg/dL — AB (ref 1.7–2.4)

## 2015-10-06 MED ORDER — TORSEMIDE 20 MG PO TABS
60.0000 mg | ORAL_TABLET | Freq: Two times a day (BID) | ORAL | Status: DC
  Administered 2015-10-06 – 2015-10-07 (×2): 60 mg via ORAL
  Filled 2015-10-06 (×3): qty 3

## 2015-10-06 MED ORDER — FUROSEMIDE 10 MG/ML IJ SOLN
160.0000 mg | Freq: Once | INTRAVENOUS | Status: AC
  Administered 2015-10-06: 160 mg via INTRAVENOUS
  Filled 2015-10-06: qty 16

## 2015-10-06 MED ORDER — SODIUM CHLORIDE 0.9 % IV SOLN
510.0000 mg | INTRAVENOUS | Status: DC
  Administered 2015-10-06: 510 mg via INTRAVENOUS
  Filled 2015-10-06: qty 17

## 2015-10-06 MED ORDER — MAGNESIUM SULFATE 2 GM/50ML IV SOLN
2.0000 g | Freq: Once | INTRAVENOUS | Status: AC
  Administered 2015-10-06: 2 g via INTRAVENOUS
  Filled 2015-10-06: qty 50

## 2015-10-06 MED ORDER — DARBEPOETIN ALFA 100 MCG/0.5ML IJ SOSY
100.0000 ug | PREFILLED_SYRINGE | INTRAMUSCULAR | Status: DC
  Administered 2015-10-06: 100 ug via SUBCUTANEOUS
  Filled 2015-10-06 (×2): qty 0.5

## 2015-10-06 MED ORDER — MAGNESIUM SULFATE 4 GM/100ML IV SOLN
4.0000 g | Freq: Once | INTRAVENOUS | Status: DC
  Filled 2015-10-06: qty 100

## 2015-10-06 MED ORDER — TORSEMIDE 20 MG PO TABS: 40.0000 mg | ORAL_TABLET | Freq: Two times a day (BID) | ORAL | Status: DC

## 2015-10-06 MED ORDER — POTASSIUM CHLORIDE CRYS ER 20 MEQ PO TBCR: 40.0000 meq | EXTENDED_RELEASE_TABLET | Freq: Three times a day (TID) | ORAL | Status: DC

## 2015-10-06 MED ORDER — POTASSIUM CHLORIDE CRYS ER 20 MEQ PO TBCR
40.0000 meq | EXTENDED_RELEASE_TABLET | Freq: Two times a day (BID) | ORAL | Status: AC
  Administered 2015-10-06 (×2): 40 meq via ORAL
  Filled 2015-10-06 (×2): qty 2

## 2015-10-06 NOTE — [place] FL2 (Signed)
[place] MEDICAID FL2 LEVEL OF CARE SCREENING TOOL     IDENTIFICATION  Patient Name: Tom Nguyen Birthdate: Oct 04, 1932 Sex: male Admission Date (Current Location): 09/30/2015  [facility] and [place] Number:  Producer, television/film/video and Address:  [facility] [place], [address] [place], [place] [address]      Provider Number: [id number]  Attending Physician Name and Address:  Laurey Morale, MD  Relative Name and Phone Number:  Dewayne Hatch, spouse, [phone]    Current Level of Care: Hospital Recommended Level of Care: Skilled Nursing Facility Prior Approval Number:    Date Approved/Denied:   PASRR Number: [id number]  Discharge Plan: SNF    Current Diagnoses: Patient Active Problem List   Diagnosis Date Noted  . Cardiogenic shock (HCC) 09/30/2015  . ARF (acute renal failure) (HCC)   . Ventricular tachycardia (HCC)   . Sustained ventricular fibrillation (HCC) 09/25/2015  . Acute on chronic systolic (congestive) heart failure (HCC) 08/21/2015  . Obstructive sleep apnea 08/21/2015  . Weakness generalized   . S/P CABG (coronary artery bypass graft)   . S/P ICD (internal cardiac defibrillator) procedure   . DNR (do not resuscitate) discussion 08/13/2015  . Palliative care encounter 08/13/2015  . Cardiorenal syndrome with renal failure - improving with IV Lasix 08/08/2015  . Elevated troponin   . Dyspnea 08/06/2015  . Atherosclerosis of coronary artery bypass graft of native heart without angina pectoris   . Pulmonary HTN (HCC)   . Centrilobular emphysema (HCC)   . Anemia 06/17/2015  . Weight loss 04/24/2015  . Preventative health care 10/21/2014  . HLD (hyperlipidemia) 10/21/2014  . HTN (hypertension) 10/21/2014  . CKD (chronic kidney disease) stage 3, GFR 30-59 ml/min 10/21/2014  . GERD (gastroesophageal reflux disease) 10/21/2014  . Osteoporosis 10/21/2014  . CAD (coronary artery disease) 10/21/2014  . COPD (chronic obstructive pulmonary disease)  (HCC) 10/21/2014  . Rheumatoid arthritis (HCC) 10/21/2014  . Hypothyroidism 10/21/2014  . Gout 10/21/2014  . Cardiomyopathy, ischemic 10/21/2014    Orientation RESPIRATION BLADDER Height & Weight     Situation, Time, Self  Normal Continent Weight: 79.8 kg (175 lb 14.8 oz) Height:  5\' 8"  (172.7 cm)  BEHAVIORAL SYMPTOMS/MOOD NEUROLOGICAL BOWEL NUTRITION STATUS      Continent Diet (Please see DC summary)  AMBULATORY STATUS COMMUNICATION OF NEEDS Skin   Limited Assist Verbally Normal                       Personal Care Assistance Level of Assistance  Bathing, Feeding, Dressing Bathing Assistance: Maximum assistance Feeding assistance: Independent Dressing Assistance: Limited assistance     Functional Limitations Info             SPECIAL CARE FACTORS FREQUENCY  PT (By licensed PT)     PT Frequency: 5x/week              Contractures      Additional Factors Info  Code Status, Allergies Code Status Info: Full Allergies Info: NKA           Current Medications (10/06/2015):  This is the current hospital active medication list Current Facility-Administered Medications  Medication Dose Route Frequency Provider Last Rate Last Dose  . 0.9 %  sodium chloride infusion  250 mL Intravenous PRN 10/08/2015, MD 17 mL/hr at 10/06/15 0800 250 mL at 10/06/15 0800  . acetaminophen (TYLENOL) tablet 650 mg  650 mg Oral Q4H PRN 10/08/15, Ellsworth Lennox      .  allopurinol (ZYLOPRIM) tablet 100 mg  100 mg Oral Daily Ellsworth Lennox, [place]   100 mg at 10/05/15 0846  . amiodarone (NEXTERONE PREMIX) 360-4.14 MG/200ML-% (1.8 mg/mL) IV infusion  30 mg/hr Intravenous Continuous Laurey Morale, MD 16.7 mL/hr at 10/06/15 0800 30 mg/hr at 10/06/15 0800  . arformoterol (BROVANA) nebulizer solution 15 mcg  15 mcg Nebulization BID Laurey Morale, MD   15 mcg at 10/06/15 0740   And  . umeclidinium bromide (INCRUSE ELLIPTA) 62.5 MCG/INH 1 puff  1 puff Inhalation Daily Laurey Morale, MD    1 puff at 10/06/15 0740  . benzocaine (ORAJEL) 10 % mucosal gel   Mouth/Throat BID PRN Greig Castilla Means, MD   1 application at 10/03/15 1821  . carvedilol (COREG) tablet 3.125 mg  3.125 mg Oral BID WC Ellsworth Lennox, PA   3.125 mg at 10/05/15 1647  . clopidogrel (PLAVIX) tablet 75 mg  75 mg Oral Daily Ellsworth Lennox, [place]   75 mg at 10/05/15 0846  . folic acid (FOLVITE) tablet 1 mg  1 mg Oral Daily Ellsworth Lennox, [place]   1 mg at 10/05/15 0846  . furosemide (LASIX) 160 mg in dextrose 5 % 50 mL IVPB  160 mg Intravenous Once Amy D Clegg, NP      . heparin injection 5,000 Units  5,000 Units Subcutaneous Q8H Ellsworth Lennox, [place]   5,000 Units at 10/06/15 0520  . ipratropium-albuterol (DUONEB) 0.5-2.5 (3) MG/3ML nebulizer solution 3 mL  3 mL Nebulization Q4H PRN Laurey Morale, MD      . leflunomide (ARAVA) tablet 20 mg  20 mg Oral Daily Ellsworth Lennox, [place]   20 mg at 10/05/15 0847  . levothyroxine (SYNTHROID, LEVOTHROID) tablet 75 mcg  75 mcg Oral QAC breakfast Ellsworth Lennox, [place]   75 mcg at 10/05/15 0846  . magnesium sulfate IVPB 4 g 100 mL  4 g Intravenous Once Amy D Clegg, NP      . MEDLINE mouth rinse  15 mL Mouth Rinse BID Laurey Morale, MD   15 mL at 10/05/15 2305  . milrinone (PRIMACOR) 20 MG/100 ML (0.2 mg/mL) infusion  0.125 mcg/kg/min Intravenous Continuous Graciella Freer, PA-C 3 mL/hr at 10/06/15 0800 0.125 mcg/kg/min at 10/06/15 0800  . montelukast (SINGULAIR) tablet 10 mg  10 mg Oral Daily Ellsworth Lennox, [place]   10 mg at 10/05/15 0846  . nitroGLYCERIN (NITROLINGUAL) 0.4 MG/SPRAY spray 1 spray  1 spray Sublingual Q5 Min x 3 PRN Ellsworth Lennox, [place]      . ondansetron [facility]) injection 4 mg  4 mg Intravenous Q6H PRN Ellsworth Lennox, PA      . pantoprazole (PROTONIX) EC tablet 40 mg  40 mg Oral Daily Ellsworth Lennox, [place]   40 mg at 10/05/15 0846  . potassium chloride SA (K-DUR,KLOR-CON) CR tablet 40 mEq  40 mEq Oral BID Amy D Clegg, NP      .  simvastatin (ZOCOR) tablet 40 mg  40 mg Oral q1800 Ellsworth Lennox, PA   40 mg at 10/05/15 1647  . sodium chloride flush (NS) 0.9 % injection 10-40 mL  10-40 mL Intracatheter Q12H Laurey Morale, MD   10 mL at 10/05/15 1000  . sodium chloride flush (NS) 0.9 % injection 10-40 mL  10-40 mL Intracatheter PRN Laurey Morale, MD      . sodium chloride flush (NS) 0.9 % injection 3 mL  3 mL  Intravenous Q12H Ellsworth Lennox, PA   3 mL at 10/05/15 1000  . sodium chloride flush (NS) 0.9 % injection 3 mL  3 mL Intravenous PRN Ellsworth Lennox, PA      . torsemide [facility]) tablet 40 mg  40 mg Oral BID Amy Georgie Chard, NP         Discharge Medications: Please see discharge summary for a list of discharge medications.  Relevant Imaging Results:  Relevant Lab Results:   Additional Information SSN: [id number] [place], [place]

## 2015-10-06 NOTE — Progress Notes (Addendum)
RT spoke with patient about wearing CPAP and the patient refused to wear CPAP due to complaint of nose burning from wearing oxygen.  RN present for conversation

## 2015-10-06 NOTE — Progress Notes (Signed)
Patient ID: Tom Nguyen, male   DOB: Aug 23, 1932, 82 y.o.   MRN: [id number]  [facility] Progress Note    Subjective:    Continues with IV lasix and good UOP response 2385 ml/24 hours  Was on 160 TID - cards has ordered a single dose of 160 for today and per their note plan to change to torsemide 40 BID  Still on IV milrinone and IV amio  Objective:   BP (!) 102/54   Pulse 74   Temp 97.3 F (36.3 C) (Oral)   Resp 19   Ht 5\' 8"  (1.727 m)   Wt 79.8 kg (175 lb 14.8 oz)   SpO2 91%   BMI 26.75 kg/m   Intake/Output Summary (Last 24 hours) at 10/06/15 0915 Last data filed at 10/06/15 0800  Gross per 24 hour  Intake           1192.1 ml  Output             2385 ml  Net          -1192.9 ml    Physical Exam: VS as noted CVP 13 In bed, comfortable, not wearing O2 as is uncomfortable on his nose Regular (paced) rhythm S1 and S2 normal Breath sounds diminished bilaterally, crackles R base Trace sacral edema Soft, obese, nontender abdomen Ext: 1+ pitting upper and 2+  lower extremity edema bilaterally   Labs:  Recent Labs Lab 09/30/15 0459  10/02/15 0310 10/02/15 0730 10/03/15 0445 10/03/15 1646 10/04/15 0030 10/04/15 0530 10/05/15 0224 10/06/15 0500  NA 130*  < > 130* 132* 135 133*  --  134* 135 137  K 4.4  < > 6.2* 3.2* 3.1* 2.9* 3.4* 3.3* 3.6 3.1*  CL 99*  < > 96* 98* 96* 96*  --  94* 97* 95*  CO2 20*  < > 23 22 24 24   --  25 25 27   GLUCOSE 122*  < > 94 89 85 152*  --  101* 121* 86  BUN 65*  < > 69* 69* 74* 74*  --  75* 78* 78*  CREATININE 3.73*  < > 3.71* 3.91* 3.99* 4.12*  --  4.02* 3.99* 4.07*  CALCIUM 8.3*  < > 7.8* 7.9* 8.1* 8.0*  --  8.0* 8.1* 8.2*  PHOS 4.0  --   --   --   --   --   --   --   --  3.6  < > = values in this interval not displayed.   Recent Labs Lab 10/03/15 0445 10/04/15 0530 10/05/15 0224 10/06/15 0500  WBC 5.5 6.1 4.8 5.0  HGB 8.4* 8.7* 8.6* 8.3*  HCT 25.0* 25.3* 25.0* 24.0*  MCV 88.0 86.9 86.5 87.0  PLT 107* 109*  116* 81*    Medications:    . allopurinol  100 mg Oral Daily  . arformoterol  15 mcg Nebulization BID   And  . umeclidinium bromide  1 puff Inhalation Daily  . carvedilol  3.125 mg Oral BID WC  . clopidogrel  75 mg Oral Daily  . folic acid  1 mg Oral Daily  . furosemide  160 mg Intravenous Once  . heparin  5,000 Units Subcutaneous Q8H  . leflunomide  20 mg Oral Daily  . levothyroxine  75 mcg Oral QAC breakfast  . magnesium sulfate 1 - 4 g bolus IVPB  4 g Intravenous Once  . mouth rinse  15 mL Mouth Rinse BID  . montelukast  10 mg Oral Daily  .  pantoprazole  40 mg Oral Daily  . potassium chloride  40 mEq Oral BID  . simvastatin  40 mg Oral q1800  . sodium chloride flush  10-40 mL Intracatheter Q12H  . sodium chloride flush  3 mL Intravenous Q12H  . torsemide  40 mg Oral BID   Infusion Medications . amiodarone 30 mg/hr (10/06/15 0800)  . milrinone 0.125 mcg/kg/min (10/06/15 0800)   Background: 82 year old gentleman transferred from [facility] with bifactorial AKI  d/t chronic cardiorenal syndrome (EF 20-25%) with acute component related to exacerbation of CHF plus possible ischemic ATN after episode pf VT/relative hypotension with renal hypoperfusion injury.  On discussions Dr. Allena Katz had with the patient-he is well aware of his poor candidacy as a long-term dialysis patient and supports not pursuing chronic hemodialysis, but is open to short-term dialysis/CRRT with time limits should that be necessary. Baseline creatinine (07/2015) appears to be around 1.4-1.5, up to 1.7 on admission to [facility], with peak creatinine since transfer to [facility] 4.12 on 10/04/15.  ([place] 9.9, 11.5 cm kidneys without hydro, UA entirely benign).   Assessment/ Plan:    Acute renal failure on chronic kidney disease stage III:  Dual injury from ischemic ATN from V. tach/relative hypotension and CHF exacerbation with renal hypoperfusion.  Creatinine "stalled" at around 4, no uremic symptoms and still responding to IV lasix  though remains volume overloaded He does not want (and is not candidate for) long term HD  Anemia Hb 8.3 Currently Fe def and  tsat 12 Give Feraheme 510 IV X 2 doses spaced a week apart, plus Aranesp 100/week for now  CAD/prior CABG Elev trop demand isch on adm No repeat cath planned  Combined systolic/diastolic heart failure  EF 25-30% w/MR, TR On milrinone/IV lasix 160 TID Excellent UOP response - still with fair amount of edema Cards is reducing IV lasix - another 160 mg today with plans to go to torsemide 40 BID orally tomorrow (and wean milrinone at some point)   S/p VT  S/p defibrillation and then chemical cardioversion Remains in sinus rhythm on IV amio  Hypokalemia:  K 3.1 - primary team has ordered 40 po X 2 today   Camille Bal, MD [facility] [phone] Pager 10/06/2015, 9:15 AM

## 2015-10-06 NOTE — Clinical Social Work Note (Signed)
Clinical Social Work Assessment  Patient Details  Name: Tom Nguyen MRN: [id number] Date of Birth: 17-May-1932  Date of referral:  10/06/15               Reason for consult:  Facility Placement                Permission sought to share information with:  Facility Sport and exercise psychologist, Family Supports Permission granted to share information::  Yes, Release of Information Signed  Name::     Tom Nguyen, Tom Nguyen::  SNFs  Relationship::  Spouse  Contact Information:  [phone]  Housing/Transportation Living arrangements for the past 2 months:  Single Family Home Source of Information:  Adult Children, Spouse Patient Interpreter Needed:  None Criminal Activity/Legal Involvement Pertinent to Current Situation/Hospitalization:  No - Comment as needed Significant Relationships:  Adult Children, Spouse Lives with:  Spouse Do you feel safe going back to the place where you live?  No Need for family participation in patient care:  Yes (Comment)  Care giving concerns:  CSW received consult for possible SNF placement at time of discharge. CSW met with patient and patient's spouse at bedside regarding PT recommendation of SNF placement at time of discharge. Per patient's spouse, patient's spouse is currently unable to care for patient at their home given patient's current physical needs and fall risk. Patient was very sleepy and patient's spouse expressed understanding of PT recommendation and is agreeable to SNF placement at time of discharge. CSW to continue to follow and assist with discharge planning needs.   Social Worker assessment / plan:  CSW spoke with patient and patient's spouse concerning possibility of rehab at [facility] before returning home.  Employment status:  Retired Forensic scientist:  Medicare PT Recommendations:  [place] / Referral to community resources:  [place]  Patient/Family's Response to care:  Patient and patient's spouse  recognize need for rehab before returning home and are agreeable to a SNF in [place]. Patient reported preference for [facility].  Patient/Family's Understanding of and Emotional Response to Diagnosis, Current Treatment, and Prognosis:  Patient/family is realistic regarding therapy needs and expressed being hopeful for SNF placement. Patient expressed understanding of CSW role and discharge process. No questions/concerns about plan or treatment.    Emotional Assessment Appearance:  Appears stated age Attitude/Demeanor/Rapport:  Unable to Assess, Sedated Affect (typically observed):  Unable to Assess Orientation:  Oriented to Self, Oriented to Place, Oriented to Situation, Oriented to  Time Alcohol / Substance use:  Not Applicable Psych involvement (Current and /or in the community):  No (Comment)  Discharge Needs  Concerns to be addressed:  Care Coordination Readmission within the last 30 days:  Yes Current discharge risk:  None Barriers to Discharge:  Continued Medical Work up   [employer], [place] 10/06/2015, 11:08 AM

## 2015-10-06 NOTE — Progress Notes (Signed)
Patient and family have chosen [facility] SNF once stable for discharge. [facility] aware.  Osborne Casco Ethan Kasperski LCSWA [phone]

## 2015-10-06 NOTE — Progress Notes (Signed)
Patient ID: Tom Nguyen, male   DOB: Jan 01, 1933, 82 y.o.   MRN: [id number]      SUBJECTIVE: Patient transferred from [facility] 9/13.  He was initially admitted to [facility] after VT with ICD discharge, then developed progressive renal dysfunction with volume overload.  Creatinine up to 3.73.  He was started on dopamine 9/12 but had VT in 140s terminated by ATP.    Remains on milrinone 0.125 mcg/kg/min without VT. Coox 67.3%.   Making over 2 liters of urine.  Creatinine has plateaued around 4.   Currently in NSR with BiV pacing.   Denies SOB . Complaining of fatigue. Says hs is ok with SNF for Rehab .      Scheduled Meds: . allopurinol  100 mg Oral Daily  . arformoterol  15 mcg Nebulization BID   And  . umeclidinium bromide  1 puff Inhalation Daily  . carvedilol  3.125 mg Oral BID WC  . clopidogrel  75 mg Oral Daily  . folic acid  1 mg Oral Daily  . furosemide  160 mg Intravenous Q8H  . heparin  5,000 Units Subcutaneous Q8H  . leflunomide  20 mg Oral Daily  . levothyroxine  75 mcg Oral QAC breakfast  . mouth rinse  15 mL Mouth Rinse BID  . montelukast  10 mg Oral Daily  . pantoprazole  40 mg Oral Daily  . simvastatin  40 mg Oral q1800  . sodium chloride flush  10-40 mL Intracatheter Q12H  . sodium chloride flush  3 mL Intravenous Q12H   Continuous Infusions: . amiodarone 30 mg/hr (10/06/15 0430)  . milrinone 0.125 mcg/kg/min (10/06/15 0000)   PRN Meds:.sodium chloride, acetaminophen, benzocaine, ipratropium-albuterol, nitroGLYCERIN, ondansetron (ZOFRAN) IV, sodium chloride flush, sodium chloride flush    Vitals:   10/06/15 0400 10/06/15 0500 10/06/15 0600 10/06/15 0740  BP: (!) 116/56  121/63   Pulse: 74  75   Resp: (!) 23  16   Temp:      TempSrc:      SpO2: 94%  94% 94%  Weight:  175 lb 14.8 oz (79.8 kg)    Height:        Intake/Output Summary (Last 24 hours) at 10/06/15 0803 Last data filed at 10/06/15 0600  Gross per 24 hour  Intake           1145.4 ml  Output              2260 ml  Net          -1114.6 ml    LABS: Basic Metabolic Panel:  Recent Labs  58/52/77 0224 10/06/15 0500  NA 135 137  K 3.6 3.1*  CL 97* 95*  CO2 25 27  GLUCOSE 121* 86  BUN 78* 78*  CREATININE 3.99* 4.07*  CALCIUM 8.1* 8.2*  MG  --  1.6*  PHOS  --  3.6   Liver Function Tests:  Recent Labs  10/06/15 0500  ALBUMIN 2.6*   No results for input(s): LIPASE, AMYLASE in the last 72 hours. CBC:  Recent Labs  10/05/15 0224 10/06/15 0500  WBC 4.8 5.0  HGB 8.6* 8.3*  HCT 25.0* 24.0*  MCV 86.5 87.0  PLT 116* 81*   Cardiac Enzymes: No results for input(s): CKTOTAL, CKMB, CKMBINDEX, TROPONINI in the last 72 hours. BNP: Invalid input(s): POCBNP D-Dimer: No results for input(s): DDIMER in the last 72 hours. Hemoglobin A1C: No results for input(s): HGBA1C in the last 72 hours. Fasting Lipid Panel: No results for input(s):  CHOL, HDL, LDLCALC, TRIG, CHOLHDL, LDLDIRECT in the last 72 hours. Thyroid Function Tests: No results for input(s): TSH, T4TOTAL, T3FREE, THYROIDAB in the last 72 hours.  Invalid input(s): FREET3 Anemia Panel:  Recent Labs  10/05/15 1000  TIBC 263  IRON 31*    RADIOLOGY: US Renal  Result Date: 09/27/2015 CLINICAL DATA:  Acute renal failure. EXAM: RENAL / URINARY TRACT ULTRASOUND COMPLETE COMPARISON:  08/07/2015 FINDINGS: Right Kidney: Length: 9.9 cm. Cortical thinning noted with slight increased cortical echogenicity. No hydronephrosis. Left Kidney: Length: 11.5 cm multiple simple appearing cysts are identified, as before. No hydronephrosis. Echogenicity within normal limits. No mass or hydronephrosis visualized. Bladder: Appears normal for degree of bladder distention. Other:  Small volume ascites noted with 2.8 cm hepatic cyst evident. IMPRESSION: No evidence for hydronephrosis. Electronically Signed   By: Kennith Center M.D.   On: 09/27/2015 10:30   Dg Chest Port 1 View  Result Date: 09/30/2015 CLINICAL DATA:  Central line placement  EXAM: PORTABLE CHEST 1 VIEW COMPARISON:  09/25/2015 FINDINGS: Left pacer remains in place, unchanged. Interval placement of left internal jugular central line. The tip projects superiorly into the lateral wall of the SVC or possibly into the lower right innominate vein. No pneumothorax. There is cardiomegaly with vascular congestion. Bibasilar atelectasis and small effusions. IMPRESSION: Left PICC line tip projects superiorly into the lateral wall of the SVC or possibly into the lower portion of the right innominate vein. Cardiomegaly with vascular congestion. Small effusions with bibasilar atelectasis. Electronically Signed   By: Charlett Nose M.D.   On: 09/30/2015 14:10   Dg Chest Port 1 View  Result Date: 09/25/2015 CLINICAL DATA:  82 year old male with shortness of breath. EXAM: PORTABLE CHEST 1 VIEW COMPARISON:  Chest radiograph dated 09/24/2015 FINDINGS: There is stable moderate cardiomegaly with minimal central vascular prominence. There is no focal consolidation, pleural effusion, or pneumothorax. Right apical pleural thickening. Left pectoral AICD device and median sternotomy wires noted. There is osteopenia with degenerative changes of the spine. Bilateral shoulder arthroplasty. No acute fracture. IMPRESSION: Cardiomegaly with probable mild congestive changes. No focal consolidation or edema. Electronically Signed   By: Elgie Collard M.D.   On: 09/25/2015 02:59   Dg Chest Port 1 View  Result Date: 09/24/2015 CLINICAL DATA:  Dyspnea EXAM: PORTABLE CHEST 1 VIEW COMPARISON:  08/12/2015 FINDINGS: Cardiomegaly again noted. Status post median sternotomy. 3 leads cardiac pacemaker is unchanged in position. No infiltrate or pulmonary edema. Bilateral shoulder prosthesis again noted. IMPRESSION: Cardiomegaly. No active disease. Cardiac pacemaker unchanged in position. Electronically Signed   By: Natasha Mead M.D.   On: 09/24/2015 20:24    PHYSICAL EXAM CVP 11-12  General: Elderly and chronically ill  appearing.  Neck: JVP ~10 cm, no thyromegaly or thyroid nodule.  Lungs: Diminished bibasilar sounds, with scant crackles.  CV: Lateral PMI.  Heart sounds distant, regular S1/S2, no S3/S4, no murmur.  2+edema 1/3 to knees bilaterally.   Abdomen: Soft, NT, ND, no HSM. No bruits or masses. +BS  Neurologic: Alert and oriented x 3.  Psych: Normal affect. Extremities: No clubbing or cyanosis.   TELEMETRY: Reviewed personally, NSR with BiV pacing.   ASSESSMENT AND PLAN: 53 with history of CAD s/p CABG, ischemic cardiomyopathy s/p [employer] CRT-D, paroxysmal atrial fibrillation, CKD admitted with VT and ICD discharge.  At [facility], he developed progressive volume overload and renal dysfunction, had VT again with dopamine use.  Transferred to [facility].   1. VT: Appropriate ICD discharge prior to admission.  Had another VT episode in hospital on dopamine, terminated with ATP. Do not think this was triggered by ACS, likely scar-mediated. No chest pain. K and Mag supplemented.   - Continue amiodarone gtt while on low dose milrinone.  2. CAD: s/p CABG.  Had LHC in 7/17 with no options for revascularization.  Would not repeat cath (especially with AKI). Elevated troponin at admission may be demand ischemia with volume overload and defibrillation.  - Continue Plavix, statin.  3. Acute on chronic systolic CHF: EF 02-58%, moderate MR, moderately decreased RV systolic function, moderate TR on 7/17 echo, ischemic cardiomyopathy.  [employer] CRT-D.  RHC in 7/17 showed low cardiac output and elevated filling pressures (PA 55/31, mean PCWP 32, CI 1.6). Remains mildly volume overloaded though creatinine remains significantly elevated.  CVP 11-12 today.  - Continue low dose milrinone for today. Todays CO-OX 67%. Stop milrinone tomorrow if CO-OX ok.  - Give one more dose of IV  Lasix then start torsemide 60 mg twice daily this evening (prior to admit he was on lasix 40 mg po twice daily).     - In absence of renal  recovery, prognosis poor.  - Continue ted hose (not on this am, will re-order) 4. AKI on CKD stage III: Most likely hemodynamic => suspect ATN event in the setting of initial cardiac arrest with hypotension. Renal following (appreciate), not a good long-term HD candidate.  Have been able to avoid CVVH so far.  - Todays Creatinine 4.1. Over 2 liters urine output.  5. Atrial fibrillation: History of PAF, has not been anticoagulated however. On heparin [place] for DVT prophylaxis.  6. Deconditioning- PT following. Recommending SNF.  Consult SW.  7. Hypokalemia- Supplement K  8. Hypomagnesia Mag 1.6 Give 2 grams Mag.   Transfer to stepdown.   Disposition: ~48 hours.   Tonye Becket, NP-C  10/06/2015 8:03 AM  Advanced Heart Failure Team Pager [phone] (M-F; 7a - 4p)  Please contact [facility] for night-coverage after hours (4p -7a ) and weekends on [email or web address]  Patient seen with NP, agree with the above note.  Creatinine has reached a plateau around 4.  He is walking with PT, feeling better overall.  CVP about 11 today.  Will give IV Lasix this morning, transition to torsemide 60 mg po bid this afternoon.  If he continues to do well, will wean off milrinone perhaps tomorrow.   Marca Ancona 10/06/2015 9:40 AM

## 2015-10-06 NOTE — Progress Notes (Signed)
CARDIAC REHAB PHASE I   PRE:  Rate/Rhythm: 79 paced  BP:  Supine: 103/46  Sitting:   Standing:    SaO2: 92%RA  MODE:  Ambulation: 175 ft   POST:  Rate/Rhythm: 92 paced  BP:  Supine:   Sitting: 119/58  Standing:    SaO2: 90-95%RA 0937-1012 Pt walked 175 ft on RA with rollator and asst x 2 with fairly steady gait. Stopped 4 times to rest. Pt did better taking rest stops when tired. Monitored sats whole walk and never below 90%. To recliner with call bell. Family in room.   Luetta Nutting, RN BSN  10/06/2015 10:09 AM

## 2015-10-07 LAB — RENAL FUNCTION PANEL
ANION GAP: 13 (ref 5–15)
Albumin: 2.6 g/dL — ABNORMAL LOW (ref 3.5–5.0)
BUN: 82 mg/dL — ABNORMAL HIGH (ref 6–20)
CHLORIDE: 95 mmol/L — AB (ref 101–111)
CO2: 25 mmol/L (ref 22–32)
Calcium: 8.1 mg/dL — ABNORMAL LOW (ref 8.9–10.3)
Creatinine, Ser: 4.2 mg/dL — ABNORMAL HIGH (ref 0.61–1.24)
GFR, EST AFRICAN AMERICAN: 14 mL/min — AB (ref >60)
GFR, EST NON AFRICAN AMERICAN: 12 mL/min — AB (ref >60)
Glucose, Bld: 98 mg/dL (ref 65–99)
POTASSIUM: 3.9 mmol/L (ref 3.5–5.1)
Phosphorus: 3.7 mg/dL (ref 2.5–4.6)
Sodium: 133 mmol/L — ABNORMAL LOW (ref 135–145)

## 2015-10-07 LAB — MAGNESIUM: Magnesium: 2 mg/dL (ref 1.7–2.4)

## 2015-10-07 LAB — CARBOXYHEMOGLOBIN
Carboxyhemoglobin: 1.8 % — ABNORMAL HIGH (ref 0.5–1.5)
METHEMOGLOBIN: 1.5 % (ref 0.0–1.5)
O2 SAT: 70.4 %
Total hemoglobin: 8.5 g/dL — ABNORMAL LOW (ref 12.0–16.0)

## 2015-10-07 LAB — CBC
HEMATOCRIT: 25 % — AB (ref 39.0–52.0)
Hemoglobin: 8.4 g/dL — ABNORMAL LOW (ref 13.0–17.0)
MCH: 29.2 pg (ref 26.0–34.0)
MCHC: 33.6 g/dL (ref 30.0–36.0)
MCV: 86.8 fL (ref 78.0–100.0)
PLATELETS: 100 10*3/uL — AB (ref 150–400)
RBC: 2.88 MIL/uL — ABNORMAL LOW (ref 4.22–5.81)
RDW: 19 % — AB (ref 11.5–15.5)
WBC: 5.1 10*3/uL (ref 4.0–10.5)

## 2015-10-07 MED ORDER — SALINE SPRAY 0.65 % NA SOLN
1.0000 | NASAL | Status: DC | PRN
  Filled 2015-10-07: qty 44

## 2015-10-07 MED ORDER — FUROSEMIDE 10 MG/ML IJ SOLN
160.0000 mg | Freq: Three times a day (TID) | INTRAVENOUS | Status: DC
  Administered 2015-10-07 – 2015-10-11 (×13): 160 mg via INTRAVENOUS
  Filled 2015-10-07 (×17): qty 16

## 2015-10-07 MED ORDER — WHITE PETROLATUM GEL
Status: DC | PRN
  Administered 2015-10-10: 08:00:00 via TOPICAL
  Filled 2015-10-07 (×2): qty 1

## 2015-10-07 MED ORDER — NITROGLYCERIN 0.4 MG SL SUBL: 0.4000 mg | SUBLINGUAL_TABLET | SUBLINGUAL | Status: DC | PRN

## 2015-10-07 NOTE — Progress Notes (Signed)
Physical Therapy Treatment Patient Details Name: Tom Nguyen MRN: [id number] DOB: Aug 11, 1932 Today's Date: 10/07/2015    History of Present Illness Benjerman Molinelli is an 82 year old male with past medical history of Asthma,CAD, CHF- Class 3,COPD, s/p AICD, Hypertension, Hyperlipidemia, MI, and sleep apnea. Patient presented to [facility] 09/24/15 with chest pain and shortness of breath. In the ED the patient was in V-tach and was defibrillated. 09/30/15 transfer to [facility] for consideration of LVAD. Nephrology following due to acute renal failure and ? will need CRRT (pt not a long-term dialysis candidate).     PT Comments    Able to walk on Room Air and noted O2 sats decr to 87% observed lowest (at that time, sat readingwas likely compromised due to grip on RW); Continue to recommend SNF for rehab to maximize independence and safety with mobility with mobiltiy prior to dc home   Follow Up Recommendations  Supervision/Assistance - 24 hour;SNF     Equipment Recommendations  None recommended by PT    Recommendations for Other Services OT consult     Precautions / Restrictions Precautions Precautions: Fall Precaution Comments: one fall in past 12 months    Mobility  Bed Mobility                  Transfers Overall transfer level: Needs assistance Equipment used: 4-wheeled walker Transfers: Sit to/from Stand Sit to Stand: Mod assist         General transfer comment: Patient required incr assist for anterior weight shift over his feet as well as vertical translation  Ambulation/Gait Ambulation/Gait assistance: Min guard Ambulation Distance (Feet): 115 Feet Assistive device: 4-wheeled walker Gait Pattern/deviations: Step-through pattern;Decreased stride length   Gait velocity interpretation: Below normal speed for age/gender General Gait Details: Cues to self-monitor for activity tolerance; 2 standing rest breaks   Stairs            Wheelchair Mobility    Modified  Rankin (Stroke Patients Only)       Balance             Standing balance-Leahy Scale: Poor                      Cognition Arousal/Alertness: Awake/alert Behavior During Therapy: WFL for tasks assessed/performed Overall Cognitive Status: Within Functional Limits for tasks assessed                      Exercises      General Comments        Pertinent Vitals/Pain Pain Assessment: Faces Pain Score: 2  Faces Pain Scale: Hurts a little bit Pain Location: Buttocks from being in the chair a long time Pain Descriptors / Indicators: Aching Pain Intervention(s): Monitored during session;Repositioned    Home Living                      Prior Function            PT Goals (current goals can now be found in the care plan section) Acute Rehab PT Goals Patient Stated Goal: Return to prior level of function at home PT Goal Formulation: With patient/family Time For Goal Achievement: 10/16/15 Potential to Achieve Goals: Fair Progress towards PT goals: Progressing toward goals    Frequency    Min 3X/week      PT Plan Current plan remains appropriate    Co-evaluation  End of Session Equipment Utilized During Treatment: Gait belt Activity Tolerance: Patient limited by fatigue Patient left: in chair;with call bell/phone within reach     Time: 5456-2563 PT Time Calculation (min) (ACUTE ONLY): 26 min  Charges:  $Gait Training: 23-37 mins                    G Codes:      Olen Pel 10/07/2015, 4:40 PM  Van Clines, [place]  Acute Rehabilitation Services Pager [phone] Office [phone]

## 2015-10-07 NOTE — Progress Notes (Signed)
Patient ID: Tom Nguyen, male   DOB: 04-05-1932, 82 y.o.   MRN: [id number]  [facility] Progress Note    Subjective:    IV lasix 160 TID ->torsemide 60 BID -->changed by cardiology yesterday UOP down - only 1375/24 hours No improvement in renal function Worked with cardiac rehab yesterday  Objective:   BP 115/60 (BP Location: Right Arm)   Pulse 70   Temp 97.5 F (36.4 C) (Oral)   Resp (!) 22   Ht 5\' 8"  (1.727 m)   Wt 79.9 kg (176 lb 2.4 oz)   SpO2 90%   BMI 26.78 kg/m   Intake/Output Summary (Last 24 hours) at 10/07/15 0920 Last data filed at 10/07/15 0900  Gross per 24 hour  Intake            932.5 ml  Output             1250 ml  Net           -317.5 ml    Physical Exam: VS as noted CVP 12 (13 yest) In bed, comfortable, not wearing O2  Regular (paced) rhythm L ICD non tender S1 and S2 normal Breath sounds diminished bilaterally, basilar crackles Soft, obese, nontender abdomen Ext: 1+ pitting upper and 2+  lower extremity edema bilaterally unchanged  Labs:  Recent Labs Lab 10/02/15 0730 10/03/15 0445 10/03/15 1646 10/04/15 0030 10/04/15 0530 10/05/15 0224 10/06/15 0500 10/07/15 0403  NA 132* 135 133*  --  134* 135 137 133*  K 3.2* 3.1* 2.9* 3.4* 3.3* 3.6 3.1* 3.9  CL 98* 96* 96*  --  94* 97* 95* 95*  CO2 22 24 24   --  25 25 27 25   GLUCOSE 89 85 152*  --  101* 121* 86 98  BUN 69* 74* 74*  --  75* 78* 78* 82*  CREATININE 3.91* 3.99* 4.12*  --  4.02* 3.99* 4.07* 4.20*  CALCIUM 7.9* 8.1* 8.0*  --  8.0* 8.1* 8.2* 8.1*  PHOS  --   --   --   --   --   --  3.6 3.7     Recent Labs Lab 10/04/15 0530 10/05/15 0224 10/06/15 0500 10/07/15 0403  WBC 6.1 4.8 5.0 5.1  HGB 8.7* 8.6* 8.3* 8.4*  HCT 25.3* 25.0* 24.0* 25.0*  MCV 86.9 86.5 87.0 86.8  PLT 109* 116* 81* 100*    Medications:    . allopurinol  100 mg Oral Daily  . arformoterol  15 mcg Nebulization BID   And  . umeclidinium bromide  1 puff Inhalation Daily  . carvedilol  3.125  mg Oral BID WC  . clopidogrel  75 mg Oral Daily  . darbepoetin (ARANESP) injection - NON-DIALYSIS  100 mcg Subcutaneous Q Tue-1800  . ferumoxytol  510 mg Intravenous Weekly  . folic acid  1 mg Oral Daily  . heparin  5,000 Units Subcutaneous Q8H  . leflunomide  20 mg Oral Daily  . levothyroxine  75 mcg Oral QAC breakfast  . mouth rinse  15 mL Mouth Rinse BID  . montelukast  10 mg Oral Daily  . pantoprazole  40 mg Oral Daily  . simvastatin  40 mg Oral q1800  . sodium chloride flush  10-40 mL Intracatheter Q12H  . sodium chloride flush  3 mL Intravenous Q12H  . torsemide  60 mg Oral BID   Infusion Medications . amiodarone 30 mg/hr (10/07/15 0900)  . milrinone 0.125 mcg/kg/min (10/07/15 0900)   Background: 82 year old  gentleman transferred from [facility] with bifactorial AKI  d/t chronic cardiorenal syndrome (EF 20-25%) with acute component related to exacerbation of CHF plus possible ischemic ATN after episode pf VT/relative hypotension with renal hypoperfusion injury.  Pt is well aware of his poor candidacy as a long-term dialysis patient and supports not pursuing chronic hemodialysis.   Baseline creatinine (07/2015) appears to be around 1.4-1.5, up to 1.7 on admission to [facility], with peak creatinine since transfer to [facility] 4.22 on 10/07/15.  ([place] 9.9, 11.5 cm kidneys without hydro, UA entirely benign).   Assessment/ Plan:    Acute renal failure on chronic kidney disease stage III:  Dual injury from ischemic ATN from V. tach/relative hypotension and CHF exacerbation with renal hypoperfusion on top of chronic cardiorenal syndrome.  GFR has dropped by > 50% with this current renal injury (creatinine 2.05 w/GFR 29 on 09/25/15-->4.2 with GFR 12 as of today No uremic symptoms He does not want (and is not candidate for) long term HD and it would offer no mortality benefit to him If GFR continues to slowly decline with diuretics and inotropes, we would be looking at keeping comfortable - he and family are  aware of this.  Anemia Hb 8.4 Fe def with tsat 12 Got first of 2 doses of Feraheme 510 IV yesterday, plus Aranesp 100/week (started 9/19)   CAD/prior CABG Elev trop demand isch on adm No repeat cath planned  Combined systolic/diastolic heart failure  EF 25-30% w/MR, TR On milrinone/IV lasix 160 TID had good uop Lasix IV has been changed to po torsemide with decrease in UOP - will likely need higher dose - cardiology is managing and not seen yet by them today  S/p VT  S/p defibrillation and then chemical cardioversion Remains in sinus rhythm on IV amio  Hypokalemia:  K normal today (dosed with total of 80 mEq 9/19)   Camille Bal, MD [facility] [phone] Pager 10/07/2015, 9:20 AM

## 2015-10-07 NOTE — Progress Notes (Signed)
Patient ID: Tom Nguyen, male   DOB: January 08, 1933, 82 y.o.   MRN: [id number]      SUBJECTIVE: Patient transferred from [facility] 9/13.  He was initially admitted to [facility] after VT with ICD discharge, then developed progressive renal dysfunction with volume overload.  Creatinine up to 3.73.  He was started on dopamine 9/12 but had VT in 140s terminated by ATP.    Remains on milrinone 0.125 mcg/kg/min without VT. Coox 70%. Yesterday he was switched to torsemide. CVP trending back up to 15.   Currently in NSR with BiV pacing.   Denies SOB.      Scheduled Meds: . allopurinol  100 mg Oral Daily  . arformoterol  15 mcg Nebulization BID   And  . umeclidinium bromide  1 puff Inhalation Daily  . carvedilol  3.125 mg Oral BID WC  . clopidogrel  75 mg Oral Daily  . darbepoetin (ARANESP) injection - NON-DIALYSIS  100 mcg Subcutaneous Q Tue-1800  . ferumoxytol  510 mg Intravenous Weekly  . folic acid  1 mg Oral Daily  . heparin  5,000 Units Subcutaneous Q8H  . leflunomide  20 mg Oral Daily  . levothyroxine  75 mcg Oral QAC breakfast  . mouth rinse  15 mL Mouth Rinse BID  . montelukast  10 mg Oral Daily  . pantoprazole  40 mg Oral Daily  . simvastatin  40 mg Oral q1800  . sodium chloride flush  10-40 mL Intracatheter Q12H  . sodium chloride flush  3 mL Intravenous Q12H  . torsemide  60 mg Oral BID   Continuous Infusions: . amiodarone 30 mg/hr (10/07/15 0900)  . milrinone 0.125 mcg/kg/min (10/07/15 0900)   PRN Meds:.sodium chloride, acetaminophen, benzocaine, ipratropium-albuterol, nitroGLYCERIN, ondansetron (ZOFRAN) IV, sodium chloride flush, sodium chloride flush    Vitals:   10/06/15 2100 10/06/15 2341 10/07/15 0400 10/07/15 0823  BP: 127/73 (!) 92/54 109/60 115/60  Pulse: 68 66 70   Resp: 18 15 18  (!) 22  Temp:  97.6 F (36.4 C) 97.6 F (36.4 C) 97.5 F (36.4 C)  TempSrc:  Oral Oral Oral  SpO2: 94% 92% 93% 90%  Weight:   176 lb 2.4 oz (79.9 kg)   Height:        Intake/Output  Summary (Last 24 hours) at 10/07/15 1018 Last data filed at 10/07/15 0900  Gross per 24 hour  Intake            932.5 ml  Output             1250 ml  Net           -317.5 ml    LABS: Basic Metabolic Panel:  Recent Labs  10/09/15 0500 10/07/15 0403  NA 137 133*  K 3.1* 3.9  CL 95* 95*  CO2 27 25  GLUCOSE 86 98  BUN 78* 82*  CREATININE 4.07* 4.20*  CALCIUM 8.2* 8.1*  MG 1.6* 2.0  PHOS 3.6 3.7   Liver Function Tests:  Recent Labs  10/06/15 0500 10/07/15 0403  ALBUMIN 2.6* 2.6*   No results for input(s): LIPASE, AMYLASE in the last 72 hours. CBC:  Recent Labs  10/06/15 0500 10/07/15 0403  WBC 5.0 5.1  HGB 8.3* 8.4*  HCT 24.0* 25.0*  MCV 87.0 86.8  PLT 81* 100*   Cardiac Enzymes: No results for input(s): CKTOTAL, CKMB, CKMBINDEX, TROPONINI in the last 72 hours. BNP: Invalid input(s): POCBNP D-Dimer: No results for input(s): DDIMER in the last 72 hours. Hemoglobin A1C: No  results for input(s): HGBA1C in the last 72 hours. Fasting Lipid Panel: No results for input(s): CHOL, HDL, LDLCALC, TRIG, CHOLHDL, LDLDIRECT in the last 72 hours. Thyroid Function Tests: No results for input(s): TSH, T4TOTAL, T3FREE, THYROIDAB in the last 72 hours.  Invalid input(s): FREET3 Anemia Panel:  Recent Labs  10/05/15 1000  TIBC 263  IRON 31*    RADIOLOGY: US Renal  Result Date: 09/27/2015 CLINICAL DATA:  Acute renal failure. EXAM: RENAL / URINARY TRACT ULTRASOUND COMPLETE COMPARISON:  08/07/2015 FINDINGS: Right Kidney: Length: 9.9 cm. Cortical thinning noted with slight increased cortical echogenicity. No hydronephrosis. Left Kidney: Length: 11.5 cm multiple simple appearing cysts are identified, as before. No hydronephrosis. Echogenicity within normal limits. No mass or hydronephrosis visualized. Bladder: Appears normal for degree of bladder distention. Other:  Small volume ascites noted with 2.8 cm hepatic cyst evident. IMPRESSION: No evidence for hydronephrosis.  Electronically Signed   By: Kennith Center M.D.   On: 09/27/2015 10:30   Dg Chest Port 1 View  Result Date: 09/30/2015 CLINICAL DATA:  Central line placement EXAM: PORTABLE CHEST 1 VIEW COMPARISON:  09/25/2015 FINDINGS: Left pacer remains in place, unchanged. Interval placement of left internal jugular central line. The tip projects superiorly into the lateral wall of the SVC or possibly into the lower right innominate vein. No pneumothorax. There is cardiomegaly with vascular congestion. Bibasilar atelectasis and small effusions. IMPRESSION: Left PICC line tip projects superiorly into the lateral wall of the SVC or possibly into the lower portion of the right innominate vein. Cardiomegaly with vascular congestion. Small effusions with bibasilar atelectasis. Electronically Signed   By: Charlett Nose M.D.   On: 09/30/2015 14:10   Dg Chest Port 1 View  Result Date: 09/25/2015 CLINICAL DATA:  82 year old male with shortness of breath. EXAM: PORTABLE CHEST 1 VIEW COMPARISON:  Chest radiograph dated 09/24/2015 FINDINGS: There is stable moderate cardiomegaly with minimal central vascular prominence. There is no focal consolidation, pleural effusion, or pneumothorax. Right apical pleural thickening. Left pectoral AICD device and median sternotomy wires noted. There is osteopenia with degenerative changes of the spine. Bilateral shoulder arthroplasty. No acute fracture. IMPRESSION: Cardiomegaly with probable mild congestive changes. No focal consolidation or edema. Electronically Signed   By: Elgie Collard M.D.   On: 09/25/2015 02:59   Dg Chest Port 1 View  Result Date: 09/24/2015 CLINICAL DATA:  Dyspnea EXAM: PORTABLE CHEST 1 VIEW COMPARISON:  08/12/2015 FINDINGS: Cardiomegaly again noted. Status post median sternotomy. 3 leads cardiac pacemaker is unchanged in position. No infiltrate or pulmonary edema. Bilateral shoulder prosthesis again noted. IMPRESSION: Cardiomegaly. No active disease. Cardiac pacemaker  unchanged in position. Electronically Signed   By: Natasha Mead M.D.   On: 09/24/2015 20:24    PHYSICAL EXAM CVP 15 General: Elderly and chronically ill appearing. In bed.  Neck: JVP ~to jaw cm, no thyromegaly or thyroid nodule.  Lungs: Diminished bibasilar sounds, with scant crackles.  CV: Lateral PMI.  Heart sounds distant, regular S1/S2, no S3/S4, no murmur.  2+edema 1/3 to knees bilaterally.   Abdomen: Soft, NT, ND, no HSM. No bruits or masses. +BS  Neurologic: Alert and oriented x 3.  Psych: Normal affect. Extremities: No clubbing or cyanosis.   TELEMETRY: Reviewed personally, NSR with BiV pacing.   ASSESSMENT AND PLAN: 48 with history of CAD s/p CABG, ischemic cardiomyopathy s/p [employer] CRT-D, paroxysmal atrial fibrillation, CKD admitted with VT and ICD discharge.  At [facility], he developed progressive volume overload and renal dysfunction, had VT again  with dopamine use.  Transferred to [facility].   1. VT: Appropriate ICD discharge prior to admission.  Had another VT episode in hospital on dopamine, terminated with ATP. Do not think this was triggered by ACS, likely scar-mediated. No chest pain. K 3.9 and Mag 2.0 .    - Continue amiodarone gtt while on low dose milrinone.   2. CAD: s/p CABG.  Had LHC in 7/17 with no options for revascularization.  Would not repeat cath (especially with AKI). Elevated troponin at admission may be demand ischemia with volume overload and defibrillation.  - Continue Plavix, statin.  3. Acute on chronic systolic CHF: EF 76-81%, moderate MR, moderately decreased RV systolic function, moderate TR on 7/17 echo, ischemic cardiomyopathy.  [employer] CRT-D.  RHC in 7/17 showed low cardiac output and elevated filling pressures (PA 55/31, mean PCWP 32, CI 1.6). Yesterday he was transitioned torsemide 60 mg twice daily. Today's CVP has gone up to 15. Creatinine trending up. - Stop torsemide and start 160 mg IV lasix every 8 hours.   - In absence of renal  recovery, prognosis poor. Not a candidate for HD.  - Continue ted hose (not on this am, will re-order) 4. AKI on CKD stage III: Most likely hemodynamic => suspect ATN event in the setting of initial cardiac arrest with hypotension. Renal following (appreciate), not a good long-term HD candidate.  Have been able to avoid CVVH so far.  - Today's Creatinine 4.2   5. Atrial fibrillation: History of PAF, has not been anticoagulated however. On heparin [place] for DVT prophylaxis.  6. Deconditioning- PT following. Recommending SNF.  Consult SW.  7. Hypokalemia- Today K, 3.9  8. Hypomagnesia - Mag 2.0    Consult Palliative Care for goals of care.   Tonye Becket, NP-C  10/07/2015 10:18 AM  Advanced Heart Failure Team Pager [phone] (M-F; 7a - 4p)  Please contact [facility] for night-coverage after hours (4p -7a ) and weekends on [email or web address]  Patient seen with NP, agree with the above note.  He did not do well yesterday with po diuretic, CVP up to 15.  Creatinine slightly higher at 4.2  I think his overall prognosis is poor, especially given lack renal function improvement so far.  - Will need to go back to IV Lasix today, 160 mg IV every 8 hrs.  Continue IV milrinone at low dose for now.  - Continue amiodarone. - I am going to have palliative care meet with him today.  I am concerned we are going to have a hard time keeping fluid off him with oral diuretics, and he is a poor HD candidate.   Marca Ancona 10/07/2015 10:58 AM

## 2015-10-07 NOTE — Progress Notes (Signed)
This note also relates to the following rows which could not be included: Pulse Rate - Cannot attach notes to unvalidated device data Resp - Cannot attach notes to unvalidated device data SpO2 - Cannot attach notes to unvalidated device data  Pt has home CPAP and doesn't need help applying. RT will continue to monitor.

## 2015-10-07 NOTE — Progress Notes (Signed)
1505 PT working with pt now. Will continue to follow.Luetta Nutting RN BSN 10/07/2015 3:06 PM '

## 2015-10-08 ENCOUNTER — Other Ambulatory Visit: Payer: Medicare Other

## 2015-10-08 DIAGNOSIS — N179 Acute kidney failure, unspecified: Secondary | ICD-10-CM

## 2015-10-08 DIAGNOSIS — Z7189 Other specified counseling: Secondary | ICD-10-CM

## 2015-10-08 DIAGNOSIS — Z515 Encounter for palliative care: Secondary | ICD-10-CM

## 2015-10-08 LAB — RENAL FUNCTION PANEL
Albumin: 2.6 g/dL — ABNORMAL LOW (ref 3.5–5.0)
Anion gap: 14 (ref 5–15)
BUN: 86 mg/dL — ABNORMAL HIGH (ref 6–20)
CO2: 25 mmol/L (ref 22–32)
Calcium: 8.3 mg/dL — ABNORMAL LOW (ref 8.9–10.3)
Chloride: 94 mmol/L — ABNORMAL LOW (ref 101–111)
Creatinine, Ser: 4.29 mg/dL — ABNORMAL HIGH (ref 0.61–1.24)
GFR calc Af Amer: 14 mL/min — ABNORMAL LOW (ref >60)
GFR calc non Af Amer: 12 mL/min — ABNORMAL LOW (ref >60)
Glucose, Bld: 97 mg/dL (ref 65–99)
Phosphorus: 3.7 mg/dL (ref 2.5–4.6)
Potassium: 3.6 mmol/L (ref 3.5–5.1)
Sodium: 133 mmol/L — ABNORMAL LOW (ref 135–145)

## 2015-10-08 LAB — CBC
HEMATOCRIT: 24.4 % — AB (ref 39.0–52.0)
HEMOGLOBIN: 8.3 g/dL — AB (ref 13.0–17.0)
MCH: 29.2 pg (ref 26.0–34.0)
MCHC: 34 g/dL (ref 30.0–36.0)
MCV: 85.9 fL (ref 78.0–100.0)
PLATELETS: 80 10*3/uL — AB (ref 150–400)
RBC: 2.84 MIL/uL — AB (ref 4.22–5.81)
RDW: 18.7 % — AB (ref 11.5–15.5)
WBC: 5.3 10*3/uL (ref 4.0–10.5)

## 2015-10-08 LAB — CARBOXYHEMOGLOBIN
Carboxyhemoglobin: 1.4 % (ref 0.5–1.5)
METHEMOGLOBIN: 1.8 % — AB (ref 0.0–1.5)
O2 Saturation: 67.8 %
Total hemoglobin: 8.6 g/dL — ABNORMAL LOW (ref 12.0–16.0)

## 2015-10-08 LAB — MAGNESIUM: Magnesium: 1.9 mg/dL (ref 1.7–2.4)

## 2015-10-08 MED ORDER — METOLAZONE 5 MG PO TABS
5.0000 mg | ORAL_TABLET | Freq: Once | ORAL | Status: AC
  Administered 2015-10-08: 5 mg via ORAL
  Filled 2015-10-08: qty 1

## 2015-10-08 NOTE — Progress Notes (Signed)
Patient stated that he would place CPAP on himself when he was ready to go to bed. RT informed patient to call if he needed assistance. RT will continue to monitor as needed.

## 2015-10-08 NOTE — Consult Note (Signed)
Consultation Note Date: 10/08/2015   Patient Name: Tom Nguyen  DOB: 17-May-1932  MRN: [id number]  Age / Sex: 82 y.o., male  PCP: Valerie Roys, DO Referring Physician: Larey Dresser, MD  Reason for Consultation: Establishing goals of care  HPI/Patient Profile: 82 y.o. male  with past medical history of CAD, CABG in 1995, chronic combined CHF with EF of 25-30% with [facility] Scientific Bi-V ICD, LBBB, MI, ischemic cardiomyopathy, hypertension, hyperlipidemia, COPD, chronic respiratory failure, former smoker, and OSA on CPAP admitted on 09/30/2015 from [facility]. He presented to [facility] on 09/24/15 with chest pain, SOB, diaphoresis, and palpitations. Found to be in symptomatic sustained VT and underwent defibrillation. Converted to NSR. Creatinine continue to trend upwards and nephrology was consulted on 09/26/15. AKI secondary to ATN or acute cardiorenal syndrome. Patient continued to have worsening kidney function and volume overload. Patient transferred to [facility] to pursue aggressive treatment options and further evaluation. ECHO from July-Acute on chronic systolic CHF with EF of 62-70%, moderate MR, moderately decreased RV systolic function, moderate TR. Ischemic cardiomyopathy. Patient on lasix TID and continuous milrinone. CVP worsening with poor diuresis. Creatinine continues to trend up. Nephrology consulted-CVVH not recommended and not a long-term HD candidate. Palliative Medicine consultation for goals of care.    Clinical Assessment and Goals of Care: Dr. Rowe Pavy and I met with patient, wife, and daughter at bedside. Patient sitting on side of bed eating. Denies pain or discomfort. Dyspnea at rest. Prior to hospitalization, patient was living at home with wife. He had home health with RN and PT that came various times of the week. Per daughter and wife, patient has been doing well walking with a walker. The PT will  occasionally take him outside for a walk. He has had a good appetite.  Patient tells [place] that he knows his "kidneys are shutting down." Discussed cardiorenal failure and how despite aggressive medications and continuous infusions, kidneys continue to decline. The patient and family seem to have a good understanding that he is not likely a candidate for dialysis or invasive heart interventions that will not change the overall status of his heart. Discussed at this point that we cannot change the condition of his heart or kidneys but can assist with symptom burden and make him comfortable. Patient tells [place] that he "wants to go home." Daughter asked if this could be possible. Discussed home hospice services that could be available to them. Daughter mentions rehab or home health instead of hospice. Told her it is difficult to discuss these options at this time because he still is on high doses of lasix and continuous milrinone and no where near ready for rehab.   Wife became very tearful. States she does not want hospice and not ready for him to go. She has a strong faith that God will help him through this. She tells [place] she would give her own kidney and heart to him if that's what it took to keep him alive. Told her his heart is too weak for a  surgical procedure. Patient is quiet throughout conversation. Daughter asks what are the next steps if medication are unsuccessful. Discussed the importance of valuing his wishes, keeping him comfortable, and providing him support. Briefly mentioned home hospice services, symptom management, and deactivation of ICD. Offered support to patient and family and reassured them that the palliative medicine team will continue to follow up.   NEXT OF Anne Ng   SUMMARY OF RECOMMENDATIONS    Discussed with patient and family concern of worsening heart and kidney failure. They are aware that he has been receiving aggressive treatment that has not had much improvement in his  overall condition thus far.   Briefly discussed home hospice--patient and family need more time to think this through.   PMT will continue discussions regarding goals of care/hospice moving forward.  Will need discussions regarding deactivation of ICD if comfort measures pursued.   We will follow up tomorrow, 9/22  Code Status/Advance Care Planning:  Full code   Symptom Management:   Per attending  Palliative Prophylaxis:   Aspiration and Frequent Pain Assessment  Additional Recommendations (Limitations, Scope, Preferences):  Full Scope Treatment  Psycho-social/Spiritual:   Desire for further Chaplaincy support:no  Additional Recommendations: Caregiving  Support/Resources and Education on Hospice  Prognosis:   Unable to determine-congestive heart failure with EF 25-30, cardiomyopathy, and acute on chronic renal failure-not a dialysis candidate.   Discharge Planning: To Be Determined SNF with rehab vs. Home with hospice     Primary Diagnoses: Present on Admission: . ARF (acute renal failure) ([place]) . Acute on chronic systolic (congestive) heart failure ([place]) . Cardiomyopathy, ischemic . Cardiogenic shock ([place])   I have reviewed the medical record, interviewed the patient and family, and examined the patient. The following aspects are pertinent.  Past Medical History:  Diagnosis Date  . Arthritis   . Asthma   . CAD (coronary artery disease)    a. s/p CABG 1995; b. stress test 2006: inf post lat infarction w/ peri-infarct ischemia, AK inferior wall & HK inferolat wall, EF 28%. unchanged from prior study 07/2002.  Marland Kitchen Chronic combined systolic and diastolic CHF, NYHA class 3 ([place])    a. 2007 EF 40%; b. 10/2008: EF 35% by echo; c. echo 10/16: EF 45-50%, sev inf/post wall HK, conduction abnormality, mild MR, mod dilated LA, PASP 47 mm Hg  . Chronic diarrhea   . Chronic respiratory failure ([place])   . COPD (chronic obstructive pulmonary disease) ([facility])    on 2L o2 at  home  . Emphysema of lung ([place])   . History of GI bleed   . Hyperlipidemia   . Hypertension   . Hypothyroid   . Ischemic cardiomyopathy   . LBBB (left bundle branch block)   . MI (myocardial infarction) ([place]) 1995  . Sleep apnea    a. on CPAP  . Ventricular tachyarrhythmia ([place]) 09/24/2015   Social History   Social History  . Marital status: Married    Spouse name: N/A  . Number of children: N/A  . Years of education: N/A   Social History Main Topics  . Smoking status: Former Smoker    Quit date: 02/15/1993  . Smokeless tobacco: Never Used     Comment: Quit in 1995  . Alcohol use 0.0 - 0.6 oz/week     Comment: occasional  . Drug use: No  . Sexual activity: Not on file   Other Topics Concern  . Not on file   Social History Narrative   Lives at home  with wife, has both cane and a walker.   Mobility limited due to breathing/dyspnea   Family History  Problem Relation Age of Onset  . Hyperlipidemia Father   . Stroke Father   . Hypertension Father   . Kidney disease Father   . Kidney failure Mother     Was on dialysis   Scheduled Meds: . allopurinol  100 mg Oral Daily  . arformoterol  15 mcg Nebulization BID   And  . umeclidinium bromide  1 puff Inhalation Daily  . clopidogrel  75 mg Oral Daily  . darbepoetin (ARANESP) injection - NON-DIALYSIS  100 mcg Subcutaneous Q Tue-1800  . ferumoxytol  510 mg Intravenous Weekly  . folic acid  1 mg Oral Daily  . furosemide  160 mg Intravenous Q8H  . heparin  5,000 Units Subcutaneous Q8H  . leflunomide  20 mg Oral Daily  . levothyroxine  75 mcg Oral QAC breakfast  . mouth rinse  15 mL Mouth Rinse BID  . montelukast  10 mg Oral Daily  . pantoprazole  40 mg Oral Daily  . simvastatin  40 mg Oral q1800  . sodium chloride flush  10-40 mL Intracatheter Q12H  . sodium chloride flush  3 mL Intravenous Q12H   Continuous Infusions: . amiodarone 30 mg/hr (10/08/15 0600)  . milrinone 0.125 mcg/kg/min (10/08/15 0600)   PRN  Meds:.sodium chloride, acetaminophen, benzocaine, ipratropium-albuterol, nitroGLYCERIN, ondansetron (ZOFRAN) IV, sodium chloride, sodium chloride flush, sodium chloride flush, white petrolatum Medications Prior to Admission:  Prior to Admission medications   Medication Sig Start Date End Date Taking? Authorizing Provider  acetaminophen (TYLENOL) 500 MG tablet Take 500 mg by mouth every 6 (six) hours as needed.    Historical Provider, MD  albuterol (PROVENTIL HFA;VENTOLIN HFA) 108 (90 Base) MCG/ACT inhaler Inhale 2 puffs into the lungs every 4 (four) hours as needed for wheezing or shortness of breath. 03/05/15   Flora Lipps, MD  alendronate (FOSAMAX) 70 MG tablet Take 70 mg by mouth once a week.  04/08/15   Historical Provider, MD  allopurinol (ZYLOPRIM) 100 MG tablet Take 100 mg by mouth daily.  08/25/14   Historical Provider, MD  amiodarone (PACERONE) 400 MG tablet Take 1 tablet (400 mg total) by mouth 2 (two) times daily. 09/30/15   Epifanio Lesches, MD  carvedilol (COREG) 3.125 MG tablet Take 1 tablet (3.125 mg total) by mouth 2 (two) times daily with a meal. 09/02/15   Deboraha Sprang, MD  clopidogrel (PLAVIX) 75 MG tablet Take 1 tablet (75 mg total) by mouth daily. 10/01/15   Epifanio Lesches, MD  folic acid (FOLVITE) 1 MG tablet Take 1 mg by mouth daily.  08/25/14   Historical Provider, MD  furosemide (LASIX) 40 MG tablet Take 1 tablet (40 mg total) by mouth 2 (two) times daily. 08/26/15   Minna Merritts, MD  hydroxychloroquine (PLAQUENIL) 200 MG tablet Take 400 mg by mouth daily.  09/26/14   Historical Provider, MD  leflunomide (ARAVA) 20 MG tablet Take 20 mg by mouth daily.  08/25/14   Historical Provider, MD  levothyroxine (SYNTHROID, LEVOTHROID) 75 MCG tablet Take 1 tablet (75 mcg total) by mouth daily before breakfast. 06/12/15   Coral Spikes, DO  montelukast (SINGULAIR) 10 MG tablet Take 10 mg by mouth daily.  01/25/15   Historical Provider, MD  Multiple Vitamins-Minerals (MULTIVITAMIN ADULT PO)  Take 1 tablet by mouth daily.     Historical Provider, MD  nitroGLYCERIN (NITROLINGUAL) 0.4 MG/SPRAY spray Place 1 spray  under the tongue every 5 (five) minutes x 3 doses as needed for chest pain.    Historical Provider, MD  omeprazole (PRILOSEC) 20 MG capsule Take 1 capsule (20 mg total) by mouth daily. 06/17/15   Coral Spikes, DO  simvastatin (ZOCOR) 40 MG tablet Take 1 tablet (40 mg total) by mouth daily. 08/26/15   Minna Merritts, MD  umeclidinium-vilanterol (ANORO ELLIPTA) 62.5-25 MCG/INH AEPB Inhale 1 puff into the lungs daily. 08/26/15   Wilhelmina Mcardle, MD   No Known Allergies Review of Systems  Constitutional: Positive for activity change.  Respiratory: Positive for shortness of breath.   Cardiovascular: Positive for leg swelling.  Neurological: Positive for weakness.    Physical Exam  Constitutional: He is oriented to person, place, and time. He appears well-developed. He is cooperative.  Cardiovascular: Regular rhythm.  Exam reveals distant heart sounds.   Pulmonary/Chest:  Dyspnea at rest. Crackles in bases.  Abdominal: Soft. Bowel sounds are normal.  Musculoskeletal: He exhibits edema (BLE).  Neurological: He is alert and oriented to person, place, and time.  Skin: Skin is warm and dry.  Psychiatric: He has a normal mood and affect. His speech is normal and behavior is normal. Thought content normal. Cognition and memory are normal.  Nursing note and vitals reviewed.   Vital Signs: BP 119/64 (BP Location: Left Arm)   Pulse 78   Temp 97.4 F (36.3 C) (Axillary)   Resp (!) 21   Ht 5' 8"  (1.727 m)   Wt 78.7 kg (173 lb 8 oz)   SpO2 91%   BMI 26.38 kg/m  Pain Assessment: No/denies pain POSS *See Group Information*: 1-Acceptable,Awake and alert Pain Score: 0-No pain   SpO2: SpO2: 91 % O2 Device:SpO2: 91 % O2 Flow Rate: .O2 Flow Rate (L/min): 2 L/min  IO: Intake/output summary:   Intake/Output Summary (Last 24 hours) at 10/08/15 1106 Last data filed at 10/08/15  0600  Gross per 24 hour  Intake          1873.21 ml  Output             1500 ml  Net           373.21 ml    LBM: Last BM Date: 10/07/15 Baseline Weight: Weight: 80.8 kg (178 lb 2.1 oz) Most recent weight: Weight: 78.7 kg (173 lb 8 oz)     Palliative Assessment/Data: PPS 40%   Flowsheet Rows   Flowsheet Row Most Recent Value  Intake Tab  Referral Department  Cardiology  Unit at Time of Referral  Intermediate Care Unit  Palliative Care Primary Diagnosis  Cardiac  Date Notified  10/07/15  Palliative Care Type  New Palliative care  Reason for referral  Clarify Goals of Care  Date of Admission  09/30/15  Date first seen by Palliative Care  10/08/15  # of days Palliative referral response time  1 Day(s)  # of days IP prior to Palliative referral  7  Clinical Assessment  Palliative Performance Scale Score  40%  Psychosocial & Spiritual Assessment  Palliative Care Outcomes  Patient/Family meeting held?  Yes  Who was at the meeting?  daughter and wife  Palliative Care Outcomes  Clarified goals of care, Counseled regarding hospice      Time In: 0815 Time Out: 0935 Time Total: 52mn Greater than 50%  of this time was spent counseling and coordinating care related to the above assessment and plan.  Signed by:  MIhor Dow FNP-C Palliative Medicine Team  Phone: [phone] Fax: [phone]   Addendum: Patient seen and examined Detailed initial palliative encounter today with patient, family-wife and daughter who is a Immunologist.  Suspect transition towards comfort as singular goal soon We will follow up in am Agree with above note, addendums made by me  Loistine Chance MD [facility] health palliative team [phone]  Please contact Palliative Medicine Team phone at [phone] for questions and concerns.  For individual provider: See Shea Evans

## 2015-10-08 NOTE — Progress Notes (Signed)
Patient ID: Tom Nguyen, male   DOB: 02-23-1932, 82 y.o.   MRN: [id number]      SUBJECTIVE: Patient transferred from [facility] 9/13.  He was initially admitted to [facility] after VT with ICD discharge, then developed progressive renal dysfunction with volume overload.  Creatinine up to 3.73.  He was started on dopamine 9/12 but had VT in 140s terminated by ATP.    Remains on milrinone 0.125 mcg/kg/min without VT. Coox 68%. Yesterday he was switched back to IV lasix. CVP continues to trend up, 17 today.   Currently in NSR with BiV pacing.   Mild dyspnea at rest.     Scheduled Meds: . allopurinol  100 mg Oral Daily  . arformoterol  15 mcg Nebulization BID   And  . umeclidinium bromide  1 puff Inhalation Daily  . carvedilol  3.125 mg Oral BID WC  . clopidogrel  75 mg Oral Daily  . darbepoetin (ARANESP) injection - NON-DIALYSIS  100 mcg Subcutaneous Q Tue-1800  . ferumoxytol  510 mg Intravenous Weekly  . folic acid  1 mg Oral Daily  . furosemide  160 mg Intravenous Q8H  . heparin  5,000 Units Subcutaneous Q8H  . leflunomide  20 mg Oral Daily  . levothyroxine  75 mcg Oral QAC breakfast  . mouth rinse  15 mL Mouth Rinse BID  . montelukast  10 mg Oral Daily  . pantoprazole  40 mg Oral Daily  . simvastatin  40 mg Oral q1800  . sodium chloride flush  10-40 mL Intracatheter Q12H  . sodium chloride flush  3 mL Intravenous Q12H   Continuous Infusions: . amiodarone 30 mg/hr (10/08/15 0600)  . milrinone 0.125 mcg/kg/min (10/08/15 0600)   PRN Meds:.sodium chloride, acetaminophen, benzocaine, ipratropium-albuterol, nitroGLYCERIN, ondansetron (ZOFRAN) IV, sodium chloride, sodium chloride flush, sodium chloride flush, white petrolatum    Vitals:   10/07/15 2035 10/07/15 2036 10/08/15 0024 10/08/15 0431  BP:   (!) 102/50 107/66  Pulse:  69 72 76  Resp:  14 (!) 23 19  Temp:   97.5 F (36.4 C) 97.8 F (36.6 C)  TempSrc:   Oral Oral  SpO2: 91% 92% 92% 90%  Weight:    173 lb 8 oz (78.7 kg)    Height:        Intake/Output Summary (Last 24 hours) at 10/08/15 0812 Last data filed at 10/08/15 0600  Gross per 24 hour  Intake          1952.91 ml  Output             1500 ml  Net           452.91 ml    LABS: Basic Metabolic Panel:  Recent Labs  65/68/12 0403 10/08/15 0431  NA 133* 133*  K 3.9 3.6  CL 95* 94*  CO2 25 25  GLUCOSE 98 97  BUN 82* 86*  CREATININE 4.20* 4.29*  CALCIUM 8.1* 8.3*  MG 2.0 1.9  PHOS 3.7 3.7   Liver Function Tests:  Recent Labs  10/07/15 0403 10/08/15 0431  ALBUMIN 2.6* 2.6*   No results for input(s): LIPASE, AMYLASE in the last 72 hours. CBC:  Recent Labs  10/07/15 0403 10/08/15 0431  WBC 5.1 5.3  HGB 8.4* 8.3*  HCT 25.0* 24.4*  MCV 86.8 85.9  PLT 100* 80*   Cardiac Enzymes: No results for input(s): CKTOTAL, CKMB, CKMBINDEX, TROPONINI in the last 72 hours. BNP: Invalid input(s): POCBNP D-Dimer: No results for input(s): DDIMER in the last 72  hours. Hemoglobin A1C: No results for input(s): HGBA1C in the last 72 hours. Fasting Lipid Panel: No results for input(s): CHOL, HDL, LDLCALC, TRIG, CHOLHDL, LDLDIRECT in the last 72 hours. Thyroid Function Tests: No results for input(s): TSH, T4TOTAL, T3FREE, THYROIDAB in the last 72 hours.  Invalid input(s): FREET3 Anemia Panel:  Recent Labs  10/05/15 1000  TIBC 263  IRON 31*    RADIOLOGY: US Renal  Result Date: 09/27/2015 CLINICAL DATA:  Acute renal failure. EXAM: RENAL / URINARY TRACT ULTRASOUND COMPLETE COMPARISON:  08/07/2015 FINDINGS: Right Kidney: Length: 9.9 cm. Cortical thinning noted with slight increased cortical echogenicity. No hydronephrosis. Left Kidney: Length: 11.5 cm multiple simple appearing cysts are identified, as before. No hydronephrosis. Echogenicity within normal limits. No mass or hydronephrosis visualized. Bladder: Appears normal for degree of bladder distention. Other:  Small volume ascites noted with 2.8 cm hepatic cyst evident. IMPRESSION: No  evidence for hydronephrosis. Electronically Signed   By: Kennith Center M.D.   On: 09/27/2015 10:30   Dg Chest Port 1 View  Result Date: 09/30/2015 CLINICAL DATA:  Central line placement EXAM: PORTABLE CHEST 1 VIEW COMPARISON:  09/25/2015 FINDINGS: Left pacer remains in place, unchanged. Interval placement of left internal jugular central line. The tip projects superiorly into the lateral wall of the SVC or possibly into the lower right innominate vein. No pneumothorax. There is cardiomegaly with vascular congestion. Bibasilar atelectasis and small effusions. IMPRESSION: Left PICC line tip projects superiorly into the lateral wall of the SVC or possibly into the lower portion of the right innominate vein. Cardiomegaly with vascular congestion. Small effusions with bibasilar atelectasis. Electronically Signed   By: Charlett Nose M.D.   On: 09/30/2015 14:10   Dg Chest Port 1 View  Result Date: 09/25/2015 CLINICAL DATA:  82 year old male with shortness of breath. EXAM: PORTABLE CHEST 1 VIEW COMPARISON:  Chest radiograph dated 09/24/2015 FINDINGS: There is stable moderate cardiomegaly with minimal central vascular prominence. There is no focal consolidation, pleural effusion, or pneumothorax. Right apical pleural thickening. Left pectoral AICD device and median sternotomy wires noted. There is osteopenia with degenerative changes of the spine. Bilateral shoulder arthroplasty. No acute fracture. IMPRESSION: Cardiomegaly with probable mild congestive changes. No focal consolidation or edema. Electronically Signed   By: Elgie Collard M.D.   On: 09/25/2015 02:59   Dg Chest Port 1 View  Result Date: 09/24/2015 CLINICAL DATA:  Dyspnea EXAM: PORTABLE CHEST 1 VIEW COMPARISON:  08/12/2015 FINDINGS: Cardiomegaly again noted. Status post median sternotomy. 3 leads cardiac pacemaker is unchanged in position. No infiltrate or pulmonary edema. Bilateral shoulder prosthesis again noted. IMPRESSION: Cardiomegaly. No active  disease. Cardiac pacemaker unchanged in position. Electronically Signed   By: Natasha Mead M.D.   On: 09/24/2015 20:24    PHYSICAL EXAM CVP 17 General: Elderly and chronically ill appearing. In bed.  Neck: JVP to earaw cm, no thyromegaly or thyroid nodule.  Lungs: Diminished bibasilar sounds, with scant crackles.  CV: Lateral PMI.  Heart sounds distant, regular S1/S2, no S3/S4, no murmur.  2+edema 1/3 to knees bilaterally.  Ted hose in place Abdomen: Soft, NT, ND, no HSM. No bruits or masses. +BS  Neurologic: Alert and oriented x 3.  Psych: Normal affect. Extremities: No clubbing or cyanosis.   TELEMETRY: Reviewed personally, NSR with BiV pacing.   ASSESSMENT AND PLAN: 89 with history of CAD s/p CABG, ischemic cardiomyopathy s/p [employer] CRT-D, paroxysmal atrial fibrillation, CKD admitted with VT and ICD discharge.  At [facility], he developed progressive volume  overload and renal dysfunction, had VT again with dopamine use.  Transferred to [facility].   1. VT: Appropriate ICD discharge prior to admission.  Had another VT episode in hospital on dopamine, terminated with ATP. Do not think this was triggered by ACS, likely scar-mediated. No chest pain. K 3.6 and Mag 1.9 .    - Continue amiodarone gtt while on low dose milrinone.   2. CAD: s/p CABG.  Had LHC in 7/17 with no options for revascularization.  Would not repeat cath (especially with AKI). Elevated troponin at admission may be demand ischemia with volume overload and defibrillation.  - Continue Plavix, statin.  3. Acute on chronic systolic CHF: EF 01-75%, moderate MR, moderately decreased RV systolic function, moderate TR on 7/17 echo, ischemic cardiomyopathy.  [employer] CRT-D.  RHC in 7/17 showed low cardiac output and elevated filling pressures (PA 55/31, mean PCWP 32, CI 1.6). Yesterday he was transitioned form torsemide back to IV lasix.  Today's CVP has gone up from 15>17. Poor diuresis noted. Creatinine trending up. - Continue  160 mg IV lasix three times a day and give 5 mg metolazone today.   Continue milrinone for now. He will not be discharged on milrinone .   - In absence of renal recovery, prognosis poor. Not a candidate for HD.  - Continue ted hose (not on this am, will re-order) 4. AKI on CKD stage III: Most likely hemodynamic => suspect ATN event in the setting of initial cardiac arrest with hypotension. Renal following (appreciate), not a good long-term HD candidate.  Have been able to avoid CVVH so far.  - Today's Creatinine trending up-->4.2>4.29  5. Atrial fibrillation: History of PAF, has not been anticoagulated however. On heparin [place] for DVT prophylaxis.  6. Deconditioning- PT following. Recommending SNF.  Consult SW.  7. Hypokalemia- Today K, 3.6   8. Hypomagnesia - Mag 1.9   Palliative Care for goals of care. Suspect will need to transition to Hospice.   Tonye Becket, NP-C  10/08/2015 8:12 AM  Advanced Heart Failure Team Pager [phone] (M-F; 7a - 4p)  Please contact [facility] for night-coverage after hours (4p -7a ) and weekends on [email or web address]  Patient seen with NP, agree with the above note.  Creatinine continues to slowly trend up as does CVP.  Will continue current IV Lasix and add metolazone 5 mg x 1 today.  Our options are limited at this point, think we are not going to be able to keep volume controlled long-term in absence of HD, which he is a poor candidate for.   Palliative care spoke with family today.  They are tearful and thinking about things this morning.  I think that a transition over to comfort care in the near future will be appropriate.   Marca Ancona 10/08/2015 10:16 AM

## 2015-10-08 NOTE — Progress Notes (Signed)
Patient ID: Tom Nguyen, male   DOB: [birth year], 82 y.o.   MRN: [id number]  [facility] Progress Note    Subjective:    Palliative Care was just in with patient Family tearful - and pt says "I don't think I can take much more"  Due to poor response to po diuretics was changed back to IV lasix 160 TID yesterday  and po metolazone 5 mg starting today UOP 1500/24 hours   Objective:   BP 119/64 (BP Location: Left Arm)   Pulse 78   Temp 97.4 F (36.3 C) (Axillary)   Resp (!) 21   Ht 5\' 8"  (1.727 m)   Wt 78.7 kg (173 lb 8 oz)   SpO2 91%   BMI 26.38 kg/m   Intake/Output Summary (Last 24 hours) at 10/08/15 0952 Last data filed at 10/08/15 0600  Gross per 24 hour  Intake          1913.21 ml  Output             1500 ml  Net           413.21 ml    Physical Exam: VS as noted CVP 19 today Depressed, sad "I don't know how much more of this I can take" Regular (paced) rhythm L ICD non tender S1 and S2 normal Breath sounds diminished bilaterally, basilar crackles Soft, obese, nontender abdomen Ext: 1+ pitting upper and 2+  lower extremity edema bilaterally unchanged Yellow urine in foley  Labs:  Recent Labs Lab 10/03/15 0445 10/03/15 1646 10/04/15 0030 10/04/15 0530 10/05/15 0224 10/06/15 0500 10/07/15 0403 10/08/15 0431  NA 135 133*  --  134* 135 137 133* 133*  K 3.1* 2.9* 3.4* 3.3* 3.6 3.1* 3.9 3.6  CL 96* 96*  --  94* 97* 95* 95* 94*  CO2 24 24  --  25 25 27 25 25   GLUCOSE 85 152*  --  101* 121* 86 98 97  BUN 74* 74*  --  75* 78* 78* 82* 86*  CREATININE 3.99* 4.12*  --  4.02* 3.99* 4.07* 4.20* 4.29*  CALCIUM 8.1* 8.0*  --  8.0* 8.1* 8.2* 8.1* 8.3*  PHOS  --   --   --   --   --  3.6 3.7 3.7     Recent Labs Lab 10/05/15 0224 10/06/15 0500 10/07/15 0403 10/08/15 0431  WBC 4.8 5.0 5.1 5.3  HGB 8.6* 8.3* 8.4* 8.3*  HCT 25.0* 24.0* 25.0* 24.4*  MCV 86.5 87.0 86.8 85.9  PLT 116* 81* 100* 80*    Medications:    . allopurinol  100 mg Oral  Daily  . arformoterol  15 mcg Nebulization BID   And  . umeclidinium bromide  1 puff Inhalation Daily  . clopidogrel  75 mg Oral Daily  . darbepoetin (ARANESP) injection - NON-DIALYSIS  100 mcg Subcutaneous Q Tue-1800  . ferumoxytol  510 mg Intravenous Weekly  . folic acid  1 mg Oral Daily  . furosemide  160 mg Intravenous Q8H  . heparin  5,000 Units Subcutaneous Q8H  . leflunomide  20 mg Oral Daily  . levothyroxine  75 mcg Oral QAC breakfast  . mouth rinse  15 mL Mouth Rinse BID  . metolazone  5 mg Oral Once  . montelukast  10 mg Oral Daily  . pantoprazole  40 mg Oral Daily  . simvastatin  40 mg Oral q1800  . sodium chloride flush  10-40 mL Intracatheter Q12H  . sodium chloride flush  3 mL Intravenous Q12H   Infusion Medications . amiodarone 30 mg/hr (10/08/15 0600)  . milrinone 0.125 mcg/kg/min (10/08/15 0600)   Background: 82 year old gentleman transferred from [facility] with bifactorial AKI  d/t chronic cardiorenal syndrome (EF 20-25%) with acute component related to exacerbation of CHF plus possible ischemic ATN after episode pf VT/relative hypotension with renal hypoperfusion injury.  Pt is well aware of his poor candidacy as a long-term dialysis patient and supports not pursuing chronic hemodialysis.   Baseline creatinine (07/2015) appears to be around 1.4-1.5, up to 1.7 on admission to [facility], with peak creatinine since transfer to [facility] 4.22 on 10/07/15.  ([place] 9.9, 11.5 cm kidneys without hydro, UA entirely benign).   Assessment/ Plan:    Acute renal failure on chronic kidney disease stage III:  Dual injury from ischemic ATN from V. tach/relative hypotension and CHF exacerbation with renal hypoperfusion on top of chronic cardiorenal syndrome.  GFR has dropped by > 50% with this current renal injury (creatinine 2.05 w/GFR 29 on 09/25/15-->4.29 with GFR 12 as of today No uremic symptoms but is symptomatic from volume overload and diuretics have been bumped back up He does not want (and is  not candidate for) long term HD and it would offer no mortality benefit to him. I think we are very close to comfort care.  Anemia Hb 8.4 Fe def with tsat 12 Got first of 2 doses of Feraheme 510 IV yesterday, plus Aranesp 100/week (started 9/19)   CAD/prior CABG Elev trop demand isch on adm No repeat cath planned  Combined systolic/diastolic heart failure  EF 25-30% w/MR, TR On milrinone Back on IV lasix and po metolazine  S/p VT  S/p defibrillation and then chemical cardioversion Remains in sinus rhythm on IV amio  Hypokalemia:  K normal today (dosed with total of 80 mEq 9/19)   Camille Bal, MD [facility] [phone] Pager 10/08/2015, 9:52 AM

## 2015-10-09 ENCOUNTER — Ambulatory Visit: Payer: Medicare Other | Admitting: Cardiovascular Disease

## 2015-10-09 LAB — CBC
HCT: 23.4 % — ABNORMAL LOW (ref 39.0–52.0)
HEMOGLOBIN: 7.9 g/dL — AB (ref 13.0–17.0)
MCH: 29 pg (ref 26.0–34.0)
MCHC: 33.8 g/dL (ref 30.0–36.0)
MCV: 86 fL (ref 78.0–100.0)
Platelets: 93 10*3/uL — ABNORMAL LOW (ref 150–400)
RBC: 2.72 MIL/uL — AB (ref 4.22–5.81)
RDW: 18.8 % — ABNORMAL HIGH (ref 11.5–15.5)
WBC: 6 10*3/uL (ref 4.0–10.5)

## 2015-10-09 LAB — CARBOXYHEMOGLOBIN
Carboxyhemoglobin: 1.4 % (ref 0.5–1.5)
METHEMOGLOBIN: 1.3 % (ref 0.0–1.5)
O2 Saturation: 91.4 %
TOTAL HEMOGLOBIN: 8 g/dL — AB (ref 12.0–16.0)

## 2015-10-09 LAB — RENAL FUNCTION PANEL
ALBUMIN: 2.7 g/dL — AB (ref 3.5–5.0)
Anion gap: 14 (ref 5–15)
BUN: 93 mg/dL — AB (ref 6–20)
CO2: 24 mmol/L (ref 22–32)
Calcium: 8.2 mg/dL — ABNORMAL LOW (ref 8.9–10.3)
Chloride: 92 mmol/L — ABNORMAL LOW (ref 101–111)
Creatinine, Ser: 4.73 mg/dL — ABNORMAL HIGH (ref 0.61–1.24)
GFR calc Af Amer: 12 mL/min — ABNORMAL LOW (ref >60)
GFR, EST NON AFRICAN AMERICAN: 11 mL/min — AB (ref >60)
Glucose, Bld: 90 mg/dL (ref 65–99)
PHOSPHORUS: 3.8 mg/dL (ref 2.5–4.6)
POTASSIUM: 3.5 mmol/L (ref 3.5–5.1)
Sodium: 130 mmol/L — ABNORMAL LOW (ref 135–145)

## 2015-10-09 LAB — MAGNESIUM: Magnesium: 2 mg/dL (ref 1.7–2.4)

## 2015-10-09 MED ORDER — SODIUM CHLORIDE 0.9 % IV SOLN: INTRAVENOUS | Status: DC

## 2015-10-09 MED ORDER — ACETAMINOPHEN 325 MG PO TABS: 650.0000 mg | ORAL_TABLET | Freq: Four times a day (QID) | ORAL | Status: DC | PRN

## 2015-10-09 MED ORDER — FENTANYL CITRATE (PF) 100 MCG/2ML IJ SOLN: 25.0000 ug | INTRAMUSCULAR | Status: DC | PRN

## 2015-10-09 MED ORDER — GLYCOPYRROLATE 0.2 MG/ML IJ SOLN
0.2000 mg | INTRAMUSCULAR | Status: DC | PRN
Start: 2015-10-09 — End: 2015-10-11
  Filled 2015-10-09: qty 1

## 2015-10-09 MED ORDER — SODIUM CHLORIDE 0.9 % IV SOLN
25.0000 ug/h | INTRAVENOUS | Status: DC
  Administered 2015-10-09: 25 ug/h via INTRAVENOUS
  Filled 2015-10-09: qty 50

## 2015-10-09 MED ORDER — GLYCOPYRROLATE 1 MG PO TABS
1.0000 mg | ORAL_TABLET | ORAL | Status: DC | PRN
  Filled 2015-10-09: qty 1

## 2015-10-09 MED ORDER — BIOTENE DRY MOUTH MT LIQD: 15.0000 mL | OROMUCOSAL | Status: DC | PRN

## 2015-10-09 MED ORDER — GLYCOPYRROLATE 0.2 MG/ML IJ SOLN
0.2000 mg | INTRAMUSCULAR | Status: DC | PRN
  Filled 2015-10-09: qty 1

## 2015-10-09 MED ORDER — MORPHINE SULFATE (PF) 2 MG/ML IV SOLN
1.0000 mg | INTRAVENOUS | Status: DC | PRN
  Administered 2015-10-09: 2 mg via INTRAVENOUS
  Filled 2015-10-09: qty 1

## 2015-10-09 MED ORDER — ONDANSETRON HCL 4 MG/2ML IJ SOLN: 4.0000 mg | Freq: Four times a day (QID) | INTRAMUSCULAR | Status: DC | PRN

## 2015-10-09 MED ORDER — LORAZEPAM 2 MG/ML PO CONC: 1.0000 mg | ORAL | Status: DC | PRN

## 2015-10-09 MED ORDER — ONDANSETRON 4 MG PO TBDP: 4.0000 mg | ORAL_TABLET | Freq: Four times a day (QID) | ORAL | Status: DC | PRN

## 2015-10-09 MED ORDER — POLYVINYL ALCOHOL 1.4 % OP SOLN
1.0000 [drp] | Freq: Four times a day (QID) | OPHTHALMIC | Status: DC | PRN
  Filled 2015-10-09: qty 15

## 2015-10-09 MED ORDER — LORAZEPAM 2 MG/ML IJ SOLN
1.0000 mg | INTRAMUSCULAR | Status: DC | PRN
  Filled 2015-10-09: qty 1

## 2015-10-09 MED ORDER — HALOPERIDOL LACTATE 2 MG/ML PO CONC
0.5000 mg | ORAL | Status: DC | PRN
  Filled 2015-10-09: qty 0.3

## 2015-10-09 MED ORDER — BISACODYL 10 MG RE SUPP: 10.0000 mg | Freq: Every day | RECTAL | Status: DC | PRN

## 2015-10-09 MED ORDER — ACETAMINOPHEN 650 MG RE SUPP: 650.0000 mg | Freq: Four times a day (QID) | RECTAL | Status: DC | PRN

## 2015-10-09 MED ORDER — LORAZEPAM 1 MG PO TABS: 1.0000 mg | ORAL_TABLET | ORAL | Status: DC | PRN

## 2015-10-09 MED ORDER — FENTANYL BOLUS VIA INFUSION
25.0000 ug | INTRAVENOUS | Status: DC | PRN
  Filled 2015-10-09: qty 25

## 2015-10-09 MED ORDER — HALOPERIDOL 1 MG PO TABS
0.5000 mg | ORAL_TABLET | ORAL | Status: DC | PRN
  Filled 2015-10-09: qty 1

## 2015-10-09 MED ORDER — HALOPERIDOL LACTATE 5 MG/ML IJ SOLN: 0.5000 mg | INTRAMUSCULAR | Status: DC | PRN

## 2015-10-09 NOTE — Progress Notes (Signed)
Physical Therapy Treatment Patient Details Name: Tom Nguyen MRN: [id number] DOB: 1932-06-28 Today's Date: 10/09/2015    History of Present Illness Caedin Mogan is an 82 year old male with past medical history of Asthma,CAD, CHF- Class 3,COPD, s/p AICD, Hypertension, Hyperlipidemia, MI, and sleep apnea. Patient presented to [facility] 09/24/15 with chest pain and shortness of breath. In the ED the patient was in V-tach and was defibrillated. 09/30/15 transfer to [facility] for consideration of LVAD. Nephrology following due to acute renal failure and ? will need CRRT (pt not a long-term dialysis candidate). 9/21 Palliative Consult    PT Comments    Patient with poor respiratory status (per RN, he desaturated to 60's on 6L [place] and required NRB @ 100% to improve his oxygenation). On arrival with RR 27, however pt uncomfortable in the chair and wanted to get back to bed. Placed pt back on NRB for transfer to bed with RR 38 and SaO2 99-100%. Pt unable to tolerate any further activity and positioned comfortably on his side.   (During transfer, pt's IJ line did get "tugged" due to positioning. Dressing appeared intact with no bleeding. RN made aware and to assess).    Follow Up Recommendations  Supervision/Assistance - 24 hour (currently not able to tolerate intensity; noted ?hospice)     Equipment Recommendations  None recommended by PT    Recommendations for Other Services       Precautions / Restrictions Precautions Precautions: Fall Precaution Comments: one fall in past 12 months    Mobility  Bed Mobility Overal bed mobility: Needs Assistance;+ 2 for safety/equipment Bed Mobility: Sit to Sidelying;Rolling Rolling: Min assist       Sit to sidelying: Min assist General bed mobility comments: vc for technique with assist to raise legs  Transfers Overall transfer level: Needs assistance Equipment used: 2 person hand held assist Transfers: Sit to/from [employer] Transfers Sit to  Stand: Min assist;+2 physical assistance;+2 safety/equipment Stand pivot transfers: Min assist;+2 physical assistance;+2 safety/equipment       General transfer comment: pt agreed to not use RW so we could be closer to provide support (if needed)  Ambulation/Gait             General Gait Details: deferred due to respiratory status   Stairs            Wheelchair Mobility    Modified Rankin (Stroke Patients Only)       Balance             Standing balance-Leahy Scale: Poor                      Cognition Arousal/Alertness: Awake/alert Behavior During Therapy: Anxious Overall Cognitive Status: Within Functional Limits for tasks assessed                      Exercises      General Comments General comments (skin integrity, edema, etc.): Family all present. Daughter (nurse anesthetist) encouraging pt to try to walk. Explained that patient desaturated significantly this morning with nursing and did not feel ambulation was appropriate. Pt did want to get back to bed.      Pertinent Vitals/Pain Pain Assessment: Faces Faces Pain Scale: Hurts little more Pain Location: bottom Pain Descriptors / Indicators: Aching Pain Intervention(s): Repositioned    Home Living                      Prior Function  PT Goals (current goals can now be found in the care plan section) Acute Rehab PT Goals Patient Stated Goal: Return to prior level of function at home PT Goal Formulation: With patient/family Time For Goal Achievement: 10/16/15 Potential to Achieve Goals: Fair Progress towards PT goals: Not progressing toward goals - comment    Frequency    Min 3X/week      PT Plan Discharge plan needs to be updated    Co-evaluation             End of Session Equipment Utilized During Treatment: Gait belt;Oxygen (switched to NRB during transfer) Activity Tolerance: Patient limited by fatigue;Treatment limited secondary to  medical complications (Comment) (respiratory status (RR 37)) Patient left: with call bell/phone within reach;in bed;with family/visitor present     Time: 1220-1239 PT Time Calculation (min) (ACUTE ONLY): 19 min  Charges:  $Therapeutic Activity: 8-22 mins                    G Codes:      Raevyn Sokol 11/05/15, 1:12 PM  Pager [phone]

## 2015-10-09 NOTE — Progress Notes (Signed)
   10/09/15 0833 10/09/15 0834 10/09/15 0835  Vitals  Pulse Rate 82 82 81  ECG Heart Rate 83 82 82  Resp (!) 27 (!) 21 (!) 21  Oxygen Therapy  SpO2 97 % (!) 82 % (!) 67 %  O2 Device Nasal Cannula Nasal Cannula Non-rebreather Mask  O2 Flow Rate (L/min) 4 L/min 6 L/min 15 L/min  FiO2 (%) --  --  100 %  Pulse Oximetry Type --  Continuous --   Oximetry Probe Site Changed --  --  Yes     10/09/15 0836 10/09/15 0837 10/09/15 0838  Vitals  Pulse Rate 80 80 78  ECG Heart Rate 81 80 79  Resp 19 (!) 22 17  Oxygen Therapy  SpO2 (!) 65 % (!) 89 % 100 %  O2 Device --  --  --   O2 Flow Rate (L/min) --  --  --   FiO2 (%) --  --  --   Pulse Oximetry Type --  --  --   Oximetry Probe Site Changed --  --  --      10/09/15 0833 10/09/15 0834 10/09/15 0835  Vitals  Pulse Rate 82 82 81  ECG Heart Rate 83 82 82  Resp (!) 27 (!) 21 (!) 21  Oxygen Therapy  SpO2 97 % (!) 82 % (!) 67 %  O2 Device Nasal Cannula Nasal Cannula Non-rebreather Mask  O2 Flow Rate (L/min) 4 L/min 6 L/min 15 L/min  FiO2 (%) --  --  100 %  Pulse Oximetry Type --  Continuous --   Oximetry Probe Site Changed --  --  Yes     10/09/15 0836 10/09/15 0837 10/09/15 0838  Vitals  Pulse Rate 80 80 78  ECG Heart Rate 81 80 79  Resp 19 (!) 22 17  Oxygen Therapy  SpO2 (!) 65 % (!) 89 % 100 %  O2 Device --  --  --   O2 Flow Rate (L/min) --  --  --   FiO2 (%) --  --  --   Pulse Oximetry Type --  --  --   Oximetry Probe Site Changed --  --  --   pt desaturated while ambulating from bed to chair.  Oxygen increased via nasal cannula, then placed on non-rebreather.  Pt recovered after several minutes of 100% oxygen pt saturation recovered, and nasal cannula replaced.   Amy Clegg at bedside.

## 2015-10-09 NOTE — Progress Notes (Signed)
Daily Progress Note   Patient Name: Tom Nguyen       Date: 10/09/2015 DOB: [birth year]  Age: 82 y.o. MRN#: [id number] Attending Physician: Larey Dresser, MD Primary Care Physician: Park Liter, DO Admit Date: 09/30/2015  Reason for Consultation/Follow-up: Establishing goals of care  Subjective:  patient is resting in bed, he appears to be in mild to moderate generalized distress.  Family states that he has voiced fear of dying.  See family meeting note below.   Length of Stay: 9  Current Medications: Scheduled Meds:  . allopurinol  100 mg Oral Daily  . arformoterol  15 mcg Nebulization BID   And  . umeclidinium bromide  1 puff Inhalation Daily  . clopidogrel  75 mg Oral Daily  . darbepoetin (ARANESP) injection - NON-DIALYSIS  100 mcg Subcutaneous Q Tue-1800  . ferumoxytol  510 mg Intravenous Weekly  . folic acid  1 mg Oral Daily  . furosemide  160 mg Intravenous Q8H  . heparin  5,000 Units Subcutaneous Q8H  . leflunomide  20 mg Oral Daily  . levothyroxine  75 mcg Oral QAC breakfast  . mouth rinse  15 mL Mouth Rinse BID  . montelukast  10 mg Oral Daily  . pantoprazole  40 mg Oral Daily  . simvastatin  40 mg Oral q1800  . sodium chloride flush  10-40 mL Intracatheter Q12H  . sodium chloride flush  3 mL Intravenous Q12H    Continuous Infusions: . sodium chloride    . amiodarone 30 mg/hr (10/09/15 1500)  . milrinone 0.125 mcg/kg/min (10/09/15 1500)    PRN Meds: sodium chloride, acetaminophen **OR** acetaminophen, acetaminophen, antiseptic oral rinse, benzocaine, bisacodyl, fentaNYL (SUBLIMAZE) injection, glycopyrrolate **OR** glycopyrrolate **OR** glycopyrrolate, haloperidol **OR** haloperidol **OR** haloperidol lactate, ipratropium-albuterol, LORazepam **OR** LORazepam  **OR** LORazepam, morphine injection, nitroGLYCERIN, ondansetron (ZOFRAN) IV, ondansetron **OR** ondansetron (ZOFRAN) IV, polyvinyl alcohol, sodium chloride, sodium chloride flush, sodium chloride flush, white petrolatum  Physical Exam         Weak appearing gentleman Appears with at least mild to moderate baseline dyspnea S1 S2 Diminished Abdomen soft Edema 2-3+ Awake alert  Vital Signs: BP 113/66   Pulse 74   Temp 98 F (36.7 C) (Axillary)   Resp (!) 25   Ht 5' 8"  (1.727 m)   Wt 82 kg (180 lb 12.4  oz)   SpO2 96%   BMI 27.49 kg/m  SpO2: SpO2: 96 % O2 Device: O2 Device: Nasal Cannula O2 Flow Rate: O2 Flow Rate (L/min): 2 L/min  Intake/output summary:  Intake/Output Summary (Last 24 hours) at 10/09/15 1517 Last data filed at 10/09/15 1500  Gross per 24 hour  Intake           1543.8 ml  Output              645 ml  Net            898.8 ml   LBM: Last BM Date: 10/07/15 Baseline Weight: Weight: 80.8 kg (178 lb 2.1 oz) Most recent weight: Weight: 82 kg (180 lb 12.4 oz)       Palliative Assessment/Data:    Flowsheet Rows   Flowsheet Row Most Recent Value  Intake Tab  Referral Department  Cardiology  Unit at Time of Referral  ICU  Palliative Care Primary Diagnosis  Cardiac  Date Notified  10/07/15  Palliative Care Type  Return patient Palliative Care  Reason for referral  End of Life Care Assistance, Pain, Non-pain Symptom, Clarify Goals of Care  Date of Admission  09/30/15  Date first seen by Palliative Care  10/08/15  # of days Palliative referral response time  1 Day(s)  # of days IP prior to Palliative referral  7  Clinical Assessment  Palliative Performance Scale Score  30%  Pain Max last 24 hours  6  Pain Min Last 24 hours  5  Dyspnea Max Last 24 Hours  6  Dyspnea Min Last 24 hours  4  Nausea Max Last 24 Hours  5  Nausea Min Last 24 Hours  4  Anxiety Max Last 24 Hours  4  Anxiety Min Last 24 Hours  3  Psychosocial & Spiritual Assessment  Palliative Care  Outcomes  Patient/Family meeting held?  Yes  Who was at the meeting?  patient, son in law, daughter, wife   Palliative Care Outcomes  Clarified goals of care      Patient Active Problem List   Diagnosis Date Noted  . Cardiogenic shock ([place]) 09/30/2015  . Acute renal failure ([place])   . Ventricular tachycardia ([place])   . Sustained ventricular fibrillation ([place]) 09/25/2015  . Acute on chronic systolic (congestive) heart failure ([place]) 08/21/2015  . Obstructive sleep apnea 08/21/2015  . Weakness generalized   . S/P CABG (coronary artery bypass graft)   . S/P ICD (internal cardiac defibrillator) procedure   . Goals of care, counseling/discussion 08/13/2015  . Palliative care encounter 08/13/2015  . Cardiorenal syndrome with renal failure - improving with IV Lasix 08/08/2015  . Elevated troponin   . Dyspnea 08/06/2015  . Atherosclerosis of coronary artery bypass graft of native heart without angina pectoris   . Pulmonary HTN ([place])   . Centrilobular emphysema ([place])   . Anemia 06/17/2015  . Weight loss 04/24/2015  . Preventative health care 10/21/2014  . HLD (hyperlipidemia) 10/21/2014  . HTN (hypertension) 10/21/2014  . CKD (chronic kidney disease) stage 3, GFR 30-59 ml/min 10/21/2014  . GERD (gastroesophageal reflux disease) 10/21/2014  . Osteoporosis 10/21/2014  . CAD (coronary artery disease) 10/21/2014  . COPD (chronic obstructive pulmonary disease) ([place]) 10/21/2014  . Rheumatoid arthritis ([place]) 10/21/2014  . Hypothyroidism 10/21/2014  . Gout 10/21/2014  . Cardiomyopathy, ischemic 10/21/2014    Palliative Care Assessment & Plan   Patient Profile:    Assessment:  68 with history of CAD s/p CABG, ischemic cardiomyopathy  s/p [employer] CRT-D, paroxysmal atrial fibrillation, CKD admitted with VT and ICD discharge as a transfer from [facility].   V tach Acute on chronic Systolic CHF EF 75-88% AKI on stage III CKD A fib Generalized deconditioning CAD s/p  CABG Dyspnea Possibly entering into the dying process.   Recommendations/Plan:  Family Meeting:     Met with patient, wife, son in law and daughter Colletta [place] in the patient's room. Family wished to ask more about comfort measures.   Discussed about de activation of ICD, appropriate use of opiodis and benzodiazepines for symptom management,d/c of milrinone upon transfer to [facility], careful monitoring over weekend, seeking residential hospice in [place] if deemed appropriate on Monday September 25.   All questions answered to the best of my ability. We will continue to follow along and help guide comfort measures as well as further decision making. Thank you for allowing palliative team to be a part of Mr Woodruff care.   Goals of Care and Additional Recommendations:  Limitations on Scope of Treatment: Full Comfort Care  Code Status:    Code Status Orders        Start     Ordered   10/09/15 1513  Do not attempt resuscitation (DNR)  Continuous    Question Answer Comment  In the event of cardiac or respiratory ARREST Do not call a "code blue"   In the event of cardiac or respiratory ARREST Do not perform Intubation, CPR, defibrillation or ACLS   In the event of cardiac or respiratory ARREST Use medication by any route, position, wound care, and other measures to relive pain and suffering. May use oxygen, suction and manual treatment of airway obstruction as needed for comfort.      10/09/15 1515    Code Status History    Date Active Date Inactive Code Status Order ID Comments User Context   09/30/2015 12:07 PM 10/09/2015  3:15 PM Full Code [id number]  Erma Heritage, [place] Inpatient   09/24/2015  9:04 PM 09/30/2015 11:47 AM Full Code [id number]  Flora Lipps, MD ED   08/13/2015 11:43 AM 08/18/2015  9:16 PM Partial Code [id number]  Knox Royalty, NP Inpatient   08/06/2015  1:56 PM 08/13/2015 11:43 AM Full Code [id number]  Gladstone Lighter, MD ED   04/13/2015  3:19 PM 04/16/2015  6:11 PM DNR  [id number]  Bettey Costa, MD Inpatient    Advance Directive Documentation   Flowsheet Row Most Recent Value  Type of Advance Directive  Living will  Pre-existing out of facility DNR order (yellow form or pink MOST form)  No data  "MOST" Form in Place?  No data       Prognosis:   < 2 weeks  Discharge Planning:  Anticipated Hospital Death versus residential hospice at [facility] [place] was discussed with    Thank you for allowing the Palliative Medicine Team to assist in the care of this patient.   Time In: 1400 Time Out: 1435 Total Time 35 Prolonged Time Billed  no       Greater than 50%  of this time was spent counseling and coordinating care related to the above assessment and plan.  Loistine Chance, MD [phone]  Please contact Palliative Medicine Team phone at [phone] for questions and concerns.

## 2015-10-09 NOTE — Progress Notes (Signed)
Patient has home CPAP machine but does not want to wear tonight.  Patient, family member, and wife instructed to notify RN or RT if patient decides to wear.

## 2015-10-09 NOTE — Progress Notes (Signed)
CSW updated [employer] that pt is not yet ready for dc and that palliative care is involved.   Osborne Casco Vaness Jelinski LCSWA [phone]

## 2015-10-09 NOTE — Progress Notes (Signed)
Spoke with Dr. Linna Darner: family has requested that fentanyl drip not be started until patient is on 66 [place], as they are anticipating family arriving later today. Pharmacy notified and will hold on to drip until pt is on 6 [place].

## 2015-10-09 NOTE — Progress Notes (Signed)
Pharmacy notified of pt transfer to [facility] and that fentanyl drip should be sent to [address] [place].

## 2015-10-09 NOTE — Progress Notes (Signed)
Patient ID: Tom Nguyen, male   DOB: [birth year], 82 y.o.   MRN: [id number]      SUBJECTIVE: Patient transferred from [facility] 9/13.  He was initially admitted to [facility] after VT with ICD discharge, then developed progressive renal dysfunction with volume overload.  Creatinine up to 3.73.  He was started on dopamine 9/12 but had VT in 140s terminated by ATP.    Remains on milrinone 0.125 mcg/kg/min without VT. Coox 91%. Yesterday he was diuresed with IV lasix + metolazone. Poor diuresis noted. Weight trending up.     SOB at rest.   Scheduled Meds: . allopurinol  100 mg Oral Daily  . arformoterol  15 mcg Nebulization BID   And  . umeclidinium bromide  1 puff Inhalation Daily  . clopidogrel  75 mg Oral Daily  . darbepoetin (ARANESP) injection - NON-DIALYSIS  100 mcg Subcutaneous Q Tue-1800  . ferumoxytol  510 mg Intravenous Weekly  . folic acid  1 mg Oral Daily  . furosemide  160 mg Intravenous Q8H  . heparin  5,000 Units Subcutaneous Q8H  . leflunomide  20 mg Oral Daily  . levothyroxine  75 mcg Oral QAC breakfast  . mouth rinse  15 mL Mouth Rinse BID  . montelukast  10 mg Oral Daily  . pantoprazole  40 mg Oral Daily  . simvastatin  40 mg Oral q1800  . sodium chloride flush  10-40 mL Intracatheter Q12H  . sodium chloride flush  3 mL Intravenous Q12H   Continuous Infusions: . amiodarone 30 mg/hr (10/09/15 0415)  . milrinone 0.125 mcg/kg/min (10/08/15 1600)   PRN Meds:.sodium chloride, acetaminophen, benzocaine, ipratropium-albuterol, nitroGLYCERIN, ondansetron (ZOFRAN) IV, sodium chloride, sodium chloride flush, sodium chloride flush, white petrolatum    Vitals:   10/09/15 0333 10/09/15 0400 10/09/15 0500 10/09/15 0750  BP:  107/64  119/66  Pulse: 77 80  78  Resp: 18 (!) 21  (!) 24  Temp:    97.5 F (36.4 C)  TempSrc:    Oral  SpO2: 95% 91%  95%  Weight:   180 lb 12.4 oz (82 kg)   Height:        Intake/Output Summary (Last 24 hours) at 10/09/15 0838 Last data filed at  10/09/15 0648  Gross per 24 hour  Intake           1098.4 ml  Output              645 ml  Net            453.4 ml    LABS: Basic Metabolic Panel:  Recent Labs  10/62/69 0431 10/09/15 0500  NA 133* 130*  K 3.6 3.5  CL 94* 92*  CO2 25 24  GLUCOSE 97 90  BUN 86* 93*  CREATININE 4.29* 4.73*  CALCIUM 8.3* 8.2*  MG 1.9 2.0  PHOS 3.7 3.8   Liver Function Tests:  Recent Labs  10/08/15 0431 10/09/15 0500  ALBUMIN 2.6* 2.7*   No results for input(s): LIPASE, AMYLASE in the last 72 hours. CBC:  Recent Labs  10/08/15 0431 10/09/15 0500  WBC 5.3 6.0  HGB 8.3* 7.9*  HCT 24.4* 23.4*  MCV 85.9 86.0  PLT 80* 93*   Cardiac Enzymes: No results for input(s): CKTOTAL, CKMB, CKMBINDEX, TROPONINI in the last 72 hours. BNP: Invalid input(s): POCBNP D-Dimer: No results for input(s): DDIMER in the last 72 hours. Hemoglobin A1C: No results for input(s): HGBA1C in the last 72 hours. Fasting Lipid Panel: No results for  input(s): CHOL, HDL, LDLCALC, TRIG, CHOLHDL, LDLDIRECT in the last 72 hours. Thyroid Function Tests: No results for input(s): TSH, T4TOTAL, T3FREE, THYROIDAB in the last 72 hours.  Invalid input(s): FREET3 Anemia Panel: No results for input(s): VITAMINB12, FOLATE, FERRITIN, TIBC, IRON, RETICCTPCT in the last 72 hours.  RADIOLOGY: US Renal  Result Date: 09/27/2015 CLINICAL DATA:  Acute renal failure. EXAM: RENAL / URINARY TRACT ULTRASOUND COMPLETE COMPARISON:  08/07/2015 FINDINGS: Right Kidney: Length: 9.9 cm. Cortical thinning noted with slight increased cortical echogenicity. No hydronephrosis. Left Kidney: Length: 11.5 cm multiple simple appearing cysts are identified, as before. No hydronephrosis. Echogenicity within normal limits. No mass or hydronephrosis visualized. Bladder: Appears normal for degree of bladder distention. Other:  Small volume ascites noted with 2.8 cm hepatic cyst evident. IMPRESSION: No evidence for hydronephrosis. Electronically Signed    By: Kennith Center M.D.   On: 09/27/2015 10:30   Dg Chest Port 1 View  Result Date: 09/30/2015 CLINICAL DATA:  Central line placement EXAM: PORTABLE CHEST 1 VIEW COMPARISON:  09/25/2015 FINDINGS: Left pacer remains in place, unchanged. Interval placement of left internal jugular central line. The tip projects superiorly into the lateral wall of the SVC or possibly into the lower right innominate vein. No pneumothorax. There is cardiomegaly with vascular congestion. Bibasilar atelectasis and small effusions. IMPRESSION: Left PICC line tip projects superiorly into the lateral wall of the SVC or possibly into the lower portion of the right innominate vein. Cardiomegaly with vascular congestion. Small effusions with bibasilar atelectasis. Electronically Signed   By: Charlett Nose M.D.   On: 09/30/2015 14:10   Dg Chest Port 1 View  Result Date: 09/25/2015 CLINICAL DATA:  82 year old male with shortness of breath. EXAM: PORTABLE CHEST 1 VIEW COMPARISON:  Chest radiograph dated 09/24/2015 FINDINGS: There is stable moderate cardiomegaly with minimal central vascular prominence. There is no focal consolidation, pleural effusion, or pneumothorax. Right apical pleural thickening. Left pectoral AICD device and median sternotomy wires noted. There is osteopenia with degenerative changes of the spine. Bilateral shoulder arthroplasty. No acute fracture. IMPRESSION: Cardiomegaly with probable mild congestive changes. No focal consolidation or edema. Electronically Signed   By: Elgie Collard M.D.   On: 09/25/2015 02:59   Dg Chest Port 1 View  Result Date: 09/24/2015 CLINICAL DATA:  Dyspnea EXAM: PORTABLE CHEST 1 VIEW COMPARISON:  08/12/2015 FINDINGS: Cardiomegaly again noted. Status post median sternotomy. 3 leads cardiac pacemaker is unchanged in position. No infiltrate or pulmonary edema. Bilateral shoulder prosthesis again noted. IMPRESSION: Cardiomegaly. No active disease. Cardiac pacemaker unchanged in position.  Electronically Signed   By: Natasha Mead M.D.   On: 09/24/2015 20:24    PHYSICAL EXAM CVP 20 General: Elderly and chronically ill appearing. In the chair. Dyspneic at rest.  Neck: JVP to ear cm, no thyromegaly or thyroid nodule.  Lungs: Diminished bibasilar sounds, with scant crackles.  CV: Lateral PMI.  Heart sounds distant, regular S1/S2, no S3/S4, no murmur.  3+edema 1/3 to knees bilaterally.  Ted hose in place Abdomen: Soft, NT, ND, no HSM. No bruits or masses. +BS  Neurologic: Alert and oriented x 3.  Psych: Normal affect. Extremities: No clubbing or cyanosis.   TELEMETRY: Reviewed personally, NSR with BiV pacing.   ASSESSMENT AND PLAN: 1 with history of CAD s/p CABG, ischemic cardiomyopathy s/p [employer] CRT-D, paroxysmal atrial fibrillation, CKD admitted with VT and ICD discharge.  At [facility], he developed progressive volume overload and renal dysfunction, had VT again with dopamine use.  Transferred to [facility].  1. VT: Appropriate ICD discharge prior to admission.  Had another VT episode in hospital on dopamine, terminated with ATP. Do not think this was triggered by ACS, likely scar-mediated. No chest pain. K 3.5 and Mag 1.9 .    - Continue amiodarone gtt while on low dose milrinone.   2. CAD: s/p CABG.  Had LHC in 7/17 with no options for revascularization.  Would not repeat cath (especially with AKI). Elevated troponin at admission may be demand ischemia with volume overload and defibrillation.  - Continue Plavix, statin.  3. Acute on chronic systolic CHF: EF 65-78%, moderate MR, moderately decreased RV systolic function, moderate TR on 7/17 echo, ischemic cardiomyopathy.  [employer] CRT-D.  RHC in 7/17 showed low cardiac output and elevated filling pressures (PA 55/31, mean PCWP 32, CI 1.6). Yesterday he was transitioned form torsemide back to IV lasix.  Today's CVP has gone up from 15>17. Poor diuresis noted. Creatinine trending up. - Continue 160 mg IV lasix three times  a day and give 5 mg metolazone.    Continue milrinone for now. He will not be discharged on milrinone .   - In absence of renal recovery, prognosis poor. Not a candidate for HD.  - Continue ted hose  4. AKI on CKD stage III: Most likely hemodynamic => suspect ATN event in the setting of initial cardiac arrest with hypotension. Renal following (appreciate), not a good long-term HD candidate.  Have been able to avoid CVVH so far.  - Today's Creatinine trending up-->4.2>4.29 -->4.73  5. Atrial fibrillation: History of PAF, has not been anticoagulated however. On heparin [place] for DVT prophylaxis.  6. Deconditioning- PT following. Recommending SNF.  Consult SW.  7. Hypokalemia- Today K, 3.5 8. Hypomagnesia - Mag 1.9   Palliative Care appreciated. Anticipate transition to full comfort care. Add prn morphine.   Tonye Becket, NP-C  10/09/2015 8:38 AM  Advanced Heart Failure Team Pager [phone] (M-F; 7a - 4p)  Please contact [facility] for night-coverage after hours (4p -7a ) and weekends on [email or web address]  Patient seen with NP, agree with the above note.  UOP worsening, not much response to high dose Lasix/metolazone.  CVP up to 20.  Cardiac output adequate.    At this point, I do not think we have much left to offer.  He is not a HD candidate.  We discussed possible transition to comfort care and turning off defibrillator.  Palliative care will come by later today.   Marca Ancona 10/09/2015 8:59 AM

## 2015-10-09 NOTE — Progress Notes (Signed)
   10/09/15 1500  Clinical Encounter Type  Visited With Patient and family together;Health care provider (Mother, daughter, beside nurse)  Visit Type Other (Comment) (Spiritual care consult)  Consult/Referral To Nurse   Chaplain responded to spiritual care consult  -Introduced chaplaincy services  - Pt. is Catholic    - Offered to Doctor, general practice parish.   - Per daughter, priest previously visited/performed communion.   Will follow, as needed.  - Rev. Chaplain Kipp Brood MDiv ThM

## 2015-10-09 NOTE — Progress Notes (Signed)
Patient ID: Tom Nguyen, male   DOB: 06/20/1932, 82 y.o.   MRN: [id number]  [facility] Progress Note    Subjective:    No progress made Renal function and UOP continue to decline Dr. Shirlee Latch feels Palliative are now indicated and they are to return today to see pt   Objective:   BP 119/66 (BP Location: Left Arm)   Pulse 78   Temp 97.5 F (36.4 C) (Oral)   Resp (!) 24   Ht 5\' 8"  (1.727 m)   Wt 82 kg (180 lb 12.4 oz)   SpO2 99%   BMI 27.49 kg/m   Intake/Output Summary (Last 24 hours) at 10/09/15 1037 Last data filed at 10/09/15 10/11/15  Gross per 24 hour  Intake             1059 ml  Output              645 ml  Net              414 ml    Physical Exam: VS as noted CVP 21 Up in the chair, wife and son in room Extremely weak and frail Some increased work of breathing Regular (paced) rhythm L ICD non tender S1 and S2 normal Breath sounds diminished bilaterally, basilar crackles Soft, obese, nontender abdomen Ext: 1+ pitting upper and 2+  lower extremity edema bilaterally unchanged    Recent Labs Lab 10/03/15 1646 10/04/15 0030 10/04/15 0530 10/05/15 0224 10/06/15 0500 10/07/15 0403 10/08/15 0431 10/09/15 0500  NA 133*  --  134* 135 137 133* 133* 130*  K 2.9* 3.4* 3.3* 3.6 3.1* 3.9 3.6 3.5  CL 96*  --  94* 97* 95* 95* 94* 92*  CO2 24  --  25 25 27 25 25 24   GLUCOSE 152*  --  101* 121* 86 98 97 90  BUN 74*  --  75* 78* 78* 82* 86* 93*  CREATININE 4.12*  --  4.02* 3.99* 4.07* 4.20* 4.29* 4.73*  CALCIUM 8.0*  --  8.0* 8.1* 8.2* 8.1* 8.3* 8.2*  PHOS  --   --   --   --  3.6 3.7 3.7 3.8     Recent Labs Lab 10/06/15 0500 10/07/15 0403 10/08/15 0431 10/09/15 0500  WBC 5.0 5.1 5.3 6.0  HGB 8.3* 8.4* 8.3* 7.9*  HCT 24.0* 25.0* 24.4* 23.4*  MCV 87.0 86.8 85.9 86.0  PLT 81* 100* 80* 93*    Medications:    . allopurinol  100 mg Oral Daily  . arformoterol  15 mcg Nebulization BID   And  . umeclidinium bromide  1 puff Inhalation Daily  .  clopidogrel  75 mg Oral Daily  . darbepoetin (ARANESP) injection - NON-DIALYSIS  100 mcg Subcutaneous Q Tue-1800  . ferumoxytol  510 mg Intravenous Weekly  . folic acid  1 mg Oral Daily  . furosemide  160 mg Intravenous Q8H  . heparin  5,000 Units Subcutaneous Q8H  . leflunomide  20 mg Oral Daily  . levothyroxine  75 mcg Oral QAC breakfast  . mouth rinse  15 mL Mouth Rinse BID  . montelukast  10 mg Oral Daily  . pantoprazole  40 mg Oral Daily  . simvastatin  40 mg Oral q1800  . sodium chloride flush  10-40 mL Intracatheter Q12H  . sodium chloride flush  3 mL Intravenous Q12H   Infusion Medications . amiodarone 30 mg/hr (10/09/15 0415)  . milrinone 0.125 mcg/kg/min (10/08/15 1600)   Background: 82 year  old gentleman transferred from [facility] with bifactorial AKI  d/t chronic cardiorenal syndrome (EF 20-25%) with acute component related to exacerbation of CHF plus possible ischemic ATN after episode pf VT/relative hypotension with renal hypoperfusion injury.  Pt is well aware of his poor candidacy as a long-term dialysis patient and supports not pursuing chronic hemodialysis.   Baseline creatinine (07/2015) appears to be around 1.4-1.5, up to 1.7 on admission to [facility], with peak creatinine since transfer to [facility] 4.22 on 10/07/15.  ([place] 9.9, 11.5 cm kidneys without hydro, UA entirely benign).   Assessment/ Plan:    Acute renal failure on chronic kidney disease stage III:  Dual injury from ischemic ATN from V. tach/relative hypotension and CHF exacerbation with renal hypoperfusion on top of chronic cardiorenal syndrome.  GFR has dropped by > 50% with this current renal injury and continues to decline (currently ~ 11) - >has shown no recovery from this episode despite ceiling dose diuretics and inotropic support UOP declining despite resumption of ceiling dose diuretics He does not want (and is not candidate for) long term HD and it would offer no mortality benefit to him. Comfort care is appropriate  at this point  Anemia Hb down to 7.9 Rec'd 1st of 2 doses of Feraheme 510 IV 9/20, Aranesp 100/week started 9/19  CAD/prior CABG Elev trop demand isch on adm No repeat cath planned  Combined systolic/diastolic heart failure  EF 25-30% w/MR, TR On milrinone Back on IV lasix and po metolazone  S/p VT  S/p defibrillation and then chemical cardioversion Remains in sinus rhythm on IV amio  Hypokalemia:  K normal today (dosed with total of 80 mEq 9/19)   Camille Bal, MD [facility] [phone] Pager 10/09/2015, 10:37 AM

## 2015-10-10 DIAGNOSIS — N178 Other acute kidney failure: Secondary | ICD-10-CM

## 2015-10-10 MED ORDER — DIPHENHYDRAMINE HCL 25 MG PO CAPS
25.0000 mg | ORAL_CAPSULE | Freq: Four times a day (QID) | ORAL | Status: DC | PRN
  Administered 2015-10-10: 25 mg via ORAL
  Filled 2015-10-10: qty 1

## 2015-10-10 MED ORDER — LORAZEPAM 2 MG/ML IJ SOLN
0.5000 mg | Freq: Four times a day (QID) | INTRAMUSCULAR | Status: DC
  Administered 2015-10-10 (×2): 0.5 mg via INTRAVENOUS
  Filled 2015-10-10 (×2): qty 1

## 2015-10-10 NOTE — Progress Notes (Signed)
Daily Progress Note   Patient Name: Tom Nguyen       Date: 10/10/2015 DOB: [birth year]  Age: 82 y.o. MRN#: [id number] Attending Physician: Laurey Morale, MD Primary Care Physician: Olevia Perches, DO Admit Date: 09/30/2015  Reason for Consultation/Follow-up: Establishing goals of care  Subjective:  awakens easily, with shallow breathing. Does get anxious at times.  Family holding vigil at the bedside Appears comfortable on current fentanyl drip. ICD is now off. Also off Milrinone. Minimal urine output Family asking about residential hospice transfer over the weekend, this is reasonable if it can be arranged over the weekend. I have placed social work consult to facilitate this.   Length of Stay: 10  Current Medications: Scheduled Meds:  . allopurinol  100 mg Oral Daily  . arformoterol  15 mcg Nebulization BID   And  . umeclidinium bromide  1 puff Inhalation Daily  . furosemide  160 mg Intravenous Q8H  . leflunomide  20 mg Oral Daily  . LORazepam  0.5 mg Intravenous Q6H  . mouth rinse  15 mL Mouth Rinse BID  . montelukast  10 mg Oral Daily  . pantoprazole  40 mg Oral Daily  . sodium chloride flush  10-40 mL Intracatheter Q12H    Continuous Infusions: . sodium chloride 10 mL/hr at 10/09/15 1530  . fentaNYL infusion INTRAVENOUS 25 mcg/hr (10/09/15 1900)    PRN Meds: sodium chloride, acetaminophen **OR** acetaminophen, acetaminophen, antiseptic oral rinse, benzocaine, bisacodyl, fentaNYL, fentaNYL (SUBLIMAZE) injection, glycopyrrolate **OR** glycopyrrolate **OR** glycopyrrolate, haloperidol **OR** haloperidol **OR** haloperidol lactate, ipratropium-albuterol, LORazepam **OR** LORazepam **OR** LORazepam, morphine injection, nitroGLYCERIN, ondansetron (ZOFRAN) IV, ondansetron **OR**  ondansetron (ZOFRAN) IV, polyvinyl alcohol, sodium chloride, sodium chloride flush, white petrolatum  Physical Exam         Weak appearing gentleman Appears with at least mild to moderate anxiety S1 S2 Diminished Abdomen soft Edema 2-3+ Awake alert  Vital Signs: BP 122/62 (BP Location: Right Arm)   Pulse 78   Temp 97.6 F (36.4 C) (Oral)   Resp 18   Ht 5\' 8"  (1.727 m)   Wt 82 kg (180 lb 12.4 oz)   SpO2 96%   BMI 27.49 kg/m  SpO2: SpO2: 96 % O2 Device: O2 Device: Nasal Cannula O2 Flow Rate: O2 Flow Rate (L/min): 2 L/min  Intake/output summary:   Intake/Output  Summary (Last 24 hours) at 10/10/15 1056 Last data filed at 10/10/15 5465  Gross per 24 hour  Intake           779.77 ml  Output              575 ml  Net           204.77 ml   LBM: Last BM Date: 10/07/15 Baseline Weight: Weight: 80.8 kg (178 lb 2.1 oz) Most recent weight: Weight: 82 kg (180 lb 12.4 oz)       Palliative Assessment/Data:    Flowsheet Rows   Flowsheet Row Most Recent Value  Intake Tab  Referral Department  Cardiology  Unit at Time of Referral  ICU  Palliative Care Primary Diagnosis  Cardiac  Date Notified  10/07/15  Palliative Care Type  Return patient Palliative Care  Reason for referral  End of Life Care Assistance, Pain, Non-pain Symptom, Clarify Goals of Care  Date of Admission  09/30/15  Date first seen by Palliative Care  10/08/15  # of days Palliative referral response time  1 Day(s)  # of days IP prior to Palliative referral  7  Clinical Assessment  Palliative Performance Scale Score  30%  Pain Max last 24 hours  6  Pain Min Last 24 hours  5  Dyspnea Max Last 24 Hours  6  Dyspnea Min Last 24 hours  4  Nausea Max Last 24 Hours  5  Nausea Min Last 24 Hours  4  Anxiety Max Last 24 Hours  4  Anxiety Min Last 24 Hours  3  Psychosocial & Spiritual Assessment  Palliative Care Outcomes  Patient/Family meeting held?  Yes  Who was at the meeting?  patient, son in law, daughter, wife    Palliative Care Outcomes  Clarified goals of care      Patient Active Problem List   Diagnosis Date Noted  . Cardiogenic shock (HCC) 09/30/2015  . Acute renal failure (HCC)   . Ventricular tachycardia (HCC)   . Sustained ventricular fibrillation (HCC) 09/25/2015  . Acute on chronic systolic (congestive) heart failure (HCC) 08/21/2015  . Obstructive sleep apnea 08/21/2015  . Weakness generalized   . S/P CABG (coronary artery bypass graft)   . S/P ICD (internal cardiac defibrillator) procedure   . Goals of care, counseling/discussion 08/13/2015  . Palliative care encounter 08/13/2015  . Cardiorenal syndrome with renal failure - improving with IV Lasix 08/08/2015  . Elevated troponin   . Dyspnea 08/06/2015  . Atherosclerosis of coronary artery bypass graft of native heart without angina pectoris   . Pulmonary HTN (HCC)   . Centrilobular emphysema (HCC)   . Anemia 06/17/2015  . Weight loss 04/24/2015  . Preventative health care 10/21/2014  . HLD (hyperlipidemia) 10/21/2014  . HTN (hypertension) 10/21/2014  . CKD (chronic kidney disease) stage 3, GFR 30-59 ml/min 10/21/2014  . GERD (gastroesophageal reflux disease) 10/21/2014  . Osteoporosis 10/21/2014  . CAD (coronary artery disease) 10/21/2014  . COPD (chronic obstructive pulmonary disease) (HCC) 10/21/2014  . Rheumatoid arthritis (HCC) 10/21/2014  . Hypothyroidism 10/21/2014  . Gout 10/21/2014  . Cardiomyopathy, ischemic 10/21/2014    Palliative Care Assessment & Plan   Patient Profile:    Assessment:  38 with history of CAD s/p CABG, ischemic cardiomyopathy s/p [employer] CRT-D, paroxysmal atrial fibrillation, CKD admitted with VT and ICD discharge as a transfer from [facility].   V tach Acute on chronic Systolic CHF EF 25-30% AKI on stage III CKD A  fib Generalized deconditioning CAD s/p CABG Dyspnea Possibly entering into the dying process.   Recommendations/Plan: Continue current fentanyl drip Add  Ativan low dose scheduled CSW consult to arrange hospice transfer, it will be ideal if it can be done asap as per family request.    Goals of Care and Additional Recommendations:  Limitations on Scope of Treatment: Full Comfort Care  Code Status:    Code Status Orders        Start     Ordered   10/09/15 1513  Do not attempt resuscitation (DNR)  Continuous    Question Answer Comment  In the event of cardiac or respiratory ARREST Do not call a "code blue"   In the event of cardiac or respiratory ARREST Do not perform Intubation, CPR, defibrillation or ACLS   In the event of cardiac or respiratory ARREST Use medication by any route, position, wound care, and other measures to relive pain and suffering. May use oxygen, suction and manual treatment of airway obstruction as needed for comfort.      10/09/15 1515    Code Status History    Date Active Date Inactive Code Status Order ID Comments User Context   09/30/2015 12:07 PM 10/09/2015  3:15 PM Full Code [id number]  Ellsworth Lennox, [place] Inpatient   09/24/2015  9:04 PM 09/30/2015 11:47 AM Full Code [id number]  Erin Fulling, MD ED   08/13/2015 11:43 AM 08/18/2015  9:16 PM Partial Code [id number]  Canary Brim, NP Inpatient   08/06/2015  1:56 PM 08/13/2015 11:43 AM Full Code [id number]  Enid Baas, MD ED   04/13/2015  3:19 PM 04/16/2015  6:11 PM DNR [id number]  Adrian Saran, MD Inpatient    Advance Directive Documentation   Flowsheet Row Most Recent Value  Type of Advance Directive  Living will  Pre-existing out of facility DNR order (yellow form or pink MOST form)  No data  "MOST" Form in Place?  No data       Prognosis:   < 2 weeks  Discharge Planning:    residential hospice at [facility] hospice home  Care plan was discussed with    Thank you for allowing the Palliative Medicine Team to assist in the care of this patient.   Time In: 10 Time Out: 1035 Total Time 35 Prolonged Time Billed  no       Greater than  50%  of this time was spent counseling and coordinating care related to the above assessment and plan.  Rosalin Hawking, MD [phone]  Please contact Palliative Medicine Team phone at [phone] for questions and concerns.

## 2015-10-10 NOTE — Progress Notes (Signed)
Patient complaining of overall itching. Says it has been going on for a while. No rash noted. He is requesting Benadryl. Paged cardiology fellow.

## 2015-10-10 NOTE — Clinical Social Work Note (Signed)
RN notified CSW that pt wife is requesting hospice at home. CSW met with pt and pt family regarding family requesting information about home hospice. CSW spoke with MD via phone and MD stated she is ok with pt discharging home with hospice if the pt is able to continue receiving drip at home to ensure pt is comfortable. CSW spoke with hospice home of [place] and facility requested information on pt be faxed to see if pt is able to admitted to facility.   CSW notified Case management to conduct consult for home hospice. Case management stated someone will meet with the family to discuss hospice at home.

## 2015-10-10 NOTE — Progress Notes (Signed)
Pt sitting out of bed in chair . Several family members in room with pt. No signs of respiratory distress noted on room air. Pt denies pain and says 'this is the best I have felt in a long time'

## 2015-10-10 NOTE — Progress Notes (Signed)
Received a call from assistant hospice director at [facility] and she wanted to know pt's renal labwork. Information provided as requested. She went on to say that she will consult with their hospice doctors and if accepted, pt may be able to be moved to their facility tomorrow. Once accepted, she will liaise with the social worker here and get the process for pt to be discharged from here to their facility going.

## 2015-10-10 NOTE — Progress Notes (Addendum)
SUBJECTIVE:   Patient transferred from [facility] 9/13.  He was initially admitted to [facility] after VT with ICD discharge, then developed progressive renal dysfunction with volume overload.  Creatinine up to 3.73.  He was started on dopamine 9/12 but had VT in 140s terminated by ATP.    OBJECTIVE:   Vitals:   Vitals:   10/09/15 1830 10/09/15 1852 10/10/15 0600 10/10/15 0851  BP:  (!) 113/58 122/62   Pulse: 77 78 78   Resp:   18   Temp:  97.3 F (36.3 C) 97.6 F (36.4 C)   TempSrc:  Oral Oral   SpO2:  93% 90% 96%  Weight:      Height:       I&O's:   Intake/Output Summary (Last 24 hours) at 10/10/15 0925 Last data filed at 10/10/15 1224  Gross per 24 hour  Intake           809.47 ml  Output              575 ml  Net           234.47 ml   TELEMETRY: Reviewed telemetry pt in NSR:     PHYSICAL EXAM General: Well developed, well nourished, in no acute distress Head: Eyes PERRLA, No xanthomas.   Normal cephalic and atramatic  Lungs:   Clear bilaterally to auscultation and percussion. Heart:   HRRR S1 S2 Pulses are 2+ & equal. Abdomen: Bowel sounds are positive, abdomen soft and non-tender without masses \ Msk:  Back normal, normal gait. Normal strength and tone for age. Extremities:   No clubbing, cyanosis or edema.  DP +1 Neuro: Alert and oriented X 3. Psych:  Good affect, responds appropriately   LABS: Basic Metabolic Panel:  Recent Labs  10/08/15 0431 10/09/15 0500  NA 133* 130*  K 3.6 3.5  CL 94* 92*  CO2 25 24  GLUCOSE 97 90  BUN 86* 93*  CREATININE 4.29* 4.73*  CALCIUM 8.3* 8.2*  MG 1.9 2.0  PHOS 3.7 3.8   Liver Function Tests:  Recent Labs  10/08/15 0431 10/09/15 0500  ALBUMIN 2.6* 2.7*   No results for input(s): LIPASE, AMYLASE in the last 72 hours. CBC:  Recent Labs  10/08/15 0431 10/09/15 0500  WBC 5.3 6.0  HGB 8.3* 7.9*  HCT 24.4* 23.4*  MCV 85.9 86.0  PLT 80* 93*   Cardiac Enzymes: No results for input(s): CKTOTAL, CKMB, CKMBINDEX,  TROPONINI in the last 72 hours. BNP: Invalid input(s): POCBNP D-Dimer: No results for input(s): DDIMER in the last 72 hours. Hemoglobin A1C: No results for input(s): HGBA1C in the last 72 hours. Fasting Lipid Panel: No results for input(s): CHOL, HDL, LDLCALC, TRIG, CHOLHDL, LDLDIRECT in the last 72 hours. Thyroid Function Tests: No results for input(s): TSH, T4TOTAL, T3FREE, THYROIDAB in the last 72 hours.  Invalid input(s): FREET3 Anemia Panel: No results for input(s): VITAMINB12, FOLATE, FERRITIN, TIBC, IRON, RETICCTPCT in the last 72 hours. Coag Panel:   Lab Results  Component Value Date   INR 1.51 09/24/2015    RADIOLOGY: US Renal  Result Date: 09/27/2015 CLINICAL DATA:  Acute renal failure. EXAM: RENAL / URINARY TRACT ULTRASOUND COMPLETE COMPARISON:  08/07/2015 FINDINGS: Right Kidney: Length: 9.9 cm. Cortical thinning noted with slight increased cortical echogenicity. No hydronephrosis. Left Kidney: Length: 11.5 cm multiple simple appearing cysts are identified, as before. No hydronephrosis. Echogenicity within normal limits. No mass or hydronephrosis visualized. Bladder: Appears normal for degree of bladder distention. Other:  Small  volume ascites noted with 2.8 cm hepatic cyst evident. IMPRESSION: No evidence for hydronephrosis. Electronically Signed   By: Misty Stanley M.D.   On: 09/27/2015 10:30   Dg Chest Port 1 View  Result Date: 09/30/2015 CLINICAL DATA:  Central line placement EXAM: PORTABLE CHEST 1 VIEW COMPARISON:  09/25/2015 FINDINGS: Left pacer remains in place, unchanged. Interval placement of left internal jugular central line. The tip projects superiorly into the lateral wall of the SVC or possibly into the lower right innominate vein. No pneumothorax. There is cardiomegaly with vascular congestion. Bibasilar atelectasis and small effusions. IMPRESSION: Left PICC line tip projects superiorly into the lateral wall of the SVC or possibly into the lower portion of the  right innominate vein. Cardiomegaly with vascular congestion. Small effusions with bibasilar atelectasis. Electronically Signed   By: Rolm Baptise M.D.   On: 09/30/2015 14:10   Dg Chest Port 1 View  Result Date: 09/25/2015 CLINICAL DATA:  82 year old male with shortness of breath. EXAM: PORTABLE CHEST 1 VIEW COMPARISON:  Chest radiograph dated 09/24/2015 FINDINGS: There is stable moderate cardiomegaly with minimal central vascular prominence. There is no focal consolidation, pleural effusion, or pneumothorax. Right apical pleural thickening. Left pectoral AICD device and median sternotomy wires noted. There is osteopenia with degenerative changes of the spine. Bilateral shoulder arthroplasty. No acute fracture. IMPRESSION: Cardiomegaly with probable mild congestive changes. No focal consolidation or edema. Electronically Signed   By: Anner Crete M.D.   On: 09/25/2015 02:59   Dg Chest Port 1 View  Result Date: 09/24/2015 CLINICAL DATA:  Dyspnea EXAM: PORTABLE CHEST 1 VIEW COMPARISON:  08/12/2015 FINDINGS: Cardiomegaly again noted. Status post median sternotomy. 3 leads cardiac pacemaker is unchanged in position. No infiltrate or pulmonary edema. Bilateral shoulder prosthesis again noted. IMPRESSION: Cardiomegaly. No active disease. Cardiac pacemaker unchanged in position. Electronically Signed   By: Lahoma Crocker M.D.   On: 09/24/2015 20:24    ASSESSMENT AND PLAN: 72 with history of CAD s/p CABG, ischemic cardiomyopathy s/p [employer] CRT-D, paroxysmal atrial fibrillation, CKD admitted with VT and ICD discharge. At [facility], he developed progressive volume overload and renal dysfunction, had VT again with dopamine use.  Transferred to [facility].   1. VT: Appropriate ICD discharge prior to admission. Had another VT episode in hospital on dopamine, terminated with ATP. Do not think this was triggered by ACS, likely scar-mediated. No chest pain. K 3.5 and Mag 1.9 .    - Amio and milrinone gtts have been  stopped and Palliative Care now following for comfort care.  2. CAD: s/p CABG. Had [place]ar in 7/17 with no options for revascularization. Would not repeat cath (especially with AKI). Elevated troponin at admission may be demand ischemia with volume overload and defibrillation.   3. Acute on chronic systolic CHF: EF 94-49%, moderate MR, moderately decreased RV systolic function, moderate TR on 7/17 echo, ischemic cardiomyopathy. [employer] CRT-D.  RHC in 7/17 showed low cardiac output and elevated filling pressures (PA 55/31, mean PCWP 32, CI 1.6). Yesterday he was transitioned form torsemide back to IV lasix.  UOP poof and only 575cc out yesterday.  - Continue 160 mg IV lasix three times a day  - Milrinone has been stopped and now comfort care.  - In absence of renal recovery, prognosis poor. Not a candidate for HD.  - Continue ted hose   4. AKI on CKD stage III: Most likely hemodynamic => suspect ATN event in the setting of initial cardiac arrest with hypotension. Renal following (  appreciate), not a good long-term HD candidate.    -  Creatinine trending up-->4.2>4.29 -->4.73.  BMET pending this am.   5. Atrial fibrillation: History of PAF, has not been anticoagulated however.  Family met with Palliative Care yesterday and now on 6N.  He is DNR.  Milrinone now off and AICD deactivated.  Family seeking residential hospice in [place] if deemed appropriate on Monday September 25.    Fransico Him, MD  10/10/2015  9:25 AM

## 2015-10-10 NOTE — Progress Notes (Addendum)
CM spoke with daughter Judeth Cornfield Banker) who states she has only one question, "If we go home with home hospice will hospice be with pt 24/7?" and of course, my answer is no.  CM explained the day to day care of the pt is the responsibility of the family; I can give her a private duty agency list but Judeth Cornfield was hoping for insurance covered care.  Judeth Cornfield states she understands and please contact CSW for placement.  Judeth Cornfield is going to go to [facility] and please have the CSW call the family 10/11/15.  CM has left this message on CSW phone and will follow tomorrow 10/11/15 to ensure CSW is arranging.

## 2015-10-10 NOTE — Progress Notes (Signed)
Scheduled ativan not given per family request, pt very lethargic at this time. Pt to receive midnight dose as scheduled

## 2015-10-11 MED ORDER — FUROSEMIDE 10 MG/ML IJ SOLN: 160.0000 mg | Freq: Three times a day (TID) | INTRAVENOUS | 12 refills | Status: AC

## 2015-10-11 MED ORDER — BISACODYL 10 MG RE SUPP: 10.0000 mg | Freq: Every day | RECTAL | 0 refills | Status: AC | PRN

## 2015-10-11 MED ORDER — ARFORMOTEROL TARTRATE 15 MCG/2ML IN NEBU: 15.0000 ug | INHALATION_SOLUTION | Freq: Two times a day (BID) | RESPIRATORY_TRACT | 0 refills | Status: AC

## 2015-10-11 MED ORDER — LORAZEPAM 1 MG PO TABS: 1.0000 mg | ORAL_TABLET | ORAL | 0 refills | Status: AC | PRN

## 2015-10-11 MED ORDER — UMECLIDINIUM BROMIDE 62.5 MCG/INH IN AEPB: 1.0000 | INHALATION_SPRAY | Freq: Every day | RESPIRATORY_TRACT | 0 refills | Status: AC

## 2015-10-11 MED ORDER — FENTANYL CITRATE (PF) 2500 MCG/50ML IJ SOLN: 25.0000 ug/h | INTRAMUSCULAR | 0 refills | Status: AC

## 2015-10-11 NOTE — Progress Notes (Signed)
SUBJECTIVE:  Denies any SOB or CP  OBJECTIVE:   Vitals:   Vitals:   10/09/15 1852 10/10/15 0600 10/10/15 0851 10/11/15 0500  BP: (!) 113/58 122/62  115/69  Pulse: 78 78  75  Resp:  18  16  Temp: 97.3 F (36.3 C) 97.6 F (36.4 C)  98.1 F (36.7 C)  TempSrc: Oral Oral  Oral  SpO2: 93% 90% 96% 92%  Weight:      Height:       I&O's:   Intake/Output Summary (Last 24 hours) at 10/11/15 1057 Last data filed at 10/11/15 0630  Gross per 24 hour  Intake           419.75 ml  Output              650 ml  Net          -230.25 ml   TELEMETRY: Reviewed telemetry pt in NSR:     PHYSICAL EXAM General: Well developed, well nourished, in no acute distress Head: Eyes PERRLA, No xanthomas.   Normal cephalic and atramatic  Lungs:   Clear bilaterally to auscultation and percussion. Heart:   HRRR S1 S2 Pulses are 2+ & equal. Abdomen: Bowel sounds are positive, abdomen soft and non-tender without masses  Msk:  Back normal, normal gait. Normal strength and tone for age. Extremities:   No clubbing, cyanosis or edema.  DP +1 Neuro: Alert and oriented X 3. Psych:  Good affect, responds appropriately   LABS: Basic Metabolic Panel:  Recent Labs  10/09/15 0500  NA 130*  K 3.5  CL 92*  CO2 24  GLUCOSE 90  BUN 93*  CREATININE 4.73*  CALCIUM 8.2*  MG 2.0  PHOS 3.8   Liver Function Tests:  Recent Labs  10/09/15 0500  ALBUMIN 2.7*   No results for input(s): LIPASE, AMYLASE in the last 72 hours. CBC:  Recent Labs  10/09/15 0500  WBC 6.0  HGB 7.9*  HCT 23.4*  MCV 86.0  PLT 93*   Cardiac Enzymes: No results for input(s): CKTOTAL, CKMB, CKMBINDEX, TROPONINI in the last 72 hours. BNP: Invalid input(s): POCBNP D-Dimer: No results for input(s): DDIMER in the last 72 hours. Hemoglobin A1C: No results for input(s): HGBA1C in the last 72 hours. Fasting Lipid Panel: No results for input(s): CHOL, HDL, LDLCALC, TRIG, CHOLHDL, LDLDIRECT in the last 72 hours. Thyroid Function  Tests: No results for input(s): TSH, T4TOTAL, T3FREE, THYROIDAB in the last 72 hours.  Invalid input(s): FREET3 Anemia Panel: No results for input(s): VITAMINB12, FOLATE, FERRITIN, TIBC, IRON, RETICCTPCT in the last 72 hours. Coag Panel:   Lab Results  Component Value Date   INR 1.51 09/24/2015    RADIOLOGY: US Renal  Result Date: 09/27/2015 CLINICAL DATA:  Acute renal failure. EXAM: RENAL / URINARY TRACT ULTRASOUND COMPLETE COMPARISON:  08/07/2015 FINDINGS: Right Kidney: Length: 9.9 cm. Cortical thinning noted with slight increased cortical echogenicity. No hydronephrosis. Left Kidney: Length: 11.5 cm multiple simple appearing cysts are identified, as before. No hydronephrosis. Echogenicity within normal limits. No mass or hydronephrosis visualized. Bladder: Appears normal for degree of bladder distention. Other:  Small volume ascites noted with 2.8 cm hepatic cyst evident. IMPRESSION: No evidence for hydronephrosis. Electronically Signed   By: Misty Stanley M.D.   On: 09/27/2015 10:30   Dg Chest Port 1 View  Result Date: 09/30/2015 CLINICAL DATA:  Central line placement EXAM: PORTABLE CHEST 1 VIEW COMPARISON:  09/25/2015 FINDINGS: Left pacer remains in place, unchanged. Interval placement of  left internal jugular central line. The tip projects superiorly into the lateral wall of the SVC or possibly into the lower right innominate vein. No pneumothorax. There is cardiomegaly with vascular congestion. Bibasilar atelectasis and small effusions. IMPRESSION: Left PICC line tip projects superiorly into the lateral wall of the SVC or possibly into the lower portion of the right innominate vein. Cardiomegaly with vascular congestion. Small effusions with bibasilar atelectasis. Electronically Signed   By: Rolm Baptise M.D.   On: 09/30/2015 14:10   Dg Chest Port 1 View  Result Date: 09/25/2015 CLINICAL DATA:  82 year old male with shortness of breath. EXAM: PORTABLE CHEST 1 VIEW COMPARISON:  Chest  radiograph dated 09/24/2015 FINDINGS: There is stable moderate cardiomegaly with minimal central vascular prominence. There is no focal consolidation, pleural effusion, or pneumothorax. Right apical pleural thickening. Left pectoral AICD device and median sternotomy wires noted. There is osteopenia with degenerative changes of the spine. Bilateral shoulder arthroplasty. No acute fracture. IMPRESSION: Cardiomegaly with probable mild congestive changes. No focal consolidation or edema. Electronically Signed   By: Anner Crete M.D.   On: 09/25/2015 02:59   Dg Chest Port 1 View  Result Date: 09/24/2015 CLINICAL DATA:  Dyspnea EXAM: PORTABLE CHEST 1 VIEW COMPARISON:  08/12/2015 FINDINGS: Cardiomegaly again noted. Status post median sternotomy. 3 leads cardiac pacemaker is unchanged in position. No infiltrate or pulmonary edema. Bilateral shoulder prosthesis again noted. IMPRESSION: Cardiomegaly. No active disease. Cardiac pacemaker unchanged in position. Electronically Signed   By: Lahoma Crocker M.D.   On: 09/24/2015 20:24    ASSESSMENT AND PLAN: 82 with history of CAD s/p CABG, ischemic cardiomyopathy s/p [employer] CRT-D, paroxysmal atrial fibrillation, CKD admitted with VT and ICD discharge. At [facility], he developed progressive volume overload and renal dysfunction, had VT again with dopamine use. Transferred to [facility].   1. VT: Appropriate ICD discharge prior to admission. Had another VT episode in hospital on dopamine, terminated with ATP. Do not think this was triggered by ACS, likely scar-mediated. No chest pain. K 3.5and Mag 1.9 .  - Amio and milrinone gtts have been stopped and Palliative Care now following for comfort care.  2. CAD: s/p CABG. Had T[place]ov in 7/17 with no options for revascularization. Would not repeat cath (especially with AKI). Elevated troponin at admission may be demand ischemia with volume overload and defibrillation.   3. Acute on chronic systolic CHF: EF 12-45%,  moderate MR, moderately decreased RV systolic function, moderate TR on 7/17 echo, ischemic cardiomyopathy. [employer] CRT-D. RHC in 7/17 showed low cardiac output and elevated filling pressures (PA 55/31, mean PCWP 32, CI 1.6). He has been transitioned from torsemide back to IV lasix. UOP poor and only 575cc out yesterday.  - Continue 160 mg IV lasix three times a day  - Milrinone has been stopped and now comfort care. - In absence of renal recovery, prognosis poor. Not a candidate for HD.  - Continue ted hose   4. AKI on CKD stage III: Most likely hemodynamic =>suspect ATN event in the setting of initial cardiac arrest with hypotension. Renal following (appreciate), not a good long-term HD candidate.   -  Creatinine trending up-->4.2>4.29 -->4.73. No more blood draws.  5. Atrial fibrillation: History of PAF, has not been anticoagulated however.  Family met with Palliative Care  and now on 6N.  He is DNR.  Milrinone now off and AICD deactivated.  Family seeking residential hospice in [place] on Monday September 25.    Fransico Him, MD  10/11/2015  10:57 AM

## 2015-10-11 NOTE — Progress Notes (Signed)
Faxed DC summary to [facility], Eunice Blase will call me back once she receives it and I can call [facility].

## 2015-10-11 NOTE — Progress Notes (Signed)
[facility] Note Pt is comfort  care, with plans for residential hospice. No role for nephrology. Social visit.  Camille Bal, MD [facility] [phone] Pager 10/11/2015, 7:37 AM

## 2015-10-11 NOTE — Clinical Social Work Note (Signed)
CSW spoke with MD and notified that pt could be discharged today. CSW spoke with RN and requested RN give report and contact [facility] for pt to be transported to [facility]. CSW signing off.

## 2015-10-11 NOTE — Clinical Social Work Note (Signed)
CSW notified by director of hospice [facility] of [facility] that pt's family toured facility and agreed for pt to discharge to facility. CSW notified MD that [facility] confirmed that they are able to admit pt and that they need discharge summary and orders from MD. CSW will arrange for [facility] to transport once MD clears pt for discharge.

## 2015-10-11 NOTE — Progress Notes (Signed)
Wasted100 cc fentanyl drip from IV bag when pt transported and witnessed by JenniferMcBride, RN.

## 2015-10-11 NOTE — Progress Notes (Signed)
Obtained gold DNR form and coordinated DC. Called [facility] and let them know pt ready for transport.

## 2015-10-11 NOTE — Discharge Summary (Signed)
Discharge Summary    Patient ID: Tom Nguyen,  MRN: [id number], DOB/AGE: 80-Oct-1934 80 y.o.  Admit date: 09/30/2015 Discharge date: 10/11/2015  Primary Care Provider: Olevia Perches Primary Cardiologist: Dr. Shirlee Latch  Discharge Diagnoses    Principal Problem:   Acute on chronic systolic (congestive) heart failure [facility]) Active Problems:   Cardiomyopathy, ischemic   Acute renal failure (HCC)   Ventricular tachycardia (HCC)   Cardiogenic shock (HCC)   History of Present Illness     Tom Nguyen is a 82 y.o. male with past medical history of CAD (s/p CABG in 1995, cath in 07/2015 with no options for revascularization), chronic combined CHF (NYHA class 3/ICM with EF of 25-30% by echo, s/p [employer] Bi-V ICD), underlying LBBB, chronic respiratory failure 2/2 COPD with long history of tobacco abuse, and OSA (on CPAP)who presented to [facility] on 09/24/2015 with chest pain, SOB, diaphoresis, and palpitations found to be in symptomatic sustained VT upon arrival to the ED.   He underwent defibrillation at 150J with conversion to NSR. Electrolytes were within normal limits. CXR showed cardiomegaly with no active disease. He was started on Heparin and IV Amiodarone at time of admission.  While at [facility], his creatinine continued to trend upwards despite significant fluid overload. He was therefore transferred to the Heart Failure service at [facility] for further management on 09/30/2015.   Hospital Course     Consultants: Nephrology, Palliative Care  He was started on IV Bumetanide and Milrinone with minimal urine output. Creatinine remained stable at 3.8 - 3.9. Bumetanide was switched to Lasix with some improvement in his urine output. Nephrology's assistance was greatly appreciated in monitoring diuretic dosing.  Creatinine continued to trend upwards to 4.2 on 9/20 and he was deemed not a candidate for HD, not does he want to undergo HD. Creatinine was at 4.73 on 9/22.  Palliative Care  was consulted in regards to goals of care. They reviewed the course of his condition and how he had not responded well to aggressive treatment. Comfort measures were reviewed with the patient and his family and they wished to pursue this. He elected to be DNR and his AICD was deactivated.   Case Management has reached out to [facility] and they are accepting him in transfer today. Orders for comfort medications he is on currently will be provided.   _____________  Discharge Vitals Blood pressure 115/69, pulse 75, temperature 98.1 F (36.7 C), temperature source Oral, resp. rate 16, height 5\' 8"  (1.727 m), weight 180 lb 12.4 oz (82 kg), SpO2 92 %.  Filed Weights   10/07/15 0400 10/08/15 0431 10/09/15 0500  Weight: 176 lb 2.4 oz (79.9 kg) 173 lb 8 oz (78.7 kg) 180 lb 12.4 oz (82 kg)    Labs & Radiologic Studies     CBC  Recent Labs  10/09/15 0500  WBC 6.0  HGB 7.9*  HCT 23.4*  MCV 86.0  PLT 93*   Basic Metabolic Panel  Recent Labs  10/09/15 0500  NA 130*  K 3.5  CL 92*  CO2 24  GLUCOSE 90  BUN 93*  CREATININE 4.73*  CALCIUM 8.2*  MG 2.0  PHOS 3.8   Liver Function Tests  Recent Labs  10/09/15 0500  ALBUMIN 2.7*   No results for input(s): LIPASE, AMYLASE in the last 72 hours. Cardiac Enzymes No results for input(s): CKTOTAL, CKMB, CKMBINDEX, TROPONINI in the last 72 hours. BNP Invalid input(s): POCBNP D-Dimer No results for input(s): DDIMER in  the last 72 hours. Hemoglobin A1C No results for input(s): HGBA1C in the last 72 hours. Fasting Lipid Panel No results for input(s): CHOL, HDL, LDLCALC, TRIG, CHOLHDL, LDLDIRECT in the last 72 hours. Thyroid Function Tests No results for input(s): TSH, T4TOTAL, T3FREE, THYROIDAB in the last 72 hours.  Invalid input(s): FREET3  US Renal  Result Date: 09/27/2015 CLINICAL DATA:  Acute renal failure. EXAM: RENAL / URINARY TRACT ULTRASOUND COMPLETE COMPARISON:  08/07/2015 FINDINGS: Right Kidney:  Length: 9.9 cm. Cortical thinning noted with slight increased cortical echogenicity. No hydronephrosis. Left Kidney: Length: 11.5 cm multiple simple appearing cysts are identified, as before. No hydronephrosis. Echogenicity within normal limits. No mass or hydronephrosis visualized. Bladder: Appears normal for degree of bladder distention. Other:  Small volume ascites noted with 2.8 cm hepatic cyst evident. IMPRESSION: No evidence for hydronephrosis. Electronically Signed   By: Kennith Center M.D.   On: 09/27/2015 10:30   Dg Chest Port 1 View  Result Date: 09/30/2015 CLINICAL DATA:  Central line placement EXAM: PORTABLE CHEST 1 VIEW COMPARISON:  09/25/2015 FINDINGS: Left pacer remains in place, unchanged. Interval placement of left internal jugular central line. The tip projects superiorly into the lateral wall of the SVC or possibly into the lower right innominate vein. No pneumothorax. There is cardiomegaly with vascular congestion. Bibasilar atelectasis and small effusions. IMPRESSION: Left PICC line tip projects superiorly into the lateral wall of the SVC or possibly into the lower portion of the right innominate vein. Cardiomegaly with vascular congestion. Small effusions with bibasilar atelectasis. Electronically Signed   By: Charlett Nose M.D.   On: 09/30/2015 14:10   Dg Chest Port 1 View  Result Date: 09/25/2015 CLINICAL DATA:  82 year old male with shortness of breath. EXAM: PORTABLE CHEST 1 VIEW COMPARISON:  Chest radiograph dated 09/24/2015 FINDINGS: There is stable moderate cardiomegaly with minimal central vascular prominence. There is no focal consolidation, pleural effusion, or pneumothorax. Right apical pleural thickening. Left pectoral AICD device and median sternotomy wires noted. There is osteopenia with degenerative changes of the spine. Bilateral shoulder arthroplasty. No acute fracture. IMPRESSION: Cardiomegaly with probable mild congestive changes. No focal consolidation or edema.  Electronically Signed   By: Elgie Collard M.D.   On: 09/25/2015 02:59   Dg Chest Port 1 View  Result Date: 09/24/2015 CLINICAL DATA:  Dyspnea EXAM: PORTABLE CHEST 1 VIEW COMPARISON:  08/12/2015 FINDINGS: Cardiomegaly again noted. Status post median sternotomy. 3 leads cardiac pacemaker is unchanged in position. No infiltrate or pulmonary edema. Bilateral shoulder prosthesis again noted. IMPRESSION: Cardiomegaly. No active disease. Cardiac pacemaker unchanged in position. Electronically Signed   By: Natasha Mead M.D.   On: 09/24/2015 20:24     Diagnostic Studies/Procedures    None Performed.   Disposition   Pt is being discharged home today in good condition.  Follow-up Plans & Appointments       Discharge Medications     Medication List    STOP taking these medications   albuterol 108 (90 Base) MCG/ACT inhaler Commonly known as:  PROVENTIL HFA;VENTOLIN HFA   alendronate 70 MG tablet Commonly known as:  FOSAMAX   amiodarone 400 MG tablet Commonly known as:  PACERONE   carvedilol 3.125 MG tablet Commonly known as:  COREG   clopidogrel 75 MG tablet Commonly known as:  PLAVIX   folic acid 1 MG tablet Commonly known as:  FOLVITE   furosemide 40 MG tablet Commonly known as:  LASIX   hydroxychloroquine 200 MG tablet Commonly known as:  PLAQUENIL  levothyroxine 75 MCG tablet Commonly known as:  SYNTHROID, LEVOTHROID   MULTIVITAMIN ADULT PO   simvastatin 40 MG tablet Commonly known as:  ZOCOR     TAKE these medications   acetaminophen 500 MG tablet Commonly known as:  TYLENOL Take 500 mg by mouth every 6 (six) hours as needed.   allopurinol 100 MG tablet Commonly known as:  ZYLOPRIM Take 100 mg by mouth daily.   arformoterol 15 MCG/2ML Nebu Commonly known as:  BROVANA Take 2 mLs (15 mcg total) by nebulization 2 (two) times daily.   bisacodyl 10 MG suppository Commonly known as:  DULCOLAX Place 1 suppository (10 mg total) rectally daily as needed for  moderate constipation.   fentaNYL 2,500 mcg in sodium chloride 0.9 % 200 mL Inject 25 mcg/hr into the vein continuous.   furosemide 160 mg in dextrose 5 % 50 mL Inject 160 mg into the vein every 8 (eight) hours.   leflunomide 20 MG tablet Commonly known as:  ARAVA Take 20 mg by mouth daily.   LORazepam 1 MG tablet Commonly known as:  ATIVAN Take 1 tablet (1 mg total) by mouth every 4 (four) hours as needed for anxiety.   montelukast 10 MG tablet Commonly known as:  SINGULAIR Take 10 mg by mouth daily.   nitroGLYCERIN 0.4 MG/SPRAY spray Commonly known as:  NITROLINGUAL Place 1 spray under the tongue every 5 (five) minutes x 3 doses as needed for chest pain.   omeprazole 20 MG capsule Commonly known as:  PRILOSEC Take 1 capsule (20 mg total) by mouth daily.   umeclidinium bromide 62.5 MCG/INH Aepb Commonly known as:  INCRUSE ELLIPTA Inhale 1 puff into the lungs daily. Start taking on:  10/12/2015   umeclidinium-vilanterol 62.5-25 MCG/INH Aepb Commonly known as:  ANORO ELLIPTA Inhale 1 puff into the lungs daily.        Allergies No Known Allergies   Outstanding Labs/Studies   None - Discharging to [facility]   Duration of Discharge Encounter   Greater than 30 minutes including physician time.  Signed, Ellsworth Lennox, PA-C 10/11/2015, 4:48 PM

## 2015-10-11 NOTE — Progress Notes (Signed)
Notified Palliative care Dr of plan for pt moving to [facility] today.

## 2015-10-11 NOTE — Progress Notes (Signed)
Report given to [facility] residential hospice.

## 2015-10-11 NOTE — Progress Notes (Signed)
CSW called and told that the residential hospice has availability, to call them.

## 2015-10-11 NOTE — Progress Notes (Signed)
Texted Card for DC summary.

## 2015-10-15 ENCOUNTER — Telehealth: Payer: Self-pay | Admitting: Family Medicine

## 2015-10-15 ENCOUNTER — Ambulatory Visit: Payer: Self-pay | Admitting: Family Medicine

## 2015-10-18 NOTE — Telephone Encounter (Signed)
[facility] called to report that the patient passed away on 10/20/2015 @ 5:35am. They wanted to inform you. Thanks.

## 2015-10-18 DEATH — deceased

## 2015-10-19 ENCOUNTER — Ambulatory Visit: Payer: Medicare Other | Admitting: Cardiovascular Disease

## 2015-11-24 ENCOUNTER — Encounter: Payer: Medicare Other | Admitting: Internal Medicine

## 2016-02-12 ENCOUNTER — Other Ambulatory Visit: Payer: Self-pay | Admitting: Internal Medicine

## 2017-06-20 IMAGING — CR DG CHEST 2V
1 series · 2 of 2 positions shown · non-contrast
Comparison: None

CLINICAL DATA: Shortness of breath, COPD and CHF. History of
coronary artery disease and cardiomyopathy.

EXAM:
CHEST - 2 VIEW

[Series 1: dg chest 2 view · 0.14mm/px · 2 of 2 slices shown]
[im 1/2]
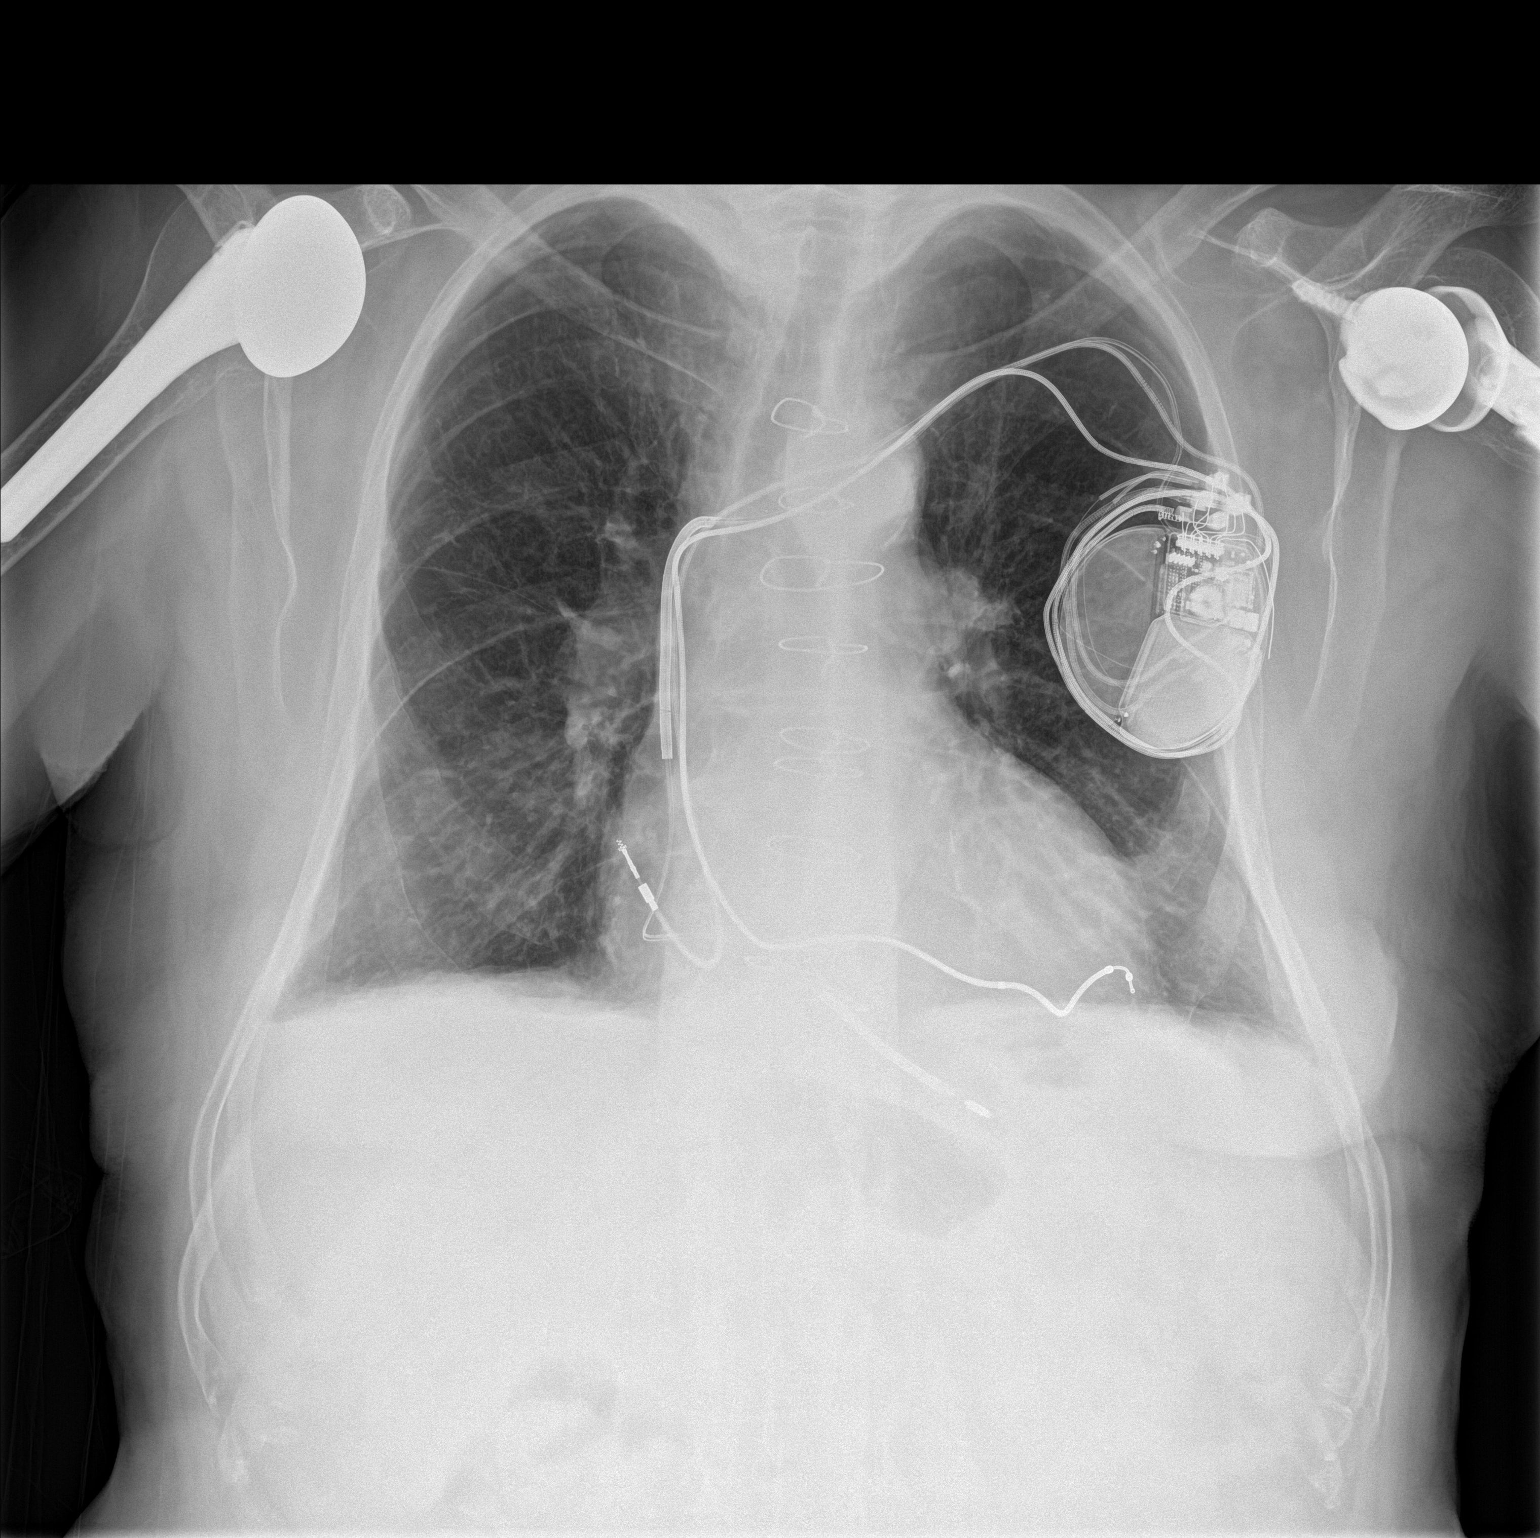
[im 2/2]
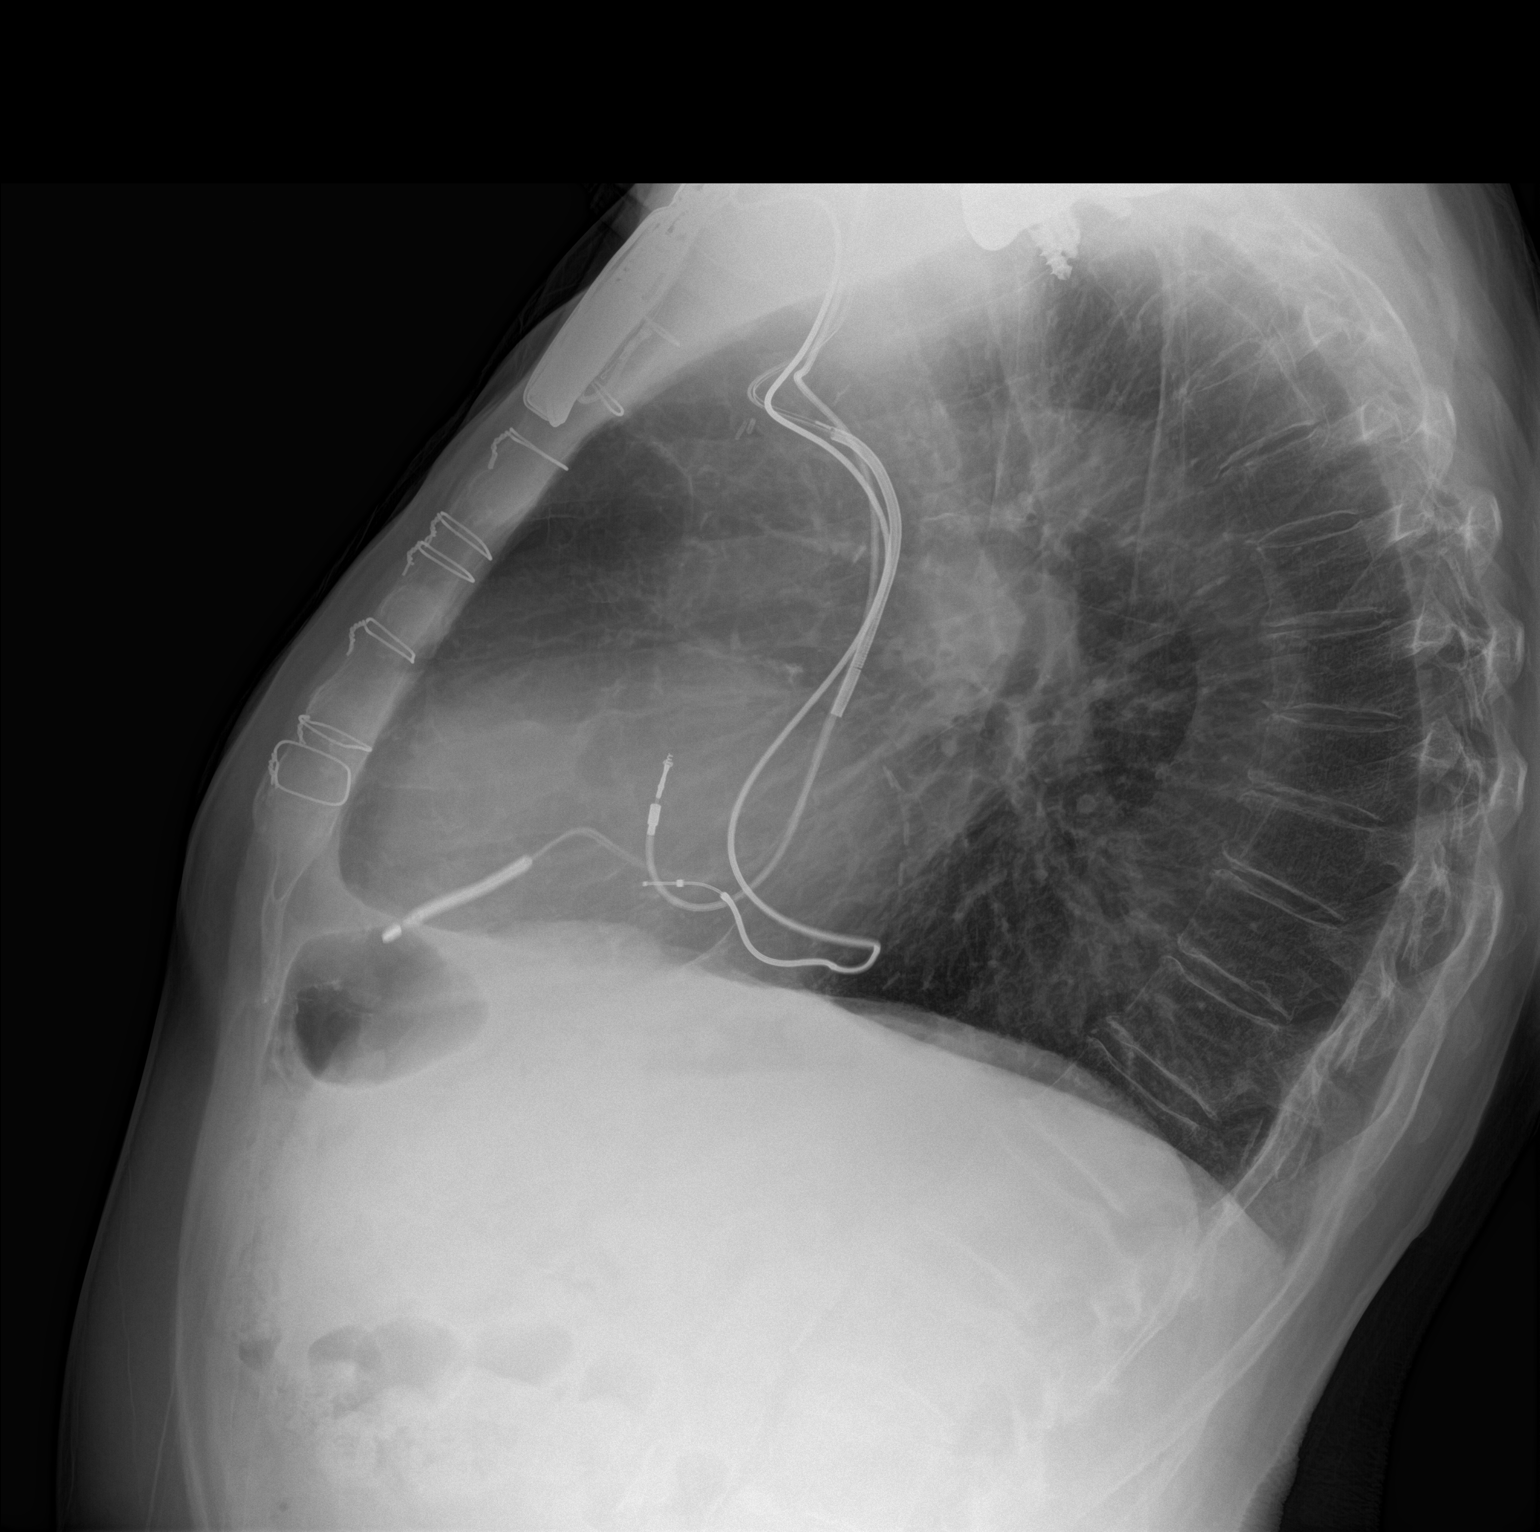

[2 of 2 positions shown; findings below may reference images not displayed]

FINDINGS: There is evidence of prior CABG. The heart size is normal. A
biventricular pacing/ICD device is present. There is no evidence of
pulmonary edema, consolidation, pneumothorax, nodule or pleural
fluid. Bones are osteopenic. There are bilateral shoulder
arthroplasties.
IMPRESSION: No acute findings.

## 2017-06-22 IMAGING — CR DG THORACIC SPINE 2V
3 series · 3 of 3 positions shown · non-contrast
Comparison: None.

CLINICAL DATA: Midline thoracic back pain without known injury.

EXAM:
THORACIC SPINE 2 VIEWS

[t-spine ap]
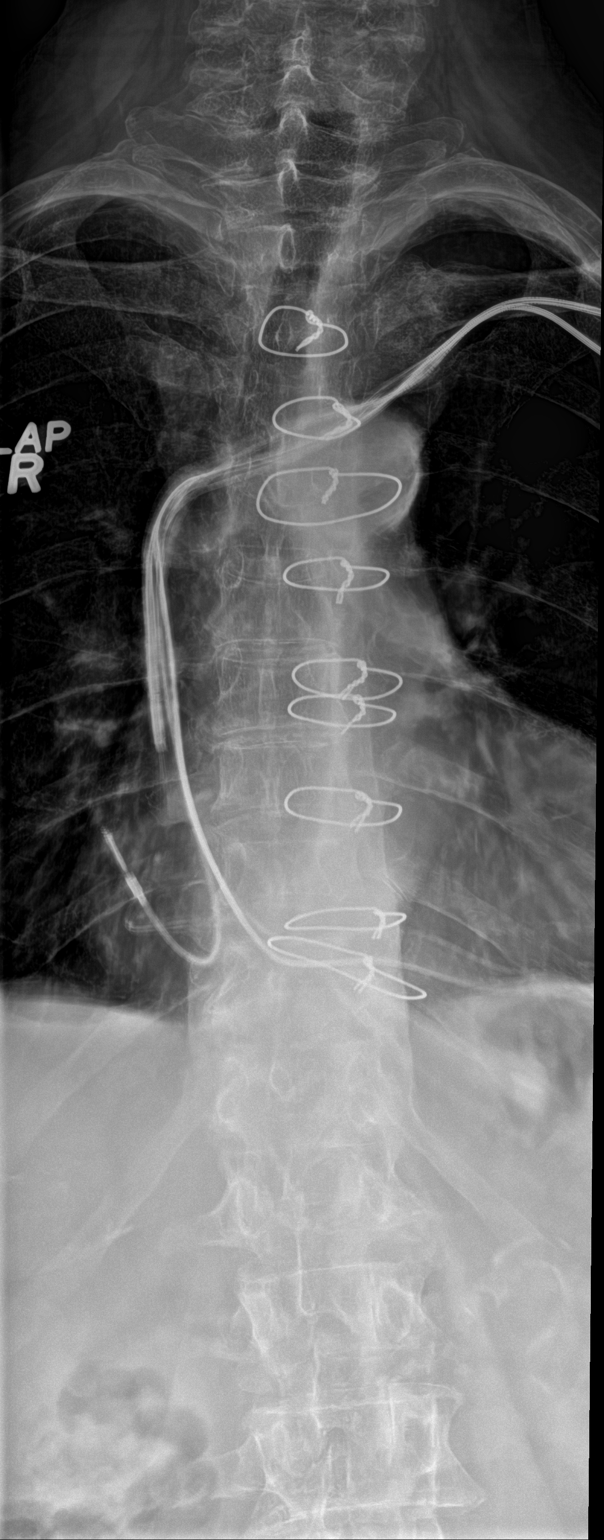

[t-spine lat]
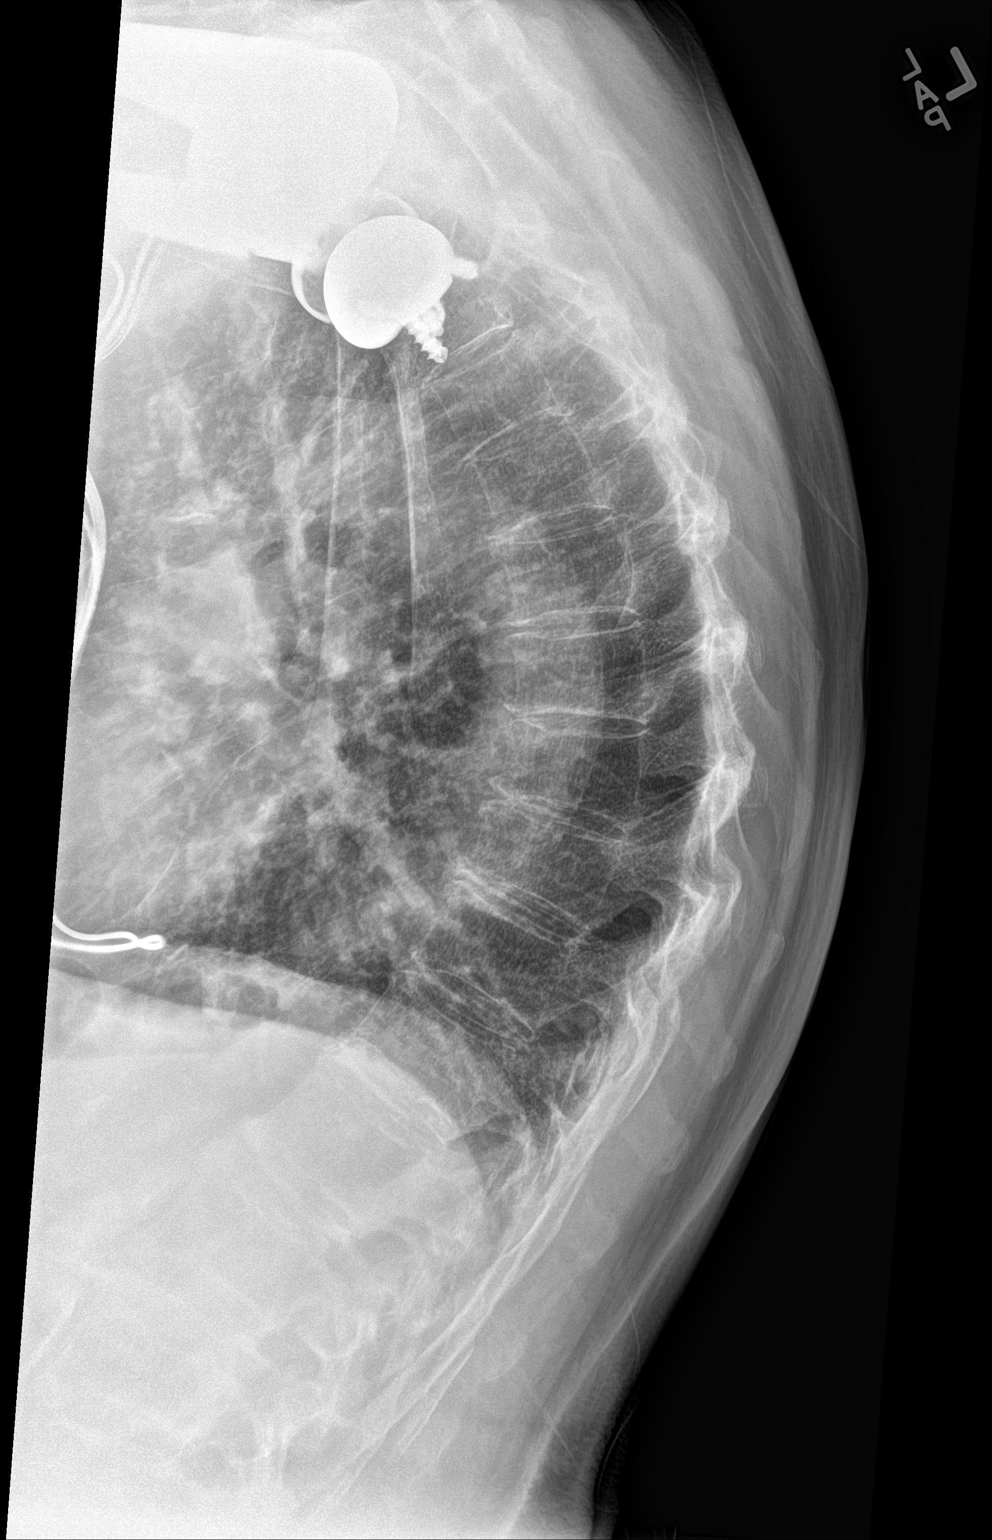

[t-spine swimmers]
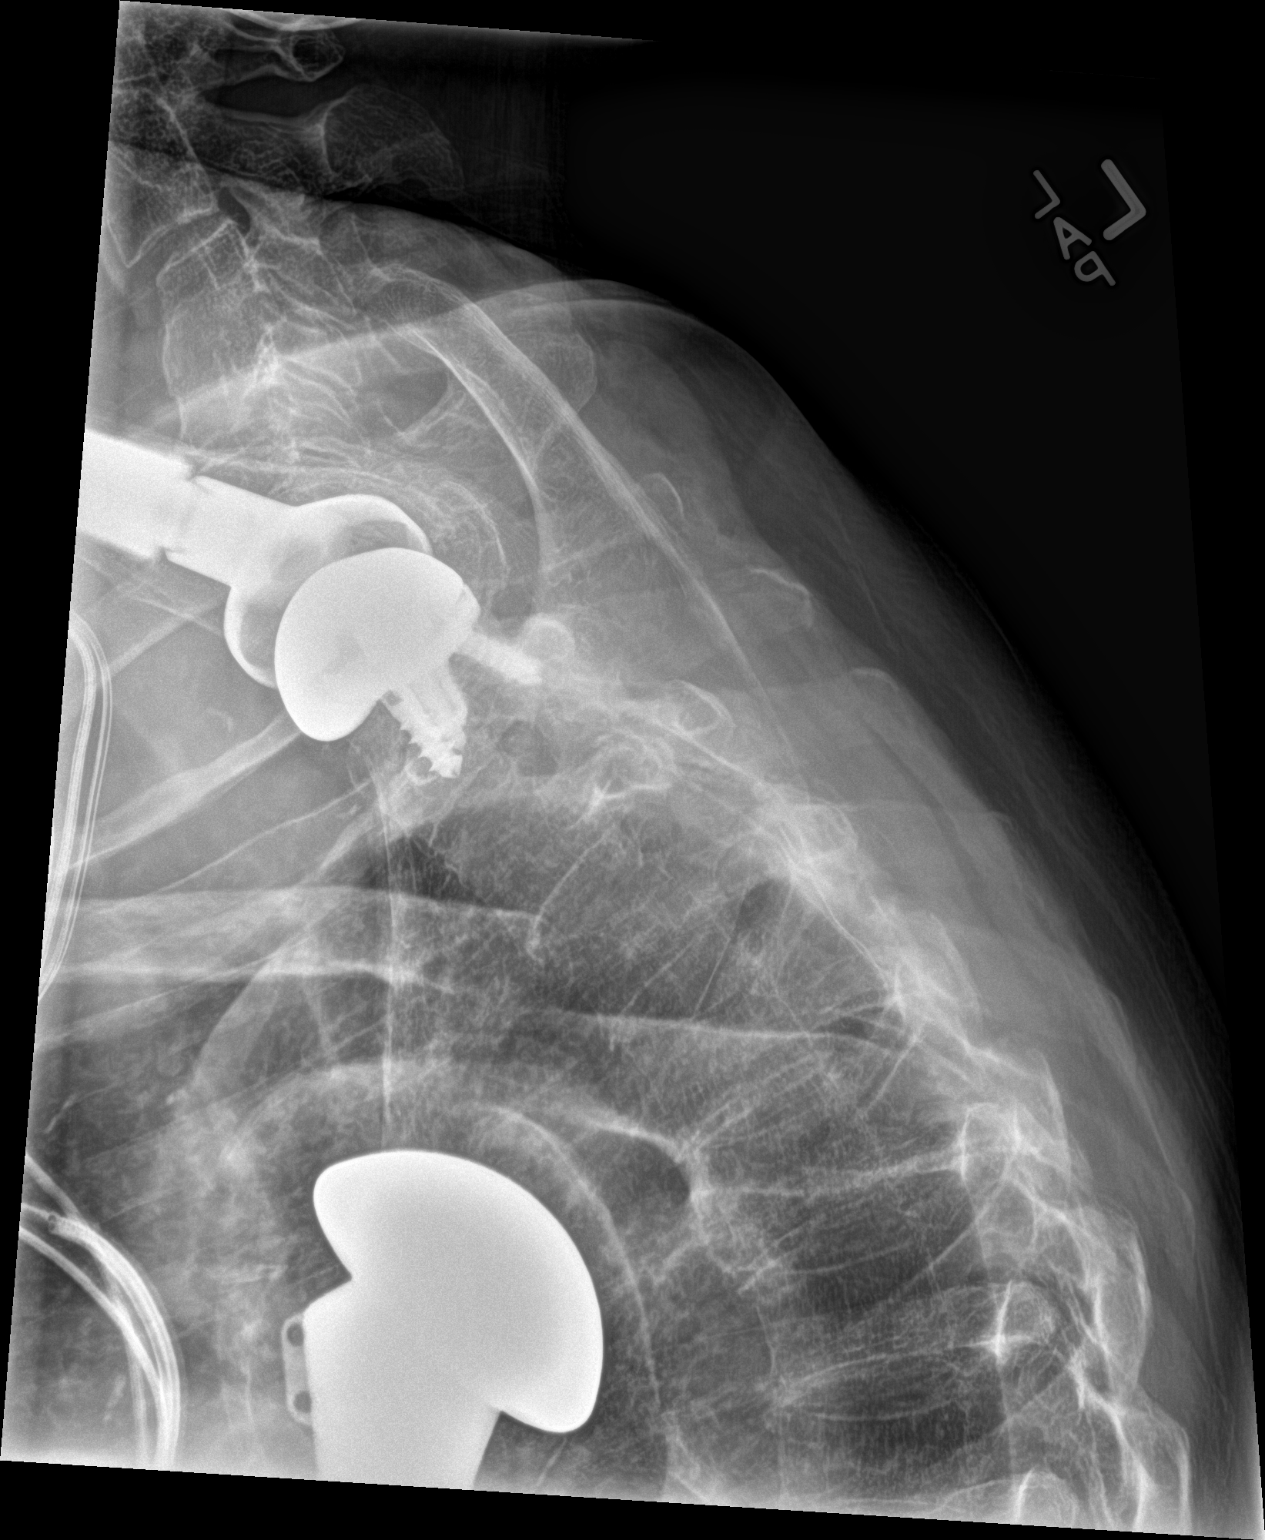

[3 of 3 positions shown; findings below may reference images not displayed]

FINDINGS: There is no evidence of fracture or spondylolisthesis. Mild
osteopenia is noted. Mild multilevel degenerative disc disease is
noted in the mid thoracic spine.
IMPRESSION: Mild multilevel degenerative disc disease is noted thoracic spine.
No acute abnormality seen.

## 2017-08-15 IMAGING — CR DG CHEST 2V
1 series · 2 of 2 positions shown · non-contrast
Comparison: 10/21/2014.

CLINICAL DATA: Productive cough.  Shortness of breath.

EXAM:
CHEST  2 VIEW

[Series 1: dg chest 2 view · 0.14mm/px · 2 of 2 slices shown]
[im 1/2]
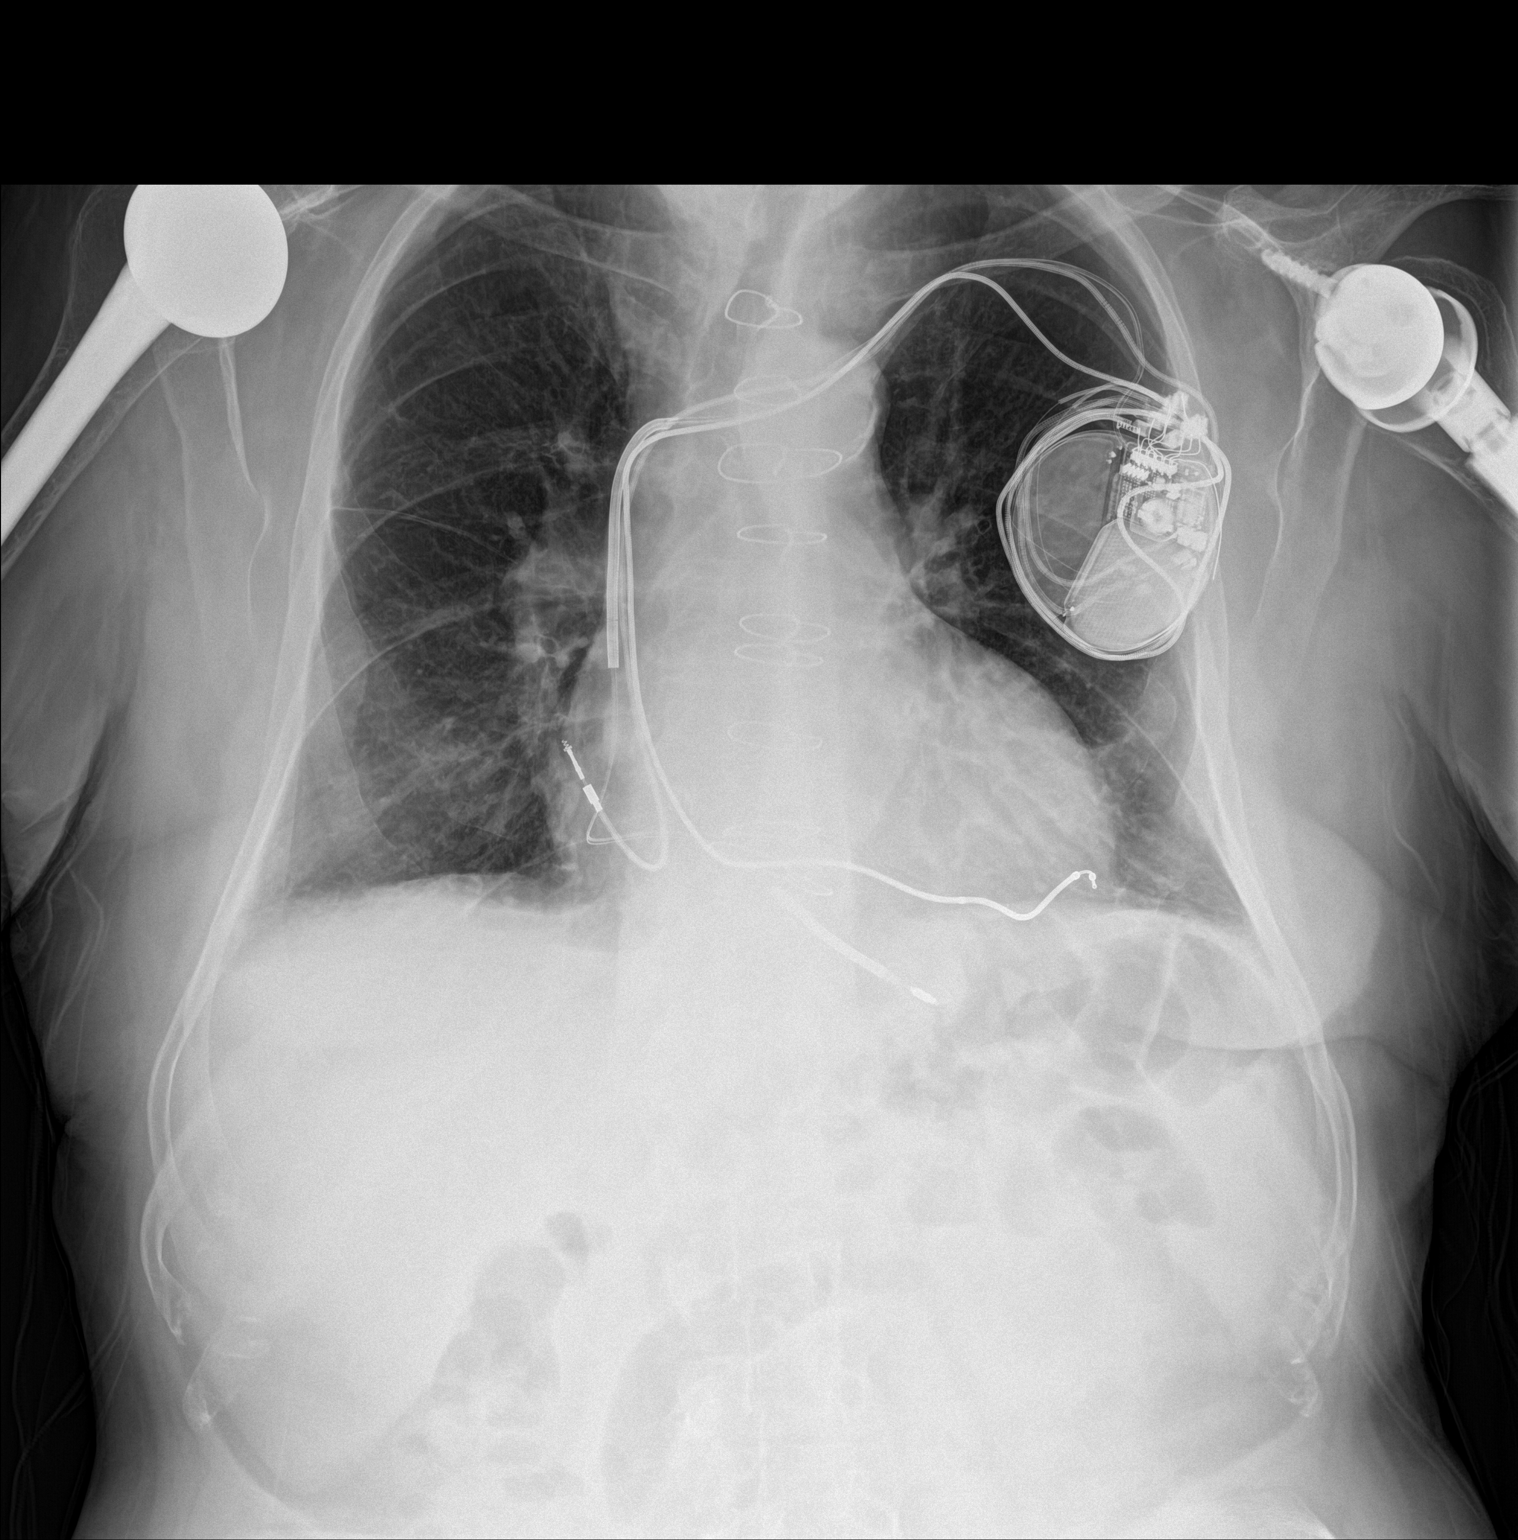
[im 2/2]
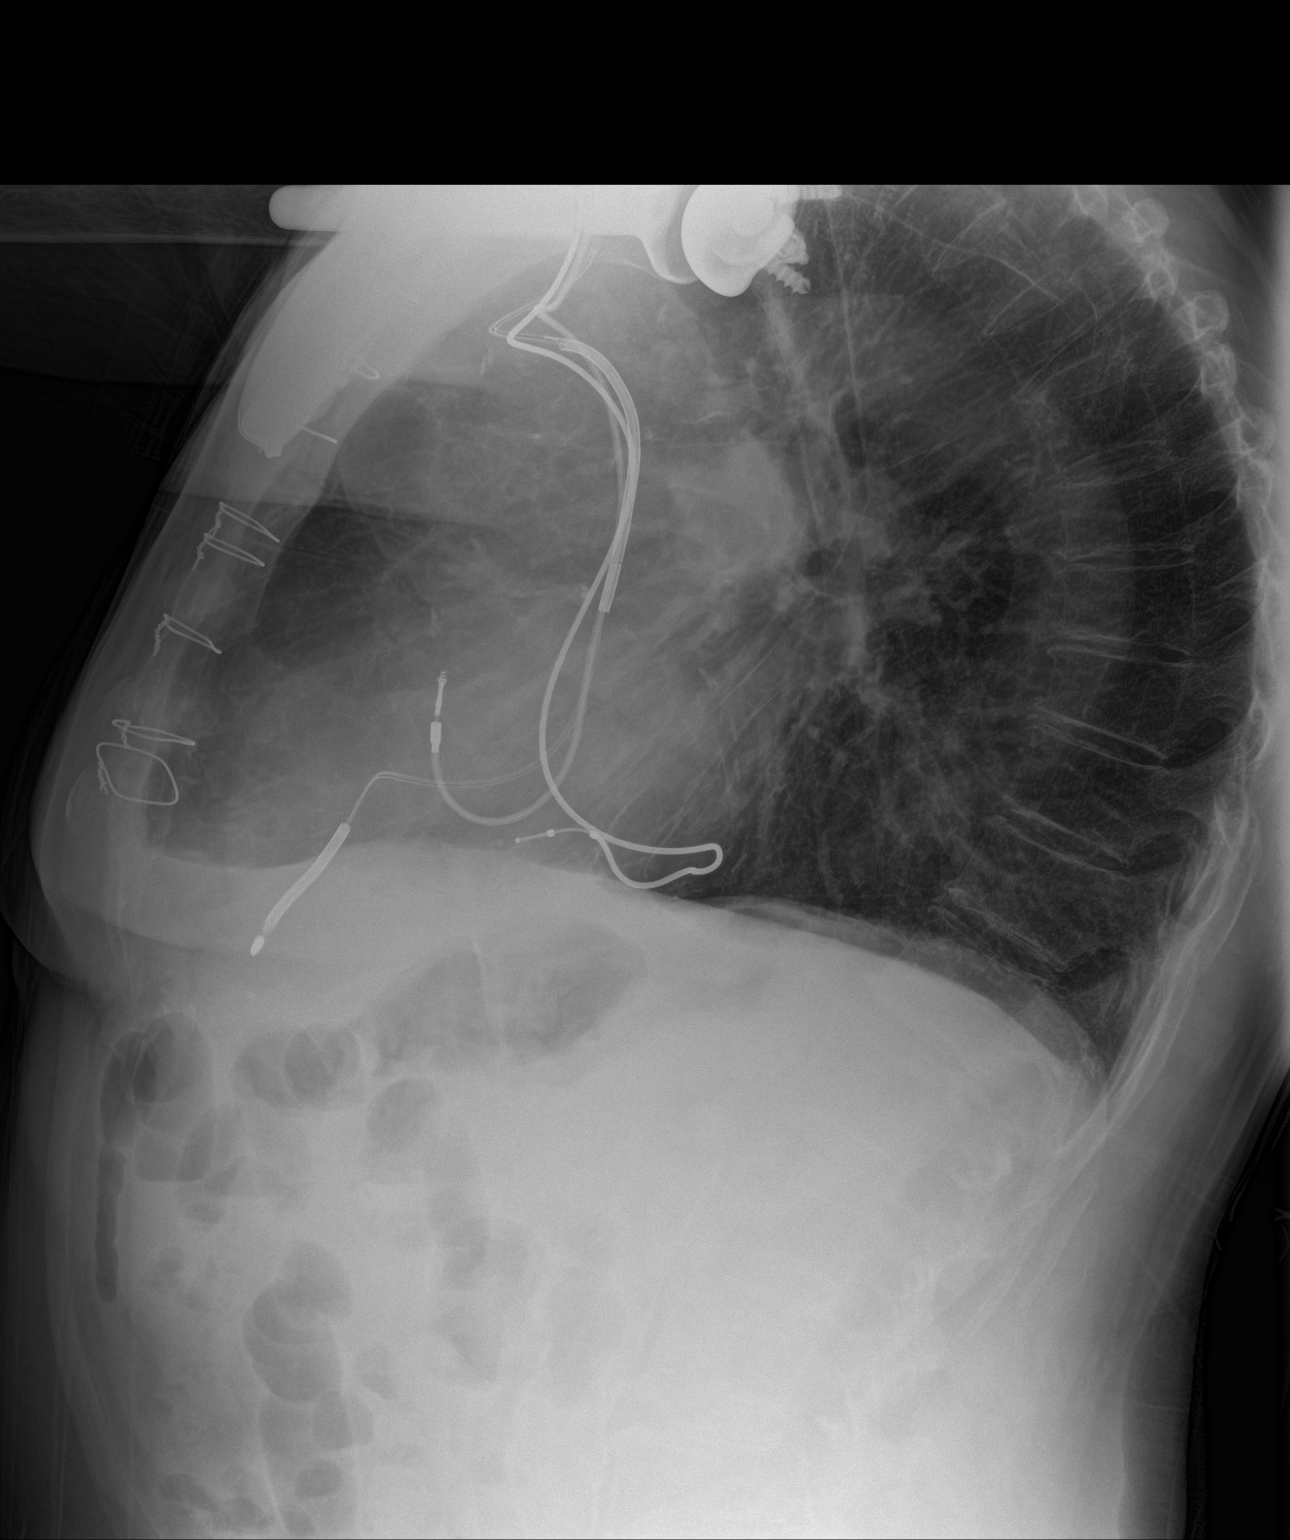

[2 of 2 positions shown; findings below may reference images not displayed]

FINDINGS: Mediastinum and hilar structures normal. Prior CABG. AICD in stable
position. Stable cardiomegaly with normal pulmonary vascularity .
Lung volumes with mild bibasilar atelectasis. No pleural effusion or
pneumothorax. Bilateral shoulder replacements.
IMPRESSION: 1. Prior CABG.  AICD in stable position.  Stable cardiomegaly.
2. Low lung volumes with mild bibasilar atelectasis.
